# Patient Record
Sex: Female | Born: 1945 | Race: White | Hispanic: No | Marital: Married | Smoking: Never smoker
Health system: Southern US, Community
[De-identification: ages and names within clinical notes are randomized; demographics above are authoritative.]

## PROBLEM LIST (undated history)

## (undated) DIAGNOSIS — K219 Gastro-esophageal reflux disease without esophagitis: Secondary | ICD-10-CM

## (undated) DIAGNOSIS — J309 Allergic rhinitis, unspecified: Secondary | ICD-10-CM

## (undated) DIAGNOSIS — G709 Myoneural disorder, unspecified: Secondary | ICD-10-CM

## (undated) DIAGNOSIS — E559 Vitamin D deficiency, unspecified: Secondary | ICD-10-CM

## (undated) DIAGNOSIS — M179 Osteoarthritis of knee, unspecified: Secondary | ICD-10-CM

## (undated) DIAGNOSIS — N189 Chronic kidney disease, unspecified: Secondary | ICD-10-CM

## (undated) DIAGNOSIS — G43909 Migraine, unspecified, not intractable, without status migrainosus: Secondary | ICD-10-CM

## (undated) DIAGNOSIS — E785 Hyperlipidemia, unspecified: Secondary | ICD-10-CM

## (undated) DIAGNOSIS — F329 Major depressive disorder, single episode, unspecified: Secondary | ICD-10-CM

## (undated) DIAGNOSIS — M810 Age-related osteoporosis without current pathological fracture: Secondary | ICD-10-CM

## (undated) DIAGNOSIS — R251 Tremor, unspecified: Secondary | ICD-10-CM

## (undated) DIAGNOSIS — F32A Depression, unspecified: Secondary | ICD-10-CM

## (undated) DIAGNOSIS — M1710 Unilateral primary osteoarthritis, unspecified knee: Secondary | ICD-10-CM

## (undated) HISTORY — DX: Major depressive disorder, single episode, unspecified: F32.9

## (undated) HISTORY — DX: Hyperlipidemia, unspecified: E78.5

## (undated) HISTORY — DX: Age-related osteoporosis without current pathological fracture: M81.0

## (undated) HISTORY — PX: WRIST FRACTURE SURGERY: SHX121

## (undated) HISTORY — PX: OTHER SURGICAL HISTORY: SHX169

## (undated) HISTORY — DX: Osteoarthritis of knee, unspecified: M17.9

## (undated) HISTORY — DX: Unilateral primary osteoarthritis, unspecified knee: M17.10

## (undated) HISTORY — DX: Gastro-esophageal reflux disease without esophagitis: K21.9

## (undated) HISTORY — DX: Allergic rhinitis, unspecified: J30.9

## (undated) HISTORY — PX: KNEE SURGERY: SHX244

## (undated) HISTORY — DX: Vitamin D deficiency, unspecified: E55.9

## (undated) HISTORY — DX: Depression, unspecified: F32.A

## (undated) HISTORY — DX: Chronic kidney disease, unspecified: N18.9

---

## 1998-08-08 ENCOUNTER — Encounter: Payer: Self-pay | Admitting: Emergency Medicine

## 1998-08-08 ENCOUNTER — Emergency Department (HOSPITAL_COMMUNITY): Admission: EM | Admit: 1998-08-08 | Discharge: 1998-08-08 | Payer: Self-pay | Admitting: Emergency Medicine

## 1998-12-28 ENCOUNTER — Encounter: Admission: RE | Admit: 1998-12-28 | Discharge: 1998-12-28 | Payer: Self-pay | Admitting: Geriatric Medicine

## 1998-12-28 ENCOUNTER — Encounter: Payer: Self-pay | Admitting: Geriatric Medicine

## 1999-05-28 ENCOUNTER — Encounter: Admission: RE | Admit: 1999-05-28 | Discharge: 1999-05-28 | Payer: Self-pay | Admitting: Geriatric Medicine

## 1999-05-28 ENCOUNTER — Encounter: Payer: Self-pay | Admitting: Geriatric Medicine

## 1999-11-07 ENCOUNTER — Encounter: Admission: RE | Admit: 1999-11-07 | Discharge: 1999-11-07 | Payer: Self-pay | Admitting: *Deleted

## 2000-01-08 ENCOUNTER — Encounter: Admission: RE | Admit: 2000-01-08 | Discharge: 2000-01-08 | Payer: Self-pay | Admitting: Geriatric Medicine

## 2000-01-08 ENCOUNTER — Encounter: Payer: Self-pay | Admitting: Geriatric Medicine

## 2000-04-21 ENCOUNTER — Other Ambulatory Visit: Admission: RE | Admit: 2000-04-21 | Discharge: 2000-04-21 | Payer: Self-pay | Admitting: Geriatric Medicine

## 2001-01-09 ENCOUNTER — Encounter: Payer: Self-pay | Admitting: Geriatric Medicine

## 2001-01-09 ENCOUNTER — Encounter: Admission: RE | Admit: 2001-01-09 | Discharge: 2001-01-09 | Payer: Self-pay | Admitting: Geriatric Medicine

## 2002-02-01 ENCOUNTER — Encounter: Payer: Self-pay | Admitting: Geriatric Medicine

## 2002-02-01 ENCOUNTER — Encounter: Admission: RE | Admit: 2002-02-01 | Discharge: 2002-02-01 | Payer: Self-pay | Admitting: Geriatric Medicine

## 2002-05-03 ENCOUNTER — Other Ambulatory Visit: Admission: RE | Admit: 2002-05-03 | Discharge: 2002-05-03 | Payer: Self-pay | Admitting: Geriatric Medicine

## 2003-02-03 ENCOUNTER — Encounter: Admission: RE | Admit: 2003-02-03 | Discharge: 2003-02-03 | Payer: Self-pay | Admitting: Geriatric Medicine

## 2004-02-10 ENCOUNTER — Encounter: Admission: RE | Admit: 2004-02-10 | Discharge: 2004-02-10 | Payer: Self-pay | Admitting: Geriatric Medicine

## 2004-05-11 ENCOUNTER — Other Ambulatory Visit: Admission: RE | Admit: 2004-05-11 | Discharge: 2004-05-11 | Payer: Self-pay | Admitting: Geriatric Medicine

## 2005-02-20 ENCOUNTER — Encounter: Admission: RE | Admit: 2005-02-20 | Discharge: 2005-02-20 | Payer: Self-pay | Admitting: Geriatric Medicine

## 2006-02-26 ENCOUNTER — Encounter: Admission: RE | Admit: 2006-02-26 | Discharge: 2006-02-26 | Payer: Self-pay | Admitting: Geriatric Medicine

## 2006-06-11 ENCOUNTER — Encounter (HOSPITAL_COMMUNITY): Admission: RE | Admit: 2006-06-11 | Discharge: 2006-09-09 | Payer: Self-pay | Admitting: Endocrinology

## 2006-07-08 ENCOUNTER — Other Ambulatory Visit: Admission: RE | Admit: 2006-07-08 | Discharge: 2006-07-08 | Payer: Self-pay | Admitting: Geriatric Medicine

## 2006-11-12 ENCOUNTER — Encounter (HOSPITAL_COMMUNITY): Admission: RE | Admit: 2006-11-12 | Discharge: 2007-01-14 | Payer: Self-pay | Admitting: Endocrinology

## 2006-12-29 ENCOUNTER — Encounter: Admission: RE | Admit: 2006-12-29 | Discharge: 2006-12-29 | Payer: Self-pay | Admitting: Orthopedic Surgery

## 2007-02-23 ENCOUNTER — Encounter (HOSPITAL_COMMUNITY): Admission: RE | Admit: 2007-02-23 | Discharge: 2007-02-23 | Payer: Self-pay | Admitting: Endocrinology

## 2007-03-02 ENCOUNTER — Encounter: Admission: RE | Admit: 2007-03-02 | Discharge: 2007-03-02 | Payer: Self-pay | Admitting: Geriatric Medicine

## 2007-06-24 ENCOUNTER — Encounter: Admission: RE | Admit: 2007-06-24 | Discharge: 2007-06-24 | Payer: Self-pay | Admitting: Orthopaedic Surgery

## 2007-12-10 ENCOUNTER — Ambulatory Visit (HOSPITAL_COMMUNITY): Admission: RE | Admit: 2007-12-10 | Discharge: 2007-12-10 | Payer: Self-pay | Admitting: Orthopedic Surgery

## 2008-03-02 ENCOUNTER — Encounter: Admission: RE | Admit: 2008-03-02 | Discharge: 2008-03-02 | Payer: Self-pay | Admitting: Geriatric Medicine

## 2008-09-14 ENCOUNTER — Other Ambulatory Visit: Admission: RE | Admit: 2008-09-14 | Discharge: 2008-09-14 | Payer: Self-pay | Admitting: Geriatric Medicine

## 2009-03-15 ENCOUNTER — Encounter: Admission: RE | Admit: 2009-03-15 | Discharge: 2009-03-15 | Payer: Self-pay | Admitting: Geriatric Medicine

## 2009-07-12 ENCOUNTER — Ambulatory Visit (HOSPITAL_BASED_OUTPATIENT_CLINIC_OR_DEPARTMENT_OTHER): Admission: RE | Admit: 2009-07-12 | Discharge: 2009-07-12 | Payer: Self-pay | Admitting: General Surgery

## 2009-07-18 ENCOUNTER — Encounter: Admission: RE | Admit: 2009-07-18 | Discharge: 2009-07-18 | Payer: Self-pay | Admitting: General Surgery

## 2009-11-22 ENCOUNTER — Other Ambulatory Visit: Admission: RE | Admit: 2009-11-22 | Discharge: 2009-11-22 | Payer: Self-pay | Admitting: Geriatric Medicine

## 2010-03-04 ENCOUNTER — Encounter: Payer: Self-pay | Admitting: Orthopedic Surgery

## 2010-03-08 ENCOUNTER — Other Ambulatory Visit: Payer: Self-pay | Admitting: Gastroenterology

## 2010-04-06 ENCOUNTER — Other Ambulatory Visit: Payer: Self-pay | Admitting: Geriatric Medicine

## 2010-04-06 DIAGNOSIS — Z1239 Encounter for other screening for malignant neoplasm of breast: Secondary | ICD-10-CM

## 2010-04-09 ENCOUNTER — Ambulatory Visit
Admission: RE | Admit: 2010-04-09 | Discharge: 2010-04-09 | Disposition: A | Discharge status: Home / Self Care | Payer: MEDICARE | Source: Ambulatory Visit | Attending: Geriatric Medicine | Admitting: Geriatric Medicine

## 2010-04-09 DIAGNOSIS — Z1239 Encounter for other screening for malignant neoplasm of breast: Secondary | ICD-10-CM

## 2010-04-30 LAB — BASIC METABOLIC PANEL
BUN: 10 mg/dL (ref 6–23)
CO2: 29 mEq/L (ref 19–32)
Calcium: 9.2 mg/dL (ref 8.4–10.5)
Chloride: 107 mEq/L (ref 96–112)
Creatinine, Ser: 0.93 mg/dL (ref 0.4–1.2)
GFR calc Af Amer: >60 mL/min (ref >60)
GFR calc non Af Amer: >60 mL/min (ref >60)
Glucose, Bld: 113 mg/dL — ABNORMAL HIGH (ref 70–99)
Potassium: 3.8 mEq/L (ref 3.5–5.1)
Sodium: 141 mEq/L (ref 135–145)

## 2010-04-30 LAB — DIFFERENTIAL
Basophils Absolute: 0.1 10*3/uL (ref 0.0–0.1)
Basophils Relative: 1 % (ref 0–1)
Eosinophils Absolute: 0.5 10*3/uL (ref 0.0–0.7)
Eosinophils Relative: 7 % — ABNORMAL HIGH (ref 0–5)
Lymphocytes Relative: 35 % (ref 12–46)
Lymphs Abs: 2.5 10*3/uL (ref 0.7–4.0)
Monocytes Absolute: 0.8 10*3/uL (ref 0.1–1.0)
Monocytes Relative: 11 % (ref 3–12)
Neutro Abs: 3.3 10*3/uL (ref 1.7–7.7)
Neutrophils Relative %: 47 % (ref 43–77)

## 2010-04-30 LAB — CBC
HCT: 38.5 % (ref 36.0–46.0)
Hemoglobin: 12.6 g/dL (ref 12.0–15.0)
MCHC: 32.7 g/dL (ref 30.0–36.0)
MCV: 90.9 fL (ref 78.0–100.0)
Platelets: 242 10*3/uL (ref 150–400)
RBC: 4.24 MIL/uL (ref 3.87–5.11)
RDW: 14.4 % (ref 11.5–15.5)
WBC: 7.1 10*3/uL (ref 4.0–10.5)

## 2011-04-23 ENCOUNTER — Other Ambulatory Visit: Payer: Self-pay | Admitting: Geriatric Medicine

## 2011-04-23 DIAGNOSIS — Z1231 Encounter for screening mammogram for malignant neoplasm of breast: Secondary | ICD-10-CM

## 2011-04-30 ENCOUNTER — Ambulatory Visit
Admission: RE | Admit: 2011-04-30 | Discharge: 2011-04-30 | Disposition: A | Discharge status: Home / Self Care | Payer: PRIVATE HEALTH INSURANCE | Source: Ambulatory Visit | Attending: Geriatric Medicine | Admitting: Geriatric Medicine

## 2011-04-30 DIAGNOSIS — Z1231 Encounter for screening mammogram for malignant neoplasm of breast: Secondary | ICD-10-CM

## 2012-04-15 ENCOUNTER — Other Ambulatory Visit: Payer: Self-pay | Admitting: Geriatric Medicine

## 2012-04-17 ENCOUNTER — Other Ambulatory Visit: Payer: PRIVATE HEALTH INSURANCE

## 2012-04-19 ENCOUNTER — Other Ambulatory Visit: Payer: PRIVATE HEALTH INSURANCE

## 2012-04-20 ENCOUNTER — Ambulatory Visit
Admission: RE | Admit: 2012-04-20 | Discharge: 2012-04-20 | Disposition: A | Discharge status: Home / Self Care | Payer: 59 | Source: Ambulatory Visit | Attending: Geriatric Medicine | Admitting: Geriatric Medicine

## 2012-04-20 MED ORDER — GADOBENATE DIMEGLUMINE 529 MG/ML IV SOLN
13.0000 mL | Freq: Once | INTRAVENOUS | Status: AC | PRN
  Administered 2012-04-20: 13 mL via INTRAVENOUS

## 2012-05-01 ENCOUNTER — Emergency Department (HOSPITAL_COMMUNITY): Payer: Medicare Other

## 2012-05-01 ENCOUNTER — Observation Stay (HOSPITAL_COMMUNITY)
Admission: EM | Admit: 2012-05-01 | Discharge: 2012-05-03 | Disposition: A | Discharge status: Home Health | Payer: Medicare Other | Attending: Internal Medicine | Admitting: Internal Medicine

## 2012-05-01 DIAGNOSIS — I6789 Other cerebrovascular disease: Secondary | ICD-10-CM | POA: Insufficient documentation

## 2012-05-01 DIAGNOSIS — R4789 Other speech disturbances: Secondary | ICD-10-CM | POA: Insufficient documentation

## 2012-05-01 DIAGNOSIS — R269 Unspecified abnormalities of gait and mobility: Secondary | ICD-10-CM | POA: Insufficient documentation

## 2012-05-01 DIAGNOSIS — R4701 Aphasia: Secondary | ICD-10-CM | POA: Insufficient documentation

## 2012-05-01 DIAGNOSIS — R4182 Altered mental status, unspecified: Secondary | ICD-10-CM

## 2012-05-01 DIAGNOSIS — G43109 Migraine with aura, not intractable, without status migrainosus: Principal | ICD-10-CM | POA: Insufficient documentation

## 2012-05-01 DIAGNOSIS — G43909 Migraine, unspecified, not intractable, without status migrainosus: Secondary | ICD-10-CM

## 2012-05-01 DIAGNOSIS — G9340 Encephalopathy, unspecified: Secondary | ICD-10-CM | POA: Insufficient documentation

## 2012-05-01 DIAGNOSIS — G250 Essential tremor: Secondary | ICD-10-CM | POA: Insufficient documentation

## 2012-05-01 DIAGNOSIS — G252 Other specified forms of tremor: Secondary | ICD-10-CM | POA: Insufficient documentation

## 2012-05-01 DIAGNOSIS — G459 Transient cerebral ischemic attack, unspecified: Secondary | ICD-10-CM

## 2012-05-01 DIAGNOSIS — F410 Panic disorder [episodic paroxysmal anxiety] without agoraphobia: Secondary | ICD-10-CM | POA: Diagnosis present

## 2012-05-01 HISTORY — DX: Migraine, unspecified, not intractable, without status migrainosus: G43.909

## 2012-05-01 LAB — BASIC METABOLIC PANEL
CO2: 23 mEq/L (ref 19–32)
Calcium: 9.6 mg/dL (ref 8.4–10.5)
Creatinine, Ser: 1.03 mg/dL (ref 0.50–1.10)
Glucose, Bld: 168 mg/dL — ABNORMAL HIGH (ref 70–99)

## 2012-05-01 LAB — CBC
Hemoglobin: 14.6 g/dL (ref 12.0–15.0)
MCH: 29.9 pg (ref 26.0–34.0)
MCV: 86.1 fL (ref 78.0–100.0)
RBC: 4.89 MIL/uL (ref 3.87–5.11)

## 2012-05-01 MED ORDER — SODIUM CHLORIDE 0.9 % IV SOLN
INTRAVENOUS | Status: DC
  Administered 2012-05-02 (×2): via INTRAVENOUS

## 2012-05-01 MED ORDER — METOCLOPRAMIDE HCL 5 MG/ML IJ SOLN
10.0000 mg | Freq: Once | INTRAMUSCULAR | Status: AC
  Administered 2012-05-02: 10 mg via INTRAVENOUS
  Filled 2012-05-01: qty 2

## 2012-05-01 NOTE — ED Notes (Signed)
Patient transported to CT 

## 2012-05-01 NOTE — ED Provider Notes (Signed)
History     CSN: [id number]  Arrival date & time 05/01/12  2238   First MD Initiated Contact with Patient 05/01/12 2245      Chief Complaint  Patient presents with  . Code Stroke    (Consider location/radiation/quality/duration/timing/severity/associated sxs/prior treatment) The history is provided by the patient and the spouse.  Jennifer Estrada is a 67 y.o. female here as code stroke. She had some headache this afternoon. Last normal was 8 PM last night. Around that time she developed some slurred speech and unable to get words out. Her EMS she had expressive aphasia and diffuse weakness. Patient unable to answer any questions due to aphasia. No history of strokes in the past.   Level V caveat- aphasia    No past medical history on file.  No past surgical history on file.  No family history on file.  History  Substance Use Topics  . Smoking status: Not on file  . Smokeless tobacco: Not on file  . Alcohol Use: Not on file    OB History   No data available      Review of Systems  Unable to perform ROS: Acuity of condition    Allergies  Penicillins  Home Medications   Current Outpatient Rx  Name  Route  Sig  Dispense  Refill  . fexofenadine-pseudoephedrine (ALLEGRA-D 24) 180-240 MG per 24 hr tablet   Oral   Take 1 tablet by mouth daily.           BP 149/87  Pulse 101  Resp 24  SpO2 100%  Physical Exam  Nursing note and vitals reviewed. Constitutional:  Aphasic, uncomfortable   HENT:  Head: Normocephalic.  Mouth/Throat: Oropharynx is clear and moist.  Eyes: Pupils are equal, round, and reactive to light.  Eye closed, no eye deviation. Pupils equal and reactive   Neck: Normal range of motion. Neck supple.  Cardiovascular: Normal rate, regular rhythm and normal heart sounds.   Pulmonary/Chest: Effort normal and breath sounds normal. No respiratory distress. She has no wheezes. She has no rales.  Abdominal: Soft. Bowel sounds are normal. She  exhibits no distension. There is no tenderness. There is no rebound.  Musculoskeletal: Normal range of motion.  Neurological: She is alert.  + expressive aphasia. ? R arm weakness compared to L.   Skin: Skin is warm and dry.  Psychiatric:  Unable     ED Course  Procedures (including critical care time)  Labs Reviewed  BASIC METABOLIC PANEL - Abnormal; Notable for the following:    Potassium 3.4 (*)    Chloride 95 (*)    Glucose, Bld 168 (*)    GFR calc non Af Amer 55 (*)    GFR calc Af Amer 64 (*)    All other components within normal limits  CBC  PROTIME-INR  APTT  TROPONIN I  URINALYSIS, ROUTINE W REFLEX MICROSCOPIC   Ct Head Wo Contrast  05/01/2012  *RADIOLOGY REPORT*  Clinical Data: 67 year old female - code stroke with unsteady gait and slurred speech.  CT HEAD WITHOUT CONTRAST  Technique:  Contiguous axial images were obtained from the base of the skull through the vertex without contrast.  Comparison: 04/20/2012 MRI  Findings:   Chronic small vessel white matter ischemic changes are again noted.  No acute intracranial abnormalities are identified, including mass lesion or mass effect, hydrocephalus, extra-axial fluid collection, midline shift, hemorrhage, or acute infarction.  The visualized bony calvarium is unremarkable.  IMPRESSION: No evidence of acute intracranial  abnormality.  Chronic small vessel white matter ischemic changes.  Critical Value/emergent results were called by telephone at the time of interpretation on 05/01/2012 at 10:55 p.m. to Dr. Roseanne Reno, who verbally acknowledged these results.   Original Report Authenticated By: Harmon Pier, M.D.    Mr [facility] Wo Contrast  05/02/2012  *RADIOLOGY REPORT*  Clinical Data:  Code stroke.  Headache and confusion  MRI HEAD WITHOUT CONTRAST MRA HEAD WITHOUT CONTRAST  Technique:  Multiplanar, multiecho pulse sequences of the brain and surrounding structures were obtained without intravenous contrast. Angiographic images of the  head were obtained using MRA technique without contrast.  Comparison:  CT 05/01/2012  MRI HEAD  Findings:  The study was limited to diffusion weighted imaging only.  The patient was not able to hold still and there is mild motion degrading image quality.  Negative for acute infarct.  There is artifact on the study from motion.  Small acute infarct would be difficult to identify the study.  IMPRESSION: Image quality degraded by motion.  No acute infarct.  MRA HEAD  Findings: The patient could not hold  still.  Motion degrades image quality.  Fetal origin of the posterior cerebral artery bilaterally with hypoplastic distal basilar artery.  The distal basilar ends in the superior cerebellar arteries.  The distal basilar is patulous measuring 3 mm in diameter.  This could be an aneurysm.  Both vertebral arteries are patent to the basilar.  The basilar is patent.  The posterior cerebral arteries are patent bilaterally.  Anterior circulation is difficult to evaluate due to motion.  This area was repeated.  The   cavernous carotid is irregular with atherosclerotic disease.  The anterior and middle cerebral arteries are patent bilaterally.  There is irregularity of the anterior cerebral arteries and middle cerebral artery bilaterally which is most likely due to atherosclerotic disease.  No large vessel occlusion.  IMPRESSION: Fetal   circulation of the posterior circulation.  Image quality degraded by patient motion.  The distal basilar ends in the superior cerebellar arteries.  The distal basilar is patulous and there may be a 3 mm aneurysm.  Atherosclerotic irregularity in the anterior middle cerebral arteries.  No large vessel occlusion.   Original Report Authenticated By: Janeece Riggers, M.D.      No diagnosis found.   Date: 05/01/2012  Rate: 65  Rhythm: normal sinus rhythm  QRS Axis: normal  Intervals: normal  ST/T Wave abnormalities: nonspecific ST changes  Conduction Disutrbances:none  Narrative  Interpretation:   Old EKG Reviewed: unchanged    MDM  Jennifer Estrada is a 67 y.o. female here with expressive aphasia. Neuro at bedside. DDx includes stroke vs complicated migraines. Dr. Roseanne Reno from neuro felt given nl CT head, will get MRI head to r/o stroke. If stroke on MRI, will give TPA using the extended window. He doesn't want to give TPA right now given patient may have complicated migraine. Will reassess after MRI.   12:23 AM MRi showed no stroke. Patient still altered. Will admit for AMS. Neuro doesn't want TPA and recommend admission to monitor mental status.        Richardean Canal, MD 05/02/12 [phone]

## 2012-05-01 NOTE — ED Notes (Signed)
Pt vomited in hallway coming back from CT.  PT cleaned and Neurologist made aware.  Neurologist in room with pt

## 2012-05-01 NOTE — ED Notes (Addendum)
Per EMS, pt husband states wife was last seen normal at 2000.  Pt now presents with right side weakness, confusion and inability to follow directions.  Pt is moving right side under own power but weakness to side noted lower than left side.  Pt aware of name, but can not repeat birthday, or follow commands.  Per husband only medical history of GERD.  EMS stated pt has complained of a HA for 2-3 months, worsening tonight.

## 2012-05-01 NOTE — ED Notes (Signed)
MD at bedside. [facility]

## 2012-05-02 ENCOUNTER — Emergency Department (HOSPITAL_COMMUNITY): Payer: Medicare Other

## 2012-05-02 ENCOUNTER — Encounter (HOSPITAL_COMMUNITY): Payer: Self-pay | Admitting: Emergency Medicine

## 2012-05-02 ENCOUNTER — Observation Stay (HOSPITAL_COMMUNITY): Payer: Medicare Other

## 2012-05-02 DIAGNOSIS — R51 Headache: Secondary | ICD-10-CM

## 2012-05-02 DIAGNOSIS — G459 Transient cerebral ischemic attack, unspecified: Secondary | ICD-10-CM | POA: Insufficient documentation

## 2012-05-02 DIAGNOSIS — R4182 Altered mental status, unspecified: Secondary | ICD-10-CM

## 2012-05-02 DIAGNOSIS — G43909 Migraine, unspecified, not intractable, without status migrainosus: Secondary | ICD-10-CM | POA: Diagnosis present

## 2012-05-02 DIAGNOSIS — G43109 Migraine with aura, not intractable, without status migrainosus: Secondary | ICD-10-CM

## 2012-05-02 DIAGNOSIS — F410 Panic disorder [episodic paroxysmal anxiety] without agoraphobia: Secondary | ICD-10-CM | POA: Diagnosis present

## 2012-05-02 LAB — GLUCOSE, CAPILLARY
Glucose-Capillary: 128 mg/dL — ABNORMAL HIGH (ref 70–99)
Glucose-Capillary: 70 mg/dL (ref 70–99)
Glucose-Capillary: 72 mg/dL (ref 70–99)
Glucose-Capillary: 78 mg/dL (ref 70–99)
Glucose-Capillary: 96 mg/dL (ref 70–99)

## 2012-05-02 LAB — CBC
HCT: 40.4 % (ref 36.0–46.0)
MCH: 30.2 pg (ref 26.0–34.0)
MCV: 87.1 fL (ref 78.0–100.0)
Platelets: 207 10*3/uL (ref 150–400)
RDW: 13.1 % (ref 11.5–15.5)

## 2012-05-02 LAB — HEMOGLOBIN A1C
Hgb A1c MFr Bld: 5.8 % — ABNORMAL HIGH (ref <5.7)
Mean Plasma Glucose: 120 mg/dL — ABNORMAL HIGH (ref <117)

## 2012-05-02 LAB — LIPID PANEL
LDL Cholesterol: 133 mg/dL — ABNORMAL HIGH (ref 0–99)
Triglycerides: 54 mg/dL (ref <150)
VLDL: 11 mg/dL (ref 0–40)

## 2012-05-02 LAB — RAPID URINE DRUG SCREEN, HOSP PERFORMED
Amphetamines: NOT DETECTED
Benzodiazepines: NOT DETECTED
Opiates: NOT DETECTED

## 2012-05-02 LAB — CSF CELL COUNT WITH DIFFERENTIAL
RBC Count, CSF: 1 /mm3 — ABNORMAL HIGH
Tube #: 1
WBC, CSF: 1 /mm3 (ref 0–5)

## 2012-05-02 LAB — CRYPTOCOCCAL ANTIGEN, CSF: Crypto Ag: NEGATIVE

## 2012-05-02 LAB — URINALYSIS, ROUTINE W REFLEX MICROSCOPIC
Glucose, UA: NEGATIVE mg/dL
Leukocytes, UA: NEGATIVE
Specific Gravity, Urine: 1.015 (ref 1.005–1.030)
pH: 8.5 — ABNORMAL HIGH (ref 5.0–8.0)

## 2012-05-02 LAB — PROTEIN AND GLUCOSE, CSF: Glucose, CSF: 62 mg/dL (ref 43–76)

## 2012-05-02 MED ORDER — HYDROMORPHONE HCL PF 1 MG/ML IJ SOLN
1.0000 mg | INTRAMUSCULAR | Status: DC | PRN
  Administered 2012-05-02 (×2): 1 mg via INTRAVENOUS
  Filled 2012-05-02 (×2): qty 1

## 2012-05-02 MED ORDER — VALPROATE SODIUM 500 MG/5ML IV SOLN
1000.0000 mg | Freq: Once | INTRAVENOUS | Status: AC
  Administered 2012-05-02: 1000 mg via INTRAVENOUS
  Filled 2012-05-02: qty 10

## 2012-05-02 MED ORDER — DIPHENHYDRAMINE HCL 50 MG/ML IJ SOLN
25.0000 mg | Freq: Once | INTRAMUSCULAR | Status: AC
  Administered 2012-05-02: 25 mg via INTRAVENOUS
  Filled 2012-05-02: qty 1

## 2012-05-02 MED ORDER — ENOXAPARIN SODIUM 40 MG/0.4ML ~~LOC~~ SOLN
40.0000 mg | Freq: Every day | SUBCUTANEOUS | Status: DC
  Filled 2012-05-02: qty 0.4

## 2012-05-02 MED ORDER — MAGNESIUM SULFATE 40 MG/ML IJ SOLN
2.0000 g | Freq: Once | INTRAMUSCULAR | Status: AC
  Administered 2012-05-02: 2 g via INTRAVENOUS
  Filled 2012-05-02: qty 50

## 2012-05-02 MED ORDER — LORAZEPAM 2 MG/ML IJ SOLN
INTRAMUSCULAR | Status: AC
  Filled 2012-05-02: qty 1

## 2012-05-02 MED ORDER — SUMATRIPTAN 20 MG/ACT NA SOLN: 1.0000 | NASAL | Status: DC | PRN

## 2012-05-02 MED ORDER — PROCHLORPERAZINE EDISYLATE 5 MG/ML IJ SOLN
10.0000 mg | Freq: Four times a day (QID) | INTRAMUSCULAR | Status: DC | PRN
  Filled 2012-05-02: qty 2

## 2012-05-02 MED ORDER — SUMATRIPTAN 20 MG/ACT NA SOLN
20.0000 mg | NASAL | Status: DC | PRN
  Filled 2012-05-02: qty 1

## 2012-05-02 MED ORDER — ASPIRIN 325 MG PO TABS
325.0000 mg | ORAL_TABLET | Freq: Every day | ORAL | Status: DC
  Administered 2012-05-02: 325 mg via ORAL
  Filled 2012-05-02 (×2): qty 1

## 2012-05-02 MED ORDER — PROCHLORPERAZINE EDISYLATE 5 MG/ML IJ SOLN
10.0000 mg | Freq: Once | INTRAMUSCULAR | Status: AC
  Administered 2012-05-02: 10 mg via INTRAVENOUS
  Filled 2012-05-02: qty 2

## 2012-05-02 MED ORDER — DIVALPROEX SODIUM 250 MG PO DR TAB: 250.0000 mg | DELAYED_RELEASE_TABLET | Freq: Three times a day (TID) | ORAL | Status: DC

## 2012-05-02 MED ORDER — KETOROLAC TROMETHAMINE 30 MG/ML IJ SOLN
30.0000 mg | Freq: Once | INTRAMUSCULAR | Status: AC
  Administered 2012-05-02: 30 mg via INTRAVENOUS
  Filled 2012-05-02: qty 1

## 2012-05-02 MED ORDER — PROCHLORPERAZINE MALEATE 10 MG PO TABS: 10.0000 mg | ORAL_TABLET | Freq: Four times a day (QID) | ORAL | Status: DC | PRN

## 2012-05-02 MED ORDER — LIDOCAINE HCL (PF) 1 % IJ SOLN
INTRAMUSCULAR | Status: AC
  Filled 2012-05-02: qty 5

## 2012-05-02 NOTE — Procedures (Signed)
Indication: Altered mental status  Risks of the procedure were dicussed with the patient including post-LP headache, bleeding, infection, weakness/numbness of legs(radiculopathy), death.  The husband agreed and written consent was obtained.   The patient was prepped and draped, and using sterile technique a 20 gauge quinke spinal needle was inserted in the L4-L5 and subsequently L5-S1 space. Despite repeated attempts, no CSF return was obtained. On the last pass, frankly bloody return that appeared to be at venous pressure was obtained.   I have discussed with radiology and an LP under IR will be performed.

## 2012-05-02 NOTE — Care Management Note (Unsigned)
    Page 1 of 1   05/02/2012     8:56:33 AM   CARE MANAGEMENT NOTE 05/02/2012  Patient:  Jennifer Estrada, Jennifer Estrada   Account Number:  [id number]  Date Initiated:  05/02/2012  Documentation initiated by:  GRAVES-BIGELOW,Taetum  Subjective/Objective Assessment:   Pt admitted with confusion and slurred speech. Pt is form home with husband.     Action/Plan:   CM will continue to monitor for disposition needs.   Anticipated DC Date:  05/03/2012   Anticipated DC Plan:  HOME/SELF CARE      DC Planning Services  CM consult      Choice offered to / List presented to:             Status of service:  In process, will continue to follow Medicare Important Message given?   (If response is "NO", the following Medicare IM given date fields will be blank) Date Medicare IM given:   Date Additional Medicare IM given:    Discharge Disposition:    Per UR Regulation:  Reviewed for med. necessity/level of care/duration of stay  If discussed at Long Length of Stay Meetings, dates discussed:    Comments:

## 2012-05-02 NOTE — Code Documentation (Addendum)
Patient complained of headache around 8pm, husband states she then became confused and started moaning and mumbling. EMS was called and EMS stated she had an unsteady gait and mumbling speech. Patient has been complaining of headaches on and off for the past 3 months per the patient's husband. Code stroke called at 2226, patient arrived at 2238, EDP exam at 2239, stroke team arrived at 2220, LSN 2000, patient arrived in CT at 2242, phlebotomist arrived at 2240, CT read by Dr. Roseanne Reno at 2245. Patient taken to MRI at 2315. Initial NIH 07, aphasia noted at times, code stroke cancelled per Dr. Roseanne Reno at [facility]

## 2012-05-02 NOTE — Procedures (Signed)
History: 68 yo F with altered mental status.   Background: There is a well defined posterior dominant rhythm of 9 Hz that attenuates with eye opening. She has some irregular delta activity that occurs throughout the recording superimposed on a background of alpha .  Photic stimulation: Physiologic driving is not performed  EEG Abnormalities: 1) Irregular slow activity.   Clinical Interpretation: This abnormal EEG is recorded in the waking and drowsy state. There is evidence of a mild generalized non-specific cerebral dysfunction(encephalopathy).  There was no seizure or seizure predisposition recorded on this study.   Ritta Slot, MD [facility] [phone]  If 7pm- 7am, please page neurology on call at [phone].

## 2012-05-02 NOTE — Progress Notes (Signed)
Clean catch urine to lab with patient cooperating and voiding in clean bedpan.  Continues to c/o headache mid frontal, denies nausea, is light sensitive.  Spouse at bedside and oriented to unit, precautions and orders.  Etter Sjogren RN

## 2012-05-02 NOTE — Progress Notes (Signed)
[facility] PROGRESS NOTE  Assessment/Plan: Migraine syndrome - Pt very sluggish on her response. - MRI negative. - No fever, no increase in WBC count, agree with EEG by neuro. - no electrolytes abnormality, CXR shows no infiltrate, - Plt's WNL. INR ,1.5, Cr at baseline.  - Hold anticoagulation. - UDS negative.     Code Status: full Family Communication: husband  Disposition Plan: inpatient.   Consultants:  neurology  Procedures:  MRI  EEG  Antibiotics:  None  HPI/Subjective: Pt relates her pain is better. Is sluggish on her response.  Objective: Filed Vitals:   05/02/12 0030 05/02/12 0100 05/02/12 0154 05/02/12 0546  BP: 146/79 123/86 148/93 159/98  Pulse: 85 90 87 89  Temp:   97.4 F (36.3 C) 98.3 F (36.8 C)  TempSrc:   Oral Oral  Resp: 25 20 20 20   Height:   5\' 6"  (1.676 m)   Weight:   68.8 kg (151 lb 10.8 oz)   SpO2: 100% 100% 100% 99%    Intake/Output Summary (Last 24 hours) at 05/02/12 1011 Last data filed at 05/02/12 0600  Gross per 24 hour  Intake 568.33 ml  Output    400 ml  Net 168.33 ml   Filed Weights   05/02/12 0154  Weight: 68.8 kg (151 lb 10.8 oz)    Exam:  General: Alert, awake, oriented x3, islow in movements and response.  HEENT: No bruits, no goiter.  Heart: Regular rate and rhythm, without murmurs, rubs, gallops.  Lungs: Good air movement, bilateral air movement.  Abdomen: Soft, nontender, nondistended, positive bowel sounds.  Neuro: able to move all 4 extremities without any difficulties.   Data Reviewed: Basic Metabolic Panel:  Recent Labs Lab 05/01/12 2255 05/02/12 0244  NA 135  --   K 3.4*  --   CL 95*  --   CO2 23  --   GLUCOSE 168*  --   BUN 19  --   CREATININE 1.03 0.85  CALCIUM 9.6  --    Liver Function Tests: No results found for this basename: AST, ALT, ALKPHOS, BILITOT, PROT, ALBUMIN,  in the last 168 hours No results found for this basename: LIPASE, AMYLASE,  in the last 168 hours No  results found for this basename: AMMONIA,  in the last 168 hours CBC:  Recent Labs Lab 05/01/12 2255 05/02/12 0244  WBC 9.2 10.2  HGB 14.6 14.0  HCT 42.1 40.4  MCV 86.1 87.1  PLT 227 207   Cardiac Enzymes:  Recent Labs Lab 05/01/12 2257  TROPONINI <0.30   BNP (last 3 results) No results found for this basename: PROBNP,  in the last 8760 hours CBG:  Recent Labs Lab 05/02/12 0631  GLUCAP 128*    No results found for this or any previous visit (from the past 240 hour(s)).   Studies: Ct Head Wo Contrast  05/01/2012  *RADIOLOGY REPORT*  Clinical Data: 67 year old female - code stroke with unsteady gait and slurred speech.  CT HEAD WITHOUT CONTRAST  Technique:  Contiguous axial images were obtained from the base of the skull through the vertex without contrast.  Comparison: 04/20/2012 MRI  Findings:   Chronic small vessel white matter ischemic changes are again noted.  No acute intracranial abnormalities are identified, including mass lesion or mass effect, hydrocephalus, extra-axial fluid collection, midline shift, hemorrhage, or acute infarction.  The visualized bony calvarium is unremarkable.  IMPRESSION: No evidence of acute intracranial abnormality.  Chronic small vessel white matter ischemic changes.  Critical Value/emergent  results were called by telephone at the time of interpretation on 05/01/2012 at 10:55 p.m. to Dr. Roseanne Reno, who verbally acknowledged these results.   Original Report Authenticated By: Harmon Pier, M.D.    Mr [facility] Wo Contrast  05/02/2012  *RADIOLOGY REPORT*  Clinical Data:  Code stroke.  Headache and confusion  MRI HEAD WITHOUT CONTRAST MRA HEAD WITHOUT CONTRAST  Technique:  Multiplanar, multiecho pulse sequences of the brain and surrounding structures were obtained without intravenous contrast. Angiographic images of the head were obtained using MRA technique without contrast.  Comparison:  CT 05/01/2012  MRI HEAD  Findings:  The study was limited to  diffusion weighted imaging only.  The patient was not able to hold still and there is mild motion degrading image quality.  Negative for acute infarct.  There is artifact on the study from motion.  Small acute infarct would be difficult to identify the study.  IMPRESSION: Image quality degraded by motion.  No acute infarct.  MRA HEAD  Findings: The patient could not hold  still.  Motion degrades image quality.  Fetal origin of the posterior cerebral artery bilaterally with hypoplastic distal basilar artery.  The distal basilar ends in the superior cerebellar arteries.  The distal basilar is patulous measuring 3 mm in diameter.  This could be an aneurysm.  Both vertebral arteries are patent to the basilar.  The basilar is patent.  The posterior cerebral arteries are patent bilaterally.  Anterior circulation is difficult to evaluate due to motion.  This area was repeated.  The   cavernous carotid is irregular with atherosclerotic disease.  The anterior and middle cerebral arteries are patent bilaterally.  There is irregularity of the anterior cerebral arteries and middle cerebral artery bilaterally which is most likely due to atherosclerotic disease.  No large vessel occlusion.  IMPRESSION: Fetal   circulation of the posterior circulation.  Image quality degraded by patient motion.  The distal basilar ends in the superior cerebellar arteries.  The distal basilar is patulous and there may be a 3 mm aneurysm.  Atherosclerotic irregularity in the anterior middle cerebral arteries.  No large vessel occlusion.   Original Report Authenticated By: Janeece Riggers, M.D.    Dg Chest Portable 1 View  05/02/2012  *RADIOLOGY REPORT*  Clinical Data: Slurred speech and weakness.  PORTABLE CHEST - 1 VIEW  Comparison: 07/01/2010  Findings: The cardiomediastinal silhouette is unremarkable. There is no evidence of focal airspace disease, pulmonary edema, suspicious pulmonary nodule/mass, pleural effusion, or pneumothorax. No acute bony  abnormalities are identified.  IMPRESSION: No evidence of active cardiopulmonary disease.   Original Report Authenticated By: Harmon Pier, M.D.     Scheduled Meds: . enoxaparin (LOVENOX) injection  40 mg Subcutaneous Daily   Continuous Infusions:    Marinda Elk  [facility] Pager [phone]. If 8PM-8AM, please contact night-coverage at [email or web address], password [email or web address] 05/02/2012, 10:11 AM  LOS: 1 day

## 2012-05-02 NOTE — H&P (Signed)
[facility] History and Physical  Jennifer Estrada ZOX:[id number] DOB: [birth year]    PCP:   Ginette Otto, MD   Chief Complaint: Headache, slurred speech, and confusion.  HPI: Jennifer Estrada is an 67 y.o. female with hx of migraine and seasonal allergy presents to the ER as a code stroke four hours after ictus of confusion, slurred speech, and headaches.  Code stroke was subsequently cancelled.  Over the course of her ER stay, she began to have improvement of her symptoms.  Work up in the ER showed MRI/MRA which are negative for acute CVA or any large vessel stenosis.  Her serology was negative except for mild elevation of her BS.  Dr Roseanne Reno of neurology has seen her in the ER and felt that this may represent complex migraine.  He recommended that she be admitted to complete the TIA work up.   Rewiew of Systems:  Constitutional: Negative for malaise, fever and chills. No significant weight loss or weight gain Eyes: Negative for eye pain, redness and discharge, diplopia, visual changes, or flashes of light. ENMT: Negative for ear pain, hoarseness, nasal congestion, sinus pressure and sore throat. No headaches; tinnitus, drooling, or problem swallowing. Cardiovascular: Negative for chest pain, palpitations, diaphoresis, dyspnea and peripheral edema. ; No orthopnea, PND Respiratory: Negative for cough, hemoptysis, wheezing and stridor. No pleuritic chestpain. Gastrointestinal: Negative for nausea, vomiting, diarrhea, constipation, abdominal pain, melena, blood in stool, hematemesis, jaundice and rectal bleeding.    Genitourinary: Negative for frequency, dysuria, incontinence,flank pain and hematuria; Musculoskeletal: Negative for back pain and neck pain. Negative for swelling and trauma.;  Skin: . Negative for pruritus, rash, abrasions, bruising and skin lesion.; ulcerations Neuro: Negative for lightheadedness and neck stiffness. Negative for weakness, altered level of  consciousness , extremity weakness, burning feet, involuntary movement, seizure and syncope.  Psych: negative for  depression, insomnia, tearfulness, panic attacks, hallucinations, paranoia, suicidal or homicidal ideation    Past Medical History  Diagnosis Date  . Migraine     No past surgical history on file.  Medications:  HOME MEDS: Prior to Admission medications   Medication Sig Start Date End Date Taking? Authorizing Provider  fexofenadine-pseudoephedrine (ALLEGRA-D 24) 180-240 MG per 24 hr tablet Take 1 tablet by mouth daily.   Yes Historical Provider, MD     Allergies:  Allergies  Allergen Reactions  . Penicillins     Social History:   has no tobacco, alcohol, and drug history on file.  Family History: No family history on file.   Physical Exam: Filed Vitals:   05/01/12 2315 05/02/12 0030 05/02/12 0100 05/02/12 0154  BP: 149/87 146/79 123/86 148/93  Pulse:  85 90 87  Temp:    97.4 F (36.3 C)  TempSrc:    Oral  Resp: 24 25 20 20   Height:    5\' 6"  (1.676 m)  Weight:    68.8 kg (151 lb 10.8 oz)  SpO2:  100% 100% 100%   Blood pressure 148/93, pulse 87, temperature 97.4 F (36.3 C), temperature source Oral, resp. rate 20, height 5\' 6"  (1.676 m), weight 68.8 kg (151 lb 10.8 oz), SpO2 100.00%.  GEN:  Pleasant  patient lying in the stretcher in no acute distress; cooperative with exam. PSYCH:  alert and oriented x4; does not appear anxious or depressed; affect is appropriate. HEENT: Mucous membranes pink and anicteric; PERRLA; EOM intact; no cervical lymphadenopathy nor thyromegaly or carotid bruit; no JVD; There were no stridor. Neck is very supple. Breasts:: Not examined CHEST  WALL: No tenderness CHEST: Normal respiration, clear to auscultation bilaterally.  HEART: Regular rate and rhythm.  There are no murmur, rub, or gallops.   BACK: No kyphosis or scoliosis; no CVA tenderness ABDOMEN: soft and non-tender; no masses, no organomegaly, normal abdominal bowel  sounds; no pannus; no intertriginous candida. There is no rebound and no distention. Rectal Exam: Not done EXTREMITIES: No bone or joint deformity; age-appropriate arthropathy of the hands and knees; no edema; no ulcerations.  There is no calf tenderness. Genitalia: not examined PULSES: 2+ and symmetric SKIN: Normal hydration no rash or ulceration CNS: Cranial nerves 2-12 grossly intact no focal lateralizing neurologic deficit.  Speech is fluent; uvula elevated with phonation, facial symmetry and tongue midline. DTR are normal bilaterally, cerebella exam is intact, barbinski is negative and strengths are equaled bilaterally.  No sensory loss.   Labs on Admission:  Basic Metabolic Panel:  Recent Labs Lab 05/01/12 2255 05/02/12 0244  NA 135  --   K 3.4*  --   CL 95*  --   CO2 23  --   GLUCOSE 168*  --   BUN 19  --   CREATININE 1.03 0.85  CALCIUM 9.6  --    Liver Function Tests: No results found for this basename: AST, ALT, ALKPHOS, BILITOT, PROT, ALBUMIN,  in the last 168 hours No results found for this basename: LIPASE, AMYLASE,  in the last 168 hours No results found for this basename: AMMONIA,  in the last 168 hours CBC:  Recent Labs Lab 05/01/12 2255 05/02/12 0244  WBC 9.2 10.2  HGB 14.6 14.0  HCT 42.1 40.4  MCV 86.1 87.1  PLT 227 207   Cardiac Enzymes:  Recent Labs Lab 05/01/12 2257  TROPONINI <0.30    CBG: No results found for this basename: GLUCAP,  in the last 168 hours   Radiological Exams on Admission: Ct Head Wo Contrast  05/01/2012  *RADIOLOGY REPORT*  Clinical Data: 67 year old female - code stroke with unsteady gait and slurred speech.  CT HEAD WITHOUT CONTRAST  Technique:  Contiguous axial images were obtained from the base of the skull through the vertex without contrast.  Comparison: 04/20/2012 MRI  Findings:   Chronic small vessel white matter ischemic changes are again noted.  No acute intracranial abnormalities are identified, including mass  lesion or mass effect, hydrocephalus, extra-axial fluid collection, midline shift, hemorrhage, or acute infarction.  The visualized bony calvarium is unremarkable.  IMPRESSION: No evidence of acute intracranial abnormality.  Chronic small vessel white matter ischemic changes.  Critical Value/emergent results were called by telephone at the time of interpretation on 05/01/2012 at 10:55 p.m. to Dr. Roseanne Reno, who verbally acknowledged these results.   Original Report Authenticated By: Harmon Pier, M.D.    Mr [facility] Wo Contrast  05/02/2012  *RADIOLOGY REPORT*  Clinical Data:  Code stroke.  Headache and confusion  MRI HEAD WITHOUT CONTRAST MRA HEAD WITHOUT CONTRAST  Technique:  Multiplanar, multiecho pulse sequences of the brain and surrounding structures were obtained without intravenous contrast. Angiographic images of the head were obtained using MRA technique without contrast.  Comparison:  CT 05/01/2012  MRI HEAD  Findings:  The study was limited to diffusion weighted imaging only.  The patient was not able to hold still and there is mild motion degrading image quality.  Negative for acute infarct.  There is artifact on the study from motion.  Small acute infarct would be difficult to identify the study.  IMPRESSION: Image quality degraded by motion.  No acute infarct.  MRA HEAD  Findings: The patient could not hold  still.  Motion degrades image quality.  Fetal origin of the posterior cerebral artery bilaterally with hypoplastic distal basilar artery.  The distal basilar ends in the superior cerebellar arteries.  The distal basilar is patulous measuring 3 mm in diameter.  This could be an aneurysm.  Both vertebral arteries are patent to the basilar.  The basilar is patent.  The posterior cerebral arteries are patent bilaterally.  Anterior circulation is difficult to evaluate due to motion.  This area was repeated.  The   cavernous carotid is irregular with atherosclerotic disease.  The anterior and middle  cerebral arteries are patent bilaterally.  There is irregularity of the anterior cerebral arteries and middle cerebral artery bilaterally which is most likely due to atherosclerotic disease.  No large vessel occlusion.  IMPRESSION: Fetal   circulation of the posterior circulation.  Image quality degraded by patient motion.  The distal basilar ends in the superior cerebellar arteries.  The distal basilar is patulous and there may be a 3 mm aneurysm.  Atherosclerotic irregularity in the anterior middle cerebral arteries.  No large vessel occlusion.   Original Report Authenticated By: Janeece Riggers, M.D.    Dg Chest Portable 1 View  05/02/2012  *RADIOLOGY REPORT*  Clinical Data: Slurred speech and weakness.  PORTABLE CHEST - 1 VIEW  Comparison: 07/01/2010  Findings: The cardiomediastinal silhouette is unremarkable. There is no evidence of focal airspace disease, pulmonary edema, suspicious pulmonary nodule/mass, pleural effusion, or pneumothorax. No acute bony abnormalities are identified.  IMPRESSION: No evidence of active cardiopulmonary disease.   Original Report Authenticated By: Harmon Pier, M.D.      Assessment/Plan Present on Admission:  . Migraine syndrome . Anxiety attack . TIA (transient ischemic attack) Anxiety.  PLAN:  I do suspect this is complex migraine as well.  I wonder if there is a psychological component to her symptoms as well.  She does appear to be very anxious.  The recommended work up according to Dr Roseanne Reno is cardiac echo and coratid doppler.  She will be place on an ASA a day, and her migraine will be treated asymptomatically.  She already had significant improvement according to her and her husband.  She is stable, full code, and will be admitted to [facility] service.  Thank you for allowing me to partake in the care of this patient.  Other plans as per orders.  Code Status: FULL Unk Lightning, MD. [facility] Pager [phone] 7pm to 7am.  05/02/2012, 3:45 AM

## 2012-05-02 NOTE — Consult Note (Addendum)
Referring Physician: Dr. Silverio Lay    Chief Complaint:   HPI: Jennifer Estrada is an 67 y.o. female history of migraine headaches and GERD, presenting to the emergency room with complaint of headache and nausea as well as confusion and speech difficulty. Patient has no history of stroke nor TIA. She has not been on antiplatelet therapy. No focal motor abnormality noted. She had an MRI of the brain on 04/21/1998 as an outpatient which was unremarkable. CT scan of her head tonight showed no acute intracranial abnormality. MRI signs of acute ischemic lesion. MRA showed atherosclerotic changes with no large vessel occlusion. Speech was somewhat gibberish at times and other times fairly distinct and delivered. Patient did not follow commands moved extremities equally. Visual fields were intact to confrontation. Patient is afebrile and laboratory studies unremarkable. She arrived in code stroke status which was subsequently canceled.  LSN: 8 PM on 05/01/2012 tPA Given: No: No clear objective neurologic deficit. MRankin: 1  No past medical history on file.  No family history on file.   Medications:  Prior to Admission:  Allegra 180 mg per day  Physical Examination: There were no vitals taken for this visit.  Neurologic Examination: Mental Status: Slightly lethargic but easy to arouse; patient mention of questions and did not follow commands. Speech was largely nonsensical at times and appropriate and coherent and other times; no clear expressive aphasia.   Cranial Nerves: II-Visual fields were normal to confrontation. III/IV/VI-Pupils were equal and reacted. Extraocular movements were full and conjugate.    VII-no facial weakness. X-no dysarthria. Motor: 5/5 bilaterally with normal tone and bulk Sensory: Unable to adequately assess because the difficulty understanding patient's responses. Deep Tendon Reflexes: 2+ and symmetric. Plantars: Mute bilaterally Cerebellar: Unable to assess.  Ct Head  Wo Contrast  05/01/2012  *RADIOLOGY REPORT*  Clinical Data: 68 year old female - code stroke with unsteady gait and slurred speech.  CT HEAD WITHOUT CONTRAST  Technique:  Contiguous axial images were obtained from the base of the skull through the vertex without contrast.  Comparison: 04/20/2012 MRI  Findings:   Chronic small vessel white matter ischemic changes are again noted.  No acute intracranial abnormalities are identified, including mass lesion or mass effect, hydrocephalus, extra-axial fluid collection, midline shift, hemorrhage, or acute infarction.  The visualized bony calvarium is unremarkable.  IMPRESSION: No evidence of acute intracranial abnormality.  Chronic small vessel white matter ischemic changes.  Critical Value/emergent results were called by telephone at the time of interpretation on 05/01/2012 at 10:55 p.m. to Dr. Roseanne Reno, who verbally acknowledged these results.   Original Report Authenticated By: Harmon Pier, M.D.     Assessment: 67 y.o. female presenting with probable complicated migraine with speech difficulty and confusion with no signs of acute area of cerebral ischemia on CT and MRI studies. TIA is less likely but cannot be completely ruled out.  Stroke Risk Factors - family history  Plan: 1. HgbA1c, fasting lipid panel 2. Speech consult if speech changes persist 3. Echocardiogram 4. Carotid dopplers 5. Prophylactic therapy-Antiplatelet med: Aspirin 325 mg daily 6. Analgesic medication as needed for symptomatic treatment of headache 7. Anti-emetic for nausea as needed    C.R. Roseanne Reno, MD [facility] [phone]   05/02/2012, 12:08 AM

## 2012-05-02 NOTE — Progress Notes (Signed)
  Echocardiogram 2D Echocardiogram has been performed.  Jesten Cappuccio 05/02/2012, 11:15 AM

## 2012-05-02 NOTE — Progress Notes (Signed)
Subjective: Patient does not cooperate with exam stating "I just hurrt so bad". She repeats this throughout the interview.   Exam: Filed Vitals:   05/02/12 0546  BP: 159/98  Pulse: 89  Temp: 98.3 F (36.8 C)  Resp: 20   Afebrile, neck is supple.  Gen: In bed, NAD MS: Awake, Alert, follows commands, but instead of answering questions, perseverates on "I just hurt so bad" AO:ZHYQM, blinks to threat bialterally, EOMI Motor: Moves both arms with equal strength.  Sensory:responds to touch in all four ext.   Impression: 67 yo with a history of migraine who presents with confusion and headache. She has no signs of infection and cleared following her initial presentation(A+Ox4 per Admitting physician). I suspect complicated migraine and will treat as such at this time.   Recommendations: 1)Compazine 10mg  IV + Benadryl 25mg  IV 2) Depakote 1000mg  IV 3) Will continue to follow.   Ritta Slot, MD [facility] [phone]  If 7pm- 7am, please page neurology on call at [phone].

## 2012-05-02 NOTE — Progress Notes (Signed)
Portable EEG completed

## 2012-05-02 NOTE — Procedures (Signed)
LP under fluoro 20g L2/3 5ml CSF OP 20cmH2O No complication No blood loss. See complete dictation in [facility].

## 2012-05-02 NOTE — Evaluation (Signed)
Clinical/Bedside Swallow Evaluation Patient Details  Name: Jennifer Estrada MRN: [id number] Date of Birth: 1945/07/28  Today's Date: 05/02/2012 Time: 4098-1191 SLP Time Calculation (min): 12 min  Past Medical History:  Past Medical History  Diagnosis Date  . Migraine    Past Surgical History: No past surgical history on file. HPI:  67 y.o. female with hx of migraine and seasonal allergy presents to the ER as a code stroke four hours after ictus of confusion, slurred speech, and headaches.  Code stroke was subsequently cancelled.  Over the course of her ER stay, she began to have improvement of her symptoms.  Work up in the ER showed MRI/MRA which are negative for acute CVA or any large vessel stenosis.  Dx probable complex migraine.     Assessment / Plan / Recommendation Clinical Impression  Pt presents with normal oropharyngeal swallow function.  No evidence of dysphagia.  Recommend advancing diet to regular, thin liquids.      Aspiration Risk  None    Diet Recommendation Regular;Thin liquid   Liquid Administration via: Cup;Straw Medication Administration: Whole meds with liquid Supervision:  (assist as needed)    Other  Recommendations Oral Care Recommendations: Oral care BID   Follow Up Recommendations  None    Frequency and Duration        Pertinent Vitals/Pain "My head hurts so bad" -unable to rate per scale.  RN provided pain meds     Oral/Motor/Sensory Function Overall Oral Motor/Sensory Function: Appears within functional limits for tasks assessed   Ice Chips Ice chips: Within functional limits Presentation: Spoon   Thin Liquid Thin Liquid: Within functional limits Presentation: Cup;Straw    Nectar Thick Nectar Thick Liquid: Not tested   Honey Thick Honey Thick Liquid: Not tested   Puree Puree: Within functional limits   Solid  Jennifer Estrada L. [place], [place] CCC/SLP Pager [phone]      Solid: Not tested       Jennifer Estrada 05/02/2012,8:57  AM

## 2012-05-02 NOTE — Progress Notes (Signed)
UR Completed Reighlyn Graves-Bigelow, RN,BSN [phone]  

## 2012-05-02 NOTE — ED Notes (Signed)
Attempted to in and out cath, patient could not tolerate.  MD notified.

## 2012-05-02 NOTE — Discharge Summary (Deleted)
Physician Discharge Summary  Jennifer Estrada:[id number] DOB: August 04, 1945 DOA: 05/01/2012  PCP: Ginette Otto, MD  Admit date: 05/01/2012 Discharge date: 05/02/2012  Time spent: 30 minutes  Recommendations for Outpatient Follow-up:  1. Follow up with PCP  Discharge Diagnoses:  Principal Problem:   Migraine syndrome Active Problems:   Anxiety attack   TIA (transient ischemic attack)   Discharge Condition: stable  Diet recommendation: heart healthy diet  Filed Weights   05/02/12 0154  Weight: 68.8 kg (151 lb 10.8 oz)    History of present illness:  67 y.o. female with hx of migraine and seasonal allergy presents to the ER as a code stroke four hours after ictus of confusion, slurred speech, and headaches. Code stroke was subsequently cancelled. Over the course of her ER stay, she began to have improvement of her symptoms. Work up in the ER showed MRI/MRA which are negative for acute CVA or any large vessel stenosis. Her serology was negative except for mild elevation of her BS. Dr Roseanne Reno of neurology has seen her in the ER and felt that this may represent complex migraine. He recommended that she be admitted to complete the TIA work up.    Hospital Course:  Complex migraines: - started on IV Depakote, benadryl, compazine. - her pain improved. Made her slugish. - she was able to sleep. - change to oral and d/c on oral compazine, sumatriptan prn and Depakote daily.  Procedures:  [id number] for a stroke  Consultations:  NEUROLOGY  Discharge Exam: Filed Vitals:   05/02/12 0030 05/02/12 0100 05/02/12 0154 05/02/12 0546  BP: 146/79 123/86 148/93 159/98  Pulse: 85 90 87 89  Temp:   97.4 F (36.3 C) 98.3 F (36.8 C)  TempSrc:   Oral Oral  Resp: 25 20 20 20   Height:   5\' 6"  (1.676 m)   Weight:   68.8 kg (151 lb 10.8 oz)   SpO2: 100% 100% 100% 99%    General: a&o X3 Cardiovascular: RRR Respiratory: good air movement CTA B/L  Discharge  Instructions  Discharge Orders   Future Appointments Provider Department Dept Phone   05/02/2012 10:30 AM [facility] EEG [phone]   Future Orders Complete By Expires     Diet - low sodium heart healthy  As directed     Increase activity slowly  As directed         Medication List    TAKE these medications       divalproex 250 MG DR tablet  Commonly known as:  DEPAKOTE  Take 1 tablet (250 mg total) by mouth 3 (three) times daily.     fexofenadine-pseudoephedrine 180-240 MG per 24 hr tablet  Commonly known as:  ALLEGRA-D 24  Take 1 tablet by mouth daily.     prochlorperazine 10 MG tablet  Commonly known as:  COMPAZINE  Take 1 tablet (10 mg total) by mouth every 6 (six) hours as needed.     SUMAtriptan 20 MG/ACT nasal spray  Commonly known as:  IMITREX  Place 1 spray (20 mg total) into the nose every 2 (two) hours as needed for migraine.           Follow-up Information   Follow up with Ginette Otto, MD In 2 weeks. (hospital follow up)    Contact information:   [address] [place] [address] [phone]        The results of significant diagnostics from this hospitalization (including imaging, microbiology, ancillary  and laboratory) are listed below for reference.    Significant Diagnostic Studies: Ct Head Wo Contrast  05/01/2012  *RADIOLOGY REPORT*  Clinical Data: 67 year old female - code stroke with unsteady gait and slurred speech.  CT HEAD WITHOUT CONTRAST  Technique:  Contiguous axial images were obtained from the base of the skull through the vertex without contrast.  Comparison: 04/20/2012 MRI  Findings:   Chronic small vessel white matter ischemic changes are again noted.  No acute intracranial abnormalities are identified, including mass lesion or mass effect, hydrocephalus, extra-axial fluid collection, midline shift, hemorrhage, or acute infarction.  The visualized bony calvarium is unremarkable.   IMPRESSION: No evidence of acute intracranial abnormality.  Chronic small vessel white matter ischemic changes.  Critical Value/emergent results were called by telephone at the time of interpretation on 05/01/2012 at 10:55 p.m. to Dr. Roseanne Reno, who verbally acknowledged these results.   Original Report Authenticated By: Harmon Pier, M.D.    Mr [facility] Wo Contrast  05/02/2012  *RADIOLOGY REPORT*  Clinical Data:  Code stroke.  Headache and confusion  MRI HEAD WITHOUT CONTRAST MRA HEAD WITHOUT CONTRAST  Technique:  Multiplanar, multiecho pulse sequences of the brain and surrounding structures were obtained without intravenous contrast. Angiographic images of the head were obtained using MRA technique without contrast.  Comparison:  CT 05/01/2012  MRI HEAD  Findings:  The study was limited to diffusion weighted imaging only.  The patient was not able to hold still and there is mild motion degrading image quality.  Negative for acute infarct.  There is artifact on the study from motion.  Small acute infarct would be difficult to identify the study.  IMPRESSION: Image quality degraded by motion.  No acute infarct.  MRA HEAD  Findings: The patient could not hold  still.  Motion degrades image quality.  Fetal origin of the posterior cerebral artery bilaterally with hypoplastic distal basilar artery.  The distal basilar ends in the superior cerebellar arteries.  The distal basilar is patulous measuring 3 mm in diameter.  This could be an aneurysm.  Both vertebral arteries are patent to the basilar.  The basilar is patent.  The posterior cerebral arteries are patent bilaterally.  Anterior circulation is difficult to evaluate due to motion.  This area was repeated.  The   cavernous carotid is irregular with atherosclerotic disease.  The anterior and middle cerebral arteries are patent bilaterally.  There is irregularity of the anterior cerebral arteries and middle cerebral artery bilaterally which is most likely due to  atherosclerotic disease.  No large vessel occlusion.  IMPRESSION: Fetal   circulation of the posterior circulation.  Image quality degraded by patient motion.  The distal basilar ends in the superior cerebellar arteries.  The distal basilar is patulous and there may be a 3 mm aneurysm.  Atherosclerotic irregularity in the anterior middle cerebral arteries.  No large vessel occlusion.   Original Report Authenticated By: Janeece Riggers, M.D.    Mr [facility] ZO Contrast  04/21/2012  *RADIOLOGY REPORT*  Clinical Data: Severe headache.  Memory loss.  MRI HEAD WITHOUT AND WITH CONTRAST  Technique:  Multiplanar, multiecho pulse sequences of the brain and surrounding structures were obtained according to standard protocol without and with intravenous contrast  Contrast: 13mL MULTIHANCE GADOBENATE DIMEGLUMINE 529 MG/ML IV SOLN  Comparison: None.  Findings: Ventricle size is normal.  Craniocervical junction is normal.  Pituitary is normal in size.  Patchy hyperintensity is seen throughout the periventricular and deep white matter bilaterally.  There  is hyperintensity with a patchy distribution in the basal ganglia and thalami and pons bilaterally.  These findings are most consistent with chronic microvascular ischemia, moderate to advanced.  Negative for acute infarct.  Negative for hemorrhage or mass lesion.  Postcontrast imaging of the brain reveals normal enhancement.  Paranasal sinuses are clear.  IMPRESSION: Moderate to advanced chronic microvascular ischemic change throughout the brain.  No acute infarct or mass.   Original Report Authenticated By: Janeece Riggers, M.D.    Dg Chest Portable 1 View  05/02/2012  *RADIOLOGY REPORT*  Clinical Data: Slurred speech and weakness.  PORTABLE CHEST - 1 VIEW  Comparison: 07/01/2010  Findings: The cardiomediastinal silhouette is unremarkable. There is no evidence of focal airspace disease, pulmonary edema, suspicious pulmonary nodule/mass, pleural effusion, or pneumothorax. No acute  bony abnormalities are identified.  IMPRESSION: No evidence of active cardiopulmonary disease.   Original Report Authenticated By: Harmon Pier, M.D.     Microbiology: No results found for this or any previous visit (from the past 240 hour(s)).   Labs: Basic Metabolic Panel:  Recent Labs Lab 05/01/12 2255 05/02/12 0244  NA 135  --   K 3.4*  --   CL 95*  --   CO2 23  --   GLUCOSE 168*  --   BUN 19  --   CREATININE 1.03 0.85  CALCIUM 9.6  --    Liver Function Tests: No results found for this basename: AST, ALT, ALKPHOS, BILITOT, PROT, ALBUMIN,  in the last 168 hours No results found for this basename: LIPASE, AMYLASE,  in the last 168 hours No results found for this basename: AMMONIA,  in the last 168 hours CBC:  Recent Labs Lab 05/01/12 2255 05/02/12 0244  WBC 9.2 10.2  HGB 14.6 14.0  HCT 42.1 40.4  MCV 86.1 87.1  PLT 227 207   Cardiac Enzymes:  Recent Labs Lab 05/01/12 2257  TROPONINI <0.30   BNP: BNP (last 3 results) No results found for this basename: PROBNP,  in the last 8760 hours CBG:  Recent Labs Lab 05/02/12 0631  GLUCAP 128*       Signed:  Marinda Elk  [facility] 05/02/2012, 9:35 AM

## 2012-05-03 DIAGNOSIS — R4182 Altered mental status, unspecified: Secondary | ICD-10-CM | POA: Diagnosis not present

## 2012-05-03 DIAGNOSIS — F410 Panic disorder [episodic paroxysmal anxiety] without agoraphobia: Secondary | ICD-10-CM | POA: Diagnosis not present

## 2012-05-03 DIAGNOSIS — R4701 Aphasia: Secondary | ICD-10-CM | POA: Diagnosis not present

## 2012-05-03 DIAGNOSIS — G43109 Migraine with aura, not intractable, without status migrainosus: Secondary | ICD-10-CM | POA: Diagnosis not present

## 2012-05-03 LAB — GLUCOSE, CAPILLARY: Glucose-Capillary: 90 mg/dL (ref 70–99)

## 2012-05-03 MED ORDER — PROPRANOLOL HCL ER 80 MG PO CP24: 80.0000 mg | ORAL_CAPSULE | Freq: Every day | ORAL | Status: DC

## 2012-05-03 NOTE — Discharge Summary (Signed)
Physician Discharge Summary  ZAN TRISKA EAV:[id number] DOB: 1945/09/24 DOA: 05/01/2012  PCP: Ginette Otto, MD  Admit date: 05/01/2012 Discharge date: 05/03/2012  Time spent: 30 minutes  Recommendations for Outpatient Follow-up:  1. Follow up with PCP (include homehealth, outpatient follow-up instructions, specific recommendations for PCP to follow-up on, etc.)  Discharge Diagnoses:  Principal Problem:   Migraine syndrome Active Problems:   Anxiety attack  Discharge Condition: stable  Diet recommendation: heart healthy diet  Filed Weights   05/02/12 0154  Weight: 68.8 kg (151 lb 10.8 oz)    History of present illness:  67 y.o. female with hx of migraine and seasonal allergy presents to the ER as a code stroke four hours after ictus of confusion, slurred speech, and headaches. Code stroke was subsequently cancelled. Over the course of her ER stay, she began to have improvement of her symptoms. Work up in the ER showed MRI/MRA which are negative for acute CVA or any large vessel stenosis. Her serology was negative except for mild elevation of her BS. Dr Roseanne Reno of neurology has seen her in the ER and felt that this may represent complex migraine. He recommended that she be admitted to complete the TIA work up.    Hospital Course:  Complex migraines:  - started on IV Depakote, benadryl, compazine.  - her pain improved. Made her slugish.  - she was able to sleep. - MRI, EEG and lumbar punction unrevealing  - It was discuss with neurologist recommended to change Depakote to inderal.  Procedures:  MRI no acute IC abnormalities  LP: no sign of infection  EEG: no seizures  Consultations:  neurology  Discharge Exam: Filed Vitals:   05/02/12 1749 05/02/12 2124 05/03/12 0244 05/03/12 0543  BP: 153/81 150/76 139/80 146/83  Pulse: 94 100 87 80  Temp: 98.7 F (37.1 C) 98.6 F (37 C) 98.1 F (36.7 C) 98.1 F (36.7 C)  TempSrc:  Oral Oral Oral  Resp: 20 18 20  20   Height:      Weight:      SpO2: 98% 100% 98% 97%    General: A&O x3 Cardiovascular: RRR Respiratory: good air movement CTA B/L  Discharge Instructions      Discharge Orders   Future Orders Complete By Expires     Diet - low sodium heart healthy  As directed     Diet - low sodium heart healthy  As directed     Increase activity slowly  As directed     Increase activity slowly  As directed         Medication List    TAKE these medications       fexofenadine-pseudoephedrine 180-240 MG per 24 hr tablet  Commonly known as:  ALLEGRA-D 24  Take 1 tablet by mouth daily.     PARoxetine 30 MG tablet  Commonly known as:  PAXIL  Take 30 mg by mouth every morning.     POTASSIMIN PO  Take 1 capsule by mouth daily.     PRESCRIPTION MEDICATION  Take 1 tablet by mouth daily. Acid reflux medication     prochlorperazine 10 MG tablet  Commonly known as:  COMPAZINE  Take 1 tablet (10 mg total) by mouth every 6 (six) hours as needed.     propranolol ER 80 MG 24 hr capsule  Commonly known as:  INDERAL LA  Take 1 capsule (80 mg total) by mouth daily.     SUMAtriptan 20 MG/ACT nasal spray  Commonly known as:  IMITREX  Place 1 spray (20 mg total) into the nose every 2 (two) hours as needed for migraine.       Follow-up Information   Follow up with Ginette Otto, MD In 2 weeks. (hospital follow up)    Contact information:   [address] [place] [address] [phone]        The results of significant diagnostics from this hospitalization (including imaging, microbiology, ancillary and laboratory) are listed below for reference.    Significant Diagnostic Studies: Ct Head Wo Contrast  05/01/2012  *RADIOLOGY REPORT*  Clinical Data: 67 year old female - code stroke with unsteady gait and slurred speech.  CT HEAD WITHOUT CONTRAST  Technique:  Contiguous axial images were obtained from the base of the skull through the vertex without contrast.   Comparison: 04/20/2012 MRI  Findings:   Chronic small vessel white matter ischemic changes are again noted.  No acute intracranial abnormalities are identified, including mass lesion or mass effect, hydrocephalus, extra-axial fluid collection, midline shift, hemorrhage, or acute infarction.  The visualized bony calvarium is unremarkable.  IMPRESSION: No evidence of acute intracranial abnormality.  Chronic small vessel white matter ischemic changes.  Critical Value/emergent results were called by telephone at the time of interpretation on 05/01/2012 at 10:55 p.m. to Dr. Roseanne Reno, who verbally acknowledged these results.   Original Report Authenticated By: Harmon Pier, M.D.    Mr [facility] Wo Contrast  05/02/2012  *RADIOLOGY REPORT*  Clinical Data:  Code stroke.  Headache and confusion  MRI HEAD WITHOUT CONTRAST MRA HEAD WITHOUT CONTRAST  Technique:  Multiplanar, multiecho pulse sequences of the brain and surrounding structures were obtained without intravenous contrast. Angiographic images of the head were obtained using MRA technique without contrast.  Comparison:  CT 05/01/2012  MRI HEAD  Findings:  The study was limited to diffusion weighted imaging only.  The patient was not able to hold still and there is mild motion degrading image quality.  Negative for acute infarct.  There is artifact on the study from motion.  Small acute infarct would be difficult to identify the study.  IMPRESSION: Image quality degraded by motion.  No acute infarct.  MRA HEAD  Findings: The patient could not hold  still.  Motion degrades image quality.  Fetal origin of the posterior cerebral artery bilaterally with hypoplastic distal basilar artery.  The distal basilar ends in the superior cerebellar arteries.  The distal basilar is patulous measuring 3 mm in diameter.  This could be an aneurysm.  Both vertebral arteries are patent to the basilar.  The basilar is patent.  The posterior cerebral arteries are patent bilaterally.  Anterior  circulation is difficult to evaluate due to motion.  This area was repeated.  The   cavernous carotid is irregular with atherosclerotic disease.  The anterior and middle cerebral arteries are patent bilaterally.  There is irregularity of the anterior cerebral arteries and middle cerebral artery bilaterally which is most likely due to atherosclerotic disease.  No large vessel occlusion.  IMPRESSION: Fetal   circulation of the posterior circulation.  Image quality degraded by patient motion.  The distal basilar ends in the superior cerebellar arteries.  The distal basilar is patulous and there may be a 3 mm aneurysm.  Atherosclerotic irregularity in the anterior middle cerebral arteries.  No large vessel occlusion.   Original Report Authenticated By: Janeece Riggers, M.D.    Mr Laqueta Jean BJ Contrast  04/21/2012  *RADIOLOGY REPORT*  Clinical Data: Severe headache.  Memory  loss.  MRI HEAD WITHOUT AND WITH CONTRAST  Technique:  Multiplanar, multiecho pulse sequences of the brain and surrounding structures were obtained according to standard protocol without and with intravenous contrast  Contrast: 13mL MULTIHANCE GADOBENATE DIMEGLUMINE 529 MG/ML IV SOLN  Comparison: None.  Findings: Ventricle size is normal.  Craniocervical junction is normal.  Pituitary is normal in size.  Patchy hyperintensity is seen throughout the periventricular and deep white matter bilaterally.  There is hyperintensity with a patchy distribution in the basal ganglia and thalami and pons bilaterally.  These findings are most consistent with chronic microvascular ischemia, moderate to advanced.  Negative for acute infarct.  Negative for hemorrhage or mass lesion.  Postcontrast imaging of the brain reveals normal enhancement.  Paranasal sinuses are clear.  IMPRESSION: Moderate to advanced chronic microvascular ischemic change throughout the brain.  No acute infarct or mass.   Original Report Authenticated By: Janeece Riggers, M.D.    Dg Chest Portable 1  View  05/02/2012  *RADIOLOGY REPORT*  Clinical Data: Slurred speech and weakness.  PORTABLE CHEST - 1 VIEW  Comparison: 07/01/2010  Findings: The cardiomediastinal silhouette is unremarkable. There is no evidence of focal airspace disease, pulmonary edema, suspicious pulmonary nodule/mass, pleural effusion, or pneumothorax. No acute bony abnormalities are identified.  IMPRESSION: No evidence of active cardiopulmonary disease.   Original Report Authenticated By: Harmon Pier, M.D.     Microbiology: Recent Results (from the past 240 hour(s))  CSF CULTURE     Status: None   Collection Time    05/02/12  3:20 PM      Result Value Range Status   Specimen Description CSF   Final   Special Requests NONE   Final   Gram Stain     Final   Value: CYTOSPIN SLIDE WBC PRESENT, PREDOMINANTLY PMN     NO ORGANISMS SEEN   Culture PENDING   Incomplete   Report Status PENDING   Incomplete     Labs: Basic Metabolic Panel:  Recent Labs Lab 05/01/12 2255 05/02/12 0244  NA 135  --   K 3.4*  --   CL 95*  --   CO2 23  --   GLUCOSE 168*  --   BUN 19  --   CREATININE 1.03 0.85  CALCIUM 9.6  --    Liver Function Tests: No results found for this basename: AST, ALT, ALKPHOS, BILITOT, PROT, ALBUMIN,  in the last 168 hours No results found for this basename: LIPASE, AMYLASE,  in the last 168 hours No results found for this basename: AMMONIA,  in the last 168 hours CBC:  Recent Labs Lab 05/01/12 2255 05/02/12 0244  WBC 9.2 10.2  HGB 14.6 14.0  HCT 42.1 40.4  MCV 86.1 87.1  PLT 227 207   Cardiac Enzymes:  Recent Labs Lab 05/01/12 2257  TROPONINI <0.30   BNP: BNP (last 3 results) No results found for this basename: PROBNP,  in the last 8760 hours CBG:  Recent Labs Lab 05/02/12 1221 05/02/12 1341 05/02/12 1633 05/02/12 2143 05/03/12 0641  GLUCAP 70 72 78 96 90       Signed:  FELIZ ORTIZ, Iverna Hammac  [facility] 05/03/2012, 8:16 AM

## 2012-05-03 NOTE — Progress Notes (Signed)
Patient ready for discharge home with her spouse; encouraged po intake at home; increase activity slowly; call for follow up appointment on Monday with Dr. Pete Glatter; report any severe headache, fever, redness, or drainage from her lumbar area; status post lumbar puncture on Saturday. Bruise noted at lumbar puncture site; no redness or drainage. VSS; discharged home with Rxs. And instructions.

## 2012-05-03 NOTE — Progress Notes (Signed)
Subjective: Headache much improved, speech back to baseline.  Her speech improved after full treatment of her headache.   Exam: Filed Vitals:   05/03/12 0543  BP: 146/83  Pulse: 80  Temp: 98.1 F (36.7 C)  Resp: 20   Gen: In bed, NAD MS: Awake, Alert, able to name objects and repeat. No signs of aphasia today.  ZO:XWRUE, EOMI Motor: 5/5 strength throughout, significnat postural and intentional tremor.  Sensory:intact to LT  Impression: 67 yo F with AMS in the setting of a severe headache that resovled with typical migraine treatments. This has happened before. She had migraines previously prior to menopause, and got "sinus headaches" since that time, which may actually represent milder migraine. At this time, I feel that complicated migraine is the most likely diagnosis.   LP and MRI were negative.   Dx: Migraine with aura Essential Tremor  Recommendations: 1)Inderal for migraine prophylaxis and treatment of essential tremor, starting dose is 80mg /day of long acting formulation.  2) Will sign off at this time, please call with any further questions.   Ritta Slot, MD [facility] [phone]  If 7pm- 7am, please page neurology on call at [phone].

## 2012-05-04 LAB — HERPES SIMPLEX VIRUS(HSV) DNA BY PCR

## 2012-05-06 LAB — CSF CULTURE W GRAM STAIN: Culture: NO GROWTH

## 2012-05-29 LAB — FUNGUS CULTURE W SMEAR: Fungal Smear: NONE SEEN

## 2012-06-25 ENCOUNTER — Other Ambulatory Visit: Payer: Self-pay | Admitting: Endocrinology

## 2012-06-26 ENCOUNTER — Other Ambulatory Visit: Payer: Self-pay | Admitting: Endocrinology

## 2012-06-26 ENCOUNTER — Other Ambulatory Visit (INDEPENDENT_AMBULATORY_CARE_PROVIDER_SITE_OTHER): Payer: Self-pay | Admitting: Surgery

## 2012-06-26 DIAGNOSIS — M810 Age-related osteoporosis without current pathological fracture: Secondary | ICD-10-CM

## 2012-07-10 ENCOUNTER — Ambulatory Visit
Admission: RE | Admit: 2012-07-10 | Discharge: 2012-07-10 | Disposition: A | Discharge status: Home / Self Care | Payer: Medicare Other | Source: Ambulatory Visit

## 2012-07-10 ENCOUNTER — Other Ambulatory Visit: Payer: Self-pay

## 2012-07-10 DIAGNOSIS — Z1231 Encounter for screening mammogram for malignant neoplasm of breast: Secondary | ICD-10-CM

## 2013-06-22 ENCOUNTER — Other Ambulatory Visit: Payer: Self-pay

## 2013-06-22 DIAGNOSIS — Z1231 Encounter for screening mammogram for malignant neoplasm of breast: Secondary | ICD-10-CM

## 2013-07-13 ENCOUNTER — Encounter (INDEPENDENT_AMBULATORY_CARE_PROVIDER_SITE_OTHER): Payer: Self-pay

## 2013-07-13 ENCOUNTER — Ambulatory Visit
Admission: RE | Admit: 2013-07-13 | Discharge: 2013-07-13 | Disposition: A | Discharge status: Home / Self Care | Payer: Medicare Other | Source: Ambulatory Visit

## 2013-07-13 DIAGNOSIS — Z1231 Encounter for screening mammogram for malignant neoplasm of breast: Secondary | ICD-10-CM

## 2014-02-07 ENCOUNTER — Emergency Department (HOSPITAL_COMMUNITY): Payer: Medicare Other

## 2014-02-07 ENCOUNTER — Emergency Department (HOSPITAL_COMMUNITY)
Admission: EM | Admit: 2014-02-07 | Discharge: 2014-02-07 | Disposition: A | Discharge status: Home / Self Care | Payer: Medicare Other | Attending: Emergency Medicine | Admitting: Emergency Medicine

## 2014-02-07 ENCOUNTER — Encounter (HOSPITAL_COMMUNITY): Payer: Self-pay | Admitting: Emergency Medicine

## 2014-02-07 DIAGNOSIS — Y998 Other external cause status: Secondary | ICD-10-CM | POA: Insufficient documentation

## 2014-02-07 DIAGNOSIS — Z7951 Long term (current) use of inhaled steroids: Secondary | ICD-10-CM | POA: Insufficient documentation

## 2014-02-07 DIAGNOSIS — W108XXA Fall (on) (from) other stairs and steps, initial encounter: Secondary | ICD-10-CM | POA: Insufficient documentation

## 2014-02-07 DIAGNOSIS — N189 Chronic kidney disease, unspecified: Secondary | ICD-10-CM | POA: Insufficient documentation

## 2014-02-07 DIAGNOSIS — F329 Major depressive disorder, single episode, unspecified: Secondary | ICD-10-CM | POA: Diagnosis not present

## 2014-02-07 DIAGNOSIS — S022XXA Fracture of nasal bones, initial encounter for closed fracture: Secondary | ICD-10-CM | POA: Insufficient documentation

## 2014-02-07 DIAGNOSIS — S82042A Displaced comminuted fracture of left patella, initial encounter for closed fracture: Secondary | ICD-10-CM | POA: Insufficient documentation

## 2014-02-07 DIAGNOSIS — Y9301 Activity, walking, marching and hiking: Secondary | ICD-10-CM | POA: Insufficient documentation

## 2014-02-07 DIAGNOSIS — Z880 Allergy status to penicillin: Secondary | ICD-10-CM | POA: Diagnosis not present

## 2014-02-07 DIAGNOSIS — G43909 Migraine, unspecified, not intractable, without status migrainosus: Secondary | ICD-10-CM | POA: Diagnosis not present

## 2014-02-07 DIAGNOSIS — Z8719 Personal history of other diseases of the digestive system: Secondary | ICD-10-CM | POA: Insufficient documentation

## 2014-02-07 DIAGNOSIS — Z8639 Personal history of other endocrine, nutritional and metabolic disease: Secondary | ICD-10-CM | POA: Insufficient documentation

## 2014-02-07 DIAGNOSIS — W19XXXA Unspecified fall, initial encounter: Secondary | ICD-10-CM

## 2014-02-07 DIAGNOSIS — Y9289 Other specified places as the place of occurrence of the external cause: Secondary | ICD-10-CM | POA: Insufficient documentation

## 2014-02-07 DIAGNOSIS — S8992XA Unspecified injury of left lower leg, initial encounter: Secondary | ICD-10-CM | POA: Diagnosis present

## 2014-02-07 DIAGNOSIS — S52501A Unspecified fracture of the lower end of right radius, initial encounter for closed fracture: Secondary | ICD-10-CM | POA: Insufficient documentation

## 2014-02-07 DIAGNOSIS — S0993XA Unspecified injury of face, initial encounter: Secondary | ICD-10-CM | POA: Insufficient documentation

## 2014-02-07 DIAGNOSIS — Z79899 Other long term (current) drug therapy: Secondary | ICD-10-CM | POA: Diagnosis not present

## 2014-02-07 LAB — COMPREHENSIVE METABOLIC PANEL
ALBUMIN: 3.8 g/dL (ref 3.5–5.2)
ALK PHOS: 60 U/L (ref 39–117)
ALT: 30 U/L (ref 0–35)
AST: 34 U/L (ref 0–37)
Anion gap: 9 (ref 5–15)
BUN: 16 mg/dL (ref 6–23)
CO2: 26 mmol/L (ref 19–32)
Calcium: 9.5 mg/dL (ref 8.4–10.5)
Chloride: 105 mEq/L (ref 96–112)
Creatinine, Ser: 1.03 mg/dL (ref 0.50–1.10)
GFR calc non Af Amer: 54 mL/min — ABNORMAL LOW (ref >90)
GFR, EST AFRICAN AMERICAN: 63 mL/min — AB (ref >90)
GLUCOSE: 108 mg/dL — AB (ref 70–99)
POTASSIUM: 4.5 mmol/L (ref 3.5–5.1)
Sodium: 140 mmol/L (ref 135–145)
Total Bilirubin: 1.3 mg/dL — ABNORMAL HIGH (ref 0.3–1.2)
Total Protein: 6.7 g/dL (ref 6.0–8.3)

## 2014-02-07 LAB — CBC WITH DIFFERENTIAL/PLATELET
Basophils Absolute: 0.1 10*3/uL (ref 0.0–0.1)
Basophils Relative: 0 % (ref 0–1)
EOS ABS: 0.1 10*3/uL (ref 0.0–0.7)
EOS PCT: 1 % (ref 0–5)
HEMATOCRIT: 39.6 % (ref 36.0–46.0)
HEMOGLOBIN: 12.6 g/dL (ref 12.0–15.0)
LYMPHS ABS: 2.1 10*3/uL (ref 0.7–4.0)
LYMPHS PCT: 17 % (ref 12–46)
MCH: 29.3 pg (ref 26.0–34.0)
MCHC: 31.8 g/dL (ref 30.0–36.0)
MCV: 92.1 fL (ref 78.0–100.0)
MONO ABS: 0.9 10*3/uL (ref 0.1–1.0)
MONOS PCT: 8 % (ref 3–12)
Neutro Abs: 9 10*3/uL — ABNORMAL HIGH (ref 1.7–7.7)
Neutrophils Relative %: 74 % (ref 43–77)
PLATELETS: 223 10*3/uL (ref 150–400)
RBC: 4.3 MIL/uL (ref 3.87–5.11)
RDW: 13.3 % (ref 11.5–15.5)
WBC: 12.2 10*3/uL — AB (ref 4.0–10.5)

## 2014-02-07 LAB — PROTIME-INR
INR: 1.08 (ref 0.00–1.49)
Prothrombin Time: 14.1 seconds (ref 11.6–15.2)

## 2014-02-07 MED ORDER — HYDROMORPHONE HCL 1 MG/ML IJ SOLN
1.0000 mg | Freq: Once | INTRAMUSCULAR | Status: AC
  Administered 2014-02-07: 1 mg via INTRAVENOUS
  Filled 2014-02-07: qty 1

## 2014-02-07 MED ORDER — OXYCODONE-ACETAMINOPHEN 5-325 MG PO TABS
1.0000 | ORAL_TABLET | Freq: Once | ORAL | Status: AC
  Administered 2014-02-07: 1 via ORAL
  Filled 2014-02-07: qty 1

## 2014-02-07 MED ORDER — OXYCODONE-ACETAMINOPHEN 5-325 MG PO TABS: 1.0000 | ORAL_TABLET | ORAL | Status: DC | PRN

## 2014-02-07 MED ORDER — MORPHINE SULFATE 4 MG/ML IJ SOLN
4.0000 mg | Freq: Once | INTRAMUSCULAR | Status: AC
  Administered 2014-02-07: 4 mg via INTRAVENOUS
  Filled 2014-02-07: qty 1

## 2014-02-07 MED ORDER — SODIUM CHLORIDE 0.9 % IV BOLUS (SEPSIS)
500.0000 mL | Freq: Once | INTRAVENOUS | Status: AC
  Administered 2014-02-07: 500 mL via INTRAVENOUS

## 2014-02-07 NOTE — ED Notes (Signed)
Patient transported to CT 

## 2014-02-07 NOTE — Progress Notes (Signed)
Orthopedic Tech Progress Note Patient Details:  Jennifer Estrada 07-24-1945 [id number] Applied fiberglass sugar tong splint to RUE.  Pulses, sensation, motion intact before and after application.  Capillary refill less than 2 seconds before and after application.  Applied Velcro knee immobilizer to LLE.  Pulses, sensation, motion intact before and after application.  Capillary refill less than 2 seconds before and after application.  Fit pt. for crutches and taught unilateral use of same. Ortho Devices Type of Ortho Device: Sugartong splint, Knee Immobilizer, Crutches Ortho Device/Splint Location: Sugar tong RUE.  Knee immobilizer LLE. Ortho Device/Splint Interventions: Application   Lesle ChrisGilliland, Jennifer Estrada 02/07/2014, 10:17 PM

## 2014-02-07 NOTE — ED Notes (Signed)
Discharge instructions reviewed with pt. Pt verbalized understanding.   

## 2014-02-07 NOTE — ED Provider Notes (Signed)
Patient presented to the ER with fall. Patient complaining of pain of the right wrist, left knee andno loss of consciousness.  Face to face Exam: HEENT - PERRLA, contusion bridge of nose, no septal hematoma Lungs - CTAB Heart - RRR, no M/R/G Abd - S/NT/ND Neuro - alert, oriented x3 Muscular skeletal - tenderness, swelling left knee, right wrist  Plan:  X-ray confirms distal radius fracture of the right wrist. She has a patellar fracture of the left knee. These areas were mobilized. She does have a nondisplaced nasal fracture, will not need intervention. No septal hematoma. Patient appropriate for outpatient follow-up with orthopedics.  Gilda Creasehristopher J. Pollina, MD 02/07/14 2229

## 2014-02-07 NOTE — Discharge Instructions (Signed)
Take the prescribed medication as directed. Follow-up with Dr. Cleophas DunkerWhitfield-- call their office in the morning to see if they would like you to be seen sooner than your Jan 1st appt. Return to the ED for new or worsening symptoms.

## 2014-02-07 NOTE — ED Provider Notes (Signed)
CSN: [id number]     Arrival date & time 02/07/14  1429 History   First MD Initiated Contact with Patient 02/07/14 1820     Chief Complaint  Patient presents with  . Fall     (Consider location/radiation/quality/duration/timing/severity/associated sxs/prior Treatment) Patient is a 68 y.o. female presenting with fall. The history is provided by the patient and medical records.  Fall Associated symptoms include arthralgias and joint swelling.    This is a 68 year old female with past medical history significant for hyperlipidemia, GERD, vitamin D deficiency, degenerative joint disease, CKD, migraines, presenting to the ED after a fall that occurred this afternoon.  Patient states she was walking up her front steps at her home which were wet from the rain, she slipped and fell face first into the concrete steps.  No LOC.  Patient complaints of right wrist pain, left knee pain, and facial pain.  She denies current headache, dizziness, lightheadedness, visual disturbance, changes in speech, or ataxia.  Patient has not been able to bear weight on her left leg since injury.  She denies numbness or paresthesias of extremities.  Patient is not currently on any type of anticoagulation.  Patient states she sees Dr. Adolm JosephWhitlock for right knee injections ([facility]).  Past Medical History  Diagnosis Date  . Migraine   . Allergic rhinitis   . Osteoporosis   . Depression   . Hyperlipidemia   . GERD (gastroesophageal reflux disease)   . Vitamin D deficiency   . DJD (degenerative joint disease) of knee   . Chronic kidney disease    History reviewed. No pertinent past surgical history. History reviewed. No pertinent family history. History  Substance Use Topics  . Smoking status: Never Smoker   . Smokeless tobacco: Never Used  . Alcohol Use: No   OB History    No data available     Review of Systems  Musculoskeletal: Positive for joint swelling and arthralgias.  All other systems  reviewed and are negative.     Allergies  Atorvastatin; Cortisone; Paroxetine; Penicillins; Pravastatin; Singulair; Sulfa antibiotics; Augmentin; and Prednisone  Home Medications   Prior to Admission medications   Medication Sig Start Date End Date Taking? Authorizing Provider  fexofenadine-pseudoephedrine (ALLEGRA-D 24) 180-240 MG per 24 hr tablet Take 1 tablet by mouth daily.   Yes Historical Provider, MD  fluticasone (FLONASE) 50 MCG/ACT nasal spray Place 2 sprays into both nostrils daily.   Yes Historical Provider, MD  magnesium 30 MG tablet Take 30 mg by mouth 2 (two) times daily.   Yes Historical Provider, MD  PARoxetine (PAXIL) 30 MG tablet Take 30 mg by mouth every morning.   Yes Historical Provider, MD  propranolol ER (INDERAL LA) 80 MG 24 hr capsule Take 1 capsule (80 mg total) by mouth daily. 05/03/12  Yes Marinda ElkAbraham Feliz Ortiz, MD  prochlorperazine (COMPAZINE) 10 MG tablet Take 1 tablet (10 mg total) by mouth every 6 (six) hours as needed. Patient not taking: Reported on 02/07/2014 05/02/12   Marinda ElkAbraham Feliz Ortiz, MD   BP 139/64 mmHg  Pulse 62  Temp(Src) 97.4 F (36.3 C) (Oral)  Resp 12  Ht 5\' 6"  (1.676 m)  Wt 160 lb (72.576 kg)  BMI 25.84 kg/m2  SpO2 97%   Physical Exam  Constitutional: She is oriented to person, place, and time. She appears well-developed and well-nourished.  HENT:  Head: Normocephalic and atraumatic.  Right Ear: Tympanic membrane and ear canal normal.  Left Ear: Tympanic membrane and ear canal normal.  Nose: Sinus tenderness present.  Mouth/Throat: Oropharynx is clear and moist.  Multiple abrasions, swelling, and bruising to bridge of nose; bruising extending beneath both eyes; locally TTP; dried blood noted in right nare without active bleeding; no septal hematoma or deformity noted; mid-face stable; abrasion and swelling noted to upper lip without active bleeding; dentition appears intact  Eyes: Conjunctivae and EOM are normal. Pupils are equal,  round, and reactive to light.  Neck: Normal range of motion.  Cardiovascular: Normal rate, regular rhythm and normal heart sounds.   Pulmonary/Chest: Effort normal and breath sounds normal. No respiratory distress. She has no wheezes.  Abdominal: Soft. Bowel sounds are normal.  Musculoskeletal:       Right wrist: She exhibits tenderness, bony tenderness and swelling. She exhibits no deformity.       Left knee: She exhibits decreased range of motion and swelling. Tenderness found.       Legs: Right wrist with swelling and bruising along radial aspect; no gross deformities; limited ROM due to pain; strong radial pulse and cap refill; sensation intact throughout Left knee with multiple abrasions and diffuse swelling noted; severely limited ROM due to pain; DP pulse remains intact; normal ROM of ankle and all toes; normal sensation throughout  Neurological: She is alert and oriented to person, place, and time.  AAOx3, answering questions appropriately; equal strength UE and LE bilaterally; CN grossly intact; moves all extremities appropriately without ataxia; no focal neuro deficits or facial asymmetry appreciated  Skin: Skin is warm and dry.  Psychiatric: She has a normal mood and affect.  Nursing note and vitals reviewed.   ED Course  Procedures (including critical care time) Labs Review Labs Reviewed - No data to display  Imaging Review Dg Wrist Complete Right  02/07/2014   CLINICAL DATA:  Fall this afternoon. Painful, swollen wrist. Initial encounter.  EXAM: RIGHT WRIST - COMPLETE 3+ VIEW  COMPARISON:  None.  FINDINGS: There is a fracture through the distal right radial metaphysis. Slight posterior angulation, but no significant displacement. No visible ulnar abnormality.  IMPRESSION: Distal right radial fracture with slight posterior angulation.   Electronically Signed   By: Charlett NoseKevin  Dover M.D.   On: 02/07/2014 17:39   Ct Head Wo Contrast  02/07/2014   CLINICAL DATA:  Patient status post  fall.  Facial swelling.  EXAM: CT HEAD WITHOUT CONTRAST  CT MAXILLOFACIAL WITHOUT CONTRAST  CT CERVICAL SPINE WITHOUT CONTRAST  TECHNIQUE: Multidetector CT imaging of the head, cervical spine, and maxillofacial structures were performed using the standard protocol without intravenous contrast. Multiplanar CT image reconstructions of the cervical spine and maxillofacial structures were also generated.  COMPARISON:  CT brain 05/01/2012  FINDINGS: CT HEAD FINDINGS  Ventricles and sulci are appropriate for patient's age. No evidence for acute cortically based infarct, intracranial hemorrhage, mass lesion or mass effect. Periventricular and subcortical white matter hypodensity compatible with chronic small vessel ischemic change. 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  CT MAXILLOFACIAL FINDINGS  Minimally displaced nasal bone fracture with overlying soft tissue swelling. Paranasal sinuses are unremarkable. Mastoid air cells are unremarkable. Zygomatic arches are intact. Pterygoid plates are intact. Maxilla and mandible are unremarkable without evidence for fracture.  CT CERVICAL SPINE FINDINGS  Normal anatomic alignment. Multilevel degenerative disc disease most pronounced at C5-6 and C6-7. Mild anterior height loss of the C5 vertebral body, age indeterminate. Craniocervical junction is unremarkable. Prevertebral soft tissues are unremarkable. No evidence for  acute displaced fracture.  IMPRESSION: Age-indeterminate anterior height loss of the C5 vertebral body.  Acute nasal bone fracture with overlying soft tissue swelling.  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   Electronically Signed   By: Annia Belt M.D.   On: 02/07/2014 20:54   Ct Cervical Spine Wo Contrast  02/07/2014   CLINICAL DATA:  Patient status post fall.  Facial swelling.   EXAM: CT HEAD WITHOUT CONTRAST  CT MAXILLOFACIAL WITHOUT CONTRAST  CT CERVICAL SPINE WITHOUT CONTRAST  TECHNIQUE: Multidetector CT imaging of the head, cervical spine, and maxillofacial structures were performed using the standard protocol without intravenous contrast. Multiplanar CT image reconstructions of the cervical spine and maxillofacial structures were also generated.  COMPARISON:  CT brain 05/01/2012  FINDINGS: CT HEAD FINDINGS  Ventricles and sulci are appropriate for patient's age. No evidence for acute cortically based infarct, intracranial hemorrhage, mass lesion or mass effect. Periventricular and subcortical white matter hypodensity compatible with chronic small vessel ischemic change. 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  CT MAXILLOFACIAL FINDINGS  Minimally displaced nasal bone fracture with overlying soft tissue swelling. Paranasal sinuses are unremarkable. Mastoid air cells are unremarkable. Zygomatic arches are intact. Pterygoid plates are intact. Maxilla and mandible are unremarkable without evidence for fracture.  CT CERVICAL SPINE FINDINGS  Normal anatomic alignment. Multilevel degenerative disc disease most pronounced at C5-6 and C6-7. Mild anterior height loss of the C5 vertebral body, age indeterminate. Craniocervical junction is unremarkable. Prevertebral soft tissues are unremarkable. No evidence for acute displaced fracture.  IMPRESSION: Age-indeterminate anterior height loss of the C5 vertebral body.  Acute nasal bone fracture with overlying soft tissue swelling.  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   Electronically Signed   By: Annia Belt M.D.   On: 02/07/2014 20:54   Dg Knee Complete 4 Views Left  02/07/2014   CLINICAL DATA:  Pain following fall  EXAM: LEFT KNEE - COMPLETE 4+ VIEW  COMPARISON:   None.  FINDINGS: Frontal, lateral, and bilateral oblique views were obtained. There is a comminuted fracture of the patella with separation of major fracture fragments. No other fractures. No dislocation. There is a joint effusion. There is mild joint space narrowing medially.  IMPRESSION: Comminuted patellar fracture with displaced major fracture fragments. No dislocation. Mild osteoarthritic change.   Electronically Signed   By: Bretta Bang M.D.   On: 02/07/2014 17:39   Ct Maxillofacial Wo Cm  02/07/2014   CLINICAL DATA:  Patient status post fall.  Facial swelling.  EXAM: CT HEAD WITHOUT CONTRAST  CT MAXILLOFACIAL WITHOUT CONTRAST  CT CERVICAL SPINE WITHOUT CONTRAST  TECHNIQUE: Multidetector CT imaging of the head, cervical spine, and maxillofacial structures were performed using the standard protocol without intravenous contrast. Multiplanar CT image reconstructions of the cervical spine and maxillofacial structures were also generated.  COMPARISON:  CT brain 05/01/2012  FINDINGS: CT HEAD FINDINGS  Ventricles and sulci are appropriate for patient's age. No evidence for acute cortically based infarct, intracranial hemorrhage, mass lesion or mass effect. Periventricular and subcortical white matter hypodensity compatible with chronic small vessel ischemic change. 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  CT MAXILLOFACIAL FINDINGS  Minimally displaced nasal bone fracture with overlying soft tissue swelling. Paranasal sinuses are unremarkable. Mastoid air cells are unremarkable. Zygomatic arches  are intact. Pterygoid plates are intact. Maxilla and mandible are unremarkable without evidence for fracture.  CT CERVICAL SPINE FINDINGS  Normal anatomic alignment. Multilevel degenerative disc disease most pronounced at C5-6 and C6-7. Mild anterior height loss of the C5 vertebral body, age indeterminate.  Craniocervical junction is unremarkable. Prevertebral soft tissues are unremarkable. No evidence for acute displaced fracture.  IMPRESSION: Age-indeterminate anterior height loss of the C5 vertebral body.  Acute nasal bone fracture with overlying soft tissue swelling.  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   Electronically Signed   By: Annia Belt M.D.   On: 02/07/2014 20:54     EKG Interpretation None      MDM   Final diagnoses:  Fall, initial encounter  Comminuted fracture of patella, left, closed, initial encounter  Distal radius fracture, right, closed, initial encounter  Nasal fracture, closed, initial encounter   68 year old female with fall front steps this morning. There was head injury but no loss of consciousness. On exam, patient is alert and baseline oriented. She has no focal neurologic deficit. She has noted swelling of her left knee and right wrist, as well as abrasions and bruising of her nose.  Plain films of left knee obtained in triage, revealing comminuted patellar fracture. Plain films of right wrist were also obtained revealing distal radius fracture. Affected limbs remain neurovascularly intact. Will obtain CT head/CS/max-face as well as basic labs.  Patient given dilaudid for pain control.  CT head and CS negative for acute findings-- chronic changes noted.  CT max-face revealing nasal fracture.  Lab work reassuring.  Pain currently well controlled with dilaudid.  Case discussed with orthopedic surgery, Dr. Rayburn Ma-- knee immobilized for left knee with weight bearing as tolerated and splint for right wrist, will follow both injuries in office this week.  Immobilizer and splint applied.  Will have patient ambulate in the ED prior to discharge.  11:23 PM Patient able to stand at bedside with assistance, able to take minimal steps without assistance.  i have discussed with her the possibility of  admission to the hospital for comfort care since mobility will be an issue, patient still wishes to go home.  States her husband has called neighbor who will help get her up the front steps and into the home.  Patient states once she is in the house, she doesn't feel that she will have any issues.  Patient to be discharged home at her insistence.  She has previously scheduled FU with orthopedics on Jan 1, however i have instructed her to call their office in the morning for a possible earlier appt.  Patient also given ENT follow-up regarding her nasal fracture.  Rx percocet. Discussed plan with patient, he/she acknowledged understanding and agreed with plan of care.  Return precautions given for new or worsening symptoms.  Case discussed with attending physician, Dr. Blinda Leatherwood, who evaluated patient and agrees with assessment and plan of care.  Garlon Hatchet, PA-C 02/08/14 0015  Gilda Crease, MD 02/08/14 [id number]

## 2014-02-07 NOTE — ED Notes (Signed)
Pt reports she slipped and hit face onto steps; pt has swelling/pain to L knee, R wrist, and nose

## 2014-02-10 ENCOUNTER — Encounter (HOSPITAL_COMMUNITY): Payer: Self-pay | Admitting: *Deleted

## 2014-02-12 ENCOUNTER — Inpatient Hospital Stay (HOSPITAL_COMMUNITY): Payer: Medicare Other | Admitting: Anesthesiology

## 2014-02-12 ENCOUNTER — Inpatient Hospital Stay (HOSPITAL_COMMUNITY)
Admission: RE | Admit: 2014-02-12 | Discharge: 2014-02-15 | DRG: 516 | Disposition: A | Discharge status: Home / Self Care | Payer: Medicare Other | Source: Ambulatory Visit | Attending: Orthopedic Surgery | Admitting: Orthopedic Surgery

## 2014-02-12 ENCOUNTER — Inpatient Hospital Stay (HOSPITAL_COMMUNITY): Payer: Medicare Other

## 2014-02-12 ENCOUNTER — Encounter (HOSPITAL_COMMUNITY): Payer: Self-pay

## 2014-02-12 ENCOUNTER — Encounter (HOSPITAL_COMMUNITY)
Admission: RE | Disposition: A | Discharge status: Home / Self Care | Payer: Self-pay | Source: Ambulatory Visit | Attending: Orthopedic Surgery

## 2014-02-12 DIAGNOSIS — W19XXXA Unspecified fall, initial encounter: Secondary | ICD-10-CM | POA: Diagnosis present

## 2014-02-12 DIAGNOSIS — E785 Hyperlipidemia, unspecified: Secondary | ICD-10-CM | POA: Diagnosis present

## 2014-02-12 DIAGNOSIS — K219 Gastro-esophageal reflux disease without esophagitis: Secondary | ICD-10-CM | POA: Diagnosis present

## 2014-02-12 DIAGNOSIS — Z880 Allergy status to penicillin: Secondary | ICD-10-CM

## 2014-02-12 DIAGNOSIS — S82002D Unspecified fracture of left patella, subsequent encounter for closed fracture with routine healing: Secondary | ICD-10-CM

## 2014-02-12 DIAGNOSIS — N189 Chronic kidney disease, unspecified: Secondary | ICD-10-CM | POA: Diagnosis present

## 2014-02-12 DIAGNOSIS — M179 Osteoarthritis of knee, unspecified: Secondary | ICD-10-CM | POA: Diagnosis present

## 2014-02-12 DIAGNOSIS — G43909 Migraine, unspecified, not intractable, without status migrainosus: Secondary | ICD-10-CM | POA: Diagnosis present

## 2014-02-12 DIAGNOSIS — S52501A Unspecified fracture of the lower end of right radius, initial encounter for closed fracture: Secondary | ICD-10-CM | POA: Diagnosis present

## 2014-02-12 DIAGNOSIS — S82002A Unspecified fracture of left patella, initial encounter for closed fracture: Secondary | ICD-10-CM | POA: Diagnosis present

## 2014-02-12 DIAGNOSIS — S82042A Displaced comminuted fracture of left patella, initial encounter for closed fracture: Secondary | ICD-10-CM | POA: Diagnosis present

## 2014-02-12 DIAGNOSIS — Z882 Allergy status to sulfonamides status: Secondary | ICD-10-CM | POA: Diagnosis not present

## 2014-02-12 DIAGNOSIS — M810 Age-related osteoporosis without current pathological fracture: Secondary | ICD-10-CM | POA: Diagnosis present

## 2014-02-12 DIAGNOSIS — Y92009 Unspecified place in unspecified non-institutional (private) residence as the place of occurrence of the external cause: Secondary | ICD-10-CM

## 2014-02-12 DIAGNOSIS — Z419 Encounter for procedure for purposes other than remedying health state, unspecified: Secondary | ICD-10-CM

## 2014-02-12 DIAGNOSIS — S82009A Unspecified fracture of unspecified patella, initial encounter for closed fracture: Secondary | ICD-10-CM | POA: Diagnosis present

## 2014-02-12 HISTORY — DX: Myoneural disorder, unspecified: G70.9

## 2014-02-12 HISTORY — PX: ORIF PATELLA: SHX5033

## 2014-02-12 HISTORY — DX: Tremor, unspecified: R25.1

## 2014-02-12 LAB — PROTIME-INR
INR: 1.12 (ref 0.00–1.49)
PROTHROMBIN TIME: 14.6 s (ref 11.6–15.2)

## 2014-02-12 LAB — BASIC METABOLIC PANEL
Anion gap: 10 (ref 5–15)
BUN: 12 mg/dL (ref 6–23)
CHLORIDE: 104 meq/L (ref 96–112)
CO2: 26 mmol/L (ref 19–32)
Calcium: 9.1 mg/dL (ref 8.4–10.5)
Creatinine, Ser: 0.86 mg/dL (ref 0.50–1.10)
GFR calc Af Amer: 78 mL/min — ABNORMAL LOW (ref >90)
GFR calc non Af Amer: 67 mL/min — ABNORMAL LOW (ref >90)
Glucose, Bld: 100 mg/dL — ABNORMAL HIGH (ref 70–99)
Potassium: 3.6 mmol/L (ref 3.5–5.1)
Sodium: 140 mmol/L (ref 135–145)

## 2014-02-12 LAB — TYPE AND SCREEN
ABO/RH(D): O NEG
Antibody Screen: NEGATIVE

## 2014-02-12 LAB — CBC
HCT: 36 % (ref 36.0–46.0)
HEMOGLOBIN: 11.6 g/dL — AB (ref 12.0–15.0)
MCH: 30.4 pg (ref 26.0–34.0)
MCHC: 32.2 g/dL (ref 30.0–36.0)
MCV: 94.2 fL (ref 78.0–100.0)
Platelets: 249 10*3/uL (ref 150–400)
RBC: 3.82 MIL/uL — AB (ref 3.87–5.11)
RDW: 13.7 % (ref 11.5–15.5)
WBC: 8.7 10*3/uL (ref 4.0–10.5)

## 2014-02-12 LAB — ABO/RH: ABO/RH(D): O NEG

## 2014-02-12 LAB — APTT: APTT: 29 s (ref 24–37)

## 2014-02-12 SURGERY — OPEN REDUCTION INTERNAL FIXATION (ORIF) PATELLA
Anesthesia: Spinal | Site: Knee | Laterality: Left

## 2014-02-12 MED ORDER — DOCUSATE SODIUM 100 MG PO CAPS
100.0000 mg | ORAL_CAPSULE | Freq: Two times a day (BID) | ORAL | Status: DC
  Administered 2014-02-12 – 2014-02-15 (×7): 100 mg via ORAL
  Filled 2014-02-12 (×8): qty 1

## 2014-02-12 MED ORDER — METOCLOPRAMIDE HCL 10 MG PO TABS: 5.0000 mg | ORAL_TABLET | Freq: Three times a day (TID) | ORAL | Status: DC | PRN

## 2014-02-12 MED ORDER — MAGNESIUM 30 MG PO TABS: 30.0000 mg | ORAL_TABLET | Freq: Two times a day (BID) | ORAL | Status: DC

## 2014-02-12 MED ORDER — PROPOFOL 10 MG/ML IV BOLUS
INTRAVENOUS | Status: AC
  Filled 2014-02-12: qty 20

## 2014-02-12 MED ORDER — PAROXETINE HCL 20 MG PO TABS
20.0000 mg | ORAL_TABLET | Freq: Every morning | ORAL | Status: DC
  Administered 2014-02-13 – 2014-02-15 (×3): 20 mg via ORAL
  Filled 2014-02-12 (×3): qty 1

## 2014-02-12 MED ORDER — HYDROMORPHONE HCL 1 MG/ML IJ SOLN
0.5000 mg | INTRAMUSCULAR | Status: DC | PRN
  Administered 2014-02-12 (×2): 1 mg via INTRAVENOUS
  Filled 2014-02-12 (×2): qty 1

## 2014-02-12 MED ORDER — PROPOFOL INFUSION 10 MG/ML OPTIME
INTRAVENOUS | Status: DC | PRN
  Administered 2014-02-12: 75 ug/kg/min via INTRAVENOUS

## 2014-02-12 MED ORDER — POLYETHYLENE GLYCOL 3350 17 G PO PACK
17.0000 g | PACK | Freq: Two times a day (BID) | ORAL | Status: DC
  Administered 2014-02-12 – 2014-02-15 (×5): 17 g via ORAL
  Filled 2014-02-12 (×8): qty 1

## 2014-02-12 MED ORDER — PANTOPRAZOLE SODIUM 40 MG PO TBEC
40.0000 mg | DELAYED_RELEASE_TABLET | Freq: Every day | ORAL | Status: DC
  Administered 2014-02-12 – 2014-02-15 (×4): 40 mg via ORAL
  Filled 2014-02-12 (×4): qty 1

## 2014-02-12 MED ORDER — HYDROMORPHONE HCL 1 MG/ML IJ SOLN
0.5000 mg | INTRAMUSCULAR | Status: DC | PRN
  Administered 2014-02-12: 1 mg via INTRAVENOUS
  Administered 2014-02-13: 2 mg via INTRAVENOUS
  Administered 2014-02-13: 1 mg via INTRAVENOUS
  Filled 2014-02-12 (×2): qty 1
  Filled 2014-02-12: qty 2

## 2014-02-12 MED ORDER — ALUM & MAG HYDROXIDE-SIMETH 200-200-20 MG/5ML PO SUSP
15.0000 mL | ORAL | Status: DC | PRN
  Administered 2014-02-12: 30 mL via ORAL
  Filled 2014-02-12: qty 30

## 2014-02-12 MED ORDER — ONDANSETRON HCL 4 MG/2ML IJ SOLN: 4.0000 mg | Freq: Four times a day (QID) | INTRAMUSCULAR | Status: DC | PRN

## 2014-02-12 MED ORDER — FENTANYL CITRATE 0.05 MG/ML IJ SOLN
INTRAMUSCULAR | Status: AC
  Filled 2014-02-12: qty 5

## 2014-02-12 MED ORDER — LACTATED RINGERS IV SOLN
INTRAVENOUS | Status: DC | PRN
  Administered 2014-02-12 (×2): via INTRAVENOUS

## 2014-02-12 MED ORDER — BUPIVACAINE-EPINEPHRINE (PF) 0.25% -1:200000 IJ SOLN
INTRAMUSCULAR | Status: AC
  Filled 2014-02-12: qty 30

## 2014-02-12 MED ORDER — SODIUM CHLORIDE 0.9 % IJ SOLN
INTRAMUSCULAR | Status: AC
  Filled 2014-02-12: qty 10

## 2014-02-12 MED ORDER — CALCIUM CITRATE-VITAMIN D 315-200 MG-UNIT PO TABS: 1.0000 | ORAL_TABLET | Freq: Two times a day (BID) | ORAL | Status: DC

## 2014-02-12 MED ORDER — FENTANYL CITRATE 0.05 MG/ML IJ SOLN: 25.0000 ug | INTRAMUSCULAR | Status: DC | PRN

## 2014-02-12 MED ORDER — CALCIUM CARBONATE-VITAMIN D 500-200 MG-UNIT PO TABS
0.5000 | ORAL_TABLET | Freq: Two times a day (BID) | ORAL | Status: DC
  Filled 2014-02-12 (×2): qty 0.5

## 2014-02-12 MED ORDER — SODIUM CHLORIDE 0.9 % IV SOLN
INTRAVENOUS | Status: DC
  Administered 2014-02-12 – 2014-02-13 (×2): via INTRAVENOUS
  Filled 2014-02-12 (×8): qty 1000

## 2014-02-12 MED ORDER — FENTANYL CITRATE 0.05 MG/ML IJ SOLN
INTRAMUSCULAR | Status: AC
  Filled 2014-02-12: qty 2

## 2014-02-12 MED ORDER — ACETAMINOPHEN 10 MG/ML IV SOLN
INTRAVENOUS | Status: DC | PRN
  Administered 2014-02-12: 1000 mg via INTRAVENOUS

## 2014-02-12 MED ORDER — MAGNESIUM GLUCONATE 500 MG PO TABS
500.0000 mg | ORAL_TABLET | Freq: Two times a day (BID) | ORAL | Status: DC
  Administered 2014-02-12 – 2014-02-14 (×6): 500 mg via ORAL
  Administered 2014-02-15: 10:00:00 via ORAL
  Filled 2014-02-12 (×8): qty 1

## 2014-02-12 MED ORDER — BUPIVACAINE-EPINEPHRINE 0.25% -1:200000 IJ SOLN
INTRAMUSCULAR | Status: DC | PRN
  Administered 2014-02-12: 30 mL

## 2014-02-12 MED ORDER — ROSUVASTATIN CALCIUM 5 MG PO TABS
5.0000 mg | ORAL_TABLET | Freq: Every day | ORAL | Status: DC
  Administered 2014-02-12 – 2014-02-14 (×3): 5 mg via ORAL
  Filled 2014-02-12 (×4): qty 1

## 2014-02-12 MED ORDER — MIDAZOLAM HCL 2 MG/2ML IJ SOLN
INTRAMUSCULAR | Status: AC
  Filled 2014-02-12: qty 2

## 2014-02-12 MED ORDER — METHOCARBAMOL 1000 MG/10ML IJ SOLN
500.0000 mg | Freq: Four times a day (QID) | INTRAVENOUS | Status: DC | PRN
  Administered 2014-02-12: 500 mg via INTRAVENOUS
  Filled 2014-02-12 (×3): qty 5

## 2014-02-12 MED ORDER — CHLORHEXIDINE GLUCONATE 4 % EX LIQD: 60.0000 mL | Freq: Once | CUTANEOUS | Status: DC

## 2014-02-12 MED ORDER — CEFAZOLIN SODIUM-DEXTROSE 2-3 GM-% IV SOLR
2.0000 g | INTRAVENOUS | Status: AC
  Administered 2014-02-12: 2 g via INTRAVENOUS
  Filled 2014-02-12: qty 50

## 2014-02-12 MED ORDER — ONDANSETRON HCL 4 MG PO TABS: 4.0000 mg | ORAL_TABLET | Freq: Four times a day (QID) | ORAL | Status: DC | PRN

## 2014-02-12 MED ORDER — OXYCODONE HCL 5 MG PO TABS
5.0000 mg | ORAL_TABLET | ORAL | Status: DC | PRN
  Administered 2014-02-13 (×2): 10 mg via ORAL
  Administered 2014-02-13 (×2): 15 mg via ORAL
  Administered 2014-02-13: 10 mg via ORAL
  Administered 2014-02-14: 5 mg via ORAL
  Filled 2014-02-12: qty 2
  Filled 2014-02-12: qty 3
  Filled 2014-02-12: qty 2
  Filled 2014-02-12 (×2): qty 3
  Filled 2014-02-12: qty 2
  Filled 2014-02-12: qty 1

## 2014-02-12 MED ORDER — ONDANSETRON HCL 4 MG/2ML IJ SOLN
INTRAMUSCULAR | Status: DC | PRN
  Administered 2014-02-12 (×4): 1 mg via INTRAVENOUS

## 2014-02-12 MED ORDER — SODIUM CHLORIDE 0.9 % IV SOLN
10.0000 mg | INTRAVENOUS | Status: DC | PRN
  Administered 2014-02-12: 40 ug/min via INTRAVENOUS

## 2014-02-12 MED ORDER — CALCIUM-VITAMIN D 250-125 MG-UNIT PO TABS
1.0000 | ORAL_TABLET | Freq: Two times a day (BID) | ORAL | Status: DC
  Administered 2014-02-12 – 2014-02-15 (×7): 1 via ORAL
  Filled 2014-02-12 (×9): qty 1

## 2014-02-12 MED ORDER — PHENYLEPHRINE HCL 10 MG/ML IJ SOLN
INTRAMUSCULAR | Status: AC
  Filled 2014-02-12: qty 1

## 2014-02-12 MED ORDER — BUPIVACAINE IN DEXTROSE 0.75-8.25 % IT SOLN
INTRATHECAL | Status: DC | PRN
  Administered 2014-02-12: 2 mL via INTRATHECAL

## 2014-02-12 MED ORDER — METHOCARBAMOL 500 MG PO TABS
500.0000 mg | ORAL_TABLET | Freq: Four times a day (QID) | ORAL | Status: DC | PRN
  Administered 2014-02-13 – 2014-02-15 (×7): 500 mg via ORAL
  Filled 2014-02-12 (×7): qty 1

## 2014-02-12 MED ORDER — ACETAMINOPHEN 325 MG PO TABS: 325.0000 mg | ORAL_TABLET | ORAL | Status: DC | PRN

## 2014-02-12 MED ORDER — ACETAMINOPHEN 160 MG/5ML PO SOLN
325.0000 mg | ORAL | Status: DC | PRN
  Filled 2014-02-12: qty 20.3

## 2014-02-12 MED ORDER — FENTANYL CITRATE 0.05 MG/ML IJ SOLN
INTRAMUSCULAR | Status: DC | PRN
  Administered 2014-02-12 (×2): 25 ug via INTRAVENOUS

## 2014-02-12 MED ORDER — MIDAZOLAM HCL 2 MG/2ML IJ SOLN: 0.5000 mg | Freq: Once | INTRAMUSCULAR | Status: DC | PRN

## 2014-02-12 MED ORDER — METOCLOPRAMIDE HCL 5 MG/ML IJ SOLN: 5.0000 mg | Freq: Three times a day (TID) | INTRAMUSCULAR | Status: DC | PRN

## 2014-02-12 MED ORDER — PROMETHAZINE HCL 25 MG/ML IJ SOLN: 6.2500 mg | INTRAMUSCULAR | Status: DC | PRN

## 2014-02-12 MED ORDER — SODIUM CHLORIDE 0.9 % IJ SOLN
INTRAMUSCULAR | Status: AC
  Filled 2014-02-12: qty 50

## 2014-02-12 MED ORDER — OXYCODONE-ACETAMINOPHEN 5-325 MG PO TABS
1.0000 | ORAL_TABLET | ORAL | Status: DC | PRN
  Administered 2014-02-12: 2 via ORAL
  Filled 2014-02-12: qty 2

## 2014-02-12 MED ORDER — ACETAMINOPHEN 500 MG PO TABS
1000.0000 mg | ORAL_TABLET | Freq: Three times a day (TID) | ORAL | Status: DC
  Administered 2014-02-12 – 2014-02-14 (×5): 1000 mg via ORAL
  Filled 2014-02-12 (×11): qty 2

## 2014-02-12 MED ORDER — MEPERIDINE HCL 50 MG/ML IJ SOLN: 6.2500 mg | INTRAMUSCULAR | Status: DC | PRN

## 2014-02-12 MED ORDER — PROPRANOLOL HCL ER 80 MG PO CP24
80.0000 mg | ORAL_CAPSULE | Freq: Every day | ORAL | Status: DC
  Administered 2014-02-13 – 2014-02-15 (×3): 80 mg via ORAL
  Filled 2014-02-12 (×3): qty 1

## 2014-02-12 MED ORDER — 0.9 % SODIUM CHLORIDE (POUR BTL) OPTIME
TOPICAL | Status: DC | PRN
  Administered 2014-02-12: 1000 mL

## 2014-02-12 MED ORDER — CEFAZOLIN SODIUM 1-5 GM-% IV SOLN
1.0000 g | Freq: Four times a day (QID) | INTRAVENOUS | Status: AC
  Administered 2014-02-12 (×2): 1 g via INTRAVENOUS
  Filled 2014-02-12 (×2): qty 50

## 2014-02-12 MED ORDER — MIDAZOLAM HCL 5 MG/5ML IJ SOLN
INTRAMUSCULAR | Status: DC | PRN
  Administered 2014-02-12 (×2): 0.5 mg via INTRAVENOUS

## 2014-02-12 MED ORDER — SODIUM CHLORIDE 0.9 % IJ SOLN
INTRAMUSCULAR | Status: DC | PRN
  Administered 2014-02-12: 30 mL

## 2014-02-12 SURGICAL SUPPLY — 57 items
BAG ZIPLOCK 12X15 (MISCELLANEOUS) ×2 IMPLANT
BANDAGE ELASTIC 6 VELCRO ST LF (GAUZE/BANDAGES/DRESSINGS) ×2 IMPLANT
BANDAGE ESMARK 6X9 LF (GAUZE/BANDAGES/DRESSINGS) ×1 IMPLANT
BIT DRILL CANN 2.7X625 NONSTRL (BIT) ×2 IMPLANT
BNDG COHESIVE 6X5 TAN STRL LF (GAUZE/BANDAGES/DRESSINGS) ×2 IMPLANT
BNDG ESMARK 6X9 LF (GAUZE/BANDAGES/DRESSINGS) ×2
BNDG GAUZE ELAST 4 BULKY (GAUZE/BANDAGES/DRESSINGS) ×2 IMPLANT
CUFF TOURN SGL QUICK 34 (TOURNIQUET CUFF) ×1
CUFF TOURN SGL QUICK 44 (TOURNIQUET CUFF) IMPLANT
CUFF TRNQT CYL 34X4X40X1 (TOURNIQUET CUFF) ×1 IMPLANT
DECANTER SPIKE VIAL GLASS SM (MISCELLANEOUS) ×2 IMPLANT
DRAPE INCISE IOBAN 66X45 STRL (DRAPES) ×2 IMPLANT
DRAPE U-SHAPE 47X51 STRL (DRAPES) ×2 IMPLANT
DRSG AQUACEL AG ADV 3.5X10 (GAUZE/BANDAGES/DRESSINGS) ×2 IMPLANT
DRSG EMULSION OIL 3X16 NADH (GAUZE/BANDAGES/DRESSINGS) IMPLANT
DRSG PAD ABDOMINAL 8X10 ST (GAUZE/BANDAGES/DRESSINGS) ×2 IMPLANT
DURAPREP 26ML APPLICATOR (WOUND CARE) ×2 IMPLANT
ELECT REM PT RETURN 9FT ADLT (ELECTROSURGICAL) ×2
ELECTRODE REM PT RTRN 9FT ADLT (ELECTROSURGICAL) ×1 IMPLANT
GAUZE SPONGE 4X4 12PLY STRL (GAUZE/BANDAGES/DRESSINGS) ×2 IMPLANT
GLOVE BIOGEL PI IND STRL 7.5 (GLOVE) ×1 IMPLANT
GLOVE BIOGEL PI IND STRL 8.5 (GLOVE) ×1 IMPLANT
GLOVE BIOGEL PI INDICATOR 7.5 (GLOVE) ×1
GLOVE BIOGEL PI INDICATOR 8.5 (GLOVE) ×1
GLOVE ECLIPSE 8.0 STRL XLNG CF (GLOVE) ×2 IMPLANT
GLOVE ORTHO TXT STRL SZ7.5 (GLOVE) ×4 IMPLANT
GLOVE SURG ORTHO 8.0 STRL STRW (GLOVE) ×2 IMPLANT
GOWN SPEC L3 XXLG W/TWL (GOWN DISPOSABLE) ×4 IMPLANT
GOWN STRL REUS W/TWL LRG LVL3 (GOWN DISPOSABLE) ×2 IMPLANT
GUIDEWIRE THREADED 150MM (WIRE) ×4 IMPLANT
IMMOBILIZER KNEE 20 (SOFTGOODS) ×2 IMPLANT
LIQUID BAND (GAUZE/BANDAGES/DRESSINGS) ×2 IMPLANT
MANIFOLD NEPTUNE II (INSTRUMENTS) ×2 IMPLANT
NEEDLE ANCHOR KEITH 2 7/8 STR (NEEDLE) ×2 IMPLANT
NS IRRIG 1000ML POUR BTL (IV SOLUTION) ×2 IMPLANT
PACK ORTHO EXTREMITY (CUSTOM PROCEDURE TRAY) IMPLANT
PACK TOTAL JOINT (CUSTOM PROCEDURE TRAY) ×2 IMPLANT
PAD CAST 4YDX4 CTTN HI CHSV (CAST SUPPLIES) IMPLANT
PADDING CAST COTTON 4X4 STRL (CAST SUPPLIES)
PASSER SUT SWANSON 36MM LOOP (INSTRUMENTS) ×2 IMPLANT
POSITIONER SURGICAL ARM (MISCELLANEOUS) ×2 IMPLANT
SCREW CANN S THRD/36 4.0 (Screw) ×2 IMPLANT
SCREW CANN S THRD/38 4.0 (Screw) ×2 IMPLANT
SPONGE LAP 18X18 X RAY DECT (DISPOSABLE) IMPLANT
SPONGE LAP 4X18 X RAY DECT (DISPOSABLE) IMPLANT
STAPLER VISISTAT (STAPLE) ×2 IMPLANT
SUT ETHIBOND NAB CT1 #1 30IN (SUTURE) IMPLANT
SUT FIBERWIRE #2 38 T-5 BLUE (SUTURE) ×4
SUT VIC AB 0 CT1 27 (SUTURE)
SUT VIC AB 0 CT1 27XBRD ANTBC (SUTURE) IMPLANT
SUT VIC AB 1 CT1 27 (SUTURE) ×1
SUT VIC AB 1 CT1 27XBRD ANTBC (SUTURE) ×1 IMPLANT
SUT VIC AB 2-0 CT1 27 (SUTURE) ×1
SUT VIC AB 2-0 CT1 TAPERPNT 27 (SUTURE) ×1 IMPLANT
SUTURE FIBERWR #2 38 T-5 BLUE (SUTURE) ×2 IMPLANT
TOWEL OR 17X26 10 PK STRL BLUE (TOWEL DISPOSABLE) ×4 IMPLANT
WATER STERILE IRR 1500ML POUR (IV SOLUTION) IMPLANT

## 2014-02-12 NOTE — Op Note (Signed)
Jennifer Estrada, BRY NO.:  [id number]  MEDICAL RECORD NO.:  [id number]  LOCATION:  [facility]                         FACILITY:  [facility]  PHYSICIAN:  [name]. Charlann Boxer, M.D.  DATE OF BIRTH:  [birth year]  DATE OF PROCEDURE:  02/12/2014 DATE OF DISCHARGE:                              OPERATIVE REPORT   PREOPERATIVE DIAGNOSIS:  Comminuted left patella fracture.  POSTOPERATIVE DIAGNOSIS:  Closed comminuted left patella fracture.  PROCEDURE:  Open reduction and internal fixation with cannulated screw fixation and FiberWire.  SURGEON:  [name]. Charlann Boxer, M.D.  ASSISTANT:  Lanney Gins, PA-C.  Note that Jennifer Estrada was present for the entirety of the case from preoperative position, perioperative management of the operative extremity, general facilitation of the case, and primary wound closure.  ANESTHESIA:  Spinal.  SPECIMENS:  None.  COMPLICATION:  None.  TOURNIQUET TIME:  56 minutes at 200 mmHg.  INDICATIONS FOR PROCEDURE:  Jennifer Estrada is a 69 year old female, who unfortunately had a fall at her home.  She landed directly on the left knee and right wrist as well as had a facial trauma with no loss of consciousness.  She was seen and evaluated at urgent care and referred to our office.  She saw Dr. Rodolph Bong who asked for my assistance from surgical management based on the comminuted displaced nature of her patella fracture.  Right short arm cast was placed for her distal radius fracture.  I saw her in the office and recommended surgical intervention based on the displacement of the fracture site.  The limitations of her fracture pattern were recognized due to the comminution with the attempt to try to improve the displacement and allow for better reapproximation of her patella as well as the discussion of posttraumatic arthritis, the patellofemoral joint reviewed.  She already has been managed her knee from an arthritic standpoint for some time  utilizing viscosupplementation related to cortisone sensitivity.  Risks, benefits, necessity of the procedure were discussed.  The risk of nonunion, need for future surgery, and again the discussion of posttraumatic arthritis reviewed and consent was obtained for benefit of fracture management.  PROCEDURE IN DETAIL:  The patient was brought to the operative theater. Once adequate anesthesia preoperative antibiotics, Ancef administered, she was positioned supine with a left thigh tourniquet placed.  Left lower extremity was then prepped and draped in sterile fashion.  A time- out was performed identifying the patient, planned procedure, and extremity.  Midline incision was utilized that would be utilized for total knee arthroplasty in the future.  Soft tissue planes were created. I then incised longitudinally the extensor mechanism retinacular exposing the patella fracture.  The patella fracture was identified and the comminuted segments identified.  I gently opened up the fracture site, the most significant transverse segment of this and irrigated the knee joint out at this point.  The fracture was then reduced using bone tenaculum under fluoroscopic imaging in AP and lateral planes.  Once I had the patella reduced in the best anatomic position possible based on the level of comminution, the technique I was going to employed at this point was a 4-0 cannulated screw technique.  I, thus, passed the guide pins  into the distal hole to the proximal pole.  These were placed in the best available bone stalk distally based on the comminution.  Once I confirmed orientation the guide pins, I drilled out of cortex, measured and selected 2 cancellous cannulated screws with short threads.  These were then passed with the fracture reduced with a tenaculum as well as with the tension to hold the fracture reduced segment.  Once this was done, I passed utilizing a suture passer, a #2 FiberWire suture  and passed this cross pattern over the anterior aspect of the knee and sutured this down to provide supplemental fixation to the remaining portion of the patella.  At this point, final radiographs were obtained.  Overall, the reduction was significantly improved from the preoperative state; however, there was still comminution and slight step- off that I felt were within acceptable limits given her fracture pattern.  Final pictures taken.  The knee was irrigated with normal saline solution.  I then reapproximated the retinacular based incision as well into the tendon with a #1 Vicryl in interrupted fashion.  The remaining wound was closed with 2-0 Vicryl and running 4-0 Monocryl.  The knee was cleaned and dried dressed sterilely using Dermabond and an Aquacel dressing.  The knee was placed in Ace wrap and a new knee immobilizer for immobilization.  Findings were reviewed with family.  She will be admitted to the hospital for therapy to make sure she steadily progressed as well as pain control.     [name] Charlann Boxer, M.D.     MDO/MEDQ  D:  02/12/2014  T:  02/12/2014  Job:  [id number]

## 2014-02-12 NOTE — Anesthesia Preprocedure Evaluation (Addendum)
Anesthesia Evaluation  Patient identified by MRN, date of birth, ID band Patient awake    Reviewed: Allergy & Precautions, H&P , Patient's Chart, lab work & pertinent test results  Airway Mallampati: II       Dental   Pulmonary  breath sounds clear to auscultation        Cardiovascular Exercise Tolerance: Good Rhythm:regular Rate:Normal     Neuro/Psych negative psych ROS   GI/Hepatic GERD-  Controlled,  Endo/Other    Renal/GU Renal disease     Musculoskeletal   Abdominal   Peds  Hematology   Anesthesia Other Findings   Reproductive/Obstetrics                            Anesthesia Physical Anesthesia Plan  ASA: II  Anesthesia Plan: Spinal   Post-op Pain Management:    Induction:   Airway Management Planned:   Additional Equipment:   Intra-op Plan:   Post-operative Plan:   Informed Consent: I have reviewed the patients History and Physical, chart, labs and discussed the procedure including the risks, benefits and alternatives for the proposed anesthesia with the patient or authorized representative who has indicated his/her understanding and acceptance.     Plan Discussed with: Anesthesiologist, CRNA and Surgeon  Anesthesia Plan Comments:         Anesthesia Quick Evaluation

## 2014-02-12 NOTE — Brief Op Note (Signed)
02/12/2014  8:40 AM  PATIENT:  Jennifer Estrada  69 y.o. female  PRE-OPERATIVE DIAGNOSIS:  Closed comminuted left patella fracture  POST-OPERATIVE DIAGNOSIS:  Closed comminuted left patella fracture  PROCEDURE:  Procedure(s): OPEN REDUCTION INTERNAL (ORIF) FIXATION LEFT PATELLA (Left)  SURGEON:  Surgeon(s) and Role:    * [name], MD - Primary  PHYSICIAN ASSISTANT: Lanney Gins, PA-C  ANESTHESIA:   spinal  EBL:   Minimal  BLOOD ADMINISTERED:none  DRAINS: none   LOCAL MEDICATIONS USED:  MARCAINE     SPECIMEN:  No Specimen  DISPOSITION OF SPECIMEN:  N/A  COUNTS:  YES  TOURNIQUET:    DICTATION: .Other Dictation: Dictation Number [id number]  PLAN OF CARE: Admit to inpatient   PATIENT DISPOSITION:  PACU - hemodynamically stable.   Delay start of Pharmacological VTE agent (>24hrs) due to surgical blood loss or risk of bleeding: no

## 2014-02-12 NOTE — Progress Notes (Signed)
Dr. Charlann Boxer made aware via phone of pt's increased pain as well as indigestion. See new orders received.

## 2014-02-12 NOTE — Transfer of Care (Signed)
Immediate Anesthesia Transfer of Care Note  Patient: Jennifer Estrada  Procedure(s) Performed: Procedure(s): OPEN REDUCTION INTERNAL (ORIF) FIXATION LEFT PATELLA (Left)  Patient Location: PACU  Anesthesia Type:Spinal  Level of Consciousness: awake, oriented, patient cooperative, lethargic and responds to stimulation  Airway & Oxygen Therapy: Patient Spontanous Breathing and Patient connected to face mask oxygen  Post-op Assessment: Report given to PACU RN, Post -op Vital signs reviewed and stable and Patient moving all extremities  Post vital signs: Reviewed and stable  Complications: No apparent anesthesia complications

## 2014-02-12 NOTE — Anesthesia Procedure Notes (Signed)
Spinal Patient location during procedure: OR Start time: 02/12/2014 9:44 AM Staffing Anesthesiologist: Brayton Caves Performed by: anesthesiologist  Preanesthetic Checklist Completed: patient identified, site marked, surgical consent, pre-op evaluation, timeout performed, IV checked, risks and benefits discussed and monitors and equipment checked Spinal Block Patient position: sitting Prep: DuraPrep Patient monitoring: heart rate, cardiac monitor, continuous pulse ox and blood pressure Approach: midline Location: L3-4 Injection technique: single-shot Needle Needle type: Sprotte  Needle gauge: 24 G Needle length: 9 cm Assessment Sensory level: T4 Additional Notes Patient identified.  Risk benefits discussed including failed block, incomplete pain control, headache, nerve damage, paralysis, blood pressure changes, nausea, vomiting, reactions to medication both toxic or allergic, and postpartum back pain.  Patient expressed understanding and wished to proceed.  All questions were answered.  Sterile technique used throughout procedure.  CSF was clear.  No parasthesia or other complications.  Please see nursing notes for vital signs.

## 2014-02-12 NOTE — Anesthesia Postprocedure Evaluation (Signed)
  Anesthesia Post-op Note  Patient: Jennifer Estrada  Procedure(s) Performed: Procedure(s): OPEN REDUCTION INTERNAL (ORIF) FIXATION LEFT PATELLA (Left)  Patient Location: PACU  Anesthesia Type:Spinal  Level of Consciousness: awake, alert , oriented, patient cooperative and responds to stimulation  Airway and Oxygen Therapy: Patient Spontanous Breathing and Patient connected to nasal cannula oxygen  Post-op Pain: none  Post-op Assessment: Post-op Vital signs reviewed and Patient's Cardiovascular Status Stable  Post-op Vital Signs: Reviewed and stable  Last Vitals:  Filed Vitals:   02/12/14 1215  BP:   Pulse: 63  Temp:   Resp: 14    Complications: No apparent anesthesia complications

## 2014-02-12 NOTE — H&P (Addendum)
Jennifer Estrada is an 69 y.o. female.    Chief Complaint:  Left patella fracture   HPI: Pt is a 69 y.o. female had a ground level fall landing on her left knee and right wrist.  She had immediate pain and inability to bear weight and went to an urgent care.  Radiographs revealed left patella fracture and non-displaced right distal radius fracture.  She was then told to follow up in our office.  She saw Dr. Rodolph Bong who brought her to my attention for evaluation.  Other than pain in the right wrist and left knee and other generalized soreness from the fall she was stable  Has had history of knee arthritis in past addressed at other Orthopaedic office with injection series  PCP:  Ginette Otto, MD  D/C Plans: To be determined following appropriate treatment plan  PMH: Past Medical History  Diagnosis Date  . Allergic rhinitis   . Osteoporosis   . Depression   . Hyperlipidemia   . GERD (gastroesophageal reflux disease)   . Vitamin D deficiency   . DJD (degenerative joint disease) of knee   . Chronic kidney disease   . Tremors of nervous system   . Migraine     Takes Propranolol  . Neuromuscular disorder     Body tremors at times,h/o falling cast right arm    PSH: Past Surgical History  Procedure Laterality Date  . Left foot surgery  20 years ago    20 years ago    Social History:  reports that she has never smoked. She has never used smokeless tobacco. She reports that she does not drink alcohol or use illicit drugs.  Allergies:  Allergies  Allergen Reactions  . Atorvastatin Other (See Comments)    Feels anxous  . Cortisone Other (See Comments)    headache  . Paroxetine Other (See Comments)    anxiety  . Penicillins   . Pravastatin Other (See Comments)    muscle pain   . Singulair [Montelukast] Other (See Comments)    Chest pain  . Sulfa Antibiotics Nausea Only  . Augmentin [Amoxicillin-Pot Clavulanate] Rash  . Prednisone Other (See Comments)   headaches    Medications: Medications Prior to Admission  Medication Sig Dispense Refill  . Calcium Carbonate-Vitamin D (CALCIUM + D PO) Take 1 tablet by mouth daily.    Marland Kitchen dexlansoprazole (DEXILANT) 60 MG capsule Take 60 mg by mouth daily.    . magnesium 30 MG tablet Take 30 mg by mouth 2 (two) times daily.    Marland Kitchen oxyCODONE-acetaminophen (PERCOCET/ROXICET) 5-325 MG per tablet Take 1 tablet by mouth every 4 (four) hours as needed. (Patient taking differently: Take 1 tablet by mouth every 4 (four) hours as needed for moderate pain. ) 30 tablet 0  . PARoxetine (PAXIL) 20 MG tablet Take 20 mg by mouth every morning.    . propranolol ER (INDERAL LA) 80 MG 24 hr capsule Take 1 capsule (80 mg total) by mouth daily. 30 capsule 0  . rosuvastatin (CRESTOR) 5 MG tablet Take 5 mg by mouth daily.    . prochlorperazine (COMPAZINE) 10 MG tablet Take 1 tablet (10 mg total) by mouth every 6 (six) hours as needed. (Patient not taking: Reported on 02/07/2014) 30 tablet 0    Results for orders placed or performed during the hospital encounter of 02/12/14 (from the past 48 hour(s))  CBC     Status: Abnormal   Collection Time: 02/12/14  7:15 AM  Result Value Ref  Range   WBC 8.7 4.0 - 10.5 K/uL   RBC 3.82 (L) 3.87 - 5.11 MIL/uL   Hemoglobin 11.6 (L) 12.0 - 15.0 g/dL   HCT 52.8 41.3 - 24.4 %   MCV 94.2 78.0 - 100.0 fL   MCH 30.4 26.0 - 34.0 pg   MCHC 32.2 30.0 - 36.0 g/dL   RDW 01.0 27.2 - 53.6 %   Platelets 249 150 - 400 K/uL  Protime-INR     Status: None   Collection Time: 02/12/14  7:15 AM  Result Value Ref Range   Prothrombin Time 14.6 11.6 - 15.2 seconds   INR 1.12 0.00 - 1.49  APTT     Status: None   Collection Time: 02/12/14  7:15 AM  Result Value Ref Range   aPTT 29 24 - 37 seconds   No results found.  ROS: Review of Systems - Negative except that noted in HPI at presentation to office General ROS: negative for - chills, fatigue, night sweats or weight loss Psychological ROS: negative for -  behavioral disorder, physical abuse or sleep disturbances Respiratory ROS: no cough, shortness of breath, or wheezing Cardiovascular ROS: no chest pain or dyspnea on exertion Gastrointestinal ROS: no abdominal pain, change in bowel habits, or black or bloody stools Musculoskeletal ROS: positive for - joint pain and joint stiffness  Physican Exam: Blood pressure 120/57, pulse 64, temperature 98 F (36.7 C), temperature source Oral, resp. rate 18, height  (1.676 m), weight 72.576 kg (160 lb), SpO2 100 %. .physicalexam Awake alert Knee immobilizer to left knee No cuts lacerations or abrasions over anterior left knee LLE NVI, calf soft Right wrist in cast  Bruising around eyes from fall on to her face at time of injury No reported loss of consciousness at time of injury No headaches  No other bruising noted Chest clear Heart normal rhythm Abdomen soft    Assessment/Plan Assessment: Closed comminuted left patella fracture   Plan: Patient will undergo an open reduction internal fixation of her left patella fracture on Saturday the 2nd of January. Risks benefits and expectation were discussed with the patient. Patient understand risks, benefits and expectation and wishes to proceed.  Will plan on admission for pain control and therapy.  She will need a walker with elevated arm rest for right (contralateral) arm Knee in extension for 2-3 weeks then allow for knee movement pending operative findings   [name]. Charlann Boxer, MD  02/12/2014, 7:42 AM

## 2014-02-12 NOTE — Progress Notes (Signed)
Dr. Charlann Boxer aware via phone pt complained of back & side pain. Pt has not voided since 4 am reported husband. Bladder scanned pt at 1330 with 621 ml urine resulting. See new orders received to in and out cath every 8h prn.

## 2014-02-12 NOTE — Progress Notes (Addendum)
Prep/clip not performed in short stay because of left knee injury and leg is immobilized and also left knee pain.  Did not obtain u/a because pt unable to void upon arrival.  Mobility is not good either with left knee injury.  Dr.Olin was informed of all this and he is okay with not doing prior to going to OR.

## 2014-02-13 NOTE — Progress Notes (Signed)
   Subjective: 1 Day Post-Op Procedure(s) (LRB): OPEN REDUCTION INTERNAL (ORIF) FIXATION LEFT PATELLA (Left)  Pt c/o moderate to severe pain to left knee s/p patella fracture Right arm in cast is doing well Pt worried about the pain when she starts to weight bearing  Patient reports pain as severe.  Objective:   VITALS:   Filed Vitals:   02/13/14 0553  BP: 126/69  Pulse: 70  Temp: 97.8 F (36.6 C)  Resp: 16    Left knee dressing intact  nv intact distally No rashes or edema  LABS  Recent Labs  02/12/14 0715  HGB 11.6*  HCT 36.0  WBC 8.7  PLT 249     Recent Labs  02/12/14 0715  NA 140  K 3.6  BUN 12  CREATININE 0.86  GLUCOSE 100*     Assessment/Plan: 1 Day Post-Op Procedure(s) (LRB): OPEN REDUCTION INTERNAL (ORIF) FIXATION LEFT PATELLA (Left) PT/OT Pain control as needed Possible d/c tomorrow depending on her progress Incentive spirometry   Brad Twanisha Foulk, MPAS, PA-C  02/13/2014, 7:23 AM

## 2014-02-13 NOTE — Evaluation (Signed)
Physical Therapy Evaluation Patient Details Name: Jennifer Estrada MRN: [id number] DOB: 1945-11-21 Today's Date: 02/13/2014   History of Present Illness  69 yo female adm 02/12/14, s/p ORIF L patella fx, R distal radius fx, immobilized with lower arm cast; PMHx: chronic kidney dz  Clinical Impression  Pt admitted with above diagnosis. Pt currently with functional limitations due to the deficits listed below (see PT Problem List).  Pt will benefit from skilled PT to increase their independence and safety with mobility to allow discharge to the venue listed below.  Pt requiring extensive assist today, +2 for basic mobility, pain is an issue (she had all allowable meds prior to time of PT eval today).  Husband would be unable to manage pt at her current status, discussed with pt and husband; Recommend SNF level therapies post acute. Will continue to follow    Follow Up Recommendations SNF;Supervision/Assistance - 24 hour    Equipment Recommendations  Rolling walker with 5" wheels (with platform)    Recommendations for Other Services       Precautions / Restrictions Precautions Precautions: Fall Precaution Comments: NO Left knee flexion Required Braces or Orthoses: Knee Immobilizer - Left Knee Immobilizer - Left: On at all times Restrictions LLE Weight Bearing: Weight bearing as tolerated Other Position/Activity Restrictions: platform ok RUE      Mobility  Bed Mobility Overal bed mobility: Needs Assistance Bed Mobility: Supine to Sit     Supine to sit: +2 for physical assistance;Max assist     General bed mobility comments: multi-modal cues for technique, safety, +2 for trunk and LEs, pt pushing posteriorly, screaming and not assisting self ( pt was able to long sit in bed 3 x of her own volition prior to EOB  with PT)  Transfers Overall transfer level: Needs assistance Equipment used: Right platform walker Transfers: Sit to/from Stand Sit to Stand: +2 physical assistance;Mod  assist;+2 safety/equipment;From elevated surface         General transfer comment: multimodal cues for  hand placement, wt shift, safety, breathing, to keep eyes open; +2  for anterior superior wt shift adn balance once standing  Ambulation/Gait Ambulation/Gait assistance: +2 physical assistance;+2 safety/equipment;Mod assist;Min assist Ambulation Distance (Feet): 12 Feet Assistive device: Right platform walker Gait Pattern/deviations: Step-to pattern;Trunk flexed;Decreased weight shift to left;Decreased step length - right;Decreased step length - left;Antalgic     General Gait Details: multi-modal repetitious cues for all aspects of gait; Pt has difficulty attending to task and requires frequent re-direction from her random thoughts and stories  Stairs            Wheelchair Mobility    Modified Rankin (Stroke Patients Only)       Balance Overall balance assessment: History of Falls;Needs assistance   Sitting balance-Leahy Scale: Poor       Standing balance-Leahy Scale: Zero                               Pertinent Vitals/Pain Pain Assessment: 0-10 Pain Score: 8  Pain Location: left knee Pain Intervention(s): Limited activity within patient's tolerance;Monitored during session;Repositioned    Home Living Family/patient expects to be discharged to:: Private residence Living Arrangements: Spouse/significant other   Type of Home: House Home Access: Stairs to enter Entrance Stairs-Rails: Right Entrance Stairs-Number of Steps: 5 back  Home Layout: One level Home Equipment: Bedside commode      Prior Function Level of Independence: Independent  Comments: I, drives     Hand Dominance   Dominant Hand: Right    Extremity/Trunk Assessment   Upper Extremity Assessment: Defer to OT evaluation;RUE deficits/detail           Lower Extremity Assessment: LLE deficits/detail         Communication   Communication: No difficulties   Cognition Arousal/Alertness: Awake/alert Behavior During Therapy: Restless;Anxious Overall Cognitive Status: Difficult to assess Area of Impairment: Following commands;Safety/judgement;Awareness;Problem solving     Memory: Decreased recall of precautions;Decreased short-term memory Following Commands: Follows one step commands inconsistently Safety/Judgement: Decreased awareness of safety;Decreased awareness of deficits   Problem Solving: Slow processing;Difficulty sequencing;Requires verbal cues;Requires tactile cues      General Comments      Exercises        Assessment/Plan    PT Assessment Patient needs continued PT services  PT Diagnosis Difficulty walking   PT Problem List Decreased strength;Decreased activity tolerance;Decreased balance;Decreased mobility;Decreased knowledge of use of DME;Decreased knowledge of precautions;Decreased safety awareness  PT Treatment Interventions DME instruction;Gait training;Functional mobility training;Therapeutic activities;Therapeutic exercise;Patient/family education   PT Goals (Current goals can be found in the Care Plan section) Acute Rehab PT Goals Patient Stated Goal: none stated PT Goal Formulation: With patient Time For Goal Achievement: 02/27/14 Potential to Achieve Goals: Good    Frequency Min 4X/week   Barriers to discharge        Co-evaluation               End of Session Equipment Utilized During Treatment: Gait belt Activity Tolerance: Patient limited by fatigue;Patient limited by pain Patient left: in chair;with call bell/phone within reach;with family/visitor present Nurse Communication: Mobility status         Time: 6962-9528 PT Time Calculation (min) (ACUTE ONLY): 30 min   Charges:   PT Evaluation $Initial PT Evaluation Tier I: 1 Procedure PT Treatments $Gait Training: 8-22 mins $Therapeutic Activity: 8-22 mins   PT G Codes:        Conal Shetley 03/09/14, 1:39 PM

## 2014-02-14 ENCOUNTER — Encounter (HOSPITAL_COMMUNITY): Payer: Self-pay | Admitting: Orthopedic Surgery

## 2014-02-14 MED ORDER — DSS 100 MG PO CAPS: 100.0000 mg | ORAL_CAPSULE | Freq: Two times a day (BID) | ORAL | Status: DC

## 2014-02-14 MED ORDER — ACETAMINOPHEN 500 MG PO TABS: 1000.0000 mg | ORAL_TABLET | Freq: Three times a day (TID) | ORAL | Status: DC

## 2014-02-14 MED ORDER — OXYCODONE HCL 5 MG PO TABS: 5.0000 mg | ORAL_TABLET | ORAL | Status: DC | PRN

## 2014-02-14 MED ORDER — TIZANIDINE HCL 4 MG PO TABS
4.0000 mg | ORAL_TABLET | Freq: Four times a day (QID) | ORAL | Status: DC | PRN
Start: 2014-02-14 — End: 2017-02-21

## 2014-02-14 MED ORDER — POLYETHYLENE GLYCOL 3350 17 G PO PACK: 17.0000 g | PACK | Freq: Two times a day (BID) | ORAL | Status: DC

## 2014-02-14 MED ORDER — LORAZEPAM 0.5 MG PO TABS
0.5000 mg | ORAL_TABLET | Freq: Three times a day (TID) | ORAL | Status: DC | PRN
  Administered 2014-02-14: 0.5 mg via ORAL
  Filled 2014-02-14: qty 1

## 2014-02-14 MED ORDER — LORAZEPAM 2 MG/ML IJ SOLN
INTRAMUSCULAR | Status: AC
  Filled 2014-02-14: qty 1

## 2014-02-14 MED ORDER — HYDROCODONE-ACETAMINOPHEN 7.5-325 MG PO TABS: 1.0000 | ORAL_TABLET | ORAL | Status: DC | PRN

## 2014-02-14 MED ORDER — TIZANIDINE HCL 4 MG PO TABS: 4.0000 mg | ORAL_TABLET | Freq: Four times a day (QID) | ORAL | Status: DC | PRN

## 2014-02-14 MED ORDER — HYDROCODONE-ACETAMINOPHEN 7.5-325 MG PO TABS
1.0000 | ORAL_TABLET | ORAL | Status: DC | PRN
Start: 2014-02-14 — End: 2014-02-15
  Administered 2014-02-14 – 2014-02-15 (×4): 1 via ORAL
  Filled 2014-02-14 (×4): qty 1

## 2014-02-14 NOTE — Progress Notes (Signed)
OT Cancellation Note  Patient Details Name: Jennifer Estrada MRN: [id number] DOB: [birth year]   Cancelled Treatment:    Reason Eval/Treat Not Completed:Pt eating lunch, will reattempt.   Angelene Giovanni [place], OTR/L [phone]  02/14/2014, 2:23 PM

## 2014-02-14 NOTE — Progress Notes (Signed)
Patient ID: Jennifer Estrada, female   DOB: 1945/04/01, 69 y.o.   MRN: [id number] Subjective: 2 Days Post-Op Procedure(s) (LRB): OPEN REDUCTION INTERNAL (ORIF) FIXATION LEFT PATELLA (Left)    Patient reports pain as moderate.  She was feeling nauseated this am.  Some work with therapy yesterday  Objective:   VITALS:   Filed Vitals:   02/14/14 0924  BP: 138/114  Pulse: 81  Temp:   Resp: 16    Neurovascular intact Incision: dressing C/D/I  LABS  Recent Labs  02/12/14 0715  HGB 11.6*  HCT 36.0  WBC 8.7  PLT 249     Recent Labs  02/12/14 0715  NA 140  K 3.6  BUN 12  CREATININE 0.86  GLUCOSE 100*     Recent Labs  02/12/14 0715  INR 1.12     Assessment/Plan: 2 Days Post-Op Procedure(s) (LRB): OPEN REDUCTION INTERNAL (ORIF) FIXATION LEFT PATELLA (Left)   Advance diet as tolerable Up with therapy Discharge home with home health today or tomorrow  Will have her fit with a T-ROM brace locked in extension  Discharge status to be determined based on therapy progress

## 2014-02-14 NOTE — Care Management Note (Addendum)
    Page 1 of 2   02/15/2014     1:10:25 PM CARE MANAGEMENT NOTE 02/15/2014  Patient:  Jennifer Estrada, Jennifer Estrada   Account Number:  [id number]  Date Initiated:  02/13/2014  Documentation initiated by:  [facility]  Subjective/Objective Assessment:   OPEN REDUCTION INTERNAL (ORIF) FIXATION LEFT PATELLA     Action/Plan:   Anticipated DC Date:  02/15/2014   Anticipated DC Plan:  HOME W HOME HEALTH SERVICES  In-house referral  Clinical Social Worker      DC Associate Professor  CM consult      [facility] Choice  HOME HEALTH  DURABLE MEDICAL EQUIPMENT   Choice offered to / List presented to:  C-1 Patient   DME arranged  Levan Hurst      DME agency  [facility]     HH arranged  HH-2 PT      [facility] agency  [facility]   Status of service:  Completed, signed off Medicare Important Message given?   (If response is "NO", the following Medicare IM given date fields will be blank) Date Medicare IM given:   Medicare IM given by:   Date Additional Medicare IM given:   Additional Medicare IM given by:    Discharge Disposition:  HOME W HOME HEALTH SERVICES  Per UR Regulation:  Reviewed for med. necessity/level of care/duration of stay  If discussed at Long Length of Stay Meetings, dates discussed:    Comments:  02-15-14 Lorenda Ishihara RN CM 1300 Patient no longer wants to d/c to SNF, wants to return home, spouse will be available to assist. Spoke with patient and spouse at bedside. Chose [facility] for [facility] services, contacted [facility] to arrange, DME ordered.

## 2014-02-14 NOTE — Evaluation (Signed)
Occupational Therapy Evaluation Patient Details Name: Jennifer Estrada MRN: [id number] DOB: 07/13/1945 Today's Date: 02/14/2014    History of Present Illness 69 yo female adm 02/12/14, s/p ORIF L patella fx, R distal radius fx, immobilized with lower arm cast; PMHx: chronic kidney dz   Clinical Impression   Pt admitted with above. She demonstrates the below listed deficits and will benefit from continued OT to maximize safety and independence with BADLs.  Pt very confused this pm.  She was attempting to climb out of bed to go home and get away from her spouse.   She required max verbal and tactile cues for problem solving, sequencing, and safety.  She was able to transfer to [facility] with mod A +2 and progressed to ambulating to recliner ~5' with min A +2.   She will need SNF level rehab at discharge.       Follow Up Recommendations  SNF    Equipment Recommendations  3 in 1 bedside comode    Recommendations for Other Services       Precautions / Restrictions Precautions Precautions: Fall Precaution Comments: NO Left knee flexion Required Braces or Orthoses: Other Brace/Splint (knee extension brace ) Other Brace/Splint: on at all times Restrictions Weight Bearing Restrictions: No LLE Weight Bearing: Weight bearing as tolerated Other Position/Activity Restrictions: platform ok RUE      Mobility Bed Mobility Overal bed mobility: Needs Assistance Bed Mobility: Supine to Sit     Supine to sit: +2 for physical assistance;Max assist     General bed mobility comments: Pt requires step by step cuing to problem solving and sequencing.  Mod A + 1 to move to EOB and +2 for safety due to pt's confusion   Transfers Overall transfer level: Needs assistance Equipment used: Right platform walker Transfers: Sit to/from Stand;Stand Pivot Transfers Sit to Stand: Mod assist;Min assist;+2 physical assistance Stand pivot transfers: Mod assist;Min assist;+2 physical assistance       General  transfer comment: Pt required max multimodal cues for sequencing and problem solving.  She initially required mod A +2, and progressed to min A +2 by the end of session     Balance Overall balance assessment: Needs assistance Sitting-balance support: Feet supported Sitting balance-Leahy Scale: Poor       Standing balance-Leahy Scale: Poor                              ADL Overall ADL's : Needs assistance/impaired Eating/Feeding: Set up;Bed level;Sitting   Grooming: Wash/dry hands;Wash/dry face;Oral care;Brushing hair;Set up;Sitting;Bed level   Upper Body Bathing: Minimal assitance;Sitting   Lower Body Bathing: Maximal assistance;Sit to/from stand   Upper Body Dressing : Minimal assistance;Sitting   Lower Body Dressing: Total assistance;Sit to/from stand   Toilet Transfer: Moderate assistance;+2 for physical assistance;Minimal assistance Toilet Transfer Details (indicate cue type and reason): Pt initially requiring mod A +2, but as pt was able to cognitively focus better on the task at hand, progressed to min A +2 Toileting- Clothing Manipulation and Hygiene: Total assistance;Sit to/from stand       Functional mobility during ADLs: Minimal assistance;Moderate assistance;+2 for physical assistance General ADL Comments: Pt assisted to EOB with assist from RN.  Worked on standing and side stepping up EOB due to pt instability and impaired cognition - pt was initially unsafe to progress further due to severity of cognitive deficits     Vision  Perception     Praxis      Pertinent Vitals/Pain Pain Assessment: Faces Faces Pain Scale: Hurts little more Pain Location: Lt knee with transitions Pain Intervention(s): Monitored during session;Repositioned     Hand Dominance Right   Extremity/Trunk Assessment Upper Extremity Assessment Upper Extremity Assessment: RUE deficits/detail RUE Deficits / Details: Pt with wrist fracture in hard  cast    Lower Extremity Assessment Lower Extremity Assessment: Defer to PT evaluation       Communication Communication Communication: No difficulties   Cognition Arousal/Alertness: Awake/alert Behavior During Therapy: Restless;Anxious;Impulsive Overall Cognitive Status: Impaired/Different from baseline Area of Impairment: Safety/judgement;Awareness;Problem solving;Attention     Memory: Decreased recall of precautions;Decreased short-term memory Following Commands: Follows one step commands inconsistently Safety/Judgement: Decreased awareness of safety;Decreased awareness of deficits   Problem Solving: Slow processing;Decreased initiation;Difficulty sequencing;Requires verbal cues;Requires tactile cues General Comments: Upon therapist's entrance, pt attempting to climb out of bed saying that she has been here since 10:00 this morning waiting on her therapy apt and she needs to leave, and that she needs to get away from her spouse.  Pt requires max verbal and tactile cues to sustain attention,for problem solving.  Pt with poor awareness of deficits.   requires max cues to avoid weight bearing through Rt. hand/wrist and when cued states "I don't know why I can't use it".      General Comments       Exercises       Shoulder Instructions      Home Living Family/patient expects to be discharged to:: Skilled nursing facility Living Arrangements: Spouse/significant other   Type of Home: House Home Access: Stairs to enter Entrance Stairs-Number of Steps: 5 back  Entrance Stairs-Rails: Right Home Layout: One level               Home Equipment: Bedside commode          Prior Functioning/Environment Level of Independence: Independent        Comments: I, drives    OT Diagnosis: Generalized weakness;Cognitive deficits;Acute pain   OT Problem List: Decreased strength;Decreased activity tolerance;Impaired balance (sitting and/or standing);Decreased cognition;Decreased  safety awareness;Decreased knowledge of use of DME or AE;Decreased knowledge of precautions;Pain;Impaired UE functional use   OT Treatment/Interventions: Self-care/ADL training;DME and/or AE instruction;Therapeutic activities;Cognitive remediation/compensation;Patient/family education;Balance training    OT Goals(Current goals can be found in the care plan section) Acute Rehab OT Goals Patient Stated Goal: to walk  OT Goal Formulation: With patient Time For Goal Achievement: 02/28/14 Potential to Achieve Goals: Good ADL Goals Pt Will Perform Upper Body Bathing: with set-up;sitting;with supervision Pt Will Perform Lower Body Bathing: with min assist;sit to/from stand Pt Will Perform Upper Body Dressing: with set-up;with supervision;sitting Pt Will Perform Lower Body Dressing: with min assist;with adaptive equipment;sit to/from stand Pt Will Transfer to Toilet: with min assist;ambulating;regular height toilet;bedside commode;grab bars Pt Will Perform Toileting - Clothing Manipulation and hygiene: with min assist;sit to/from stand  OT Frequency: Min 2X/week   Barriers to D/C: Decreased caregiver support  doubt husband can safely assist her       Co-evaluation              End of Session Equipment Utilized During Treatment: Rolling walker (with platform Rt) Nurse Communication: Mobility status  Activity Tolerance: Patient tolerated treatment well Patient left: in bed;with call bell/phone within reach;with bed alarm set;with family/visitor present;with nursing/sitter in room   Time: 1610-9604 OT Time Calculation (min): 30 min Charges:  OT General Charges $OT Visit: 1  Procedure OT Evaluation $Initial OT Evaluation Tier I: 1 Procedure OT Treatments $Self Care/Home Management : 8-22 mins $Therapeutic Activity: 8-22 mins G-Codes:    Tyreck Bell M 2014/03/01, 5:27 PM

## 2014-02-14 NOTE — Progress Notes (Signed)
Clinical Social Work Department CLINICAL SOCIAL WORK PLACEMENT NOTE 02/14/2014  Patient:  Jennifer Estrada, Jennifer Estrada  Account Number:  [id number] Admit date:  02/12/2014  Clinical Social Worker:  Cori Razor, LCSW  Date/time:  02/14/2014 04:05 PM  Clinical Social Work is seeking post-discharge placement for this patient at the following level of care:   SKILLED NURSING   (*CSW will update this form in Epic as items are completed)   02/14/2014  Patient/family provided with [facility] Department of Clinical Social Work's list of facilities offering this level of care within the geographic area requested by the patient (or if unable, by the patient's family).  02/14/2014  Patient/family informed of their freedom to choose among providers that offer the needed level of care, that participate in Medicare, Medicaid or managed care program needed by the patient, have an available bed and are willing to accept the patient.    Patient/family informed of [facility]' ownership interest in [facility], as well as of the fact that they are under no obligation to receive care at this facility.  PASARR submitted to EDS on 02/14/2014 PASARR number received on 02/14/2014  FL2 transmitted to all facilities in geographic area requested by pt/family on  02/14/2014 FL2 transmitted to all facilities within larger geographic area on   Patient informed that his/her managed care company has contracts with or will negotiate with  certain facilities, including the following:     Patient/family informed of bed offers received:  02/14/2014 Patient chooses bed at  Physician recommends and patient chooses bed at    Patient to be transferred to  on   Patient to be transferred to facility by  Patient and family notified of transfer on  Name of family member notified:    The following physician request were entered in Epic:   Additional Comments:  Cori Razor LCSW

## 2014-02-14 NOTE — Progress Notes (Signed)
Clinical Social Work Department BRIEF PSYCHOSOCIAL ASSESSMENT 02/14/2014  Patient:  Jennifer Estrada, Jennifer Estrada     Account Number:  [id number]     Admit date:  02/12/2014  Clinical Social Worker:  Lacie Scotts  Date/Time:  02/14/2014 03:56 PM  Referred by:  CSW  Date Referred:  02/14/2014 Referred for  SNF Placement   Other Referral:   Interview type:  Patient Other interview type:    PSYCHOSOCIAL DATA Living Status:  HUSBAND Admitted from facility:   Level of care:   Primary support name:  Thayer Jew Primary support relationship to patient:  SPOUSE Degree of support available:   supportive    CURRENT CONCERNS Current Concerns  Post-Acute Placement   Other Concerns:    SOCIAL WORK ASSESSMENT / PLAN Pt is 69 yr old female living at home with spouse prior to hospitalization. CSW consulted for possible SNF placement. PN reviewed . Pt fell, fx her hip which required surgery. PT is recommending SNF vs 24/7 supervision . CSW met with pt to assist with d/c planning. Pt allowed csw to initiate SNF search. Bed offers have been reviewed with pt. Her decision is pending.   Assessment/plan status:  Psychosocial Support/Ongoing Assessment of Needs Other assessment/ plan:   Information/referral to community resources:   Insurance coverage for SNF and ambulance transport reviewed.    PATIENT'S/FAMILY'S RESPONSE TO PLAN OF CARE: Pt would prefer to return home following hospital d/c. She reports her husband is available to offer support 24/7. Benefits of rehab placement vs HHPT reviewed with pt. She will consider this option. Pt will contact csw with her decision after she discusses placement with her husband.    Werner Lean LCSW [phone]

## 2014-02-15 NOTE — Plan of Care (Signed)
Problem: Phase I Progression Outcomes Goal: OOB as tolerated unless otherwise ordered Outcome: Completed/Met Date Met:  02/15/14 Pt ambulated with PT this morning  Problem: Phase III Progression Outcomes Goal: IV changed to normal saline lock Outcome: Not Applicable Date Met:  33/35/45 Pt no longer has an IV.   Problem: Discharge Progression Outcomes Goal: Barriers To Progression Addressed/Resolved Outcome: Completed/Met Date Met:  02/15/14 Pt cognitive status back to baseline.  Goal: Discharge plan in place and appropriate Outcome: Completed/Met Date Met:  02/15/14 Pt to be discharged to home vs SNF.  Goal: Tolerating diet Outcome: Completed/Met Date Met:  02/15/14 Pt eating meals appropriately with no difficulties.  Goal: Activity appropriate for discharge plan Outcome: Completed/Met Date Met:  02/15/14 Pt ambulating to bathroom and in hallways.

## 2014-02-15 NOTE — Progress Notes (Signed)
Patient ID: Jennifer MillinerBrenda L Divelbiss, female   DOB: [birth year], 69 y.o.   MRN: [id number] Subjective: 3 Days Post-Op Procedure(s) (LRB): OPEN REDUCTION INTERNAL (ORIF) FIXATION LEFT PATELLA (Left)    Patient reports pain as moderate.  Doing better today after changing meds back to Norco  Objective:   VITALS:   Filed Vitals:   02/15/14 0605  BP: 109/62  Pulse: 81  Temp: 98.9 F (37.2 C)  Resp: 18    Neurovascular intact Incision: dressing C/D/I  Left knee in T-ROM brace locked in extension  LABS No results for input(s): HGB, HCT, WBC, PLT in the last 72 hours.  No results for input(s): NA, K, BUN, CREATININE, GLUCOSE in the last 72 hours.  No results for input(s): LABPT, INR in the last 72 hours.   Assessment/Plan: 3 Days Post-Op Procedure(s) (LRB): OPEN REDUCTION INTERNAL (ORIF) FIXATION LEFT PATELLA (Left)   Up with therapy Discharge home with home health today RTC in 2 weeks  Maintain brace in extension

## 2014-02-15 NOTE — Progress Notes (Signed)
Nurse reviewed discharge instructions with pt and husband.  Pt and husband verbalized understanding of discharge instructions, follow up appointments and new medications.  No concerns at time of discharge.

## 2014-02-15 NOTE — Progress Notes (Signed)
CSW assisting with d/c planning. Pt has declined SNF placement. Spouse is aware and will provided home support. RNCM will assist with d/c planning. CSW signing off.  Cori RazorJamie Freeland Pracht LCSW [phone]

## 2014-02-15 NOTE — Progress Notes (Signed)
Physical Therapy Treatment Patient Details Name: Jennifer Estrada MRN: [id number] DOB: June 10, 1945 Today's Date: 02/15/2014    History of Present Illness 69 yo female adm 02/12/14, s/p ORIF L patella fx, R distal radius fx, immobilized with lower arm cast; PMHx: chronic kidney dz    PT Comments    Pt declining any rec for SNF.  Spouse in room and plans to provide 24/7 care at home.  Had spouse assist pt OOB, amb to and from bathroom and practice stairs under direction of Family Education/safe handling techniques.  All mobility/therapy questions addressed.  Follow Up Recommendations  Home health PT (pt declining any rec for SNF (will update LPT))     Equipment Recommendations  Rolling walker with 5" wheels;Other (comment) (R platform)    Recommendations for Other Services       Precautions / Restrictions Precautions Precautions: Fall Precaution Comments: NO Left knee flexion Required Braces or Orthoses: Other Brace/Splint (lock hinge knee extension brace) Knee Immobilizer - Left: On at all times Restrictions Weight Bearing Restrictions: No LLE Weight Bearing: Weight bearing as tolerated Other Position/Activity Restrictions: platform ok RUE    Mobility  Bed Mobility Overal bed mobility: Needs Assistance             General bed mobility comments: had spouse asssit pt OOB with 25% VC's on safe handling.   greatest difficulty was scooting to EOB due to casted R UE.  Transfers Overall transfer level: Needs assistance Equipment used: Right platform walker;Rolling walker (2 wheeled) Transfers: Sit to/from Stand Sit to Stand: Min assist         General transfer comment: had spouse asssit pt off bed and on/off raised toilet with 25% VC's on proper tech and safe handling.  Pt required 50% VC's on proper hand placement and turn completion.  Ambulation/Gait Ambulation/Gait assistance: Min guard;Min assist Ambulation Distance (Feet): 24 Feet (12 feet x 2 to and from  BR) Assistive device: Right platform walker Gait Pattern/deviations: Step-to pattern;Trunk flexed;Decreased stance time - left;Narrow base of support Gait velocity: decreased   General Gait Details: had spouse amb pt to and from BR with 25% VC's on safe handling and direction on proper walker placement esp with turns.  Pt able to perform with increased time and 50% VC's on proper upright/proper walker to self distance as pt tended to push walker too far out front.    Stairs Stairs: Yes Stairs assistance: Mod assist Stair Management: One rail Left Number of Stairs: 4 General stair comments: has spouse assist pt up/down four steps with 50% VC's on proper tech and safe handling.  Pt required 50% VC's due to impaired safety cognition and increased fatigue.  Wheelchair Mobility    Modified Rankin (Stroke Patients Only)       Balance                                    Cognition                            Exercises      General Comments        Pertinent Vitals/Pain Pain Assessment: No/denies pain Pain Intervention(s): Monitored during session;Repositioned    Home Living                      Prior Function  PT Goals (current goals can now be found in the care plan section) Progress towards PT goals: Progressing toward goals    Frequency  Min 4X/week    PT Plan      Co-evaluation             End of Session Equipment Utilized During Treatment: Gait belt Activity Tolerance: Patient limited by fatigue Patient left: in chair;with call bell/phone within reach;with family/visitor present     Time: 0912-0953 PT Time Calculation (min) (ACUTE ONLY): 41 min  Charges:  $Gait Training: 23-37 mins $Therapeutic Activity: 8-22 mins                    G Codes:      Felecia Shelling  PTA [facility]  Acute  Rehab Pager      [phone]

## 2014-02-17 NOTE — Discharge Summary (Signed)
Physician Discharge Summary  Patient ID: Jennifer Estrada MRN: [id number] DOB/AGE: [birth year] 70 y.o.  Admit date: 02/12/2014 Discharge date: 02/15/2014   Procedures:  Procedure(s) (LRB): OPEN REDUCTION INTERNAL (ORIF) FIXATION LEFT PATELLA (Left)  Attending Physician:  Dr. Durene RomansMatthew Estrada   Admission Diagnoses:   Left patella fracture   Discharge Diagnoses:  Active Problems:   Patella fracture  Past Medical History  Diagnosis Date  . Allergic rhinitis   . Osteoporosis   . Depression   . Hyperlipidemia   . GERD (gastroesophageal reflux disease)   . Vitamin D deficiency   . DJD (degenerative joint disease) of knee   . Chronic kidney disease   . Tremors of nervous system   . Migraine     Takes Propranolol  . Neuromuscular disorder     Body tremors at times,h/o falling cast right arm    HPI: Pt is a 69 y.o. female had a ground level fall landing on her left knee and right wrist. She had immediate pain and inability to bear weight and went to an urgent care. Radiographs revealed left patella fracture and non-displaced right distal radius fracture. She was then told to follow up in our office. She saw Dr. Rodolph BongAdam Estrada who brought her to my attention for evaluation. Other than pain in the right wrist and left knee and other generalized soreness from the fall she was stable  PCP: Jennifer Estrada   Discharged Condition: good  Hospital Course:  Patient underwent the above stated procedure on 02/12/2014. Patient tolerated the procedure well and brought to the recovery room in good condition and subsequently to the floor.  POD #1 BP: 126/69 ; Pulse: 70 ; Temp: 97.8 F (36.6 C) ; Resp: 16 Pt c/o moderate to severe pain to left knee s/p patella fracture. Right arm in cast is doing well. Pt worried about the pain when she starts to weight bearing, patient reports pain as severe. Left knee dressing intact, nv intact distally. No rashes or edema   LABS  Basename    HGB   11.6  HCT  36.0   POD #2  BP: 111/66 ; Pulse: 82 ; Temp: 98.6 F (37 C) ; Resp: 18 Patient reports pain as moderate. She was feeling nauseated this am. Some work with therapy yesterday Dorsiflexion/plantar flexion intact, incision: dressing C/D/I, no cellulitis present and compartment soft.   POD #3  BP: 109/62 ; Pulse: 81 ; Temp: 98.9 F (37.2 C) ; Resp: 18 Patient reports pain as moderate. Doing better today after changing meds back to Norco. Ready to be discharged home.   Discharge Exam: General appearance: alert, cooperative and no distress Extremities: Homans sign is negative, no sign of DVT, no edema, redness or tenderness in the calves or thighs and no ulcers, gangrene or trophic changes  Disposition: Home with follow up in 2 weeks   Follow-up Information    Follow up with Jennifer Estrada. Schedule an appointment as soon as possible for a visit in 2 weeks.   Specialty:  Orthopedic Surgery   Contact information:   [address] [place] [address] [phone]       Follow up with [facility].   Why:  Physical Therapy   Contact information:   [address] [place] [address] [phone]       Discharge Instructions    Call Estrada / Call 911    Complete by:  As directed   If you experience  chest pain or shortness of breath, CALL 911 and be transported to the hospital emergency room.  If you develope a fever above 101 F, pus (white drainage) or increased drainage or redness at the wound, or calf pain, call your surgeon's office.     Constipation Prevention    Complete by:  As directed   Drink plenty of fluids.  Prune juice may be helpful.  You may use a stool softener, such as Colace (over the counter) 100 mg twice a day.  Use MiraLax (over the counter) for constipation as needed.     Diet - low sodium heart healthy    Complete by:  As directed      Discharge instructions    Complete by:  As directed   Maintain  surgical dressing until follow up in the clinic. If the edges start to pull up, may reinforce with tape. If the dressing is no longer working, may remove and cover with gauze and tape, but must keep the area dry and clean.  Follow up in 2 weeks at [facility]. Call with any questions or concerns.     Driving restrictions    Complete by:  As directed   No driving for 4 weeks     Increase activity slowly as tolerated    Complete by:  As directed      TED hose    Complete by:  As directed   Use stockings (TED hose) for 2 weeks on both leg(s).  You may remove them at night for sleeping.     Weight bearing as tolerated    Complete by:  As directed   With T-ROM brace or knee immobilizer in place and locking the leg in extension.  Laterality:  left  Extremity:  Lower             Medication List    STOP taking these medications        oxyCODONE-acetaminophen 5-325 MG per tablet  Commonly known as:  PERCOCET/ROXICET      TAKE these medications        CALCIUM + D PO  Take 1 tablet by mouth daily.     DEXILANT 60 MG capsule  Generic drug:  dexlansoprazole  Take 60 mg by mouth daily.     DSS 100 MG Caps  Take 100 mg by mouth 2 (two) times daily.     HYDROcodone-acetaminophen 7.5-325 MG per tablet  Commonly known as:  NORCO  Take 1-2 tablets by mouth every 4 (four) hours as needed for moderate pain.     magnesium 30 MG tablet  Take 30 mg by mouth 2 (two) times daily.     PARoxetine 20 MG tablet  Commonly known as:  PAXIL  Take 20 mg by mouth every morning.     polyethylene glycol packet  Commonly known as:  MIRALAX / GLYCOLAX  Take 17 g by mouth 2 (two) times daily.     prochlorperazine 10 MG tablet  Commonly known as:  COMPAZINE  Take 1 tablet (10 mg total) by mouth every 6 (six) hours as needed.     propranolol ER 80 MG 24 hr capsule  Commonly known as:  INDERAL LA  Take 1 capsule (80 mg total) by mouth daily.     rosuvastatin 5 MG tablet  Commonly  known as:  CRESTOR  Take 5 mg by mouth daily.     tiZANidine 4 MG tablet  Commonly known as:  ZANAFLEX  Take 1 tablet (  4 mg total) by mouth every 6 (six) hours as needed for muscle spasms.         Signed: Anastasio Auerbach. Ijeoma Loor   PA-C  02/17/2014, 8:13 AM

## 2014-08-31 ENCOUNTER — Other Ambulatory Visit: Payer: Self-pay | Admitting: Geriatric Medicine

## 2014-08-31 DIAGNOSIS — Z1231 Encounter for screening mammogram for malignant neoplasm of breast: Secondary | ICD-10-CM

## 2014-09-01 ENCOUNTER — Ambulatory Visit
Admission: RE | Admit: 2014-09-01 | Discharge: 2014-09-01 | Disposition: A | Discharge status: Home / Self Care | Payer: Medicare Other | Source: Ambulatory Visit | Attending: Geriatric Medicine | Admitting: Geriatric Medicine

## 2014-09-01 DIAGNOSIS — Z1231 Encounter for screening mammogram for malignant neoplasm of breast: Secondary | ICD-10-CM

## 2015-06-02 ENCOUNTER — Other Ambulatory Visit: Payer: Self-pay | Admitting: Gastroenterology

## 2015-09-06 ENCOUNTER — Other Ambulatory Visit: Payer: Self-pay | Admitting: Geriatric Medicine

## 2015-09-06 DIAGNOSIS — Z1231 Encounter for screening mammogram for malignant neoplasm of breast: Secondary | ICD-10-CM

## 2015-09-08 ENCOUNTER — Ambulatory Visit
Admission: RE | Admit: 2015-09-08 | Discharge: 2015-09-08 | Disposition: A | Discharge status: Home / Self Care | Payer: Medicare Other | Source: Ambulatory Visit | Attending: Geriatric Medicine | Admitting: Geriatric Medicine

## 2015-09-08 DIAGNOSIS — Z1231 Encounter for screening mammogram for malignant neoplasm of breast: Secondary | ICD-10-CM

## 2015-12-25 ENCOUNTER — Ambulatory Visit (INDEPENDENT_AMBULATORY_CARE_PROVIDER_SITE_OTHER): Payer: Medicare Other

## 2015-12-25 ENCOUNTER — Encounter (INDEPENDENT_AMBULATORY_CARE_PROVIDER_SITE_OTHER): Payer: Self-pay | Admitting: Orthopaedic Surgery

## 2015-12-25 ENCOUNTER — Ambulatory Visit (INDEPENDENT_AMBULATORY_CARE_PROVIDER_SITE_OTHER): Payer: Medicare Other | Admitting: Orthopaedic Surgery

## 2015-12-25 VITALS — BP 103/71 | HR 71 | Resp 14 | Ht 66.0 in | Wt 165.0 lb

## 2015-12-25 DIAGNOSIS — G894 Chronic pain syndrome: Secondary | ICD-10-CM | POA: Diagnosis not present

## 2015-12-25 DIAGNOSIS — G8929 Other chronic pain: Secondary | ICD-10-CM

## 2015-12-25 DIAGNOSIS — M25561 Pain in right knee: Secondary | ICD-10-CM | POA: Diagnosis not present

## 2015-12-25 NOTE — Progress Notes (Signed)
Office Visit Note   Patient: Jennifer Estrada           Date of Birth: 06-Dec-1945           MRN: [id number] Visit Date: 12/25/2015              Requested by: Merlene LaughterHal Stoneking, MD [address] [place], [place] [address] PCP: Ginette OttoSTONEKING,HAL THOMAS, MD   Assessment & Plan: Visit Diagnoses: No diagnosis found.  Plan: f/u after MRI of L-S spine  Follow-Up Instructions: No Follow-up on file.   Orders:  No orders of the defined types were placed in this encounter.  No orders of the defined types were placed in this encounter.     Procedures: No procedures performed   Clinical Data: No additional findings.   Subjective: Chief Complaint  Patient presents with  . Right Knee - Pain  . Left Knee - Pain  . Right Wrist - Pain    HPI  Review of Systems   Objective: Vital Signs: BP 103/71 (BP Location: Left Arm, Patient Position: Sitting, Cuff Size: Large)   Pulse 71   Resp 14   Ht 5\' 6"  (1.676 m)   Wt 165 lb (74.8 kg)   BMI 26.63 kg/m   Physical Exam  Ortho Exam leg raise was positive bilaterally for some posterior thigh pain. There was some mild percussible tenderness of the lumbar spine. There was no appreciable pain with range of motion of either hip with internal or external rotation. His minimal effusion of both knees with predominately medial joint pain. There was very minimal varus deformity bilaterallyTthere was crepitation with patella motion. Extension bilaterally with approximately 105 of flexion. No evidence of instability. Prior surgery right left knee with ORIF of patella fracture. She required a postoperative arthroscopic debridement for apparent adhesive capsulitis with assistant Dr. Charlann Boxerlin) she is experiencing more pain in the right than the left knee. Specialty Comments:  No specialty comments available.  Imaging: No results found.   PMFS History: Patient Active Problem List   Diagnosis Date Noted  . Patella fracture 02/12/2014  .  Migraine syndrome 05/02/2012  . Anxiety attack 05/02/2012  . TIA (transient ischemic attack) 05/02/2012   Past Medical History:  Diagnosis Date  . Allergic rhinitis   . Chronic kidney disease   . Depression   . DJD (degenerative joint disease) of knee   . GERD (gastroesophageal reflux disease)   . Hyperlipidemia   . Migraine    Takes Propranolol  . Neuromuscular disorder (HCC)    Body tremors at times,h/o falling cast right arm  . Osteoporosis   . Tremors of nervous system   . Vitamin D deficiency     No family history on file.  Past Surgical History:  Procedure Laterality Date  . Left Foot Surgery  20 years ago   20 years ago  . ORIF PATELLA Left 02/12/2014   Procedure: OPEN REDUCTION INTERNAL (ORIF) FIXATION LEFT PATELLA;  Surgeon: Shelda PalMatthew D Olin, MD;  Location: [facility];  Service: Orthopedics;  Laterality: Left;   Social History   Occupational History  . Not on file.   Social History Main Topics  . Smoking status: Never Smoker  . Smokeless tobacco: Never Used  . Alcohol use No  . Drug use: No  . Sexual activity: Not Currently   Long discussion with Mrs. [name] regarding her present pain. He is having predominantly pain in her right knee. SHe has had at least 3 prior courses of  Visco supplementation with only minimal and temporary relief of her pain. 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 She does have trace in the medial joint space. He would like to order an MRI scan of the lumbar spine to be sure that there is no component of pain referred from her lumbar spine before we consider a total knee replacement or another course of Euflexxa. I just don't think that's going to work i.e. further injections given the fact that she is not improved or had significant relief after 3 prior trials. We'll set up the MRI scan see her back shoulder thereafter  thanks

## 2015-12-25 NOTE — Progress Notes (Deleted)
*  IMAGE* Office Visit Note  Patient: Jennifer Estrada             Date of Birth: 1945-06-14           MRN: [id number]             PCP: Ginette OttoSTONEKING,HAL THOMAS, MD Visit Date: 12/25/2015   Assessment: Visit Diagnoses: No diagnosis found.   Follow-Up Instructions: No Follow-up on file.  Orders: No orders of the defined types were placed in this encounter.  No orders of the defined types were placed in this encounter.    Subjective:    Allergies: Atorvastatin; Cortisone; Oxycodone; Paroxetine; Penicillins; Pravastatin; Singulair [montelukast]; Sulfa antibiotics; Augmentin [amoxicillin-pot clavulanate]; and Prednisone   Activities of Daily Living: ***   History of Present Illness: Jennifer Estrada is a 70 y.o. female ***   No Rheumatology ROS completed.    Investigation: No additional findings.   Objective: Vital Signs: There were no vitals taken for this visit.   Physical Exam   Musculoskeletal Exam: ***  CDAI Exam: No CDAI exam completed.   Speciality Comments: No specialty comments available.  Imaging: No results found.   PMFS History:  Patient Active Problem List   Diagnosis Date Noted  . Patella fracture 02/12/2014  . Migraine syndrome 05/02/2012  . Anxiety attack 05/02/2012  . TIA (transient ischemic attack) 05/02/2012    Past Medical History:  Diagnosis Date  . Allergic rhinitis   . Chronic kidney disease   . Depression   . DJD (degenerative joint disease) of knee   . GERD (gastroesophageal reflux disease)   . Hyperlipidemia   . Migraine    Takes Propranolol  . Neuromuscular disorder    Body tremors at times,h/o falling cast right arm  . Osteoporosis   . Tremors of nervous system   . Vitamin D deficiency     No family history on file. Past Surgical History:  Procedure Laterality Date  . Left Foot Surgery  20 years ago   20 years ago  . ORIF PATELLA Left 02/12/2014   Procedure: OPEN REDUCTION INTERNAL (ORIF) FIXATION LEFT PATELLA;   Surgeon: Shelda PalMatthew D Olin, MD;  Location: [facility];  Service: Orthopedics;  Laterality: Left;   Social History   Social History Narrative  . No narrative on file     Procedures:  No procedures performed  Elmyra RicksSharon Caudle, RT

## 2015-12-25 NOTE — Progress Notes (Deleted)
   Office Visit Note   Patient: Jenell MillinerBrenda L Rothgeb           Date of Birth: 03-06-1945           MRN: [id number] Visit Date: 12/25/2015              Requested by: Merlene LaughterHal Stoneking, MD [address] [place], [place] [address] PCP: Ginette OttoSTONEKING,HAL THOMAS, MD   Assessment & Plan: Visit Diagnoses: No diagnosis found.  Plan: ***  Follow-Up Instructions: No Follow-up on file.   Orders:  No orders of the defined types were placed in this encounter.  No orders of the defined types were placed in this encounter.     Procedures: No procedures performed   Clinical Data: No additional findings.   Subjective: No chief complaint on file.   Left knee - surgery x 2,  January, 2015, Arthroscopic , Dr. Vallery Salan, Patella repair, feels like pulls, constant pain, PT - laser Right knee, no surgery, pain, swelling, difficulty walking,  Synvisc One, Hyalgan - [facility] - BIL - little relief. Dr. Corliss Skainseveshwar - Euflexxa in past x 10 years +  Fell February 08, 2015 Right wrist pain - plate, January, 2015    Review of Systems   Objective: Vital Signs: There were no vitals taken for this visit.  Physical Exam  Ortho Exam  Specialty Comments:  No specialty comments available.  Imaging: No results found.   PMFS History: Patient Active Problem List   Diagnosis Date Noted  . Patella fracture 02/12/2014  . Migraine syndrome 05/02/2012  . Anxiety attack 05/02/2012  . TIA (transient ischemic attack) 05/02/2012   Past Medical History:  Diagnosis Date  . Allergic rhinitis   . Chronic kidney disease   . Depression   . DJD (degenerative joint disease) of knee   . GERD (gastroesophageal reflux disease)   . Hyperlipidemia   . Migraine    Takes Propranolol  . Neuromuscular disorder    Body tremors at times,h/o falling cast right arm  . Osteoporosis   . Tremors of nervous system   . Vitamin D deficiency     No family history on file.  Past Surgical History:  Procedure  Laterality Date  . Left Foot Surgery  20 years ago   20 years ago  . ORIF PATELLA Left 02/12/2014   Procedure: OPEN REDUCTION INTERNAL (ORIF) FIXATION LEFT PATELLA;  Surgeon: Shelda PalMatthew D Olin, MD;  Location: [facility];  Service: Orthopedics;  Laterality: Left;   Social History   Occupational History  . Not on file.   Social History Main Topics  . Smoking status: Never Smoker  . Smokeless tobacco: Never Used  . Alcohol use No  . Drug use: No  . Sexual activity: Not Currently

## 2016-01-06 ENCOUNTER — Ambulatory Visit
Admission: RE | Admit: 2016-01-06 | Discharge: 2016-01-06 | Disposition: A | Discharge status: Home / Self Care | Payer: Medicare Other | Source: Ambulatory Visit | Attending: Orthopaedic Surgery | Admitting: Orthopaedic Surgery

## 2016-01-06 DIAGNOSIS — G894 Chronic pain syndrome: Secondary | ICD-10-CM

## 2016-01-08 ENCOUNTER — Ambulatory Visit (INDEPENDENT_AMBULATORY_CARE_PROVIDER_SITE_OTHER): Payer: Medicare Other | Admitting: Orthopaedic Surgery

## 2016-01-08 ENCOUNTER — Encounter (INDEPENDENT_AMBULATORY_CARE_PROVIDER_SITE_OTHER): Payer: Self-pay | Admitting: Orthopaedic Surgery

## 2016-01-08 VITALS — BP 120/80 | HR 70 | Ht 66.0 in | Wt 165.0 lb

## 2016-01-08 DIAGNOSIS — G8929 Other chronic pain: Secondary | ICD-10-CM | POA: Diagnosis not present

## 2016-01-08 DIAGNOSIS — M25561 Pain in right knee: Secondary | ICD-10-CM

## 2016-01-08 NOTE — Progress Notes (Signed)
Office Visit Note   Patient: Jennifer Estrada           Date of Birth: 1945-04-23           MRN: [id number] Visit Date: 01/08/2016              Requested by: Merlene LaughterHal Stoneking, MD [address] [place], [place] [address] PCP: Ginette OttoSTONEKING,HAL THOMAS, MD   Assessment & Plan: Visit Diagnoses: No diagnosis found.  Plan: MRI right knee  Follow-Up Instructions: No Follow-up on file. Jennifer Estrada on has had prior films of her right knee revealing minimal decrease in the medial joint space. She has been treated in the past for osteoarthritis with poor response to multiple modalities. She is even had a course of physical therapy. I think it is worthwhile to obtain an MRI scan of her right knee for the sake of completeness. She is having pain out of proportion to what I would expect do not feel like her pain is referred from either hip or from her back.  Orders:  No orders of the defined types were placed in this encounter.  No orders of the defined types were placed in this encounter.     Procedures: No procedures performed   Clinical Data: No additional findings.   Subjective: Chief Complaint  Patient presents with  . Lower Back - Results    Pt here today to review MRI results of Lumbar pain  She goes to chiropractor once a month. (stimulation only, no manipulation)     Jennifer Estrada is accompanied by her husband here for follow-up evaluation of her problems is having with her right lower extremity. She has had a difficult time explaining her pain she is had a difficult time getting up and getting down with a feeling of fullness in her right knee. Over years she's had Euflexxa and recently was evaluated by the [facility] clinic. She notes that it made no difference. She does have a chronic problem with her back and has been followed by her chiropractor over the years. I was concerned that there may have been referred pain from her back to her knees and and, thus,  ordered the MRI scan of the lumbar spine. This scan was performed 1125 without evidence of impingement. There were no findings to correlate with the patient's chronic back pain. She did have some mild spondylosis and degenerative disc disease.  Review of Systems   Objective: Vital Signs: There were no vitals taken for this visit.  Physical Exam  Ortho Exam examination left knee revealed multiple areas of tenderness along the medial lateral joint line. The pain was certainly out of proportion to what I would've expected. There was no evidenced of instability. There is no effusion. No calf pain or popliteal discomfort. Neurologically intact distally. She had painless range of motion of both hips. Straight leg raise was negative. Specialty Comments:  No specialty comments available.  Imaging: No results found.   PMFS History: Patient Active Problem List   Diagnosis Date Noted  . Patella fracture 02/12/2014  . Migraine syndrome 05/02/2012  . Anxiety attack 05/02/2012  . TIA (transient ischemic attack) 05/02/2012   Past Medical History:  Diagnosis Date  . Allergic rhinitis   . Chronic kidney disease   . Depression   . DJD (degenerative joint disease) of knee   . GERD (gastroesophageal reflux disease)   . Hyperlipidemia   . Migraine    Takes Propranolol  . Neuromuscular disorder (HCC)  Body tremors at times,h/o falling cast right arm  . Osteoporosis   . Tremors of nervous system   . Vitamin D deficiency     No family history on file.  Past Surgical History:  Procedure Laterality Date  . Left Foot Surgery  20 years ago   20 years ago  . ORIF PATELLA Left 02/12/2014   Procedure: OPEN REDUCTION INTERNAL (ORIF) FIXATION LEFT PATELLA;  Surgeon: Shelda PalMatthew D Olin, MD;  Location: [facility];  Service: Orthopedics;  Laterality: Left;   Social History   Occupational History  . Not on file.   Social History Main Topics  . Smoking status: Never Smoker  . Smokeless tobacco: Never Used   . Alcohol use No  . Drug use: No  . Sexual activity: Not Currently

## 2016-01-12 ENCOUNTER — Telehealth (INDEPENDENT_AMBULATORY_CARE_PROVIDER_SITE_OTHER): Payer: Self-pay | Admitting: Orthopaedic Surgery

## 2016-01-12 NOTE — Telephone Encounter (Signed)
SPoke with her and told her it was scheduled 12/9.

## 2016-01-12 NOTE — Telephone Encounter (Signed)
Patient called about MRI of her knee. [facility] and was wondering if the authorization had been sent over to them yet? Please advise.

## 2016-01-20 ENCOUNTER — Ambulatory Visit
Admission: RE | Admit: 2016-01-20 | Discharge: 2016-01-20 | Disposition: A | Discharge status: Home / Self Care | Payer: Medicare Other | Source: Ambulatory Visit | Attending: Orthopaedic Surgery | Admitting: Orthopaedic Surgery

## 2016-01-20 DIAGNOSIS — M25561 Pain in right knee: Principal | ICD-10-CM

## 2016-01-20 DIAGNOSIS — G8929 Other chronic pain: Secondary | ICD-10-CM

## 2016-01-22 ENCOUNTER — Encounter (INDEPENDENT_AMBULATORY_CARE_PROVIDER_SITE_OTHER): Payer: Self-pay | Admitting: Orthopaedic Surgery

## 2016-01-22 ENCOUNTER — Ambulatory Visit (INDEPENDENT_AMBULATORY_CARE_PROVIDER_SITE_OTHER): Payer: Medicare Other | Admitting: Orthopaedic Surgery

## 2016-01-22 VITALS — BP 120/80 | HR 70 | Resp 14 | Ht 66.0 in | Wt 165.0 lb

## 2016-01-22 DIAGNOSIS — G8929 Other chronic pain: Secondary | ICD-10-CM | POA: Diagnosis not present

## 2016-01-22 DIAGNOSIS — M25561 Pain in right knee: Secondary | ICD-10-CM

## 2016-01-22 MED ORDER — LIDOCAINE HCL 1 % IJ SOLN
5.0000 mL | INTRAMUSCULAR | Status: AC | PRN
  Administered 2016-01-22: 5 mL

## 2016-01-22 MED ORDER — METHYLPREDNISOLONE ACETATE 40 MG/ML IJ SUSP
80.0000 mg | INTRAMUSCULAR | Status: AC | PRN
  Administered 2016-01-22: 80 mg

## 2016-01-22 MED ORDER — BUPIVACAINE HCL 0.5 % IJ SOLN
3.0000 mL | INTRAMUSCULAR | Status: AC | PRN
  Administered 2016-01-22: 3 mL via INTRA_ARTICULAR

## 2016-01-22 NOTE — Progress Notes (Signed)
Office Visit Note   Patient: Jennifer Estrada           Date of Birth: 24-May-1945           MRN: [id number] Visit Date: 01/22/2016              Requested by: Merlene LaughterHal Stoneking, MD [address] [place], [place] [address] PCP: Ginette OttoSTONEKING,HAL THOMAS, MD   Assessment & Plan: Visit Diagnoses: Right knee pain consistent with tear of medial lateral menisci by MRI scan and tricompartmental degenerative joint disease  Plan: Injected right knee and follow-up 1 month  I believe that the majority of Jennifer Estrada symptoms are related to her arthritis. I would like to avoid surgery if possible as I have some concerns that she would do well postoperatively with a knee arthroscopy  Orders:  No orders of the defined types were placed in this encounter.  No orders of the defined types were placed in this encounter.     Procedures: Large Joint Inj Date/Time: 01/22/2016 2:52 PM Performed by: Valeria BatmanWHITFIELD, Eira Alpert W Authorized by: Valeria BatmanWHITFIELD, Kenya Shiraishi W   Consent Given by:  Patient Timeout: prior to procedure the correct patient, procedure, and site was verified   Indications:  Pain and joint swelling Location:  Knee Site:  R knee Prep: patient was prepped and draped in usual sterile fashion   Needle Size:  25 G Needle Length:  1.5 inches Approach:  Anteromedial Ultrasound Guidance: No   Fluoroscopic Guidance: No   Arthrogram: No   Medications:  5 mL lidocaine 1 %; 80 mg methylPREDNISolone acetate 40 MG/ML; 3 mL bupivacaine 0.5 % Aspiration Attempted: No   Patient tolerance:  Patient tolerated the procedure well with no immediate complications     Clinical Data: No additional findings.   Subjective: Chief Complaint  Patient presents with  . Right Knee - Pain, Results    Pain has not increased since last visit for her RIght knee. Right knee is sore, not really swollen  Jennifer Estrada has been followed for the problem with her right knee. Because of her persistent pain I  ordered an MRI scan and she is here for the results. Scan demonstrates a horizontal tear of the posterior horn of the medial meniscus and a possible small radial tear near the meniscal root there is also. There is also a small radial tear of the posterior horn of the lateral meniscus. There are tricompartmental degenerative changes predominantly at the patellofemoral joint and medial compartment. He has had prior treatment through [facility] with visco supplementation. She is not aware that it really made much of a difference.  Review of Systems   Objective: Vital Signs: There were no vitals taken for this visit.  Physical Exam  Ortho Exam exam of the right knee was difficult as Jennifer Estrada on head pain virtually everywhere I touch. She was having some pain along the posterior medial joint. Very small effusion. Full extension and flexion over 110 without instability  Specialty Comments:  No specialty comments available.  Imaging: No results found.   PMFS History: Patient Active Problem List   Diagnosis Date Noted  . Patella fracture 02/12/2014  . Migraine syndrome 05/02/2012  . Anxiety attack 05/02/2012  . TIA (transient ischemic attack) 05/02/2012   Past Medical History:  Diagnosis Date  . Allergic rhinitis   . Chronic kidney disease   . Depression   . DJD (degenerative joint disease) of knee   . GERD (gastroesophageal reflux disease)   .  Hyperlipidemia   . Migraine    Takes Propranolol  . Neuromuscular disorder (HCC)    Body tremors at times,h/o falling cast right arm  . Osteoporosis   . Tremors of nervous system   . Vitamin D deficiency     No family history on file.  Past Surgical History:  Procedure Laterality Date  . Left Foot Surgery  20 years ago   20 years ago  . ORIF PATELLA Left 02/12/2014   Procedure: OPEN REDUCTION INTERNAL (ORIF) FIXATION LEFT PATELLA;  Surgeon: Shelda PalMatthew D Olin, MD;  Location: [facility];  Service: Orthopedics;  Laterality: Left;   Social  History   Occupational History  . Not on file.   Social History Main Topics  . Smoking status: Never Smoker  . Smokeless tobacco: Never Used  . Alcohol use No  . Drug use: No  . Sexual activity: Not Currently

## 2016-02-23 ENCOUNTER — Ambulatory Visit (INDEPENDENT_AMBULATORY_CARE_PROVIDER_SITE_OTHER): Payer: Medicare Other | Admitting: Orthopaedic Surgery

## 2016-02-26 ENCOUNTER — Ambulatory Visit (INDEPENDENT_AMBULATORY_CARE_PROVIDER_SITE_OTHER): Payer: Medicare Other | Admitting: Orthopaedic Surgery

## 2016-02-26 ENCOUNTER — Encounter (INDEPENDENT_AMBULATORY_CARE_PROVIDER_SITE_OTHER): Payer: Self-pay | Admitting: Orthopaedic Surgery

## 2016-02-26 VITALS — BP 111/81 | HR 64 | Ht 66.0 in | Wt 165.0 lb

## 2016-02-26 DIAGNOSIS — G8929 Other chronic pain: Secondary | ICD-10-CM | POA: Diagnosis not present

## 2016-02-26 DIAGNOSIS — M25561 Pain in right knee: Secondary | ICD-10-CM | POA: Diagnosis not present

## 2016-02-26 NOTE — Progress Notes (Signed)
Office Visit Note   Patient: Jennifer Estrada           Date of Birth: 09/30/1945           MRN: [id number] Visit Date: 02/26/2016              Requested by: Merlene LaughterHal Stoneking, MD [address] [place], [place] [address] PCP: Ginette OttoSTONEKING,HAL THOMAS, MD  Chief Complaint  Patient presents with  . Right Knee - Follow-up  . Left Knee - Pain    HPI: Patient presents for 4 week follow up of right knee. She is status post injection right knee which did provide some temporary relief. She was able to make it through the holidays, and was having better with start up pain. She does state that the cortisone injections she often has headaches. She has bilateral knee pain today, left being worse than the right. She is status post ORIF left patella 02/2014. She feels tightness in left knee, she is questioning possible hardware removal. She denies any swelling.   Mrs. Jennifer Estrada has had a chronic problem with her right knee as mentioned. She's had a course of Visco supplementation with minimal relief of her pain.  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  Plan: Arthroscopic debridement right knee. Long discussion regarding possible benefit. I have some concern that her arthritis is really the basis of most of her pain but I think this  would be the next best step. She is aware of the potential limitations and would like to proceed..  Follow-Up Instructions: No Follow-up on file.   Ortho Exam Right knee exam without effusion. Mostly posterior medial joint pain. Mild crepitation beneath the patella. Some joint pain laterally. Full extension. Knee was not hot warm or red. No popliteal pain. No swelling distally. Neurovascular exam intact.  Imaging: No results found.  Orders:  No orders of the defined types were placed in this encounter.  No orders of the defined types were placed in this  encounter.    Procedures: No procedures performed  Clinical Data: No additional findings.  Subjective: Review of Systems  Objective: Vital Signs: Ht 5\' 6"  (1.676 m)   Wt 165 lb (74.8 kg)   BMI 26.63 kg/m   Specialty Comments:  No specialty comments available.  PMFS History: Patient Active Problem List   Diagnosis Date Noted  . Patella fracture 02/12/2014  . Migraine syndrome 05/02/2012  . Anxiety attack 05/02/2012  . TIA (transient ischemic attack) 05/02/2012   Past Medical History:  Diagnosis Date  . Allergic rhinitis   . Chronic kidney disease   . Depression   . DJD (degenerative joint disease) of knee   . GERD (gastroesophageal reflux disease)   . Hyperlipidemia   . Migraine    Takes Propranolol  . Neuromuscular disorder (HCC)    Body tremors at times,h/o falling cast right arm  . Osteoporosis   . Tremors of nervous system   . Vitamin D deficiency     History reviewed. No pertinent family history.  Past Surgical History:  Procedure Laterality Date  . Left Foot Surgery  20 years ago   20 years ago  . ORIF PATELLA Left 02/12/2014   Procedure: OPEN REDUCTION INTERNAL (ORIF) FIXATION LEFT PATELLA;  Surgeon: Shelda PalMatthew D Olin, MD;  Location: [facility];  Service: Orthopedics;  Laterality: Left;   Social History   Occupational History  . Not on file.  Social History Main Topics  . Smoking status: Never Smoker  . Smokeless tobacco: Never Used  . Alcohol use No  . Drug use: No  . Sexual activity: Not Currently

## 2016-02-27 ENCOUNTER — Telehealth (INDEPENDENT_AMBULATORY_CARE_PROVIDER_SITE_OTHER): Payer: Self-pay | Admitting: Orthopaedic Surgery

## 2016-02-27 NOTE — Telephone Encounter (Signed)
Spoke with pt and she will call back in March to schedule surgery

## 2016-05-16 DIAGNOSIS — S83281D Other tear of lateral meniscus, current injury, right knee, subsequent encounter: Secondary | ICD-10-CM | POA: Diagnosis not present

## 2016-05-16 DIAGNOSIS — S83241D Other tear of medial meniscus, current injury, right knee, subsequent encounter: Secondary | ICD-10-CM | POA: Diagnosis not present

## 2016-05-20 ENCOUNTER — Ambulatory Visit (INDEPENDENT_AMBULATORY_CARE_PROVIDER_SITE_OTHER): Payer: Medicare Other | Admitting: Orthopaedic Surgery

## 2016-05-20 ENCOUNTER — Encounter (INDEPENDENT_AMBULATORY_CARE_PROVIDER_SITE_OTHER): Payer: Self-pay | Admitting: Orthopaedic Surgery

## 2016-05-20 VITALS — BP 138/91 | HR 91 | Ht 66.0 in | Wt 165.0 lb

## 2016-05-20 DIAGNOSIS — G8929 Other chronic pain: Secondary | ICD-10-CM

## 2016-05-20 DIAGNOSIS — M25561 Pain in right knee: Secondary | ICD-10-CM

## 2016-05-20 NOTE — Progress Notes (Signed)
   Office Visit Note   Patient: Jennifer Estrada           Date of Birth: 1945-06-02           MRN: [id number] Visit Date: 05/20/2016              Requested by: Merlene Laughter, MD [address] [place], [place] [address] PCP: Ginette Otto, MD   Assessment & Plan: Visit Diagnoses 4 days status post right knee arthroscopy with partial medial and lateral meniscectomies. Also noted was tricompartmental degenerative arthritis with some areas of grade 4 chondromalacia. Patient doing well using a cane and not taking any pain pills:   Plan: Return to office in 2 weeks with activity as tolerated.   Follow-Up Instructions: No Follow-up on file.   Orders:  No orders of the defined types were placed in this encounter.  No orders of the defined types were placed in this encounter.     Procedures: No procedures performed   Clinical Data: No additional findings.   Subjective: No chief complaint on file.   Jennifer Estrada is a 71 year old female that is 4 days status post right knee meniscus repair. She relates she had no pain meds today.   4 days status post right knee arthroscopy. I performed a partial medial and lateral meniscectomy. I also noted significant degenerative changes at the patellofemoral joint and the medial compartment with some grade 4 changes chondroplasty was not performed. Jennifer Estrada is doing quite well without any complications  Review of Systems   Objective: Vital Signs: Ht  (1.676 m)   Wt 165 lb (74.8 kg)   BMI 26.63 kg/m   Physical Exam  Ortho Exam right knee arthroscopic portals are clean and dry. No evidence of infection. Minimal effusion. Full extension and flexion over 100 without instability. No calf pain. No distal edema. Neurovascular exam is intact.  Specialty Comments:  No specialty comments available.  Imaging: No results found.   PMFS History: Patient Active Problem List   Diagnosis Date Noted  . Patella  fracture 02/12/2014  . Migraine syndrome 05/02/2012  . Anxiety attack 05/02/2012  . TIA (transient ischemic attack) 05/02/2012   Past Medical History:  Diagnosis Date  . Allergic rhinitis   . Chronic kidney disease   . Depression   . DJD (degenerative joint disease) of knee   . GERD (gastroesophageal reflux disease)   . Hyperlipidemia   . Migraine    Takes Propranolol  . Neuromuscular disorder (HCC)    Body tremors at times,h/o falling cast right arm  . Osteoporosis   . Tremors of nervous system   . Vitamin D deficiency     No family history on file.  Past Surgical History:  Procedure Laterality Date  . Left Foot Surgery  20 years ago   20 years ago  . ORIF PATELLA Left 02/12/2014   Procedure: OPEN REDUCTION INTERNAL (ORIF) FIXATION LEFT PATELLA;  Surgeon: [name], MD;  Location: [facility];  Service: Orthopedics;  Laterality: Left;   Social History   Occupational History  . Not on file.   Social History Main Topics  . Smoking status: Never Smoker  . Smokeless tobacco: Never Used  . Alcohol use No  . Drug use: No  . Sexual activity: Not Currently

## 2016-06-03 ENCOUNTER — Encounter (INDEPENDENT_AMBULATORY_CARE_PROVIDER_SITE_OTHER): Payer: Self-pay | Admitting: Orthopaedic Surgery

## 2016-06-03 ENCOUNTER — Ambulatory Visit (INDEPENDENT_AMBULATORY_CARE_PROVIDER_SITE_OTHER): Payer: Medicare Other | Admitting: Orthopaedic Surgery

## 2016-06-03 VITALS — BP 128/85 | HR 64 | Resp 14 | Ht 66.0 in | Wt 165.0 lb

## 2016-06-03 DIAGNOSIS — M25561 Pain in right knee: Secondary | ICD-10-CM

## 2016-06-03 DIAGNOSIS — G8929 Other chronic pain: Secondary | ICD-10-CM

## 2016-06-03 NOTE — Progress Notes (Signed)
   Office Visit Note   Patient: Jennifer Estrada           Date of Birth: 07/22/1945           MRN: [id number] Visit Date: 06/03/2016              Requested by: Merlene Laughter, MD [address] [place], [place] [address] PCP: Ginette Otto, MD   Assessment & Plan: Visit Diagnoses two and one half weeks status post right knee arthroscopy. Diffuse degenerative changes as well as a tear of the medial lateral menisci with partial medial lateral meniscectomies. [name] is doing very well. No long using a cane or crutch or taking any analgesics. She is very happy with her present progress: Discussed exercises and activities. At this point I don't think she'll require physical therapy. We'll see her back in 6 weeks  Follow-Up Instructions: No Follow-up on file.   Orders:  No orders of the defined types were placed in this encounter.  No orders of the defined types were placed in this encounter.     Procedures: No procedures performed   Clinical Data: No additional findings.   Subjective: Chief Complaint  Patient presents with  . Right Knee - Pain, Follow-up  No postop problems. Denies fever or chills calf pain or distal edema. Feels that her knee is better than it was preoperatively. She is fully aware that she does have osteoarthritis and that she may have recurrent pain and achiness and/or effusion over time  HPI  Review of Systems   Objective: Vital Signs: BP 128/85   Pulse 64   Resp 14   Ht  (1.676 m)   Wt 165 lb (74.8 kg)   BMI 26.63 kg/m   Physical Exam  Ortho Exam right knee with full extension and over 110 of flexion. No instability. Puncture sites of healed  without evidence of infection. No calf pain or popliteal discomfort. No distal edema. Neurologically intact.  Specialty Comments:  No specialty comments available.  Imaging: No results found.   PMFS History: Patient Active Problem List   Diagnosis Date Noted  .  Patella fracture 02/12/2014  . Migraine syndrome 05/02/2012  . Anxiety attack 05/02/2012  . TIA (transient ischemic attack) 05/02/2012   Past Medical History:  Diagnosis Date  . Allergic rhinitis   . Chronic kidney disease   . Depression   . DJD (degenerative joint disease) of knee   . GERD (gastroesophageal reflux disease)   . Hyperlipidemia   . Migraine    Takes Propranolol  . Neuromuscular disorder (HCC)    Body tremors at times,h/o falling cast right arm  . Osteoporosis   . Tremors of nervous system   . Vitamin D deficiency     No family history on file.  Past Surgical History:  Procedure Laterality Date  . Left Foot Surgery  20 years ago   20 years ago  . ORIF PATELLA Left 02/12/2014   Procedure: OPEN REDUCTION INTERNAL (ORIF) FIXATION LEFT PATELLA;  Surgeon: [name], MD;  Location: [facility];  Service: Orthopedics;  Laterality: Left;   Social History   Occupational History  . Not on file.   Social History Main Topics  . Smoking status: Never Smoker  . Smokeless tobacco: Never Used  . Alcohol use No  . Drug use: No  . Sexual activity: Not Currently

## 2016-06-05 ENCOUNTER — Other Ambulatory Visit: Payer: Self-pay | Admitting: Obstetrics and Gynecology

## 2016-06-05 ENCOUNTER — Other Ambulatory Visit (HOSPITAL_COMMUNITY)
Admission: RE | Admit: 2016-06-05 | Discharge: 2016-06-05 | Disposition: A | Discharge status: Home / Self Care | Payer: Medicare Other | Source: Ambulatory Visit | Attending: Obstetrics and Gynecology | Admitting: Obstetrics and Gynecology

## 2016-06-05 DIAGNOSIS — Z1151 Encounter for screening for human papillomavirus (HPV): Secondary | ICD-10-CM | POA: Diagnosis present

## 2016-06-05 DIAGNOSIS — Z01419 Encounter for gynecological examination (general) (routine) without abnormal findings: Secondary | ICD-10-CM | POA: Diagnosis present

## 2016-06-07 LAB — CYTOLOGY - PAP
Diagnosis: NEGATIVE
HPV (WINDOPATH): NOT DETECTED

## 2016-07-15 ENCOUNTER — Ambulatory Visit (INDEPENDENT_AMBULATORY_CARE_PROVIDER_SITE_OTHER): Payer: Medicare Other | Admitting: Orthopaedic Surgery

## 2016-07-15 ENCOUNTER — Encounter (INDEPENDENT_AMBULATORY_CARE_PROVIDER_SITE_OTHER): Payer: Self-pay | Admitting: Orthopaedic Surgery

## 2016-07-15 VITALS — BP 115/76 | HR 66 | Ht 66.0 in | Wt 155.0 lb

## 2016-07-15 DIAGNOSIS — G8929 Other chronic pain: Secondary | ICD-10-CM

## 2016-07-15 DIAGNOSIS — M25561 Pain in right knee: Secondary | ICD-10-CM

## 2016-07-15 NOTE — Progress Notes (Signed)
Office Visit Note   Patient: Jennifer Estrada           Date of Birth: June 04, 1945           MRN: [id number] Visit Date: 07/15/2016              Requested by: Merlene LaughterStoneking, Hal, MD [address] [place], [place] [address] PCP: Merlene LaughterStoneking, Hal, MD   Assessment & Plan: Visit Diagnoses:  1. Chronic pain of right knee   Over 2 months status post right knee arthroscopy without evidence of tricompartmental degenerative changes and a tear of the medial and lateral menisci. Doing well with less pain than she had preoperative  Plan: Urged her to work on quad strengthening exercises and return as needed  Follow-Up Instructions: Return if symptoms worsen or fail to improve.   Orders:  No orders of the defined types were placed in this encounter.  No orders of the defined types were placed in this encounter.     Procedures: No procedures performed   Clinical Data: No additional findings.   Subjective: Chief Complaint  Patient presents with  . Right Knee - Edema, Routine Post Op    Jennifer Estrada is a 71 y o status post Right knee arthroscopy. She has swelling in legs and foot. SHe stayed off it all day Sunday.   Occasional swelling and stiffness but much better than she was before her surgery several months ago. She had both a partial medial lateral meniscectomy was noted to have tricompartmental degenerative changes. No fever or chills. Happy with the results  HPI  Review of Systems   Objective: Vital Signs: BP 115/76   Pulse 66   Ht 5\' 6"  (1.676 m)   Wt 155 lb (70.3 kg)   BMI 25.02 kg/m   Physical Exam  Ortho Exam right knee without effusion. No increased warmth. No particular medial lateral joint pain. Mild patellar crepitation. Full extension over 105 of flexion. No instability. No distal edema. Neurovascular exam intact. Walks without a limp. No ambulatory aid  Specialty Comments:  No specialty comments available.  Imaging: No results  found.   PMFS History: Patient Active Problem List   Diagnosis Date Noted  . Patella fracture 02/12/2014  . Migraine syndrome 05/02/2012  . Anxiety attack 05/02/2012  . TIA (transient ischemic attack) 05/02/2012   Past Medical History:  Diagnosis Date  . Allergic rhinitis   . Chronic kidney disease   . Depression   . DJD (degenerative joint disease) of knee   . GERD (gastroesophageal reflux disease)   . Hyperlipidemia   . Migraine    Takes Propranolol  . Neuromuscular disorder (HCC)    Body tremors at times,h/o falling cast right arm  . Osteoporosis   . Tremors of nervous system   . Vitamin D deficiency     History reviewed. No pertinent family history.  Past Surgical History:  Procedure Laterality Date  . Left Foot Surgery  20 years ago   20 years ago  . ORIF PATELLA Left 02/12/2014   Procedure: OPEN REDUCTION INTERNAL (ORIF) FIXATION LEFT PATELLA;  Surgeon: Shelda PalMatthew D Olin, MD;  Location: [facility];  Service: Orthopedics;  Laterality: Left;   Social History   Occupational History  . Not on file.   Social History Main Topics  . Smoking status: Never Smoker  . Smokeless tobacco: Never Used  . Alcohol use No  . Drug use: No  . Sexual activity: Not Currently  Garald Balding, MD   Note - This record has been created using [employer].  Chart creation errors have been sought, but may not always  have been located. Such creation errors do not reflect on  the standard of medical care.

## 2016-09-23 ENCOUNTER — Other Ambulatory Visit: Payer: Self-pay | Admitting: Geriatric Medicine

## 2016-09-23 DIAGNOSIS — Z1231 Encounter for screening mammogram for malignant neoplasm of breast: Secondary | ICD-10-CM

## 2016-09-24 ENCOUNTER — Ambulatory Visit (INDEPENDENT_AMBULATORY_CARE_PROVIDER_SITE_OTHER): Payer: Medicare Other | Admitting: Orthopaedic Surgery

## 2016-09-24 ENCOUNTER — Encounter (INDEPENDENT_AMBULATORY_CARE_PROVIDER_SITE_OTHER): Payer: Self-pay | Admitting: Orthopaedic Surgery

## 2016-09-24 VITALS — BP 116/71 | HR 67 | Resp 14 | Ht 66.0 in | Wt 155.0 lb

## 2016-09-24 DIAGNOSIS — G8929 Other chronic pain: Secondary | ICD-10-CM | POA: Diagnosis not present

## 2016-09-24 DIAGNOSIS — M25561 Pain in right knee: Secondary | ICD-10-CM

## 2016-09-24 DIAGNOSIS — M25562 Pain in left knee: Secondary | ICD-10-CM | POA: Diagnosis not present

## 2016-09-24 NOTE — Progress Notes (Signed)
Office Visit Note   Patient: Jenell MillinerBrenda L Kibby           Date of Birth: 1945-06-24           MRN: [id number] Visit Date: 09/24/2016              Requested by: Merlene LaughterStoneking, Hal, MD [address] [place], [place] [address] PCP: Merlene LaughterStoneking, Hal, MD   Assessment & Plan: Visit Diagnoses:  1. Chronic pain of both knees   [name] is 4 months status post arthroscopic debridement of her right knee with a tear of the medial meniscus and tricompartmental degenerative changes but generally doing quite well. Her biggest problems with her left knee. She is  several years status post open reduction internal fixation left patella fracture and has developed chondromalacia. she really feels like she is compromised because of her knee pain which  is localized predominantly anteriorly.  Plan: After long discussion she like to proceed with an arthroscopic evaluation of the left knee. This would include evaluate the medial meniscus and each of the compartments. I would consider debriding the patella. She is fully aware that this may or may not make a difference but it is her choice. She is very happy with her right knee  Follow-Up Instructions: Return will schedule surgery left knee.   Orders:  No orders of the defined types were placed in this encounter.  No orders of the defined types were placed in this encounter.     Procedures: No procedures performed   Clinical Data: No additional findings.   Subjective: Chief Complaint  Patient presents with  . Right Knee - Pain    [name] is a 71 y o that presents with Left knee pain and wants to discuss possible surgery.  Doing well with her right knee 4 months status post arthroscopic partial medial meniscectomy and debridement of all 3 compartment she is actually having more trouble with her left knee. Several years ago she sustained a patella fracture requiring open reduction internal fixation through another office. 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 She feels like it really interferes with her activities and actually wants to proceed with an arthroscopic debridement. As had some effusion occasional "catching".In  Addition she's had some chronic back pain  HPI  Review of Systems  Constitutional: Positive for fatigue. Negative for chills and fever.  Eyes: Negative for itching.  Respiratory: Negative for chest tightness and shortness of breath.   Cardiovascular: Negative for chest pain, palpitations and leg swelling.  Gastrointestinal: Negative for blood in stool, constipation and diarrhea.  Musculoskeletal: Positive for back pain and joint swelling. Negative for neck pain and neck stiffness.  Neurological: Positive for tremors and headaches. Negative for dizziness, weakness and numbness.  Hematological: Does not bruise/bleed easily.  Psychiatric/Behavioral: Negative for sleep disturbance. The patient is not nervous/anxious.      Objective: Vital Signs: BP 116/71   Pulse 67   Resp 14   Ht 5\' 6"  (1.676 m)   Wt 155 lb (70.3 kg)   BMI 25.02 kg/m   Physical Exam  Ortho Exam right knee without an effusion. No significant medial or lateral joint pain. Minimal patellar crepitation.. Full quick extension and flexion over 110  No instability. No distal edema. Neurovascular exam intact. Left knee with considerable pain and patellar crepitation flexion-extension patella compression. Minimal medial joint pain. No effusion. No instability. No swelling distally. Straight leg raise negative bilaterally. Painless range of motion  of both hips with internal and external rotation. Skin intact. Neurovascular exam intact. Specialty Comments:  No specialty comments available.  Imaging: No results found.   PMFS History: Patient Active Problem List   Diagnosis Date Noted  . Patella fracture 02/12/2014  . Migraine syndrome 05/02/2012  . Anxiety attack 05/02/2012  . TIA  (transient ischemic attack) 05/02/2012   Past Medical History:  Diagnosis Date  . Allergic rhinitis   . Chronic kidney disease   . Depression   . DJD (degenerative joint disease) of knee   . GERD (gastroesophageal reflux disease)   . Hyperlipidemia   . Migraine    Takes Propranolol  . Neuromuscular disorder (HCC)    Body tremors at times,h/o falling cast right arm  . Osteoporosis   . Tremors of nervous system   . Vitamin D deficiency     History reviewed. No pertinent family history.  Past Surgical History:  Procedure Laterality Date  . Left Foot Surgery  20 years ago   20 years ago  . ORIF PATELLA Left 02/12/2014   Procedure: OPEN REDUCTION INTERNAL (ORIF) FIXATION LEFT PATELLA;  Surgeon: [name], MD;  Location: [facility];  Service: Orthopedics;  Laterality: Left;   Social History   Occupational History  . Not on file.   Social History Main Topics  . Smoking status: Never Smoker  . Smokeless tobacco: Never Used  . Alcohol use No  . Drug use: No  . Sexual activity: Not Currently

## 2016-09-25 ENCOUNTER — Encounter (INDEPENDENT_AMBULATORY_CARE_PROVIDER_SITE_OTHER): Payer: Self-pay | Admitting: Orthopaedic Surgery

## 2016-09-26 ENCOUNTER — Other Ambulatory Visit: Payer: Self-pay | Admitting: Geriatric Medicine

## 2016-09-26 ENCOUNTER — Ambulatory Visit
Admission: RE | Admit: 2016-09-26 | Discharge: 2016-09-26 | Disposition: A | Discharge status: Home / Self Care | Payer: Medicare Other | Source: Ambulatory Visit | Attending: Geriatric Medicine | Admitting: Geriatric Medicine

## 2016-09-26 DIAGNOSIS — M545 Low back pain, unspecified: Secondary | ICD-10-CM

## 2016-10-01 ENCOUNTER — Ambulatory Visit
Admission: RE | Admit: 2016-10-01 | Discharge: 2016-10-01 | Disposition: A | Discharge status: Home / Self Care | Payer: Medicare Other | Source: Ambulatory Visit | Attending: Geriatric Medicine | Admitting: Geriatric Medicine

## 2016-10-01 DIAGNOSIS — Z1231 Encounter for screening mammogram for malignant neoplasm of breast: Secondary | ICD-10-CM

## 2016-10-10 ENCOUNTER — Encounter: Payer: Self-pay | Admitting: Orthopaedic Surgery

## 2016-10-10 DIAGNOSIS — M94262 Chondromalacia, left knee: Secondary | ICD-10-CM | POA: Diagnosis not present

## 2016-10-15 ENCOUNTER — Other Ambulatory Visit: Payer: Self-pay | Admitting: Endocrinology

## 2016-10-15 ENCOUNTER — Ambulatory Visit (INDEPENDENT_AMBULATORY_CARE_PROVIDER_SITE_OTHER): Payer: Medicare Other | Admitting: Orthopaedic Surgery

## 2016-10-15 ENCOUNTER — Encounter (INDEPENDENT_AMBULATORY_CARE_PROVIDER_SITE_OTHER): Payer: Self-pay | Admitting: Orthopaedic Surgery

## 2016-10-15 VITALS — BP 128/67 | HR 70 | Resp 16 | Ht 66.0 in | Wt 167.0 lb

## 2016-10-15 DIAGNOSIS — G8929 Other chronic pain: Secondary | ICD-10-CM

## 2016-10-15 DIAGNOSIS — M25562 Pain in left knee: Secondary | ICD-10-CM

## 2016-10-15 DIAGNOSIS — M810 Age-related osteoporosis without current pathological fracture: Secondary | ICD-10-CM

## 2016-10-15 NOTE — Progress Notes (Signed)
   Post-Op Visit Note   Patient: Jennifer Estrada           Date of Birth: July 19, 1945           MRN: [id number] Visit Date: 10/15/2016 PCP: Merlene LaughterStoneking, Hal, MD   Assessment & Plan:  Chief Complaint:  Chief Complaint  Patient presents with  . Left Knee - Routine Post Op  . Knee Pain    Left knee post op, drainage, pain, difficulty sleeping, difficulty walking, swelling   Visit Diagnoses:  1. Chronic pain of left knee   5 days status post arthroscopic debridement of left knee. Considerable chondromalacia patella and peripatellar scarring from prior open reduction internal fixation of patella fracture. Evidence of chondromalacia on the trochlear as well. I performed debridement of all of the above. Mrs. Jennifer Estrada relates that she can already tell a difference. She walks without ambulatory aid and no longer taking pain medicine. Denies fever chills shortness of breath or chest pain  Plan: Activities as tolerated. She'll do physical therapy when she returns in several weeks. Animal effusion of her knee or puncture sites are healing nicely without evidence of infection. No calf pain. Full extension and flexes over 105 with minimal discomfort. Neurovascular exam intact  Follow-Up Instructions: Return in about 3 weeks (around 11/05/2016).   Orders:  No orders of the defined types were placed in this encounter.  No orders of the defined types were placed in this encounter.   Imaging: No results found.  PMFS History: Patient Active Problem List   Diagnosis Date Noted  . Patella fracture 02/12/2014  . Migraine syndrome 05/02/2012  . Anxiety attack 05/02/2012  . TIA (transient ischemic attack) 05/02/2012   Past Medical History:  Diagnosis Date  . Allergic rhinitis   . Chronic kidney disease   . Depression   . DJD (degenerative joint disease) of knee   . GERD (gastroesophageal reflux disease)   . Hyperlipidemia   . Migraine    Takes Propranolol  . Neuromuscular disorder (HCC)      Body tremors at times,h/o falling cast right arm  . Osteoporosis   . Tremors of nervous system   . Vitamin D deficiency     History reviewed. No pertinent family history.  Past Surgical History:  Procedure Laterality Date  . KNEE SURGERY    . Left Foot Surgery  20 years ago   20 years ago  . ORIF PATELLA Left 02/12/2014   Procedure: OPEN REDUCTION INTERNAL (ORIF) FIXATION LEFT PATELLA;  Surgeon: Shelda PalMatthew D Olin, MD;  Location: [facility];  Service: Orthopedics;  Laterality: Left;  . WRIST FRACTURE SURGERY     Social History   Occupational History  . Not on file.   Social History Main Topics  . Smoking status: Never Smoker  . Smokeless tobacco: Never Used  . Alcohol use No  . Drug use: No  . Sexual activity: Not Currently

## 2016-10-16 ENCOUNTER — Inpatient Hospital Stay (INDEPENDENT_AMBULATORY_CARE_PROVIDER_SITE_OTHER): Payer: Medicare Other | Admitting: Orthopedic Surgery

## 2016-10-21 ENCOUNTER — Ambulatory Visit
Admission: RE | Admit: 2016-10-21 | Discharge: 2016-10-21 | Disposition: A | Discharge status: Home / Self Care | Payer: Medicare Other | Source: Ambulatory Visit | Attending: Endocrinology | Admitting: Endocrinology

## 2016-10-21 DIAGNOSIS — M810 Age-related osteoporosis without current pathological fracture: Secondary | ICD-10-CM

## 2016-10-28 ENCOUNTER — Encounter (INDEPENDENT_AMBULATORY_CARE_PROVIDER_SITE_OTHER): Payer: Self-pay | Admitting: Orthopaedic Surgery

## 2016-10-28 ENCOUNTER — Ambulatory Visit (INDEPENDENT_AMBULATORY_CARE_PROVIDER_SITE_OTHER): Payer: Medicare Other | Admitting: Orthopaedic Surgery

## 2016-10-28 VITALS — BP 121/58 | HR 71 | Resp 14 | Ht 66.0 in | Wt 155.0 lb

## 2016-10-28 DIAGNOSIS — M545 Low back pain, unspecified: Secondary | ICD-10-CM

## 2016-10-28 DIAGNOSIS — Z9889 Other specified postprocedural states: Secondary | ICD-10-CM

## 2016-10-28 NOTE — Progress Notes (Signed)
Office Visit Note   Patient: Jennifer Estrada           Date of Birth: 14-Apr-1945           MRN: [id number] Visit Date: 10/28/2016              Requested by: Merlene Laughter, MD [address] [place], [place] [address] PCP: Merlene Laughter, MD   Assessment & Plan: Visit Diagnoses:  1. Lumbar spine pain   2. S/P left knee arthroscopy     Plan: 2-1/2 weeks status post left knee arthroscopy. 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 Prior open reduction internal fixation of patella fracture. No evidence of nonunion. Physical therapy, NSAIDs, office 1 month  Follow-Up Instructions: Return in about 1 month (around 11/27/2016).   Orders:  Orders Placed This Encounter  Procedures  . Ambulatory referral to Physical Therapy   No orders of the defined types were placed in this encounter.     Procedures: No procedures performed   Clinical Data: No additional findings.   Subjective: Chief Complaint  Patient presents with  . Left Knee - Routine Post Op    Jennifer Estrada is a 71 y o that is 2.5 weeks status post L knee arthoscopy. No pain meds, ambulates with a cane  Considerable pathology about the patella with osteophytes and some synovitis. Surgery involved shaving of the patella and synovectomy. Actually doing fairly well without any fever or chills. He is walking with a cane feels like she is already "better". Denies any calf pain or distal edema.  HPI  Review of Systems  Constitutional: Negative for chills, fatigue and fever.  Eyes: Negative for itching.  Respiratory: Positive for shortness of breath. Negative for chest tightness.   Cardiovascular: Positive for leg swelling. Negative for chest pain and palpitations.  Gastrointestinal: Negative for blood in stool, constipation and diarrhea.  Endocrine: Negative for polyuria.  Genitourinary: Negative for dysuria.  Musculoskeletal: Positive for back pain. Negative for joint swelling,  neck pain and neck stiffness.  Allergic/Immunologic: Negative for immunocompromised state.  Neurological: Negative for dizziness and numbness.  Hematological: Does not bruise/bleed easily.  Psychiatric/Behavioral: The patient is not nervous/anxious.      Objective: Vital Signs: BP (!) 121/58   Pulse 71   Resp 14   Ht  (1.676 m)   Wt 155 lb (70.3 kg)   BMI 25.02 kg/m   Physical Exam  Ortho Exam left knee without effusion. Positive patellar crepitation. Full extension and flexed over 105. No instability. Arthroscopic portals healing without evidence of infection. No calf pain or distal edema. Neurovascular exam intact  Specialty Comments:  No specialty comments available.  Imaging: No results found.   PMFS History: Patient Active Problem List   Diagnosis Date Noted  . Patella fracture 02/12/2014  . Migraine syndrome 05/02/2012  . Anxiety attack 05/02/2012  . TIA (transient ischemic attack) 05/02/2012   Past Medical History:  Diagnosis Date  . Allergic rhinitis   . Chronic kidney disease   . Depression   . DJD (degenerative joint disease) of knee   . GERD (gastroesophageal reflux disease)   . Hyperlipidemia   . Migraine    Takes Propranolol  . Neuromuscular disorder (HCC)    Body tremors at times,h/o falling cast right arm  . Osteoporosis   . Tremors of nervous system   . Vitamin D deficiency     History reviewed. No pertinent family history.  Past Surgical History:  Procedure Laterality Date  . KNEE SURGERY    . Left Foot Surgery  20 years ago   20 years ago  . ORIF PATELLA Left 02/12/2014   Procedure: OPEN REDUCTION INTERNAL (ORIF) FIXATION LEFT PATELLA;  Surgeon: [name], MD;  Location: [facility];  Service: Orthopedics;  Laterality: Left;  . WRIST FRACTURE SURGERY     Social History   Occupational History  . Not on file.   Social History Main Topics  . Smoking status: Never Smoker  . Smokeless tobacco: Never Used  . Alcohol use No  . Drug  use: No  . Sexual activity: Not Currently

## 2016-10-30 ENCOUNTER — Ambulatory Visit: Payer: Medicare Other | Attending: Orthopaedic Surgery | Admitting: Physical Therapy

## 2016-10-30 ENCOUNTER — Encounter: Payer: Self-pay | Admitting: Physical Therapy

## 2016-10-30 DIAGNOSIS — R262 Difficulty in walking, not elsewhere classified: Secondary | ICD-10-CM

## 2016-10-30 DIAGNOSIS — G8929 Other chronic pain: Secondary | ICD-10-CM | POA: Insufficient documentation

## 2016-10-30 DIAGNOSIS — M545 Low back pain: Secondary | ICD-10-CM | POA: Insufficient documentation

## 2016-10-30 DIAGNOSIS — M6281 Muscle weakness (generalized): Secondary | ICD-10-CM

## 2016-10-30 DIAGNOSIS — M25662 Stiffness of left knee, not elsewhere classified: Secondary | ICD-10-CM | POA: Insufficient documentation

## 2016-10-30 DIAGNOSIS — M25562 Pain in left knee: Secondary | ICD-10-CM | POA: Diagnosis present

## 2016-10-30 NOTE — Therapy (Addendum)
[facility] [address] [place], [place], [address] Phone: [phone]   Fax:  [phone]  Physical Therapy Evaluation  Patient Details  Name: Jennifer Estrada MRN: [id number] Date of Birth: 1945/11/30 Referring Provider: Valeria Batman, MD  Encounter Date: 10/30/2016      PT End of Session - 10/30/16 1051    Visit Number 1   Number of Visits 17   Date for PT Re-Evaluation 12/27/16   Authorization Type [employer] MCR   PT Start Time 1050   PT Stop Time 1144   PT Time Calculation (min) 54 min   Activity Tolerance Patient tolerated treatment well   Behavior During Therapy [facility] for tasks assessed/performed      Past Medical History:  Diagnosis Date  . Allergic rhinitis   . Chronic kidney disease   . Depression   . DJD (degenerative joint disease) of knee   . GERD (gastroesophageal reflux disease)   . Hyperlipidemia   . Migraine    Takes Propranolol  . Neuromuscular disorder (HCC)    Body tremors at times,h/o falling cast right arm  . Osteoporosis   . Tremors of nervous system   . Vitamin D deficiency     Past Surgical History:  Procedure Laterality Date  . KNEE SURGERY    . Left Foot Surgery  20 years ago   20 years ago  . ORIF PATELLA Left 02/12/2014   Procedure: OPEN REDUCTION INTERNAL (ORIF) FIXATION LEFT PATELLA;  Surgeon: [name], MD;  Location: [facility];  Service: Orthopedics;  Laterality: Left;  . WRIST FRACTURE SURGERY      There were no vitals filed for this visit.       Subjective Assessment - 10/30/16 1054    Subjective Pt reports L knee is feeling pretty tight, arthroscopy 8/30. Once I get walking, it's there but tolerable. pt reports bone density scan revealed osteoporosis. Pt points to tailbone when describing LBP that has been worsening since fall in December 2015.   How long can you sit comfortably? unlimited   How long can you stand comfortably? 5-10 min   How long can you walk  comfortably? 15-20 min   Patient Stated Goals get out of a chair, increase standing time, steps to enter home   Currently in Pain? No/denies   Pain Location Knee   Pain Orientation Left   Aggravating Factors  weight bearing   Pain Relieving Factors topical pain reliever   Multiple Pain Sites Yes   Pain Score 3   Pain Location Back   Pain Orientation Lower   Pain Descriptors / Indicators Nagging   Pain Type Chronic pain   Aggravating Factors  standing   Pain Relieving Factors rest            [facility] PT Assessment - 10/30/16 0001      Assessment   Medical Diagnosis LBP, s/p L knee arthroscopy   Referring Provider Valeria Batman, MD   Onset Date/Surgical Date 10/10/16   Hand Dominance Right   Next MD Visit --  October   Prior Therapy no     Precautions   Precaution Comments osteoporosis     Restrictions   Weight Bearing Restrictions No     Balance Screen   Has the patient fallen in the past 6 months No     Home Environment   Living Environment Private residence   Living Arrangements Spouse/significant other   Additional Comments steps to enter home  Prior Function   Level of Independence Independent   Vocation Retired   Leisure walk     Cognition   Overall Cognitive Status Within Functional Limits for tasks assessed     Observation/Other Assessments   Focus on Therapeutic Outcomes (FOTO)  knee 67% limited,  Lumbar 64% limited     Sensation   Additional Comments WFL, past experience with RLE numbness     ROM / Strength   AROM / PROM / Strength AROM;Strength     Strength   Strength Assessment Site Hip;Knee   Right/Left Hip Right;Left   Right Hip Flexion 4/5   Right Hip Extension 3+/5   Right Hip ABduction 4+/5   Left Hip Flexion 4/5   Left Hip Extension 4-/5   Left Hip ABduction 4+/5   Right/Left Knee Right;Left   Right Knee Flexion 4/5  pain   Right Knee Extension 5/5   Left Knee Flexion 5/5   Left Knee Extension 5/5     Palpation    Palpation comment TTP L4/5/S1            Objective measurements completed on examination: See above findings.          [facility] Adult PT Treatment/Exercise - 10/30/16 0001      Exercises   Exercises Knee/Hip     Knee/Hip Exercises: Stretches   Passive Hamstring Stretch Limitations seated EOB     Knee/Hip Exercises: Seated   Sit to Sand 10 reps  cues for gluts     Knee/Hip Exercises: Supine   Bridges with Newman Pies Squeeze 10 reps     Knee/Hip Exercises: Prone   Other Prone Exercises prone glut sets, seated & standing                PT Education - 10/30/16 1312    Education provided Yes   Education Details anatomy of condition, POC, HEP, exercise form/rationale, shoe wear   Person(s) Educated Patient;Spouse   Methods Explanation;Demonstration;Tactile cues;Verbal cues;Handout   Comprehension Verbalized understanding;Returned demonstration;Verbal cues required;Tactile cues required;Need further instruction          PT Short Term Goals - 10/30/16 1305      PT SHORT TERM GOAL #1   Title pt will demo sit to stand without UE support from elevated seat   Baseline max use of UE at eval   Time 3   Period Weeks   Status New   Target Date 11/22/16           PT Long Term Goals - 10/30/16 1307      PT LONG TERM GOAL #1   Title Lumbar FOTO to 52% limitation, knee FOTO to  55% limitation   Baseline lumbar 64% limitation     Knee 67% limitation at eval   Time 8   Period Weeks   Status New   Target Date 12/27/16     PT LONG TERM GOAL #2   Title Pt will return to walking for long term exercise   Baseline stopped walking due to pain at eval   Time 8   Period Weeks   Status New   Target Date 12/27/16     PT LONG TERM GOAL #3   Title Pt will verbalize 50% improvement in ability to navigate steps to enter her home   Baseline uses arms to pull herself up the stairs at eval   Time 8   Period Weeks   Status New   Target Date 12/27/16     PT  LONG TERM GOAL  #4   Title pt will be able to stand comfortably for at least 20 minutes to complete daily/self care activities   Baseline 5-10 min at eval   Time 8   Period Weeks   Status New   Target Date 12/27/16       GCODE:knee 67% limited,  Lumbar 64% limited Current CL Goal CK         Plan - 10/30/16 1259    Clinical Impression Statement Pt prestents to PT with complaints of L knee pain s/p arthroscopy 3 weeks ago as well as chronic LBP. Notable weakness in bilat LE and poor activation patterns in activities such as sit to stand. Limited knee flexion that is painful in L knee. Ambulates with a cane and a slightly flexed posture. Discussed wearing shoes that secure around her heels rather than flip flops. Pt will benefit from skilled PT in order to strengthen supporting musculature around low back and knee to meet long term functional goals.    History and Personal Factors relevant to plan of care: depression, osteporosis, tremors   Clinical Presentation Evolving   Clinical Presentation due to: recent surgical intervention to knee   Clinical Decision Making Moderate   Rehab Potential Good   PT Frequency 2x / week   PT Duration 8 weeks   PT Treatment/Interventions ADLs/Self Care Home Management;Cryotherapy;Electrical Stimulation;Iontophoresis /ml Dexamethasone;Functional mobility training;Stair training;Gait training;Ultrasound;Traction;Moist Heat;Therapeutic activities;Therapeutic exercise;Balance training;Neuromuscular re-education;Patient/family education;Passive range of motion;Manual techniques;Dry needling;Taping   PT Next Visit Plan nu step, abdominal engagement, review sit to stand   PT Home Exercise Plan prone glut sets, bridge with pillow, seated HSS;    Consulted and Agree with Plan of Care Patient;Family member/caregiver   Family Member Consulted spouse      Patient will benefit from skilled therapeutic intervention in order to improve the following deficits and impairments:   Abnormal gait, Decreased range of motion, Difficulty walking, Increased muscle spasms, Decreased activity tolerance, Decreased endurance, Pain, Improper body mechanics, Impaired flexibility, Decreased strength, Postural dysfunction  Visit Diagnosis: Acute pain of left knee - Plan: PT plan of care cert/re-cert  Stiffness of left knee, not elsewhere classified - Plan: PT plan of care cert/re-cert  Chronic midline low back pain, with sciatica presence unspecified - Plan: PT plan of care cert/re-cert  Muscle weakness (generalized) - Plan: PT plan of care cert/re-cert  Difficulty in walking, not elsewhere classified - Plan: PT plan of care cert/re-cert     Problem List Patient Active Problem List   Diagnosis Date Noted  . Patella fracture 02/12/2014  . Migraine syndrome 05/02/2012  . Anxiety attack 05/02/2012  . TIA (transient ischemic attack) 05/02/2012    Hanif Radin C. Charliene Inoue PT, DPT 10/30/16 1:17 PM   [facility] [address] [place], [place], [address] Phone: [phone]   Fax:  [phone]  Name: Jennifer Estrada MRN: [id number] Date of Birth: Jul 25, 1945

## 2016-10-30 NOTE — Patient Instructions (Signed)
  Ride bike 2/day 5 min Squeeze gluts to stand from chair.

## 2016-11-06 ENCOUNTER — Encounter: Payer: Self-pay | Admitting: Physical Therapy

## 2016-11-06 ENCOUNTER — Ambulatory Visit: Payer: Medicare Other | Admitting: Physical Therapy

## 2016-11-06 DIAGNOSIS — M25562 Pain in left knee: Secondary | ICD-10-CM

## 2016-11-06 DIAGNOSIS — R262 Difficulty in walking, not elsewhere classified: Secondary | ICD-10-CM

## 2016-11-06 DIAGNOSIS — M25662 Stiffness of left knee, not elsewhere classified: Secondary | ICD-10-CM

## 2016-11-06 DIAGNOSIS — M545 Low back pain: Secondary | ICD-10-CM

## 2016-11-06 DIAGNOSIS — G8929 Other chronic pain: Secondary | ICD-10-CM

## 2016-11-06 DIAGNOSIS — M6281 Muscle weakness (generalized): Secondary | ICD-10-CM

## 2016-11-06 NOTE — Therapy (Signed)
[facility] Outpatient Rehabilitation [facility] [address] [place], [place], [address] Phone: [phone]   Fax:  [phone]  Physical Therapy Treatment  Patient Details  Name: Jennifer Estrada MRN: [id number] Date of Birth: 05-05-1945 Referring Provider: Valeria Batman, MD  Encounter Date: 11/06/2016      PT End of Session - 11/06/16 1145    Visit Number 2   Number of Visits 17   Date for PT Re-Evaluation 12/27/16   Authorization Type UHC MCR   PT Start Time 1145   PT Stop Time 1227   PT Time Calculation (min) 42 min   Activity Tolerance Patient tolerated treatment well   Behavior During Therapy [facility] for tasks assessed/performed      Past Medical History:  Diagnosis Date  . Allergic rhinitis   . Chronic kidney disease   . Depression   . DJD (degenerative joint disease) of knee   . GERD (gastroesophageal reflux disease)   . Hyperlipidemia   . Migraine    Takes Propranolol  . Neuromuscular disorder (HCC)    Body tremors at times,h/o falling cast right arm  . Osteoporosis   . Tremors of nervous system   . Vitamin D deficiency     Past Surgical History:  Procedure Laterality Date  . KNEE SURGERY    . Left Foot Surgery  20 years ago   20 years ago  . ORIF PATELLA Left 02/12/2014   Procedure: OPEN REDUCTION INTERNAL (ORIF) FIXATION LEFT PATELLA;  Surgeon: [name], MD;  Location: [facility];  Service: Orthopedics;  Laterality: Left;  . WRIST FRACTURE SURGERY      There were no vitals filed for this visit.      Subjective Assessment - 11/06/16 1145    Subjective Pt reports knee is sore today, has been doing bike. back feels a little better, is trying to be more upright getting out of a chair.    Patient Stated Goals get out of a chair, increase standing time, steps to enter home   Currently in Pain? Yes   Pain Score 3    Pain Location Knee   Pain Orientation Left   Pain Descriptors / Indicators Sore            [facility] PT  Assessment - 11/06/16 0001      ROM / Strength   AROM / PROM / Strength AROM     AROM   AROM Assessment Site Knee   Right/Left Knee Right;Left   Right Knee Extension -2   Left Knee Extension -6                     [facility] Adult PT Treatment/Exercise - 11/06/16 0001      Knee/Hip Exercises: Stretches   Passive Hamstring Stretch Both;2 reps;30 seconds   Passive Hamstring Stretch Limitations seated EOB   Gastroc Stretch 2 reps;30 seconds   Gastroc Stretch Limitations slant board   Other Knee/Hip Stretches piriformis stretch     Knee/Hip Exercises: Aerobic   Nustep L4 6 min     Knee/Hip Exercises: Seated   Sit to Sand without UE support  3. reps, pushes with arms from table     Knee/Hip Exercises: Supine   Bridges with [employer] 15 reps   Straight Leg Raises Both;15 reps     Knee/Hip Exercises: Sidelying   Clams both x30                PT Education - 11/06/16 1222  Education provided Yes   Education Details exercise form/[place]onale   Person(s) Educated Patient   Methods Explanation;Demonstration;Tactile cues;Verbal cues;Handout   Comprehension Verbalized understanding;Returned demonstration;Verbal cues required;Tactile cues required;Need further instruction          PT Short Term Goals - 10/30/16 1305      PT SHORT TERM GOAL #1   Title pt will demo sit to stand without UE support from elevated seat   Baseline max use of UE at eval   Time 3   Period Weeks   Status New   Target Date 11/22/16           PT Long Term Goals - 10/30/16 1307      PT LONG TERM GOAL #1   Title Lumbar FOTO to 52% limitation, knee FOTO to  55% limitation   Baseline lumbar 64% limitation     Knee 67% limitation at eval   Time 8   Period Weeks   Status New   Target Date 12/27/16     PT LONG TERM GOAL #2   Title Pt will return to walking for long term exercise   Baseline stopped walking due to pain at eval   Time 8   Period Weeks   Status New   Target  Date 12/27/16     PT LONG TERM GOAL #3   Title Pt will verbalize 50% improvement in ability to navigate steps to enter her home   Baseline uses arms to pull herself up the stairs at eval   Time 8   Period Weeks   Status New   Target Date 12/27/16     PT LONG TERM GOAL #4   Title pt will be able to stand comfortably for at least 20 minutes to complete daily/self care activities   Baseline 5-10 min at eval   Time 8   Period Weeks   Status New   Target Date 12/27/16               Plan - 11/06/16 1230    Clinical Impression Statement Difficulty with exercises today as well as discomfort moving between flx/ext. Significant improvement in ability to sit<>stand.    PT Treatment/Interventions ADLs/Self Care Home Management;Cryotherapy;Electrical Stimulation;Iontophoresis /ml Dexamethasone;Functional mobility training;Stair training;Gait training;Ultrasound;Traction;Moist Heat;Therapeutic activities;Therapeutic exercise;Balance training;Neuromuscular re-education;Patient/family education;Passive range of motion;Manual techniques;Dry needling;Taping   PT Next Visit Plan nu step upper & lower for core, core strengthening   PT Home Exercise Plan prone glut sets, bridge with pillow, seated HSS; SLR, clam   Consulted and Agree with Plan of Care Patient      Patient will benefit from skilled therapeutic intervention in order to improve the following deficits and impairments:  Abnormal gait, Decreased range of motion, Difficulty walking, Increased muscle spasms, Decreased activity tolerance, Decreased endurance, Pain, Improper body mechanics, Impaired flexibility, Decreased strength, Postural dysfunction  Visit Diagnosis: Acute pain of left knee  Stiffness of left knee, not elsewhere classified  Chronic midline low back pain, with sciatica presence unspecified  Muscle weakness (generalized)  Difficulty in walking, not elsewhere classified     Problem List Patient Active  Problem List   Diagnosis Date Noted  . Patella fracture 02/12/2014  . Migraine syndrome 05/02/2012  . Anxiety attack 05/02/2012  . TIA (transient ischemic attack) 05/02/2012    Kreig Parson C. Yong Wahlquist PT, DPT 11/06/16 12:33 PM   [facility] Outpatient Rehabilitation [facility] [address] [place], [place], [address] Phone: [phone]   Fax:  [phone]  Name: Jennifer Estrada  MRN: [id number] Date of Birth: Jul 01, 1945

## 2016-11-08 ENCOUNTER — Ambulatory Visit: Payer: Medicare Other | Admitting: Physical Therapy

## 2016-11-08 DIAGNOSIS — M545 Low back pain: Secondary | ICD-10-CM

## 2016-11-08 DIAGNOSIS — R262 Difficulty in walking, not elsewhere classified: Secondary | ICD-10-CM

## 2016-11-08 DIAGNOSIS — M6281 Muscle weakness (generalized): Secondary | ICD-10-CM

## 2016-11-08 DIAGNOSIS — M25562 Pain in left knee: Secondary | ICD-10-CM | POA: Diagnosis not present

## 2016-11-08 DIAGNOSIS — G8929 Other chronic pain: Secondary | ICD-10-CM

## 2016-11-08 DIAGNOSIS — M25662 Stiffness of left knee, not elsewhere classified: Secondary | ICD-10-CM

## 2016-11-08 NOTE — Therapy (Signed)
[facility] Outpatient Rehabilitation [facility] [address] [place], [place], [address] Phone: [phone]   Fax:  [phone]  Physical Therapy Treatment  Patient Details  Name: Jennifer Estrada MRN: [id number] Date of Birth: [birth year] Referring Provider: Valeria Batman, MD  Encounter Date: 11/08/2016      PT End of Session - 11/08/16 1107    Visit Number 3   Number of Visits 17   Date for PT Re-Evaluation 12/27/16   Authorization Type UHC MCR   PT Start Time 1100   PT Stop Time 1145   PT Time Calculation (min) 45 min      Past Medical History:  Diagnosis Date  . Allergic rhinitis   . Chronic kidney disease   . Depression   . DJD (degenerative joint disease) of knee   . GERD (gastroesophageal reflux disease)   . Hyperlipidemia   . Migraine    Takes Propranolol  . Neuromuscular disorder (HCC)    Body tremors at times,h/o falling cast right arm  . Osteoporosis   . Tremors of nervous system   . Vitamin D deficiency     Past Surgical History:  Procedure Laterality Date  . KNEE SURGERY    . Left Foot Surgery  20 years ago   20 years ago  . ORIF PATELLA Left 02/12/2014   Procedure: OPEN REDUCTION INTERNAL (ORIF) FIXATION LEFT PATELLA;  Surgeon: [name], MD;  Location: [facility];  Service: Orthopedics;  Laterality: Left;  . WRIST FRACTURE SURGERY      There were no vitals filed for this visit.      Subjective Assessment - 11/08/16 1106    Subjective Pt reports mild pain. Very sore yesterday.   Pain Score 3    Pain Location Knee   Pain Orientation Left   Pain Descriptors / Indicators Sore   Aggravating Factors  getting out of chairs, going down stairs.                          [facility] Adult PT Treatment/Exercise - 11/08/16 0001      Knee/Hip Exercises: Aerobic   Nustep L3 6 min     Knee/Hip Exercises: Seated   Long Arc Quad Right;Left;1 set;10 reps   Sit to [employer] without UE support  3. reps, pushes with arms  from table     Knee/Hip Exercises: Supine   Quad Sets Both;10 reps   Bridges Limitations x 10   Bridges with [employer] 10 reps   Straight Leg Raises Both;15 reps   Other Supine Knee/Hip Exercises ball squeeze x 10      Knee/Hip Exercises: Sidelying   Clams both x30                  PT Short Term Goals - 10/30/16 1305      PT SHORT TERM GOAL #1   Title pt will demo sit to stand without UE support from elevated seat   Baseline max use of UE at eval   Time 3   Period Weeks   Status New   Target Date 11/22/16           PT Long Term Goals - 10/30/16 1307      PT LONG TERM GOAL #1   Title Lumbar FOTO to 52% limitation, knee FOTO to  55% limitation   Baseline lumbar 64% limitation     Knee 67% limitation at eval   Time 8  Period Weeks   Status New   Target Date 12/27/16     PT LONG TERM GOAL #2   Title Pt will return to walking for long term exercise   Baseline stopped walking due to pain at eval   Time 8   Period Weeks   Status New   Target Date 12/27/16     PT LONG TERM GOAL #3   Title Pt will verbalize 50% improvement in ability to navigate steps to enter her home   Baseline uses arms to pull herself up the stairs at eval   Time 8   Period Weeks   Status New   Target Date 12/27/16     PT LONG TERM GOAL #4   Title pt will be able to stand comfortably for at least 20 minutes to complete daily/self care activities   Baseline 5-10 min at eval   Time 8   Period Weeks   Status New   Target Date 12/27/16               Plan - 11/08/16 1157    Clinical Impression Statement Pt reports PT very helpful especially with showing her how to get up from sitting. She is pleased with her progress. Continued gentle mat exercises with good tolerance.    PT Next Visit Plan nu step upper & lower for core, core strengthening   PT Home Exercise Plan prone glut sets, bridge with pillow, seated HSS; SLR, clam   Consulted and Agree with Plan of Care Patient       Patient will benefit from skilled therapeutic intervention in order to improve the following deficits and impairments:  Abnormal gait, Decreased range of motion, Difficulty walking, Increased muscle spasms, Decreased activity tolerance, Decreased endurance, Pain, Improper body mechanics, Impaired flexibility, Decreased strength, Postural dysfunction  Visit Diagnosis: Acute pain of left knee  Stiffness of left knee, not elsewhere classified  Chronic midline low back pain, with sciatica presence unspecified  Muscle weakness (generalized)  Difficulty in walking, not elsewhere classified     Problem List Patient Active Problem List   Diagnosis Date Noted  . Patella fracture 02/12/2014  . Migraine syndrome 05/02/2012  . Anxiety attack 05/02/2012  . TIA (transient ischemic attack) 05/02/2012    Sherrie Mustache, PTA 11/08/2016, 12:04 PM  [facility] [address] [place], [place], [address] Phone: [phone]   Fax:  [phone]  Name: Jennifer Estrada MRN: [id number] Date of Birth: March 20, 1945

## 2016-11-12 ENCOUNTER — Encounter: Payer: Self-pay | Admitting: Physical Therapy

## 2016-11-12 ENCOUNTER — Ambulatory Visit: Payer: Medicare Other | Attending: Orthopaedic Surgery | Admitting: Physical Therapy

## 2016-11-12 DIAGNOSIS — G8929 Other chronic pain: Secondary | ICD-10-CM | POA: Diagnosis present

## 2016-11-12 DIAGNOSIS — R262 Difficulty in walking, not elsewhere classified: Secondary | ICD-10-CM | POA: Diagnosis present

## 2016-11-12 DIAGNOSIS — M25562 Pain in left knee: Secondary | ICD-10-CM | POA: Diagnosis present

## 2016-11-12 DIAGNOSIS — M6281 Muscle weakness (generalized): Secondary | ICD-10-CM | POA: Insufficient documentation

## 2016-11-12 DIAGNOSIS — M25662 Stiffness of left knee, not elsewhere classified: Secondary | ICD-10-CM | POA: Insufficient documentation

## 2016-11-12 DIAGNOSIS — M545 Low back pain: Secondary | ICD-10-CM | POA: Diagnosis present

## 2016-11-12 NOTE — Therapy (Signed)
[facility] Outpatient Rehabilitation [facility] [address] [place], [place], [address] Phone: [phone]   Fax:  [phone]  Physical Therapy Treatment  Patient Details  Name: Jennifer Estrada MRN: [id number] Date of Birth: 08-Feb-1946 Referring Provider: Valeria Batman, MD  Encounter Date: 11/12/2016      PT End of Session - 11/12/16 1059    Visit Number 4   Number of Visits 17   Date for PT Re-Evaluation 12/27/16   Authorization Type UHC MCR   PT Start Time 1100   PT Stop Time 1147   PT Time Calculation (min) 47 min   Activity Tolerance Patient tolerated treatment well   Behavior During Therapy [facility] for tasks assessed/performed      Past Medical History:  Diagnosis Date  . Allergic rhinitis   . Chronic kidney disease   . Depression   . DJD (degenerative joint disease) of knee   . GERD (gastroesophageal reflux disease)   . Hyperlipidemia   . Migraine    Takes Propranolol  . Neuromuscular disorder (HCC)    Body tremors at times,h/o falling cast right arm  . Osteoporosis   . Tremors of nervous system   . Vitamin D deficiency     Past Surgical History:  Procedure Laterality Date  . KNEE SURGERY    . Left Foot Surgery  20 years ago   20 years ago  . ORIF PATELLA Left 02/12/2014   Procedure: OPEN REDUCTION INTERNAL (ORIF) FIXATION LEFT PATELLA;  Surgeon: [name], MD;  Location: [facility];  Service: Orthopedics;  Laterality: Left;  . WRIST FRACTURE SURGERY      There were no vitals filed for this visit.      Subjective Assessment - 11/12/16 1106    Subjective L knee hurts a little around superior aspect of patella, R knee is feeling the work it has been doing.    Patient Stated Goals get out of a chair, increase standing time, steps to enter home                         [facility] Adult PT Treatment/Exercise - 11/12/16 0001      Knee/Hip Exercises: Stretches   Passive Hamstring Stretch Limitations seated EOB   Gastroc  Stretch 2 reps;30 seconds   Gastroc Stretch Limitations slant board   Other Knee/Hip Stretches piriformis stretch     Knee/Hip Exercises: Aerobic   Nustep UE & LE 6 min L6     Knee/Hip Exercises: Seated   Sit to Sand without UE support  training to stand with arms fwd, sitting on airex to elevate     Knee/Hip Exercises: Supine   Bridges with Clams[employer] 20 reps   Straight Leg Raises Both;15 reps   Straight Leg Raise with External Rotation Both;15 reps   Other Supine Knee/Hip Exercises iso press into physioball for core- bilat & obliques                  PT Short Term Goals - 10/30/16 1305      PT SHORT TERM GOAL #1   Title pt will demo sit to stand without UE support from elevated seat   Baseline max use of UE at eval   Time 3   Period Weeks   Status New   Target Date 11/22/16           PT Long Term Goals - 10/30/16 1307      PT LONG TERM GOAL #  1   Title Lumbar FOTO to 52% limitation, knee FOTO to  55% limitation   Baseline lumbar 64% limitation     Knee 67% limitation at eval   Time 8   Period Weeks   Status New   Target Date 12/27/16     PT LONG TERM GOAL #2   Title Pt will return to walking for long term exercise   Baseline stopped walking due to pain at eval   Time 8   Period Weeks   Status New   Target Date 12/27/16     PT LONG TERM GOAL #3   Title Pt will verbalize 50% improvement in ability to navigate steps to enter her home   Baseline uses arms to pull herself up the stairs at eval   Time 8   Period Weeks   Status New   Target Date 12/27/16     PT LONG TERM GOAL #4   Title pt will be able to stand comfortably for at least 20 minutes to complete daily/self care activities   Baseline 5-10 min at eval   Time 8   Period Weeks   Status New   Target Date 12/27/16               Plan - 11/12/16 1309    Clinical Impression Statement Pt was able to stand from chair without assistance from upper extremities today, cuing for form.  Discussed importance of doing strengthening exercises to improve strength in this particular motion. Pt denied increased pain following treatment   PT Treatment/Interventions ADLs/Self Care Home Management;Cryotherapy;Electrical Stimulation;Iontophoresis /ml Dexamethasone;Functional mobility training;Stair training;Gait training;Ultrasound;Traction;Moist Heat;Therapeutic activities;Therapeutic exercise;Balance training;Neuromuscular re-education;Patient/family education;Passive range of motion;Manual techniques;Dry needling;Taping   PT Next Visit Plan nu step upper & lower for core, core strengthening, glut activation, CKC balance   PT Home Exercise Plan prone glut sets, bridge with pillow, seated HSS; SLR, clam   Consulted and Agree with Plan of Care Patient      Patient will benefit from skilled therapeutic intervention in order to improve the following deficits and impairments:  Abnormal gait, Decreased range of motion, Difficulty walking, Increased muscle spasms, Decreased activity tolerance, Decreased endurance, Pain, Improper body mechanics, Impaired flexibility, Decreased strength, Postural dysfunction  Visit Diagnosis: Acute pain of left knee  Stiffness of left knee, not elsewhere classified  Chronic midline low back pain, with sciatica presence unspecified  Muscle weakness (generalized)  Difficulty in walking, not elsewhere classified     Problem List Patient Active Problem List   Diagnosis Date Noted  . Patella fracture 02/12/2014  . Migraine syndrome 05/02/2012  . Anxiety attack 05/02/2012  . TIA (transient ischemic attack) 05/02/2012   Jettie Lazare C. Aiyden Lauderback PT, DPT 11/12/16 1:11 PM   [facility] Outpatient Rehabilitation [facility] [address] [place], [place], [address] Phone: [phone]   Fax:  [phone]  Name: Jennifer Estrada MRN: [id number] Date of Birth: [birth year]

## 2016-11-15 ENCOUNTER — Encounter: Payer: Self-pay | Admitting: Physical Therapy

## 2016-11-15 ENCOUNTER — Ambulatory Visit: Payer: Medicare Other | Admitting: Physical Therapy

## 2016-11-15 DIAGNOSIS — M545 Low back pain: Secondary | ICD-10-CM

## 2016-11-15 DIAGNOSIS — M25662 Stiffness of left knee, not elsewhere classified: Secondary | ICD-10-CM

## 2016-11-15 DIAGNOSIS — M6281 Muscle weakness (generalized): Secondary | ICD-10-CM

## 2016-11-15 DIAGNOSIS — M25562 Pain in left knee: Secondary | ICD-10-CM

## 2016-11-15 DIAGNOSIS — G8929 Other chronic pain: Secondary | ICD-10-CM

## 2016-11-15 DIAGNOSIS — R262 Difficulty in walking, not elsewhere classified: Secondary | ICD-10-CM

## 2016-11-15 NOTE — Therapy (Signed)
[facility] Outpatient Rehabilitation [facility] [address] [place], [place], [address] Phone: [phone]   Fax:  [phone]  Physical Therapy Treatment  Patient Details  Name: Jennifer Estrada MRN: [id number] Date of Birth: 1946-01-15 Referring Provider: Valeria Batman, MD  Encounter Date: 11/15/2016      PT End of Session - 11/15/16 1100    Visit Number 5   Number of Visits 17   Date for PT Re-Evaluation 12/27/16   Authorization Type UHC MCR   PT Start Time 1100   PT Stop Time 1142   PT Time Calculation (min) 42 min   Activity Tolerance Patient tolerated treatment well   Behavior During Therapy [facility] for tasks assessed/performed      Past Medical History:  Diagnosis Date  . Allergic rhinitis   . Chronic kidney disease   . Depression   . DJD (degenerative joint disease) of knee   . GERD (gastroesophageal reflux disease)   . Hyperlipidemia   . Migraine    Takes Propranolol  . Neuromuscular disorder (HCC)    Body tremors at times,h/o falling cast right arm  . Osteoporosis   . Tremors of nervous system   . Vitamin D deficiency     Past Surgical History:  Procedure Laterality Date  . KNEE SURGERY    . Left Foot Surgery  20 years ago   20 years ago  . ORIF PATELLA Left 02/12/2014   Procedure: OPEN REDUCTION INTERNAL (ORIF) FIXATION LEFT PATELLA;  Surgeon: [name], MD;  Location: [facility];  Service: Orthopedics;  Laterality: Left;  . WRIST FRACTURE SURGERY      There were no vitals filed for this visit.      Subjective Assessment - 11/15/16 1100    Subjective Pt is going to the fair next week and will be doing a  lot of walking. "I know they are there but they are not holding me up"     Patient Stated Goals get out of a chair, increase standing time, steps to enter home                         [facility] Adult PT Treatment/Exercise - 11/15/16 0001      Therapeutic Activites    Therapeutic Activities Other Therapeutic  Activities   Other Therapeutic Activities sit<>stand training     Knee/Hip Exercises: Stretches   Passive Hamstring Stretch Limitations seated EOB   Gastroc Stretch 2 reps;30 seconds   Gastroc Stretch Limitations slant board     Knee/Hip Exercises: Aerobic   Stationary Bike 6 min L3     Knee/Hip Exercises: Standing   Other Standing Knee Exercises wobble board A/P & lat, static & dynamic     Knee/Hip Exercises: Supine   Straight Leg Raises Both;10 reps  lift-abd-add-lower   Other Supine Knee/Hip Exercises bridges- with core dissociation, legs ext over bolster                PT Education - 11/15/16 1133    Education provided Yes   Education Details exercise form/rationale, HEP, gait endurance training, walking with grocery cart   Person(s) Educated Patient   Methods Explanation;Demonstration;Tactile cues;Verbal cues   Comprehension Verbalized understanding;Returned demonstration;Verbal cues required;Tactile cues required;Need further instruction          PT Short Term Goals - 11/15/16 1145      PT SHORT TERM GOAL #1   Title pt will demo sit to stand without UE support  from elevated seat   Baseline able from elevated and low   Status Achieved           PT Long Term Goals - 10/30/16 1307      PT LONG TERM GOAL #1   Title Lumbar FOTO to 52% limitation, knee FOTO to  55% limitation   Baseline lumbar 64% limitation     Knee 67% limitation at eval   Time 8   Period Weeks   Status New   Target Date 12/27/16     PT LONG TERM GOAL #2   Title Pt will return to walking for long term exercise   Baseline stopped walking due to pain at eval   Time 8   Period Weeks   Status New   Target Date 12/27/16     PT LONG TERM GOAL #3   Title Pt will verbalize 50% improvement in ability to navigate steps to enter her home   Baseline uses arms to pull herself up the stairs at eval   Time 8   Period Weeks   Status New   Target Date 12/27/16     PT LONG TERM GOAL #4    Title pt will be able to stand comfortably for at least 20 minutes to complete daily/self care activities   Baseline 5-10 min at eval   Time 8   Period Weeks   Status New   Target Date 12/27/16               Plan - 11/15/16 1143    Clinical Impression Statement Pt cont to require minor cues for sit<>stand and still moves slowly but is significantly improved, consistently able to stand without use of upper extremities. Practiced dissociation in bridge for appropriate core/glut contraction. C/o some soreness in knees with standing balance but not too bad. Asked her to walk upright at grocery store rather than leaning on to cart.    PT Treatment/Interventions ADLs/Self Care Home Management;Cryotherapy;Electrical Stimulation;Iontophoresis /ml Dexamethasone;Functional mobility training;Stair training;Gait training;Ultrasound;Traction;Moist Heat;Therapeutic activities;Therapeutic exercise;Balance training;Neuromuscular re-education;Patient/family education;Passive range of motion;Manual techniques;Dry needling;Taping   PT Next Visit Plan small steps, core/glut, CKC balance   PT Home Exercise Plan prone glut sets, bridge with pillow, seated HSS; SLR, clam   Consulted and Agree with Plan of Care Patient      Patient will benefit from skilled therapeutic intervention in order to improve the following deficits and impairments:  Abnormal gait, Decreased range of motion, Difficulty walking, Increased muscle spasms, Decreased activity tolerance, Decreased endurance, Pain, Improper body mechanics, Impaired flexibility, Decreased strength, Postural dysfunction  Visit Diagnosis: Acute pain of left knee  Stiffness of left knee, not elsewhere classified  Chronic midline low back pain, with sciatica presence unspecified  Muscle weakness (generalized)  Difficulty in walking, not elsewhere classified     Problem List Patient Active Problem List   Diagnosis Date Noted  . Patella fracture  02/12/2014  . Migraine syndrome 05/02/2012  . Anxiety attack 05/02/2012  . TIA (transient ischemic attack) 05/02/2012    Hannan Hutmacher C. Reid Regas PT, DPT 11/15/16 11:46 AM   [facility] Health Outpatient Rehabilitation [facility] [address] [place], [place], [address] Phone: [phone]   Fax:  [phone]  Name: Jennifer Estrada MRN: [id number] Date of Birth: [birth year]

## 2016-11-19 ENCOUNTER — Ambulatory Visit: Payer: Medicare Other | Admitting: Physical Therapy

## 2016-11-19 ENCOUNTER — Encounter: Payer: Self-pay | Admitting: Physical Therapy

## 2016-11-19 DIAGNOSIS — M545 Low back pain: Secondary | ICD-10-CM

## 2016-11-19 DIAGNOSIS — G8929 Other chronic pain: Secondary | ICD-10-CM

## 2016-11-19 DIAGNOSIS — M25562 Pain in left knee: Secondary | ICD-10-CM

## 2016-11-19 DIAGNOSIS — M25662 Stiffness of left knee, not elsewhere classified: Secondary | ICD-10-CM

## 2016-11-19 DIAGNOSIS — M6281 Muscle weakness (generalized): Secondary | ICD-10-CM

## 2016-11-19 DIAGNOSIS — R262 Difficulty in walking, not elsewhere classified: Secondary | ICD-10-CM

## 2016-11-19 NOTE — Therapy (Signed)
[facility] [address] [place], [place], [address] Phone: [phone]   Fax:  [phone]  Physical Therapy Treatment  Patient Details  Name: Jennifer Estrada MRN: [id number] Date of Birth: February 27, 1945 Referring Provider: Valeria Batman, MD  Encounter Date: 11/19/2016      PT End of Session - 11/19/16 1100    Visit Number 6   Number of Visits 17   Date for PT Re-Evaluation 12/27/16   Authorization Type UHC MCR   PT Start Time 1103   PT Stop Time 1145   PT Time Calculation (min) 42 min   Activity Tolerance Patient tolerated treatment well   Behavior During Therapy [facility] for tasks assessed/performed      Past Medical History:  Diagnosis Date  . Allergic rhinitis   . Chronic kidney disease   . Depression   . DJD (degenerative joint disease) of knee   . GERD (gastroesophageal reflux disease)   . Hyperlipidemia   . Migraine    Takes Propranolol  . Neuromuscular disorder (HCC)    Body tremors at times,h/o falling cast right arm  . Osteoporosis   . Tremors of nervous system   . Vitamin D deficiency     Past Surgical History:  Procedure Laterality Date  . KNEE SURGERY    . Left Foot Surgery  20 years ago   20 years ago  . ORIF PATELLA Left 02/12/2014   Procedure: OPEN REDUCTION INTERNAL (ORIF) FIXATION LEFT PATELLA;  Surgeon: [name], MD;  Location: [facility];  Service: Orthopedics;  Laterality: Left;  . WRIST FRACTURE SURGERY      There were no vitals filed for this visit.      Subjective Assessment - 11/19/16 1105    Subjective Began using Hemp to rub on knees for pain relief.    Patient Stated Goals get out of a chair, increase standing time, steps to enter home   Currently in Pain? Yes   Pain Score 4    Pain Location Knee   Pain Orientation Left   Pain Descriptors / Indicators Sore                         [facility] Adult PT Treatment/Exercise - 11/19/16 0001      Knee/Hip  Exercises: Stretches   Passive Hamstring Stretch Limitations supine with green strap   Gastroc Stretch 2 reps;30 seconds   Gastroc Stretch Limitations slant board     Knee/Hip Exercises: Aerobic   Nustep 7 min L5 LE only     Knee/Hip Exercises: Standing   Heel Raises 20 reps;10 reps   Forward Step Up Limitations 4" step, counter for single UE support   SLS with counter avail as needed     Knee/Hip Exercises: Supine   Bridges Limitations x 10     Knee/Hip Exercises: Sidelying   Hip ABduction 20 reps;Both                PT Education - 11/19/16 1230    Education provided Yes   Education Details be careful without being timid, exercise form/rationale, rest breaks while walking, walk breaks during car rides   Person(s) Educated Patient   Methods Explanation;Demonstration;Tactile cues;Verbal cues;Handout   Comprehension Verbalized understanding;Returned demonstration;Verbal cues required;Tactile cues required;Need further instruction          PT Short Term Goals - 11/15/16 1145      PT SHORT TERM GOAL #1   Title pt will  demo sit to stand without UE support from elevated seat   Baseline able from elevated and low   Status Achieved           PT Long Term Goals - 10/30/16 1307      PT LONG TERM GOAL #1   Title Lumbar FOTO to 52% limitation, knee FOTO to  55% limitation   Baseline lumbar 64% limitation     Knee 67% limitation at eval   Time 8   Period Weeks   Status New   Target Date 12/27/16     PT LONG TERM GOAL #2   Title Pt will return to walking for long term exercise   Baseline stopped walking due to pain at eval   Time 8   Period Weeks   Status New   Target Date 12/27/16     PT LONG TERM GOAL #3   Title Pt will verbalize 50% improvement in ability to navigate steps to enter her home   Baseline uses arms to pull herself up the stairs at eval   Time 8   Period Weeks   Status New   Target Date 12/27/16     PT LONG TERM GOAL #4   Title pt will  be able to stand comfortably for at least 20 minutes to complete daily/self care activities   Baseline 5-10 min at eval   Time 8   Period Weeks   Status New   Target Date 12/27/16               Plan - 11/19/16 1145    Clinical Impression Statement Good tolerance to steps today, worked on speed and confidence wihtout losing being careful. Advised pt to take rest breaks [place] and get out of the car to walk around since it is a longer car ride. Pt is very nervous about falling so we discussed being careful without being too timid and creating a dangerous environment.    PT Treatment/Interventions ADLs/Self Care Home Management;Cryotherapy;Electrical Stimulation;Iontophoresis /ml Dexamethasone;Functional mobility training;Stair training;Gait training;Ultrasound;Traction;Moist Heat;Therapeutic activities;Therapeutic exercise;Balance training;Neuromuscular re-education;Patient/family education;Passive range of motion;Manual techniques;Dry needling;Taping   PT Next Visit Plan try 6"step, airex balance/wobble board, hip strengthening   PT Home Exercise Plan prone glut sets, bridge with pillow, seated HSS; SLR, clam; practice steps appropriately, heel raises, SLS   Consulted and Agree with Plan of Care Patient      Patient will benefit from skilled therapeutic intervention in order to improve the following deficits and impairments:  Abnormal gait, Decreased range of motion, Difficulty walking, Increased muscle spasms, Decreased activity tolerance, Decreased endurance, Pain, Improper body mechanics, Impaired flexibility, Decreased strength, Postural dysfunction  Visit Diagnosis: Acute pain of left knee  Stiffness of left knee, not elsewhere classified  Chronic midline low back pain, with sciatica presence unspecified  Muscle weakness (generalized)  Difficulty in walking, not elsewhere classified     Problem List Patient Active Problem List   Diagnosis Date  Noted  . Patella fracture 02/12/2014  . Migraine syndrome 05/02/2012  . Anxiety attack 05/02/2012  . TIA (transient ischemic attack) 05/02/2012    [name] C. Jilliam Bellmore PT, DPT 11/19/16 12:30 PM   [facility] [address] [place], [place], [address] Phone: [phone]   Fax:  [phone]  Name: Jennifer Estrada MRN: [id number] Date of Birth: [birth year]

## 2016-11-22 ENCOUNTER — Encounter: Payer: Medicare Other | Admitting: Physical Therapy

## 2016-11-27 ENCOUNTER — Ambulatory Visit: Payer: Medicare Other | Admitting: Physical Therapy

## 2016-11-27 ENCOUNTER — Encounter: Payer: Self-pay | Admitting: Physical Therapy

## 2016-11-27 DIAGNOSIS — M545 Low back pain: Secondary | ICD-10-CM

## 2016-11-27 DIAGNOSIS — M25562 Pain in left knee: Secondary | ICD-10-CM

## 2016-11-27 DIAGNOSIS — G8929 Other chronic pain: Secondary | ICD-10-CM

## 2016-11-27 DIAGNOSIS — R262 Difficulty in walking, not elsewhere classified: Secondary | ICD-10-CM

## 2016-11-27 DIAGNOSIS — M25662 Stiffness of left knee, not elsewhere classified: Secondary | ICD-10-CM

## 2016-11-27 DIAGNOSIS — M6281 Muscle weakness (generalized): Secondary | ICD-10-CM

## 2016-11-27 NOTE — Therapy (Signed)
[facility] [address] [place], [place], [address] Phone: [phone]   Fax:  [phone]  Physical Therapy Treatment  Patient Details  Name: Jennifer Estrada MRN: [id number] Date of Birth: Jul 18, 1945 Referring Provider: Valeria BatmanWhitfield, Peter W, MD  Encounter Date: 11/27/2016      PT End of Session - 11/27/16 1105    Visit Number 7   Number of Visits 17   Date for PT Re-Evaluation 12/27/16   Authorization Type UHC MCR   PT Start Time 1105   PT Stop Time 1145   PT Time Calculation (min) 40 min   Activity Tolerance Patient tolerated treatment well   Behavior During Therapy [facility] for tasks assessed/performed      Past Medical History:  Diagnosis Date  . Allergic rhinitis   . Chronic kidney disease   . Depression   . DJD (degenerative joint disease) of knee   . GERD (gastroesophageal reflux disease)   . Hyperlipidemia   . Migraine    Takes Propranolol  . Neuromuscular disorder (HCC)    Body tremors at times,h/o falling cast right arm  . Osteoporosis   . Tremors of nervous system   . Vitamin D deficiency     Past Surgical History:  Procedure Laterality Date  . KNEE SURGERY    . Left Foot Surgery  20 years ago   20 years ago  . ORIF PATELLA Left 02/12/2014   Procedure: OPEN REDUCTION INTERNAL (ORIF) FIXATION LEFT PATELLA;  Surgeon: Shelda PalMatthew D Olin, MD;  Location: [facility];  Service: Orthopedics;  Laterality: Left;  . WRIST FRACTURE SURGERY      There were no vitals filed for this visit.      Subjective Assessment - 11/27/16 1105    Subjective [place] walker at fair and was able to walk 3 miles. Feels soreness today in both knees.    Patient Stated Goals get out of a chair, increase standing time, steps to enter home                         [facility] Adult PT Treatment/Exercise - 11/27/16 0001      Knee/Hip Exercises: Stretches   Passive Hamstring Stretch Both;30 seconds   Passive Hamstring Stretch  Limitations seated EOB     Knee/Hip Exercises: Aerobic   Stationary Bike 10 min L3     Knee/Hip Exercises: Supine   Bridges Limitations over physioball x20   Straight Leg Raises Both;15 reps   Straight Leg Raises Limitations trying not to touch table between reps   Straight Leg Raise with External Rotation Both;15 reps   Straight Leg Raise with External Rotation Limitations trying not to touch table between reps   Other Supine Knee/Hip Exercises ball squeeze bw knees with curl up     Knee/Hip Exercises: Sidelying   Hip ABduction Limitations abduction to hip flexion kick                PT Education - 11/27/16 1245    Education provided Yes   Education Details avoid using walker, confidence, exercise form/rationale, HEP, soreness as result of increased activity   Person(s) Educated Patient   Methods Explanation;Demonstration;Tactile cues;Verbal cues   Comprehension Verbalized understanding;Returned demonstration;Verbal cues required;Tactile cues required;Need further instruction          PT Short Term Goals - 11/15/16 1145      PT SHORT TERM GOAL #1   Title pt will demo sit to stand without UE  support from elevated seat   Baseline able from elevated and low   Status Achieved           PT Long Term Goals - 10/30/16 1307      PT LONG TERM GOAL #1   Title Lumbar FOTO to 52% limitation, knee FOTO to  55% limitation   Baseline lumbar 64% limitation     Knee 67% limitation at eval   Time 8   Period Weeks   Status New   Target Date 12/27/16     PT LONG TERM GOAL #2   Title Pt will return to walking for long term exercise   Baseline stopped walking due to pain at eval   Time 8   Period Weeks   Status New   Target Date 12/27/16     PT LONG TERM GOAL #3   Title Pt will verbalize 50% improvement in ability to navigate steps to enter her home   Baseline uses arms to pull herself up the stairs at eval   Time 8   Period Weeks   Status New   Target Date  12/27/16     PT LONG TERM GOAL #4   Title pt will be able to stand comfortably for at least 20 minutes to complete daily/self care activities   Baseline 5-10 min at eval   Time 8   Period Weeks   Status New   Target Date 12/27/16               Plan - 11/27/16 1128    Clinical Impression Statement Kept exercises lower level due to increased soreness in knee joints. Good form with exercises. Encouraged pt to avoid using walker to reduce forward bending that aggrivates sciatic pain and shifts weight into anterior aspects of joints.    PT Treatment/Interventions ADLs/Self Care Home Management;Cryotherapy;Electrical Stimulation;Iontophoresis 4mg /ml Dexamethasone;Functional mobility training;Stair training;Gait training;Ultrasound;Traction;Moist Heat;Therapeutic activities;Therapeutic exercise;Balance training;Neuromuscular re-education;Patient/family education;Passive range of motion;Manual techniques;Dry needling;Taping   PT Next Visit Plan try 6"step, airex balance/wobble board, hip strengthening; GCODE   PT Home Exercise Plan prone glut sets, bridge with pillow, seated HSS; SLR, clam; practice steps appropriately, heel raises, SLS   Consulted and Agree with Plan of Care Patient      Patient will benefit from skilled therapeutic intervention in order to improve the following deficits and impairments:  Abnormal gait, Decreased range of motion, Difficulty walking, Increased muscle spasms, Decreased activity tolerance, Decreased endurance, Pain, Improper body mechanics, Impaired flexibility, Decreased strength, Postural dysfunction  Visit Diagnosis: Acute pain of left knee  Stiffness of left knee, not elsewhere classified  Chronic midline low back pain, with sciatica presence unspecified  Muscle weakness (generalized)  Difficulty in walking, not elsewhere classified     Problem List Patient Active Problem List   Diagnosis Date Noted  . Patella fracture 02/12/2014  . Migraine  syndrome 05/02/2012  . Anxiety attack 05/02/2012  . TIA (transient ischemic attack) 05/02/2012   Jennifer Estrada PT, DPT 11/27/16 12:46 PM   [facility] [address] [place], [place], [address] Phone: [phone]   Fax:  [phone]  Name: Jennifer Estrada MRN: [id number] Date of Birth: 1945/08/14

## 2016-11-29 ENCOUNTER — Encounter: Payer: Self-pay | Admitting: Physical Therapy

## 2016-11-29 ENCOUNTER — Ambulatory Visit: Payer: Medicare Other | Admitting: Physical Therapy

## 2016-11-29 DIAGNOSIS — G8929 Other chronic pain: Secondary | ICD-10-CM

## 2016-11-29 DIAGNOSIS — M6281 Muscle weakness (generalized): Secondary | ICD-10-CM

## 2016-11-29 DIAGNOSIS — M25662 Stiffness of left knee, not elsewhere classified: Secondary | ICD-10-CM

## 2016-11-29 DIAGNOSIS — R262 Difficulty in walking, not elsewhere classified: Secondary | ICD-10-CM

## 2016-11-29 DIAGNOSIS — M25562 Pain in left knee: Secondary | ICD-10-CM

## 2016-11-29 DIAGNOSIS — M545 Low back pain: Secondary | ICD-10-CM

## 2016-11-29 NOTE — Therapy (Signed)
[facility] [address] [place], [place], [address] Phone: [phone]   Fax:  [phone]  Physical Therapy Treatment  Patient Details  Name: Jennifer Estrada MRN: [id number] Date of Birth: 1945-04-28 Referring Provider: Valeria BatmanWhitfield, Peter W, MD  Encounter Date: 11/29/2016      PT End of Session - 11/29/16 1105    Visit Number 8   Number of Visits 17   Date for PT Re-Evaluation 12/27/16   Authorization Type UHC MCR   PT Start Time 1100  5 min nonbillable for unattended time   PT Stop Time 1149   PT Time Calculation (min) 49 min   Activity Tolerance Patient tolerated treatment well   Behavior During Therapy [facility] for tasks assessed/performed      Past Medical History:  Diagnosis Date  . Allergic rhinitis   . Chronic kidney disease   . Depression   . DJD (degenerative joint disease) of knee   . GERD (gastroesophageal reflux disease)   . Hyperlipidemia   . Migraine    Takes Propranolol  . Neuromuscular disorder (HCC)    Body tremors at times,h/o falling cast right arm  . Osteoporosis   . Tremors of nervous system   . Vitamin D deficiency     Past Surgical History:  Procedure Laterality Date  . KNEE SURGERY    . Left Foot Surgery  20 years ago   20 years ago  . ORIF PATELLA Left 02/12/2014   Procedure: OPEN REDUCTION INTERNAL (ORIF) FIXATION LEFT PATELLA;  Surgeon: Shelda PalMatthew D Olin, MD;  Location: [facility];  Service: Orthopedics;  Laterality: Left;  . WRIST FRACTURE SURGERY      There were no vitals filed for this visit.      Subjective Assessment - 11/29/16 1106    Subjective Knees are feeling very sore today but I am ready to get on my feet.    Patient Stated Goals get out of a chair, increase standing time, steps to enter home   Currently in Pain? Yes   Pain Score 3    Pain Location Knee   Pain Orientation Left   Pain Descriptors / Indicators Sore                         [facility] Adult PT  Treatment/Exercise - 11/29/16 0001      Knee/Hip Exercises: Stretches   Passive Hamstring Stretch Both;30 seconds   Passive Hamstring Stretch Limitations seated EOB   Gastroc Stretch 2 reps;30 seconds   Gastroc Stretch Limitations slant board     Knee/Hip Exercises: Aerobic   Nustep 8 min L7 UE & LE     Knee/Hip Exercises: Machines for Strengthening   Cybex Leg Press 1 plate, horiz leg press     Knee/Hip Exercises: Standing   Other Standing Knee Exercises wobble board A/P & lat, static & dynamic   Other Standing Knee Exercises tandem balance     Knee/Hip Exercises: Seated   Sit to Sand without UE support;10 reps     Knee/Hip Exercises: Prone   Hip Extension Both;15 reps   Hip Extension Limitations with iso HS curl                  PT Short Term Goals - 11/15/16 1145      PT SHORT TERM GOAL #1   Title pt will demo sit to stand without UE support from elevated seat   Baseline able from elevated and low  Status Achieved           PT Long Term Goals - 10/30/16 1307      PT LONG TERM GOAL #1   Title Lumbar FOTO to 52% limitation, knee FOTO to  55% limitation   Baseline lumbar 64% limitation     Knee 67% limitation at eval   Time 8   Period Weeks   Status New   Target Date 12/27/16     PT LONG TERM GOAL #2   Title Pt will return to walking for long term exercise   Baseline stopped walking due to pain at eval   Time 8   Period Weeks   Status New   Target Date 12/27/16     PT LONG TERM GOAL #3   Title Pt will verbalize 50% improvement in ability to navigate steps to enter her home   Baseline uses arms to pull herself up the stairs at eval   Time 8   Period Weeks   Status New   Target Date 12/27/16     PT LONG TERM GOAL #4   Title pt will be able to stand comfortably for at least 20 minutes to complete daily/self care activities   Baseline 5-10 min at eval   Time 8   Period Weeks   Status New   Target Date 12/27/16               Plan  - 11/29/16 1152    Clinical Impression Statement Pt verbalized soreness today but it did not limit ability to complete exercises. Able to demonstrate safe sit<>stand without UE use and was asked to avoid using arms to reduce forward bend when standing.    PT Next Visit Plan re-eval & GCODE   PT Home Exercise Plan prone glut sets, bridge with pillow, seated HSS; SLR, clam; practice steps appropriately, heel raises, SLS   Consulted and Agree with Plan of Care Patient      Patient will benefit from skilled therapeutic intervention in order to improve the following deficits and impairments:  Abnormal gait, Decreased range of motion, Difficulty walking, Increased muscle spasms, Decreased activity tolerance, Decreased endurance, Pain, Improper body mechanics, Impaired flexibility, Decreased strength, Postural dysfunction  Visit Diagnosis: Acute pain of left knee  Stiffness of left knee, not elsewhere classified  Chronic midline low back pain, with sciatica presence unspecified  Muscle weakness (generalized)  Difficulty in walking, not elsewhere classified     Problem List Patient Active Problem List   Diagnosis Date Noted  . Patella fracture 02/12/2014  . Migraine syndrome 05/02/2012  . Anxiety attack 05/02/2012  . TIA (transient ischemic attack) 05/02/2012    Jennifer Liou C. Jennifer Estrada PT, DPT 11/29/16 11:54 AM   [facility] [address] [place], [place], [address] Phone: [phone]   Fax:  [phone]  Name: Jennifer Estrada MRN: [id number] Date of Birth: 06/14/1945

## 2016-12-02 ENCOUNTER — Encounter (INDEPENDENT_AMBULATORY_CARE_PROVIDER_SITE_OTHER): Payer: Self-pay | Admitting: Orthopaedic Surgery

## 2016-12-02 ENCOUNTER — Ambulatory Visit (INDEPENDENT_AMBULATORY_CARE_PROVIDER_SITE_OTHER): Payer: Medicare Other | Admitting: Orthopaedic Surgery

## 2016-12-02 VITALS — BP 142/86 | HR 68 | Resp 14 | Ht 67.0 in | Wt 160.0 lb

## 2016-12-02 DIAGNOSIS — M25562 Pain in left knee: Secondary | ICD-10-CM

## 2016-12-02 DIAGNOSIS — G8929 Other chronic pain: Secondary | ICD-10-CM

## 2016-12-02 NOTE — Progress Notes (Signed)
Office Visit Note   Patient: Jennifer Estrada           Date of Birth: 1945/06/10           MRN: [id number] Visit Date: 12/02/2016              Requested by: Merlene LaughterStoneking, Hal, MD [address] [place], [place] [address] PCP: Merlene LaughterStoneking, Hal, MD   Assessment & Plan: Visit Diagnoses:  1. Chronic pain of left knee     Plan: 7 weeks status post left knee arthroscopy. There was considerable spurring about the patellofemoral joint with scar plica and chondromalacia. This was debrided. Jennifer Estrada relates that she's much better than she was before surgery in very happy with the results. On discussion regarding arthritis and what she may or may not expect over time. For the moment she's happy. I'll like her to continue with her exercises and we'll plan to see her back as needed. We can discuss cortisone injections and visco supplementation over time  Follow-Up Instructions: Return if symptoms worsen or fail to improve.   Orders:  No orders of the defined types were placed in this encounter.  No orders of the defined types were placed in this encounter.     Procedures: No procedures performed   Clinical Data: No additional findings.   Subjective: Chief Complaint  Patient presents with  . Left Knee - Routine Post Op    Jennifer Estrada is a 71 y o S/P 8w Left knee arthroscopy on 10/10/16. She relates her PT is going well. Very little pain     HPI  Review of Systems  Constitutional: Negative for chills, fatigue and fever.  HENT: Positive for sinus pain and sinus pressure.   Eyes: Positive for pain. Negative for itching.  Respiratory: Negative for chest tightness and shortness of breath.   Cardiovascular: Negative for chest pain, palpitations and leg swelling.  Gastrointestinal: Negative for blood in stool, constipation and diarrhea.  Endocrine: Negative for polyuria.  Genitourinary: Negative for dysuria.  Musculoskeletal: Negative for back pain, joint swelling,  neck pain and neck stiffness.  Allergic/Immunologic: Negative for immunocompromised state.  Neurological: Positive for headaches. Negative for dizziness and numbness.  Hematological: Does not bruise/bleed easily.  Psychiatric/Behavioral: Positive for sleep disturbance. The patient is not nervous/anxious.      Objective: Vital Signs: BP (!) 142/86   Pulse 68   Resp 14   Ht 5\' 7"  (1.702 m)   Wt 160 lb (72.6 kg)   BMI 25.06 kg/m   Physical Exam  Ortho Exam awake alert and oriented 3. Comfortable sitting. Left knee with positive patellar crepitation but no pain. Full extension and flexion over 105. No instability. No particular medial lateral joint pain. Knee was not hot red swollen or E fused. No distal edema. Neurovascular exam intact  Specialty Comments:  No specialty comments available.  Imaging: No results found.   PMFS History: Patient Active Problem List   Diagnosis Date Noted  . Patella fracture 02/12/2014  . Migraine syndrome 05/02/2012  . Anxiety attack 05/02/2012  . TIA (transient ischemic attack) 05/02/2012   Past Medical History:  Diagnosis Date  . Allergic rhinitis   . Chronic kidney disease   . Depression   . DJD (degenerative joint disease) of knee   . GERD (gastroesophageal reflux disease)   . Hyperlipidemia   . Migraine    Takes Propranolol  . Neuromuscular disorder (HCC)    Body tremors at times,h/o falling cast right  arm  . Osteoporosis   . Tremors of nervous system   . Vitamin D deficiency     History reviewed. No pertinent family history.  Past Surgical History:  Procedure Laterality Date  . KNEE SURGERY    . Left Foot Surgery  20 years ago   20 years ago  . ORIF PATELLA Left 02/12/2014   Procedure: OPEN REDUCTION INTERNAL (ORIF) FIXATION LEFT PATELLA;  Surgeon: [name], MD;  Location: [facility];  Service: Orthopedics;  Laterality: Left;  . WRIST FRACTURE SURGERY     Social History   Occupational History  . Not on file.   Social  History Main Topics  . Smoking status: Never Smoker  . Smokeless tobacco: Never Used  . Alcohol use No  . Drug use: No  . Sexual activity: Not Currently

## 2016-12-04 ENCOUNTER — Ambulatory Visit: Payer: Medicare Other | Admitting: Physical Therapy

## 2016-12-04 ENCOUNTER — Encounter: Payer: Self-pay | Admitting: Physical Therapy

## 2016-12-04 DIAGNOSIS — M25662 Stiffness of left knee, not elsewhere classified: Secondary | ICD-10-CM

## 2016-12-04 DIAGNOSIS — M25562 Pain in left knee: Secondary | ICD-10-CM | POA: Diagnosis not present

## 2016-12-04 DIAGNOSIS — R262 Difficulty in walking, not elsewhere classified: Secondary | ICD-10-CM

## 2016-12-04 DIAGNOSIS — M545 Low back pain: Secondary | ICD-10-CM

## 2016-12-04 DIAGNOSIS — M6281 Muscle weakness (generalized): Secondary | ICD-10-CM

## 2016-12-04 DIAGNOSIS — G8929 Other chronic pain: Secondary | ICD-10-CM

## 2016-12-04 NOTE — Therapy (Signed)
[place], [place], [address] Phone: [phone]   Fax:  [phone]  Physical Therapy Treatment  Patient Details  Name: APPOLONIA ACKERT MRN: [id number] Date of Birth: 24-Aug-1945 Referring Provider: Garald Balding, MD  Encounter Date: 12/04/2016      PT End of Session - 12/04/16 1103    Visit Number 9   Number of Visits 17   Date for PT Re-Evaluation 12/27/16   Authorization Type UHC MCR   PT Start Time 1103   PT Stop Time 1129   PT Time Calculation (min) 26 min   Activity Tolerance Patient tolerated treatment well   Behavior During Therapy [facility] for tasks assessed/performed      Past Medical History:  Diagnosis Date  . Allergic rhinitis   . Chronic kidney disease   . Depression   . DJD (degenerative joint disease) of knee   . GERD (gastroesophageal reflux disease)   . Hyperlipidemia   . Migraine    Takes Propranolol  . Neuromuscular disorder ([place])    Body tremors at times,h/o falling cast right arm  . Osteoporosis   . Tremors of nervous system   . Vitamin D deficiency     Past Surgical History:  Procedure Laterality Date  . KNEE SURGERY    . Left Foot Surgery  20 years ago   20 years ago  . ORIF PATELLA Left 02/12/2014   Procedure: OPEN REDUCTION INTERNAL (ORIF) FIXATION LEFT PATELLA;  Surgeon: Mauri Pole, MD;  Location: [facility];  Service: Orthopedics;  Laterality: Left;  . WRIST FRACTURE SURGERY      There were no vitals filed for this visit.          [facility] PT Assessment - 12/04/16 0001      Observation/Other Assessments   Focus on Therapeutic Outcomes (FOTO)  Knee 47% limited, Lumbar 29% limited     Strength   Right Hip Flexion 5/5   Right Hip Extension 4+/5   Right Hip ABduction 5/5   Left Hip Flexion 5/5   Left Hip Extension 5/5   Left Hip ABduction 5/5   Right Knee Flexion 5/5   Right Knee Extension 5/5   Left Knee Flexion 5/5   Left Knee Extension 5/5                     [facility] Adult PT Treatment/Exercise - 12/04/16 0001      Knee/Hip Exercises: Aerobic   Nustep 10 min L5                  PT Short Term Goals - 11/15/16 1145      PT SHORT TERM GOAL #1   Title pt will demo sit to stand without UE support from elevated seat   Baseline able from elevated and low   Status Achieved           PT Long Term Goals - 12/04/16 1120      PT LONG TERM GOAL #1   Title Lumbar FOTO to 52% limitation, knee FOTO to  55% limitation   Baseline see flowsheet   Status Achieved     PT LONG TERM GOAL #2   Title Pt will return to walking for long term exercise   Baseline limited right now due to cold, using bike rather than walking   Status Partially Met     PT LONG TERM GOAL #3   Title Pt will verbalize 50% improvement in  ability to navigate steps to enter her home   Baseline 15% improved, is fearful   Status On-going     PT LONG TERM GOAL #4   Title pt will be able to stand comfortably for at least 20 minutes to complete daily/self care activities   Baseline 30 min   Status Achieved               Plan - 12/04/16 1129    Clinical Impression Statement Performed re-evaluation today and then ended treatment due to pt not feeling well. I advised her to visit MD due to length of cold symptoms. Pt will complete 3 more visits and then be d/c to indepednent program. She has made significant improvements toward goals.    PT Treatment/Interventions ADLs/Self Care Home Management;Cryotherapy;Electrical Stimulation;Iontophoresis 11m/ml Dexamethasone;Functional mobility training;Stair training;Gait training;Ultrasound;Traction;Moist Heat;Therapeutic activities;Therapeutic exercise;Balance training;Neuromuscular re-education;Patient/family education;Passive range of motion;Manual techniques;Dry needling;Taping   PT Next Visit Plan stairs   PT Home Exercise Plan prone glut sets, bridge with pillow, seated HSS; SLR, clam;  practice steps appropriately, heel raises, SLS   Consulted and Agree with Plan of Care Patient      Patient will benefit from skilled therapeutic intervention in order to improve the following deficits and impairments:  Abnormal gait, Decreased range of motion, Difficulty walking, Increased muscle spasms, Decreased activity tolerance, Decreased endurance, Pain, Improper body mechanics, Impaired flexibility, Decreased strength, Postural dysfunction  Visit Diagnosis: Acute pain of left knee  Stiffness of left knee, not elsewhere classified  Chronic midline low back pain, with sciatica presence unspecified  Muscle weakness (generalized)  Difficulty in walking, not elsewhere classified     Problem List Patient Active Problem List   Diagnosis Date Noted  . Patella fracture 02/12/2014  . Migraine syndrome 05/02/2012  . Anxiety attack 05/02/2012  . TIA (transient ischemic attack) 05/02/2012    Deaunna Olarte C. Diedre Maclellan PT, DPT 12/04/16 11:31 AM   CA[facility]GSedgwick [place] [address]Phone: [phone]  Fax:  [phone] Name: BSAKOYA WINMRN: [id number] of Birth: [birth year]

## 2016-12-06 ENCOUNTER — Ambulatory Visit: Payer: Medicare Other | Admitting: Physical Therapy

## 2016-12-06 ENCOUNTER — Encounter: Payer: Self-pay | Admitting: Physical Therapy

## 2016-12-06 DIAGNOSIS — M25662 Stiffness of left knee, not elsewhere classified: Secondary | ICD-10-CM

## 2016-12-06 DIAGNOSIS — M25562 Pain in left knee: Secondary | ICD-10-CM

## 2016-12-06 NOTE — Therapy (Signed)
[place], [place], [address] Phone: [phone]   Fax:  [phone]  Physical Therapy Treatment  Patient Details  Name: Jennifer Estrada MRN: [id number] Date of Birth: September 06, 1945 Referring Provider: Garald Balding, MD  Encounter Date: 12/06/2016      PT End of Session - 12/06/16 1100    Visit Number 10   Number of Visits 17   Date for PT Re-Evaluation 12/27/16   Authorization Type UHC MCR   PT Start Time 1100   PT Stop Time 1142   PT Time Calculation (min) 42 min   Activity Tolerance Patient tolerated treatment well   Behavior During Therapy [facility] for tasks assessed/performed      Past Medical History:  Diagnosis Date  . Allergic rhinitis   . Chronic kidney disease   . Depression   . DJD (degenerative joint disease) of knee   . GERD (gastroesophageal reflux disease)   . Hyperlipidemia   . Migraine    Takes Propranolol  . Neuromuscular disorder ([place])    Body tremors at times,h/o falling cast right arm  . Osteoporosis   . Tremors of nervous system   . Vitamin D deficiency     Past Surgical History:  Procedure Laterality Date  . KNEE SURGERY    . Left Foot Surgery  20 years ago   20 years ago  . ORIF PATELLA Left 02/12/2014   Procedure: OPEN REDUCTION INTERNAL (ORIF) FIXATION LEFT PATELLA;  Surgeon: Mauri Pole, MD;  [facility];  Service: Orthopedics;  Laterality: Left;  . WRIST FRACTURE SURGERY      There were no vitals filed for this visit.      Subjective Assessment - 12/06/16 1100    Subjective still not feeling well, has taken an entire box of mucinex. feeling sore today in both knees, weather is poor.    Patient Stated Goals get out of a chair, increase standing time, steps to enter home                         [facility] Adult PT Treatment/Exercise - 12/06/16 0001      Knee/Hip Exercises: Stretches   Passive Hamstring Stretch Both;30 seconds    Passive Hamstring Stretch Limitations seated EOB     Knee/Hip Exercises: Aerobic   Stationary Bike 5 min L4     Knee/Hip Exercises: Seated   Sit to Sand without UE support;10 reps  x10 at end with red tband at knees     Knee/Hip Exercises: Supine   Bridges with Clamshell 20 reps   Straight Leg Raises Both;15 reps   Straight Leg Raises Limitations trying not to touch table between reps   Straight Leg Raise with External Rotation Both;15 reps   Straight Leg Raise with External Rotation Limitations trying not to touch table between reps   Other Supine Knee/Hip Exercises hooklying clam red tband- bilat & single leg     Knee/Hip Exercises: Sidelying   Clams both x30 red tband at knees                  PT Short Term Goals - 11/15/16 1145      PT SHORT TERM GOAL #1   Title pt will demo sit to stand without UE support from elevated seat   Baseline able from elevated and low   Status Achieved           PT Long Term Goals -  12/04/16 1120      PT LONG TERM GOAL #1   Title Lumbar FOTO to 52% limitation, knee FOTO to  55% limitation   Baseline see flowsheet   Status Achieved     PT LONG TERM GOAL #2   Title Pt will return to walking for long term exercise   Baseline limited right now due to cold, using bike rather than walking   Status Partially Met     PT LONG TERM GOAL #3   Title Pt will verbalize 50% improvement in ability to navigate steps to enter her home   Baseline 15% improved, is fearful   Status On-going     PT LONG TERM GOAL #4   Title pt will be able to stand comfortably for at least 20 minutes to complete daily/self care activities   Baseline 30 min   Status Achieved               Plan - 12/06/16 1143    Clinical Impression Statement Pt was not feeling well due to head/chest congestion today. I advised her to call her MD due to length of these symptoms. Was able to complete exercises and demo smooth sit<>stand without UE, tried adding a  weight in her hands but was unsuccessful. Discussed positive thinking in activities- she can do it, it is just difficult.    PT Treatment/Interventions ADLs/Self Care Home Management;Cryotherapy;Electrical Stimulation;Iontophoresis 61m/ml Dexamethasone;Functional mobility training;Stair training;Gait training;Ultrasound;Traction;Moist Heat;Therapeutic activities;Therapeutic exercise;Balance training;Neuromuscular re-education;Patient/family education;Passive range of motion;Manual techniques;Dry needling;Taping   PT Next Visit Plan stairs, sit<>stand with something in hand   PT Home Exercise Plan prone glut sets, bridge with pillow, seated HSS; SLR, clam; practice steps appropriately, heel raises, SLS   Consulted and Agree with Plan of Care Patient      Patient will benefit from skilled therapeutic intervention in order to improve the following deficits and impairments:  Abnormal gait, Decreased range of motion, Difficulty walking, Increased muscle spasms, Decreased activity tolerance, Decreased endurance, Pain, Improper body mechanics, Impaired flexibility, Decreased strength, Postural dysfunction  Visit Diagnosis: Acute pain of left knee  Stiffness of left knee, not elsewhere classified     Problem List Patient Active Problem List   Diagnosis Date Noted  . Patella fracture 02/12/2014  . Migraine syndrome 05/02/2012  . Anxiety attack 05/02/2012  . TIA (transient ischemic attack) 05/02/2012    Tami Blass C. Eugina Row PT, DPT 12/06/16 11:45 AM   CWardCM[facility] Center[address]GStapleton [address] [address]Phone: [phone]  Fax:  [phone] Name: BTAISHA PENNEBAKERMRN: [id number] of Birth: 107-20-1947

## 2016-12-11 ENCOUNTER — Encounter: Payer: Self-pay | Admitting: Physical Therapy

## 2016-12-11 ENCOUNTER — Ambulatory Visit: Payer: Medicare Other | Admitting: Physical Therapy

## 2016-12-11 DIAGNOSIS — M25562 Pain in left knee: Secondary | ICD-10-CM

## 2016-12-11 DIAGNOSIS — M25662 Stiffness of left knee, not elsewhere classified: Secondary | ICD-10-CM

## 2016-12-11 NOTE — Therapy (Signed)
[place], [place], [address] Phone: [phone]   Fax:  [phone]  Physical Therapy Treatment  Patient Details  Name: Jennifer Estrada MRN: [id number] Date of Birth: 1945/04/20 Referring Provider: Garald Balding, MD  Encounter Date: 12/11/2016      PT End of Session - 12/11/16 1142    Visit Number 11   Number of Visits 17   Date for PT Re-Evaluation 12/27/16   Authorization Type UHC MCR   PT Start Time 7564   PT Stop Time 1215   PT Time Calculation (min) 30 min   Activity Tolerance Treatment limited secondary to medical complications (Comment)   Behavior During Therapy [facility] for tasks assessed/performed      Past Medical History:  Diagnosis Date  . Allergic rhinitis   . Chronic kidney disease   . Depression   . DJD (degenerative joint disease) of knee   . GERD (gastroesophageal reflux disease)   . Hyperlipidemia   . Migraine    Takes Propranolol  . Neuromuscular disorder ([place])    Body tremors at times,h/o falling cast right arm  . Osteoporosis   . Tremors of nervous system   . Vitamin D deficiency     Past Surgical History:  Procedure Laterality Date  . KNEE SURGERY    . Left Foot Surgery  20 years ago   20 years ago  . ORIF PATELLA Left 02/12/2014   Procedure: OPEN REDUCTION INTERNAL (ORIF) FIXATION LEFT PATELLA;  Surgeon: Mauri Pole, MD;  [facility];  Service: Orthopedics;  Laterality: Left;  . WRIST FRACTURE SURGERY      There were no vitals filed for this visit.      Subjective Assessment - 12/11/16 1145    Subjective Feels very tired and is still ill with cold symptoms.    Patient Stated Goals get out of a chair, increase standing time, steps to enter home                         [facility] Adult PT Treatment/Exercise - 12/11/16 0001      Knee/Hip Exercises: Stretches   Gastroc Stretch 2 reps;30 seconds   Gastroc Stretch Limitations slant board      Knee/Hip Exercises: Aerobic   Nustep 6 min L5     Knee/Hip Exercises: Machines for Strengthening   Cybex Leg Press 1 plate, horiz leg press     Knee/Hip Exercises: Standing   Functional Squat Limitations hang from arm of freemotion squat & stand     Knee/Hip Exercises: Seated   Sit to [employer] without UE support                  PT Short Term Goals - 11/15/16 1145      PT SHORT TERM GOAL #1   Title pt will demo sit to stand without UE support from elevated seat   Baseline able from elevated and low   Status Achieved           PT Long Term Goals - 12/04/16 1120      PT LONG TERM GOAL #1   Title Lumbar FOTO to 52% limitation, knee FOTO to  55% limitation   Baseline see flowsheet   Status Achieved     PT LONG TERM GOAL #2   Title Pt will return to walking for long term exercise   Baseline limited right now due to cold, using bike rather than  walking   Status Partially Met     PT LONG TERM GOAL #3   Title Pt will verbalize 50% improvement in ability to navigate steps to enter her home   Baseline 15% improved, is fearful   Status On-going     PT LONG TERM GOAL #4   Title pt will be able to stand comfortably for at least 20 minutes to complete daily/self care activities   Baseline 30 min   Status Achieved               Plan - 12/11/16 1218    Clinical Impression Statement Ended treatment early today due to worsening of cold symptoms. Pt had a poor experience with her last visit seeing an MD she is not familiar with and I asked her to contact her PCP. I encouraged the pt to go home and rest.    PT Treatment/Interventions ADLs/Self Care Home Management;Cryotherapy;Electrical Stimulation;Iontophoresis 36m/ml Dexamethasone;Functional mobility training;Stair training;Gait training;Ultrasound;Traction;Moist Heat;Therapeutic activities;Therapeutic exercise;Balance training;Neuromuscular re-education;Patient/family education;Passive range of motion;Manual  techniques;Dry needling;Taping   PT Next Visit Plan d/c   PT Home Exercise Plan prone glut sets, bridge with pillow, seated HSS; SLR, clam; practice steps appropriately, heel raises, SLS   Consulted and Agree with Plan of Care Patient      Patient will benefit from skilled therapeutic intervention in order to improve the following deficits and impairments:  Abnormal gait, Decreased range of motion, Difficulty walking, Increased muscle spasms, Decreased activity tolerance, Decreased endurance, Pain, Improper body mechanics, Impaired flexibility, Decreased strength, Postural dysfunction  Visit Diagnosis: Acute pain of left knee  Stiffness of left knee, not elsewhere classified     Problem List Patient Active Problem List   Diagnosis Date Noted  . Patella fracture 02/12/2014  . Migraine syndrome 05/02/2012  . Anxiety attack 05/02/2012  . TIA (transient ischemic attack) 05/02/2012   Jennifer Estrada C. Adel Burch PT, DPT 12/11/16 12:20 PM   CC[facility]neeC[facility] [place] [address]Phone: [phone]  Fax:  [phone] Name: Jennifer KAYLORMRN: [id number] of Birth: [birth year]

## 2016-12-13 ENCOUNTER — Ambulatory Visit: Payer: Medicare Other | Admitting: Physical Therapy

## 2016-12-16 ENCOUNTER — Encounter: Payer: Self-pay | Admitting: Physical Therapy

## 2016-12-16 ENCOUNTER — Ambulatory Visit: Payer: Medicare Other | Attending: Orthopaedic Surgery | Admitting: Physical Therapy

## 2016-12-16 DIAGNOSIS — M25662 Stiffness of left knee, not elsewhere classified: Secondary | ICD-10-CM | POA: Diagnosis present

## 2016-12-16 DIAGNOSIS — R262 Difficulty in walking, not elsewhere classified: Secondary | ICD-10-CM | POA: Diagnosis present

## 2016-12-16 DIAGNOSIS — G8929 Other chronic pain: Secondary | ICD-10-CM | POA: Diagnosis present

## 2016-12-16 DIAGNOSIS — M25562 Pain in left knee: Secondary | ICD-10-CM | POA: Diagnosis not present

## 2016-12-16 DIAGNOSIS — M6281 Muscle weakness (generalized): Secondary | ICD-10-CM | POA: Diagnosis present

## 2016-12-16 DIAGNOSIS — M545 Low back pain: Secondary | ICD-10-CM | POA: Insufficient documentation

## 2016-12-16 NOTE — Therapy (Signed)
[place], [place], [address] Phone: [phone]   Fax:  [phone]  Physical Therapy Treatment/Discharge Summary  Patient Details  Name: Jennifer Estrada MRN: [id number] Date of Birth: [birth year] Referring Provider: Garald Balding, MD   Encounter Date: 12/16/2016  PT End of Session - 12/16/16 1148    Visit Number  12    Number of Visits  17    Date for PT Re-Evaluation  12/27/16    Authorization Type  UHC MCR    PT Start Time  1165    PT Stop Time  1222    PT Time Calculation (min)  34 min    Activity Tolerance  Patient tolerated treatment well    Behavior During Therapy  [facility] for tasks assessed/performed       Past Medical History:  Diagnosis Date  . Allergic rhinitis   . Chronic kidney disease   . Depression   . DJD (degenerative joint disease) of knee   . GERD (gastroesophageal reflux disease)   . Hyperlipidemia   . Migraine    Takes Propranolol  . Neuromuscular disorder ([place])    Body tremors at times,h/o falling cast right arm  . Osteoporosis   . Tremors of nervous system   . Vitamin D deficiency     Past Surgical History:  Procedure Laterality Date  . KNEE SURGERY    . Left Foot Surgery  20 years ago   20 years ago  . WRIST FRACTURE SURGERY      There were no vitals filed for this visit.  Subjective Assessment - 12/16/16 1148    Subjective  Pt reports she is finally feeling a little better in regards to the cold symptoms. Knees do not crackle as much.     Patient Stated Goals  get out of a chair, increase standing time, steps to enter home                      [facility] Adult PT Treatment/Exercise - 12/16/16 0001      Therapeutic Activites    Other Therapeutic Activities  stairs, sit<>stand      Knee/Hip Exercises: Stretches   Gastroc Stretch  2 reps;30 seconds    Gastroc Stretch Limitations  slant board      Knee/Hip Exercises: Aerobic   Nustep  6 min  L5             PT Education - 12/16/16 1233    Education provided  Yes    Education Details  goals discussion, importance of continued HEP and building habits    Person(s) Educated  Patient    Methods  Explanation;Demonstration;Tactile cues    Comprehension  Verbalized understanding;Returned demonstration       PT Short Term Goals - 11/15/16 1145      PT SHORT TERM GOAL #1   Title  pt will demo sit to stand without UE support from elevated seat    Baseline  able from elevated and low    Status  Achieved        PT Long Term Goals - 12/16/16 1152      PT LONG TERM GOAL #1   Title  Lumbar FOTO to 52% limitation, knee FOTO to  55% limitation    Baseline  see flowsheet    Status  Achieved      PT LONG TERM GOAL #2   Title  Pt will return to walking for long term  exercise    Baseline  limited right now due to cold, using bike rather than walking    Status  Partially Met      PT LONG TERM GOAL #3   Title  Pt will verbalize 50% improvement in ability to navigate steps to enter her home    Baseline  sometimes I can get up easily sometimes I have to pull myself up    Status  Partially Met      PT LONG TERM GOAL #4   Title  pt will be able to stand comfortably for at least 20 minutes to complete daily/self care activities    Baseline  30 min    Status  Achieved            Plan - 01/05/17 1230    Clinical Impression Statement  pt has met her goals at this time and is being d/c to independent program. Reviewed stairs and sit<>stand form today. Pt verbalized comfort and understanding of HEP and was encouraged to continue. Pt encouraged to contact us with any further questions.     PT Treatment/Interventions  ADLs/Self Care Home Management;Cryotherapy;Electrical Stimulation;Iontophoresis 63m/ml Dexamethasone;Functional mobility training;Stair training;Gait training;Ultrasound;Traction;Moist Heat;Therapeutic activities;Therapeutic exercise;Balance training;Neuromuscular  re-education;Patient/family education;Passive range of motion;Manual techniques;Dry needling;Taping       Patient will benefit from skilled therapeutic intervention in order to improve the following deficits and impairments:  Abnormal gait, Decreased range of motion, Difficulty walking, Increased muscle spasms, Decreased activity tolerance, Decreased endurance, Pain, Improper body mechanics, Impaired flexibility, Decreased strength, Postural dysfunction  Visit Diagnosis: Acute pain of left knee  Stiffness of left knee, not elsewhere classified  Chronic midline low back pain, with sciatica presence unspecified  Muscle weakness (generalized)  Difficulty in walking, not elsewhere classified   G-Codes - [id number]    Functional Assessment Tool Used (Outpatient Only)  FOTO, clinical judgement    Functional Limitation  Mobility: Walking and moving around    Mobility: Walking and Moving Around Goal Status [phone]  At least 40 percent but less than 60 percent impaired, limited or restricted    Mobility: Walking and Moving Around Discharge Status [phone]  At least 40 percent but less than 60 percent impaired, limited or restricted       Problem List Patient Active Problem List   Diagnosis Date Noted  . Patella fracture 02/12/2014  . Migraine syndrome 05/02/2012  . Anxiety attack 05/02/2012  . TIA (transient ischemic attack) 05/02/2012    PHYSICAL THERAPY DISCHARGE SUMMARY  Visits from Start of Care: 12  Current functional level related to goals / functional outcomes: See above   Remaining deficits: See above   Education / Equipment: Anatomy of condition, POC, HEP, exercise form/rationale  Plan: Patient agrees to discharge.  Patient goals were met. Patient is being discharged due to meeting the stated rehab goals.  ?????     Kaelob Persky C. Isabella Ida PT, DPT 12018-12-2510:35 PM   CChapel HillCSummer[facility] Center[address]GLiberty  [place] [address]Phone: [phone]  Fax:  [phone] Name: Jennifer BRICKEYMRN: [id number] of Birth: 1February 07, 1947

## 2017-02-21 ENCOUNTER — Encounter (INDEPENDENT_AMBULATORY_CARE_PROVIDER_SITE_OTHER): Payer: Self-pay | Admitting: Ophthalmology

## 2017-02-21 ENCOUNTER — Ambulatory Visit (HOSPITAL_COMMUNITY)
Admission: AD | Admit: 2017-02-21 | Discharge: 2017-02-21 | Disposition: A | Discharge status: Home / Self Care | Payer: Medicare Other | Source: Ambulatory Visit | Attending: Ophthalmology | Admitting: Ophthalmology

## 2017-02-21 ENCOUNTER — Ambulatory Visit (INDEPENDENT_AMBULATORY_CARE_PROVIDER_SITE_OTHER): Payer: Medicare Other | Admitting: Ophthalmology

## 2017-02-21 ENCOUNTER — Inpatient Hospital Stay (HOSPITAL_COMMUNITY): Payer: Medicare Other | Admitting: Certified Registered Nurse Anesthetist

## 2017-02-21 ENCOUNTER — Encounter (HOSPITAL_COMMUNITY)
Admission: AD | Disposition: A | Discharge status: Home / Self Care | Payer: Self-pay | Source: Ambulatory Visit | Attending: Ophthalmology

## 2017-02-21 DIAGNOSIS — M1990 Unspecified osteoarthritis, unspecified site: Secondary | ICD-10-CM | POA: Diagnosis not present

## 2017-02-21 DIAGNOSIS — F419 Anxiety disorder, unspecified: Secondary | ICD-10-CM | POA: Insufficient documentation

## 2017-02-21 DIAGNOSIS — Z885 Allergy status to narcotic agent status: Secondary | ICD-10-CM | POA: Diagnosis not present

## 2017-02-21 DIAGNOSIS — Z882 Allergy status to sulfonamides status: Secondary | ICD-10-CM | POA: Insufficient documentation

## 2017-02-21 DIAGNOSIS — Z888 Allergy status to other drugs, medicaments and biological substances status: Secondary | ICD-10-CM | POA: Insufficient documentation

## 2017-02-21 DIAGNOSIS — H4419 Other endophthalmitis: Secondary | ICD-10-CM | POA: Diagnosis present

## 2017-02-21 DIAGNOSIS — Z961 Presence of intraocular lens: Secondary | ICD-10-CM

## 2017-02-21 DIAGNOSIS — M810 Age-related osteoporosis without current pathological fracture: Secondary | ICD-10-CM | POA: Insufficient documentation

## 2017-02-21 DIAGNOSIS — E785 Hyperlipidemia, unspecified: Secondary | ICD-10-CM | POA: Diagnosis not present

## 2017-02-21 DIAGNOSIS — K219 Gastro-esophageal reflux disease without esophagitis: Secondary | ICD-10-CM | POA: Diagnosis not present

## 2017-02-21 DIAGNOSIS — E559 Vitamin D deficiency, unspecified: Secondary | ICD-10-CM | POA: Diagnosis not present

## 2017-02-21 DIAGNOSIS — Z880 Allergy status to penicillin: Secondary | ICD-10-CM | POA: Diagnosis not present

## 2017-02-21 DIAGNOSIS — H44002 Unspecified purulent endophthalmitis, left eye: Secondary | ICD-10-CM | POA: Diagnosis not present

## 2017-02-21 DIAGNOSIS — F329 Major depressive disorder, single episode, unspecified: Secondary | ICD-10-CM | POA: Diagnosis not present

## 2017-02-21 DIAGNOSIS — G252 Other specified forms of tremor: Secondary | ICD-10-CM | POA: Insufficient documentation

## 2017-02-21 HISTORY — PX: PARS PLANA VITRECTOMY: SHX2166

## 2017-02-21 LAB — BASIC METABOLIC PANEL
Anion gap: 10 (ref 5–15)
BUN: 8 mg/dL (ref 6–20)
CALCIUM: 9.5 mg/dL (ref 8.9–10.3)
CO2: 23 mmol/L (ref 22–32)
CREATININE: 0.88 mg/dL (ref 0.44–1.00)
Chloride: 104 mmol/L (ref 101–111)
GFR calc Af Amer: >60 mL/min (ref >60)
GLUCOSE: 93 mg/dL (ref 65–99)
POTASSIUM: 4.2 mmol/L (ref 3.5–5.1)
SODIUM: 137 mmol/L (ref 135–145)

## 2017-02-21 LAB — CBC
HCT: 39.5 % (ref 36.0–46.0)
Hemoglobin: 12.9 g/dL (ref 12.0–15.0)
MCH: 29.9 pg (ref 26.0–34.0)
MCHC: 32.7 g/dL (ref 30.0–36.0)
MCV: 91.4 fL (ref 78.0–100.0)
PLATELETS: 249 10*3/uL (ref 150–400)
RBC: 4.32 MIL/uL (ref 3.87–5.11)
RDW: 14.6 % (ref 11.5–15.5)
WBC: 9.9 10*3/uL (ref 4.0–10.5)

## 2017-02-21 LAB — GRAM STAIN

## 2017-02-21 SURGERY — PARS PLANA VITRECTOMY 25 GAUGE FOR ENDOPHTHALMITIS
Anesthesia: General | Site: Eye | Laterality: Left

## 2017-02-21 MED ORDER — CLINDAMYCIN INTRAOCULAR INJECTION 1 MG/0.1 ML KALEIDOSCOPE: 1.0000 mg | INTRAVITREAL | Status: DC

## 2017-02-21 MED ORDER — PROMETHAZINE HCL 25 MG/ML IJ SOLN: 6.2500 mg | INTRAMUSCULAR | Status: DC | PRN

## 2017-02-21 MED ORDER — DEXAMETHASONE SODIUM PHOSPHATE 10 MG/ML IJ SOLN
INTRAMUSCULAR | Status: DC | PRN
  Administered 2017-02-21: 1 mL

## 2017-02-21 MED ORDER — CEFTAZIDIME 1 G IJ SOLR
INTRAMUSCULAR | Status: AC
  Filled 2017-02-21: qty 1

## 2017-02-21 MED ORDER — ATROPINE SULFATE 1 % OP SOLN
OPHTHALMIC | Status: DC | PRN
  Administered 2017-02-21: 1 [drp] via OPHTHALMIC

## 2017-02-21 MED ORDER — ACETAZOLAMIDE SODIUM 500 MG IJ SOLR
INTRAMUSCULAR | Status: AC
  Filled 2017-02-21: qty 500

## 2017-02-21 MED ORDER — DORZOLAMIDE HCL-TIMOLOL MAL 2-0.5 % OP SOLN
OPHTHALMIC | Status: DC | PRN
  Administered 2017-02-21: 1 [drp] via OPHTHALMIC

## 2017-02-21 MED ORDER — PREDNISOLONE ACETATE 1 % OP SUSP
OPHTHALMIC | Status: AC
  Filled 2017-02-21: qty 5

## 2017-02-21 MED ORDER — FENTANYL CITRATE (PF) 250 MCG/5ML IJ SOLN
INTRAMUSCULAR | Status: AC
Start: 2017-02-21 — End: ?
  Filled 2017-02-21: qty 5

## 2017-02-21 MED ORDER — LACTATED RINGERS IV SOLN
INTRAVENOUS | Status: DC | PRN
  Administered 2017-02-21: 16:00:00 via INTRAVENOUS

## 2017-02-21 MED ORDER — DEXAMETHASONE SODIUM PHOSPHATE 10 MG/ML IJ SOLN
INTRAMUSCULAR | Status: AC
  Filled 2017-02-21: qty 1

## 2017-02-21 MED ORDER — NA CHONDROIT SULF-NA HYALURON 40-30 MG/ML IO SOLN
INTRAOCULAR | Status: DC | PRN
  Administered 2017-02-21 (×4): 0.5 mL via INTRAOCULAR

## 2017-02-21 MED ORDER — BSS PLUS IO SOLN
INTRAOCULAR | Status: AC
  Filled 2017-02-21: qty 500

## 2017-02-21 MED ORDER — BACITRACIN-POLYMYXIN B 500-10000 UNIT/GM OP OINT
TOPICAL_OINTMENT | OPHTHALMIC | Status: DC | PRN
  Administered 2017-02-21: 1 via OPHTHALMIC

## 2017-02-21 MED ORDER — LIDOCAINE HCL (PF) 2 % IJ SOLN
INTRAMUSCULAR | Status: AC
  Filled 2017-02-21: qty 10

## 2017-02-21 MED ORDER — ACETAMINOPHEN 500 MG PO TABS
1000.0000 mg | ORAL_TABLET | Freq: Four times a day (QID) | ORAL | Status: DC | PRN
  Administered 2017-02-21: 1000 mg via ORAL

## 2017-02-21 MED ORDER — BUPIVACAINE HCL (PF) 0.75 % IJ SOLN
INTRAMUSCULAR | Status: AC
  Filled 2017-02-21: qty 10

## 2017-02-21 MED ORDER — DEXTROSE 50 % IV SOLN
INTRAVENOUS | Status: AC
  Filled 2017-02-21: qty 50

## 2017-02-21 MED ORDER — CEFTAZIDIME INTRAVITREAL INJECTION 2.25 MG/0.1 ML: 2.2500 mg | INTRAVITREAL | Status: DC

## 2017-02-21 MED ORDER — NA CHONDROIT SULF-NA HYALURON 40-30 MG/ML IO SOLN
INTRAOCULAR | Status: AC
  Filled 2017-02-21: qty 1

## 2017-02-21 MED ORDER — TRIAMCINOLONE ACETONIDE 40 MG/ML IJ SUSP
INTRAMUSCULAR | Status: AC
  Filled 2017-02-21: qty 5

## 2017-02-21 MED ORDER — BSS PLUS IO SOLN
INTRAOCULAR | Status: DC | PRN
  Administered 2017-02-21: 1 via INTRAOCULAR

## 2017-02-21 MED ORDER — SODIUM CHLORIDE 0.9 % IJ SOLN
INTRAMUSCULAR | Status: AC
  Filled 2017-02-21: qty 10

## 2017-02-21 MED ORDER — PREDNISOLONE ACETATE 1 % OP SUSP
OPHTHALMIC | Status: DC | PRN
  Administered 2017-02-21: 1 [drp] via OPHTHALMIC

## 2017-02-21 MED ORDER — CARBACHOL 0.01 % IO SOLN
INTRAOCULAR | Status: AC
  Filled 2017-02-21: qty 1.5

## 2017-02-21 MED ORDER — ONDANSETRON HCL 4 MG/2ML IJ SOLN
INTRAMUSCULAR | Status: DC | PRN
  Administered 2017-02-21: 4 mg via INTRAVENOUS

## 2017-02-21 MED ORDER — SUGAMMADEX SODIUM 200 MG/2ML IV SOLN
INTRAVENOUS | Status: DC | PRN
  Administered 2017-02-21: 148.8 mg via INTRAVENOUS

## 2017-02-21 MED ORDER — MEPERIDINE HCL 25 MG/ML IJ SOLN: 6.2500 mg | INTRAMUSCULAR | Status: DC | PRN

## 2017-02-21 MED ORDER — LIDOCAINE HCL (CARDIAC) 20 MG/ML IV SOLN
INTRAVENOUS | Status: DC | PRN
  Administered 2017-02-21: 100 mg via INTRAVENOUS

## 2017-02-21 MED ORDER — LIDOCAINE HCL (PF) 4 % IJ SOLN
INTRAMUSCULAR | Status: AC
  Filled 2017-02-21: qty 5

## 2017-02-21 MED ORDER — FENTANYL CITRATE (PF) 100 MCG/2ML IJ SOLN
INTRAMUSCULAR | Status: DC | PRN
  Administered 2017-02-21: 50 ug via INTRAVENOUS
  Administered 2017-02-21 (×3): 25 ug via INTRAVENOUS
  Administered 2017-02-21 (×2): 50 ug via INTRAVENOUS

## 2017-02-21 MED ORDER — BRIMONIDINE TARTRATE 0.2 % OP SOLN
OPHTHALMIC | Status: AC
  Filled 2017-02-21: qty 5

## 2017-02-21 MED ORDER — GATIFLOXACIN 0.5 % OP SOLN
OPHTHALMIC | Status: AC
  Filled 2017-02-21: qty 2.5

## 2017-02-21 MED ORDER — BRIMONIDINE TARTRATE 0.2 % OP SOLN
OPHTHALMIC | Status: DC | PRN
  Administered 2017-02-21: 1 [drp] via OPHTHALMIC

## 2017-02-21 MED ORDER — EPINEPHRINE PF 1 MG/ML IJ SOLN
INTRAMUSCULAR | Status: DC | PRN
  Administered 2017-02-21: .3 mg

## 2017-02-21 MED ORDER — NA CHONDROIT SULF-NA HYALURON 40-30 MG/ML IO SOLN
INTRAOCULAR | Status: AC
  Filled 2017-02-21: qty 0.5

## 2017-02-21 MED ORDER — DORZOLAMIDE HCL-TIMOLOL MAL 2-0.5 % OP SOLN
OPHTHALMIC | Status: AC
  Filled 2017-02-21: qty 10

## 2017-02-21 MED ORDER — VANCOMYCIN INTRAVITREAL INJECTION 1 MG/0.1 ML
1.0000 mg | INTRAOCULAR | Status: DC
  Filled 2017-02-21: qty 0.1

## 2017-02-21 MED ORDER — PHENYLEPHRINE HCL 10 % OP SOLN: 1.0000 [drp] | OPHTHALMIC | Status: DC | PRN

## 2017-02-21 MED ORDER — CEFTAZIDIME 1 G IJ SOLR
INTRAMUSCULAR | Status: DC | PRN
  Administered 2017-02-21: 20 mL

## 2017-02-21 MED ORDER — TRIAMCINOLONE ACETONIDE 40 MG/ML IJ SUSP
INTRAMUSCULAR | Status: DC | PRN
  Administered 2017-02-21: 40 mg

## 2017-02-21 MED ORDER — INDOCYANINE GREEN 25 MG IV SOLR
INTRAVENOUS | Status: AC
  Filled 2017-02-21: qty 25

## 2017-02-21 MED ORDER — BSS IO SOLN
INTRAOCULAR | Status: AC
  Filled 2017-02-21: qty 15

## 2017-02-21 MED ORDER — CEFTAZIDIME INTRAVITREAL INJECTION 2.25 MG/0.1 ML
2.2500 mg | INTRAVITREAL | Status: DC
  Filled 2017-02-21: qty 0.1

## 2017-02-21 MED ORDER — ATROPINE SULFATE 1 % OP SOLN
OPHTHALMIC | Status: AC
  Filled 2017-02-21: qty 5

## 2017-02-21 MED ORDER — PROPOFOL 10 MG/ML IV BOLUS
INTRAVENOUS | Status: DC | PRN
  Administered 2017-02-21: 110 mg via INTRAVENOUS

## 2017-02-21 MED ORDER — VANCOMYCIN INTRAVITREAL INJECTION 1 MG/0.1 ML
1.0000 mg | INTRAOCULAR | Status: AC
  Administered 2017-02-21: 1 mg via INTRAVITREAL

## 2017-02-21 MED ORDER — MIDAZOLAM HCL 2 MG/2ML IJ SOLN
INTRAMUSCULAR | Status: AC
  Filled 2017-02-21: qty 2

## 2017-02-21 MED ORDER — EPINEPHRINE PF 1 MG/ML IJ SOLN
INTRAMUSCULAR | Status: AC
  Filled 2017-02-21: qty 1

## 2017-02-21 MED ORDER — BACITRACIN-POLYMYXIN B 500-10000 UNIT/GM OP OINT
TOPICAL_OINTMENT | OPHTHALMIC | Status: AC
  Filled 2017-02-21: qty 3.5

## 2017-02-21 MED ORDER — GATIFLOXACIN 0.5 % OP SOLN
OPHTHALMIC | Status: DC | PRN
  Administered 2017-02-21: 1 [drp] via OPHTHALMIC

## 2017-02-21 MED ORDER — VANCOMYCIN INTRAVITREAL INJECTION 1 MG/0.1 ML: 1.0000 mg | INTRAOCULAR | Status: DC

## 2017-02-21 MED ORDER — FENTANYL CITRATE (PF) 100 MCG/2ML IJ SOLN: 25.0000 ug | INTRAMUSCULAR | Status: DC | PRN

## 2017-02-21 MED ORDER — STERILE WATER FOR INJECTION IJ SOLN
INTRAMUSCULAR | Status: AC
  Filled 2017-02-21: qty 10

## 2017-02-21 MED ORDER — CEFTAZIDIME INTRAVITREAL INJECTION 2.25 MG/0.1 ML
2.2500 mg | INTRAVITREAL | Status: AC
  Administered 2017-02-21: 2.25 mg via INTRAVITREAL

## 2017-02-21 MED ORDER — PREDNISOLONE ACETATE 1 % OP SUSP: 1.0000 [drp] | OPHTHALMIC | Status: DC

## 2017-02-21 MED ORDER — ROCURONIUM BROMIDE 100 MG/10ML IV SOLN
INTRAVENOUS | Status: DC | PRN
  Administered 2017-02-21: 50 mg via INTRAVENOUS

## 2017-02-21 MED ORDER — NA CHONDROIT SULF-NA HYALURON 40-30 MG/ML IO SOLN
INTRAOCULAR | Status: DC | PRN
  Administered 2017-02-21: 0.5 mL via INTRAOCULAR

## 2017-02-21 MED ORDER — SODIUM CHLORIDE 0.9 % IV SOLN: INTRAVENOUS | Status: DC

## 2017-02-21 MED ORDER — TROPICAMIDE 1 % OP SOLN: 1.0000 [drp] | OPHTHALMIC | Status: DC | PRN

## 2017-02-21 MED ORDER — CEFEPIME INTRAOCULAR INJECTION 2.25 MG/0.1 ML KALEIDOSCOPE: 2.2500 mg | INTRAVITREAL | Status: DC

## 2017-02-21 MED ORDER — GATIFLOXACIN 0.5 % OP SOLN: 1.0000 [drp] | OPHTHALMIC | Status: DC | PRN

## 2017-02-21 MED ORDER — PROPARACAINE HCL 0.5 % OP SOLN
1.0000 [drp] | OPHTHALMIC | Status: DC | PRN
Start: 2017-02-21 — End: 2017-02-22

## 2017-02-21 MED ORDER — TOBRAMYCIN-DEXAMETHASONE 0.3-0.1 % OP OINT
TOPICAL_OINTMENT | OPHTHALMIC | Status: AC
  Filled 2017-02-21: qty 3.5

## 2017-02-21 MED ORDER — ACETAMINOPHEN 500 MG PO TABS
ORAL_TABLET | ORAL | Status: AC
  Filled 2017-02-21: qty 2

## 2017-02-21 MED ORDER — ATROPINE SULFATE 1 % OP SOLN
1.0000 [drp] | OPHTHALMIC | Status: DC | PRN
Start: 2017-02-21 — End: 2017-02-22

## 2017-02-21 SURGICAL SUPPLY — 63 items
APPLICATOR COTTON TIP 6IN STRL (MISCELLANEOUS) ×8 IMPLANT
APPLICATOR DR MATTHEWS STRL (MISCELLANEOUS) IMPLANT
BANDAGE EYE OVAL (MISCELLANEOUS) ×2 IMPLANT
BLADE EYE CATARACT 19 1.4 BEAV (BLADE) ×2 IMPLANT
CANNULA ANT/CHMB 27GA (MISCELLANEOUS) ×2 IMPLANT
CANNULA FLEX TIP 25G (CANNULA) ×2 IMPLANT
CANNULA VLV SOFT TIP 25GA (OPHTHALMIC) ×2 IMPLANT
CORDS BIPOLAR (ELECTRODE) ×2 IMPLANT
COTTONBALL LRG STERILE PKG (GAUZE/BANDAGES/DRESSINGS) ×6 IMPLANT
COVER MAYO STAND STRL (DRAPES) ×2 IMPLANT
DRAPE OPHTHALMIC 77X100 STRL (CUSTOM PROCEDURE TRAY) ×2 IMPLANT
FILTER BLUE MILLIPORE (MISCELLANEOUS) IMPLANT
FORCEPS ECKARDT ILM 25G SERR (OPHTHALMIC RELATED) IMPLANT
FORCEPS GRIESHABER ILM 25G A (INSTRUMENTS) IMPLANT
FORCEPS HORIZONTAL 25G DISP (OPHTHALMIC RELATED) IMPLANT
FORCEPS ILM 25G DSP TIP (MISCELLANEOUS) IMPLANT
GAS OPHTHALMIC (MISCELLANEOUS) IMPLANT
GLOVE BIO SURGEON STRL SZ7.5 (GLOVE) ×6 IMPLANT
GLOVE SS BIOGEL STRL SZ 6.5 (GLOVE) IMPLANT
GLOVE SS BIOGEL STRL SZ 7 (GLOVE) ×1 IMPLANT
GLOVE SUPERSENSE BIOGEL SZ 6.5 (GLOVE)
GLOVE SUPERSENSE BIOGEL SZ 7 (GLOVE) ×1
GLOVE SURG 8.5 LATEX PF (GLOVE) IMPLANT
GOWN STRL REUS W/ TWL LRG LVL3 (GOWN DISPOSABLE) ×2 IMPLANT
GOWN STRL REUS W/TWL LRG LVL3 (GOWN DISPOSABLE) ×2
HANDLE PNEUMATIC FOR CONSTEL (OPHTHALMIC) IMPLANT
KIT BASIN OR (CUSTOM PROCEDURE TRAY) ×2 IMPLANT
KIT ROOM TURNOVER OR (KITS) ×2 IMPLANT
MICROPICK 25G (MISCELLANEOUS) ×2
NEEDLE 18GX1X1/2 (RX/OR ONLY) (NEEDLE) ×4 IMPLANT
NEEDLE 25GX 5/8IN NON SAFETY (NEEDLE) ×6 IMPLANT
NEEDLE HYPO 30X.5 LL (NEEDLE) ×6 IMPLANT
NS IRRIG 1000ML POUR BTL (IV SOLUTION) ×2 IMPLANT
PACK VITRECTOMY CUSTOM (CUSTOM PROCEDURE TRAY) ×2 IMPLANT
PAD ARMBOARD 7.5X6 YLW CONV (MISCELLANEOUS) ×4 IMPLANT
PAK PIK VITRECTOMY CVS 25GA (OPHTHALMIC) ×4 IMPLANT
PENCIL BIPOLAR 25GA STR DISP (OPHTHALMIC RELATED) ×2 IMPLANT
PIC ILLUMINATED 25G (OPHTHALMIC) ×2
PICK MICROPICK 25G (MISCELLANEOUS) ×1 IMPLANT
PIK ILLUMINATED 25G (OPHTHALMIC) ×1 IMPLANT
PROBE LASER ILLUM FLEX CVD 25G (OPHTHALMIC) IMPLANT
RETRACTOR IRIS FLEX 25G GRIESH (INSTRUMENTS) ×2 IMPLANT
ROLLS DENTAL (MISCELLANEOUS) ×4 IMPLANT
SCRAPER DIAMOND 25GA (OPHTHALMIC RELATED) IMPLANT
SET INJECTOR OIL FLUID CONSTEL (OPHTHALMIC) IMPLANT
SPONGE SURGIFOAM ABS GEL 12-7 (HEMOSTASIS) IMPLANT
STOPCOCK 4 WAY LG BORE MALE ST (IV SETS) IMPLANT
SUT CHROMIC 7 0 TG140 8 (SUTURE) IMPLANT
SUT ETHILON 10 0 CS140 6 (SUTURE) ×4 IMPLANT
SUT ETHILON 9 0 TG140 8 (SUTURE) IMPLANT
SUT SILK 4 0 RB 1 (SUTURE) IMPLANT
SUT VICRYL 7 0 TG140 8 (SUTURE) ×2 IMPLANT
SWAB COLLECTION DEVICE MRSA (MISCELLANEOUS) IMPLANT
SWAB CULTURE ESWAB REG 1ML (MISCELLANEOUS) IMPLANT
SYR 10ML LL (SYRINGE) ×4 IMPLANT
SYR 20CC LL (SYRINGE) ×4 IMPLANT
SYR 5ML LL (SYRINGE) IMPLANT
SYR BULB 3OZ (MISCELLANEOUS) ×2 IMPLANT
SYR TB 1ML LUER SLIP (SYRINGE) ×8 IMPLANT
TOWEL OR 17X24 6PK STRL BLUE (TOWEL DISPOSABLE) ×6 IMPLANT
TUBING HIGH PRESS EXTEN 6IN (TUBING) ×2 IMPLANT
WATER STERILE IRR 1000ML POUR (IV SOLUTION) ×2 IMPLANT
WIPE INSTRUMENT VISIWIPE 73X73 (MISCELLANEOUS) ×2 IMPLANT

## 2017-02-21 NOTE — H&P (Signed)
Jennifer MillinerBrenda L Estrada is an 72 y.o. female.    Chief Complaint: decreased vision, left eye  HPI: Pt is 72 yo WF, POD4 s/p phaco/PCIOL with Dr. Dione BoozeGroat. Awoke this morning with eye pain and decreased vision OS. Found to have endophthalmitis OS.  Past Medical History:  Diagnosis Date  . Allergic rhinitis   . Chronic kidney disease   . Depression   . DJD (degenerative joint disease) of knee   . GERD (gastroesophageal reflux disease)   . Hyperlipidemia   . Migraine    Takes Propranolol  . Neuromuscular disorder ([facility])    Body tremors at times,h/o falling cast right arm  . Osteoporosis   . Tremors of nervous system   . Vitamin D deficiency     Past Surgical History:  Procedure Laterality Date  . KNEE SURGERY    . Left Foot Surgery  20 years ago   20 years ago  . ORIF PATELLA Left 02/12/2014   Procedure: OPEN REDUCTION INTERNAL (ORIF) FIXATION LEFT PATELLA;  Surgeon: Shelda PalMatthew D Olin, MD;  [facility];  Service: Orthopedics;  Laterality: Left;  . WRIST FRACTURE SURGERY      No family history on file. Social History:  reports that  has never smoked. she has never used smokeless tobacco. She reports that she does not drink alcohol or use drugs.  Allergies:  Allergies  Allergen Reactions  . Atorvastatin Other (See Comments)    Feels anxous  . Cortisone Other (See Comments)    headache  . Oxycodone Other (See Comments)    confusion  . Paroxetine Other (See Comments)    anxiety  . Penicillins   . Pravastatin Other (See Comments)    muscle pain   . Singulair [Montelukast] Other (See Comments)    Chest pain  . Sulfa Antibiotics Nausea Only  . Augmentin [Amoxicillin-Pot Clavulanate] Rash  . Prednisone Other (See Comments)    headaches    No medications prior to admission.    Review of systems otherwise negative  There were no vitals taken for this visit.  Physical exam: Mental status: oriented x3. Eyes: See eye exam associated with this date of surgery Ears, Nose,  Throat: within normal limits Neck: Within Normal limits General: within normal limits Chest: Within normal limits Breast: deferred Heart: Within normal limits Abdomen: Within normal limits GU: deferred Extremities: within normal limits Skin: within normal limits  Assessment/Plan 1. Post operative endophthalmitis OS POD4 s/p phaco/PCIOL OS VA HM OS with hypopyon and pupillary membrane  [facility], culture, PPV, and intravitreal antibiotics OS, under general anesthesia  Karie ChimeraBrian G. Hooper Petteway, M.D., Ph.D. Vitreoretinal Surgeon [facility]

## 2017-02-21 NOTE — Brief Op Note (Signed)
Cefepime 2.25mg /0.611ml dispensed to table for ophthalmic injection

## 2017-02-21 NOTE — Anesthesia Procedure Notes (Signed)
Procedure Name: Intubation Date/Time: 02/21/2017 4:28 PM Performed by: Clearnce Sorrel, CRNA Pre-anesthesia Checklist: Patient identified, Emergency Drugs available, Suction available, Patient being monitored and Timeout performed Patient Re-evaluated:Patient Re-evaluated prior to induction Oxygen Delivery Method: Circle system utilized Preoxygenation: Pre-oxygenation with 100% oxygen Induction Type: IV induction Ventilation: Mask ventilation without difficulty Laryngoscope Size: Mac and 3 Grade View: Grade I Tube type: Oral Tube size: 7.0 mm Number of attempts: 1 Airway Equipment and Method: Stylet Placement Confirmation: ETT inserted through vocal cords under direct vision,  positive ETCO2 and breath sounds checked- equal and bilateral Secured at: 22 cm Tube secured with: Tape Dental Injury: Teeth and Oropharynx as per pre-operative assessment

## 2017-02-21 NOTE — Progress Notes (Signed)
[facility] - Clinic Note  02/21/2017     CHIEF COMPLAINT Patient presents for Retina Evaluation   HISTORY OF PRESENT ILLNESS: Jennifer Estrada is a 72 y.o. female who presents to the clinic today for:   HPI    Retina Evaluation    In left eye.  This started 3 days ago.  Associated Symptoms Pain.  Negative for Blind Spot, Glare, Shoulder/Hip pain, Jaw Claudication, Fatigue, Photophobia, Distortion, Floaters, Redness, Scalp Tenderness, Weight Loss, Fever, Trauma and Flashes.  Context:  distance vision, mid-range vision and near vision.  Treatments tried include surgery and eye drops.  Response to treatment was mild improvement.  I, the attending physician,  performed the HPI with the patient and updated documentation appropriately.          Comments    Referral of Dr. Hortense Ramal for retina evaluation. Patient states she had cataract  sx on Monday 02/20/15. Pt reports surgery was successful per Dr. Dione Booze. Pt states she woke this am with "sharp pain left lower cheek area and felt as if she had a contact lens in her eye". Pt reports using "Pred ace-gatiflox-bromfen" Qid Os. Takes multivitamin qd       Last edited by [name], MD on 02/21/2017  2:46 PM. (History)    Pt reports that she had cat sx OS on Monday (01.07.19) and states the day after she was "seeing good"; Pt states that everything went well; Pt reports she woke up this morning with "sharp pain", pt reports that it felt like "sinus pain"; Pt reports she felt as if she had a contact in OS; Pt reports she called Dr. Thurmon Fair office and was seen on an emergent basis; Pt states she last ate or drank this morning at 8:30;   Referring physician: Ernesto Rutherford, MD [address] [place], [place] [address]  HISTORICAL INFORMATION:   Selected notes from the MEDICAL RECORD NUMBER Referred by Dr. Hortense Ramal for concern of endophthalmitis OS  LEE- 01.11.19  Ocular Hx- CE/toric IOL OS (01.07.19 - R. Groat) - VA 1 day  PO OS 20/30, VA OS (01.11.19) HM PMH-     CURRENT MEDICATIONS: No current outpatient medications on file. (Ophthalmic Drugs)   No current facility-administered medications for this visit.  (Ophthalmic Drugs)   Current Outpatient Medications (Other)  Medication Sig  . Calcium Carbonate-Vitamin D (CALCIUM + D PO) Take 1 tablet by mouth daily.  . chlorhexidine (PERIDEX) 0.12 % solution Use as directed 15 mLs in the mouth or throat daily as needed (sores).   Marland Kitchen dexlansoprazole (DEXILANT) 60 MG capsule Take 60 mg by mouth daily.  Marland Kitchen docusate sodium 100 MG CAPS Take 100 mg by mouth 2 (two) times daily.  . fexofenadine (ALLEGRA) 30 MG tablet Take 30 mg by mouth as needed (allergies).   Marland Kitchen PARoxetine (PAXIL) 20 MG tablet Take 20 mg by mouth every morning.  . propranolol ER (INDERAL LA) 80 MG 24 hr capsule Take 1 capsule (80 mg total) by mouth daily. (Patient taking differently: Take 60 mg by mouth daily. )  . rosuvastatin (CRESTOR) 5 MG tablet Take 5 mg by mouth daily.   No current facility-administered medications for this visit.  (Other)      REVIEW OF SYSTEMS: ROS    Positive for: Genitourinary, Eyes, Psychiatric   Negative for: Constitutional, Gastrointestinal, Neurological, Skin, Musculoskeletal, HENT, Endocrine, Cardiovascular, Respiratory, Allergic/Imm, Heme/Lymph   Last edited by Eldridge Scot, LPN on 07/16/5407  2:10 PM. (History)       ALLERGIES Allergies  Allergen Reactions  . Atorvastatin Other (See Comments)    Feels anxous  . Cortisone Other (See Comments)    headache  . Oxycodone Other (See Comments)    confusion  . Paroxetine Other (See Comments)    anxiety  . Penicillins Other (See Comments)    Unknown per pt  . Pravastatin Other (See Comments)    muscle pain   . Singulair [Montelukast] Other (See Comments)    Chest pain  . Sulfa Antibiotics Nausea Only  . Aleve [Naproxen Sodium] Rash  . Augmentin [Amoxicillin-Pot Clavulanate] Rash  . Prednisone Other  (See Comments)    headaches    PAST MEDICAL HISTORY Past Medical History:  Diagnosis Date  . Allergic rhinitis   . Chronic kidney disease   . Depression   . DJD (degenerative joint disease) of knee   . GERD (gastroesophageal reflux disease)   . Hyperlipidemia   . Migraine    Takes Propranolol  . Neuromuscular disorder (HCC)    Body tremors at times,h/o falling cast right arm  . Osteoporosis   . Tremors of nervous system   . Vitamin D deficiency    Past Surgical History:  Procedure Laterality Date  . KNEE SURGERY    . Left Foot Surgery  20 years ago   20 years ago  . ORIF PATELLA Left 02/12/2014   Procedure: OPEN REDUCTION INTERNAL (ORIF) FIXATION LEFT PATELLA;  Surgeon: Shelda PalMatthew D Olin, MD;  Location: [facility];  Service: Orthopedics;  Laterality: Left;  . PARS PLANA VITRECTOMY Left 02/21/2017   Procedure: PARS PLANA VITRECTOMY 25 GAUGE FOR ENDOPHTHALMITIS;  Surgeon: Rennis ChrisZamora, Gilford Lardizabal, MD;  [facility];  Service: Ophthalmology;  Laterality: Left;  . WRIST FRACTURE SURGERY      FAMILY HISTORY History reviewed. No pertinent family history.  SOCIAL HISTORY Social History   Tobacco Use  . Smoking status: Never Smoker  . Smokeless tobacco: Never Used  Substance Use Topics  . Alcohol use: No  . Drug use: No         OPHTHALMIC EXAM:  Base Eye Exam    Visual Acuity (Snellen - Linear)      Right Left   Dist [place] 20/30 HM       Tonometry (Tonopen, 2:13 PM)      Right Left   Pressure 13 16       Neuro/Psych    Oriented x3:  Yes   Mood/Affect:  Normal       Dilation    Left eye:  1.0% Mydriacyl, 2.5% Phenylephrine @ 2:13 PM        Slit Lamp and Fundus Exam    Slit Lamp Exam      Right Left   Lids/Lashes Normal Normal   Conjunctiva/Sclera White and quiet 2+ Injection   Cornea Arcus Arcus, Edema   Anterior Chamber Deep and quiet 0.8 mm hypopyon, 4+ Cell, Fibrin, pupillary membrane   Iris Round and dilated Irregular pupil dilated to 3mm, Posterior synechiae;  fibrin throughout AC overlying iris and pupil   Lens 2-3+ Nuclear sclerosis, 2+ Cortical cataract PCIOL; fibrin membrane overlying lens and pupil   Vitreous Vitreous syneresis no view; reduced red reflex       Fundus Exam      Right Left   Disc sharp no view   C/D Ratio 0.5    Macula Flat, Retinal pigment epithelial mottling, No heme or edema    Vessels Normal  Periphery Attached    Poor view in left eye          IMAGING AND PROCEDURES  Imaging and Procedures for 02/21/17  B-scan Ultrasound    Quality was good. Findings included vitreous opacities.   Notes Impression:  LEFT EYE: Dense vitreous opacities w/ hyperechoic signal on posterior retinal surface. Consistent with endophthalmitis, left eye. No mass or retinal detachment or retinal tears noted.                ASSESSMENT/PLAN:    ICD-10-CM   1. 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 Endophthalmitis OS-  - POD4 s/p phaco/PCIOL - VA HM OS w/ 0.11mm hypopyon, fibrin membrane and irregular pupil, and dense vitreous opacities on b-scan ultrasound - discussed findings, guarded prognosis and treatment options including emergent intravitreal tap/inject and pars plana vitrectomy w/ intravitreal antibiotics - patient wishes to proceed with surgery - RBA of procedure discussed, questions answered - informed consent obtained and signed - emergent PPV + AC washout + intravitreal antibiotics scheduled in OR 8, [facility] under general anesthesia - f/u tomorrow for POD1  2. Pseudophakia OS - s/p phaco/PCIOL on Monday, 11.7.19 - see above   Ophthalmic Meds Ordered this visit:  No orders of the defined types were placed in this encounter.      Return in about 1 day (around 02/22/2017) for POV.  There are no Patient Instructions on file for this visit.   Explained the diagnoses, plan, and follow up with the patient and they expressed  understanding.  Patient expressed understanding of the importance of proper follow up care.   This document serves as a record of services personally performed by Karie Chimera, MD, PhD. It was created on their behalf by Virgilio Belling, COA, a certified ophthalmic assistant. The creation of this record is the provider's dictation and/or activities during the visit.  Electronically signed by: Virgilio Belling, COA  02/22/17 7:04 AM    Karie Chimera, M.D., Ph.D. Diseases & Surgery of the Retina and Vitreous [facility] 02/22/17  I have reviewed the above documentation for accuracy and completeness, and I agree with the above. Karie Chimera, M.D., Ph.D. 02/22/17 7:13 AM   Abbreviations: M myopia (nearsighted); A astigmatism; H hyperopia (farsighted); P presbyopia; Mrx spectacle prescription;  CTL contact lenses; OD right eye; OS left eye; OU both eyes  XT exotropia; ET esotropia; PEK punctate epithelial keratitis; PEE punctate epithelial erosions; DES dry eye syndrome; MGD meibomian gland dysfunction; ATs artificial tears; PFAT's preservative free artificial tears; NSC nuclear sclerotic cataract; PSC posterior subcapsular cataract; ERM epi-retinal membrane; PVD posterior vitreous detachment; RD retinal detachment; DM diabetes mellitus; DR diabetic retinopathy; NPDR non-proliferative diabetic retinopathy; PDR proliferative diabetic retinopathy; CSME clinically significant macular edema; DME diabetic macular edema; dbh dot blot hemorrhages; CWS cotton wool spot; POAG primary open angle glaucoma; C/D cup-to-disc ratio; HVF humphrey visual field; GVF goldmann visual field; OCT optical coherence tomography; IOP intraocular pressure; BRVO Branch retinal vein occlusion; CRVO central retinal vein occlusion; CRAO central retinal artery occlusion; BRAO branch retinal artery occlusion; RT retinal tear; SB scleral buckle; PPV pars plana vitrectomy; VH Vitreous hemorrhage; PRP  panretinal laser photocoagulation; IVK intravitreal kenalog; VMT vitreomacular traction; MH Macular hole;  NVD neovascularization of the disc; NVE neovascularization elsewhere; AREDS age related eye disease study; ARMD age related macular degeneration; POAG primary open angle glaucoma; EBMD  epithelial/anterior basement membrane dystrophy; ACIOL anterior chamber intraocular lens; IOL intraocular lens; PCIOL posterior chamber intraocular lens; Phaco/IOL phacoemulsification with intraocular lens placement; [place] photorefractive keratectomy; LASIK laser assisted in situ keratomileusis; HTN hypertension; DM diabetes mellitus; COPD chronic obstructive pulmonary disease

## 2017-02-21 NOTE — Op Note (Signed)
Date of procedure: 1.11.19  Surgeon: Rennis ChrisBrian Aliea Bobe, M.D., Ph.D  Pre-operative Diagnosis: 1. Endophthalmitis, Left eye  Post-operative diagnosis: Same  Anesthesia: General  Procedure: 1)   25 gauge pars plana vitrectomy w/ intravitreal antibiotics, Left Eye 2)     AC washout with membrane peel  Complications: none Estimated blood loss: minimal Specimens: none  Brief history: The patient has a history of decreased vision in the affected eye, and on examination, was noted to have endophthalmitis, left eye.The risks, benefits, and alternatives were explained to the patient, including pain, bleeding, infection, loss of vision, double vision, droopy eyelids, and need for more surgeries. Informed consent was obtained from the patient and placed in the chart.   Procedure: The patient was brought to the preoperative holding area where the correct eye was confirmed and marked. The patient was then brought to the operating room where anesthetic was given and general endotracheal anesthesia was induced by the Anesthesia team. A secondary time-out was performed to identify the correct patient, eye, procedure, and any allergies. The eye was prepped and draped in the usual sterile ophthalmic fashion followed by placement of a lid speculum.  A 25 gauge trocar was placed in the inferotemporal quadrant 4 mm posterior to the limbus in a beveled fashion. Two additional 25 gauge trocars were placed in the superonasal and superotemporal quadrants in a similar beveled fashion. A 4 mm infusion cannula was placed through the inferotemporal trocar but left clamped. The light pipe and cutter were introduced into the eye. An undiluted vitreous sample of about 2.5 cc was obtained using a 5 cc syringe attached to the vitrectomy probe. The infusion was then turned on. Dense fibrin membrane and vitreous debris obscured the view of the retina.  At this time, a 20 g MVR blade was used to create  superonasal and inferotemporal paracenteses to performed an AC washout. Next a standard three-port pars plana vitrectomy was performed using the light pipe, the cutter, and the BIOM viewing system. Notably, there was abundant vitreous debris obscuring much of the view of the retina. As thorough of a core and peripheral vitreous dissection as was deemed safe was performed. Visualization of the retina never improved sufficiently to see any real detail [facility] and [facility]re[facility]ac[facility].  Attention was then turned back to the anterior chamber where MaxGrip forceps were used to try to peel the fibrin membranes. The iris began to prolapse through the temporal cataract wound and the paracenteses. An iris sweep and viscoat were used to reposit the iris and the corneal wounds were closed with 10-0 nylon sutures.   Compounded intravitreal antibiotics (vanc, ceftaz, and cefepim) were then injected through the superonasal trocar. The trocars were then removed and sutured with 7-0 vicryl in an interrupted fashion. Subconjunctival injections of antibiotic and Decadron were administered. The lid speculum and drapes were removed. Drops of an antibiotic and steroid were given. The eye was patched and shielded. The patient tolerated the procedure well. The patient was taken to the recovery room in good condition. The patient was instructed to follow-up with Dr. Vanessa BarbaraZamora in clinic on the following morning.

## 2017-02-21 NOTE — Brief Op Note (Signed)
02/21/2017  7:49 PM  PATIENT:  Jennifer Estrada  72 y.o. female  PRE-OPERATIVE DIAGNOSIS:  infected eye left   POST-OPERATIVE DIAGNOSIS:  infected eye left   PROCEDURE:  Procedure(s): PARS PLANA VITRECTOMY 25 GAUGE FOR ENDOPHTHALMITIS (Left)  SURGEON:  Surgeon(s) and Role:    [name]* Larie Mathes, MD - Primary  ASSISTANTS: none   ANESTHESIA:   general  EBL:  minimal   BLOOD ADMINISTERED:none  DRAINS: none   LOCAL MEDICATIONS USED:  NONE  SPECIMEN:  Source of Specimen:  left eye vitreous and aqueous fluid  DISPOSITION OF SPECIMEN:  for culture  COUNTS:  YES  TOURNIQUET:  * No tourniquets in log *  DICTATION: .Note written in EPIC  PLAN OF CARE: Discharge to home after PACU  PATIENT DISPOSITION:  PACU - hemodynamically stable.   Delay start of Pharmacological VTE agent (>24hrs) due to surgical blood loss or risk of bleeding: not applicable

## 2017-02-21 NOTE — Anesthesia Preprocedure Evaluation (Addendum)
Anesthesia Evaluation  Patient identified by MRN, date of birth, ID band Patient awake    Reviewed: Allergy & Precautions, H&P , NPO status , Patient's Chart, lab work & pertinent test results  Airway Mallampati: II  TM Distance: >3 FB Neck ROM: Full    Dental no notable dental hx.    Pulmonary neg pulmonary ROS,    Pulmonary exam normal breath sounds clear to auscultation       Cardiovascular Exercise Tolerance: Good negative cardio ROS Normal cardiovascular exam Rhythm:regular Rate:Normal     Neuro/Psych  Headaches, PSYCHIATRIC DISORDERS Anxiety Depression TIAnegative psych ROS   GI/Hepatic negative GI ROS, Neg liver ROS, GERD  Controlled,  Endo/Other  negative endocrine ROS  Renal/GU Renal disease     Musculoskeletal  (+) Arthritis ,   Abdominal   Peds  Hematology negative hematology ROS (+)   Anesthesia Other Findings   Reproductive/Obstetrics negative OB ROS                            Anesthesia Physical  Anesthesia Plan  ASA: II and emergent  Anesthesia Plan: General   Post-op Pain Management:    Induction: Intravenous, Cricoid pressure planned and Rapid sequence  PONV Risk Score and Plan: 3 and Ondansetron, Dexamethasone and Treatment may vary due to age or medical condition  Airway Management Planned: Oral ETT  Additional Equipment:   Intra-op Plan:   Post-operative Plan: Extubation in OR  Informed Consent: I have reviewed the patients History and Physical, chart, labs and discussed the procedure including the risks, benefits and alternatives for the proposed anesthesia with the patient or authorized representative who has indicated his/her understanding and acceptance.   Dental advisory given  Plan Discussed with: CRNA  Anesthesia Plan Comments:       Anesthesia Quick Evaluation

## 2017-02-21 NOTE — Transfer of Care (Signed)
Immediate Anesthesia Transfer of Care Note  Patient: Jennifer Estrada  Procedure(s) Performed: PARS PLANA VITRECTOMY 25 GAUGE FOR ENDOPHTHALMITIS (Left Eye)  Patient Location: PACU  Anesthesia Type:General  Level of Consciousness: awake and alert   Airway & Oxygen Therapy: Patient Spontanous Breathing and Patient connected to nasal cannula oxygen  Post-op Assessment: Report given to RN and Post -op Vital signs reviewed and stable  Post vital signs: Reviewed and stable  Last Vitals:  Vitals:   02/21/17 1602 02/21/17 1946  BP: 118/61   Pulse: 67 (P) 84  Resp: 18 (P) 15  Temp: 36.8 C (P) 37.1 C  SpO2: 100% (P) 96%    Last Pain:  Vitals:   02/21/17 1602  TempSrc: Oral         Complications: No apparent anesthesia complications

## 2017-02-21 NOTE — Discharge Instructions (Signed)
POSTOPERATIVE INSTRUCTIONS  Your doctor [facility]  - Keep eye patched and shielded until seen by Dr. Vanessa BarbaraZamora 1030 AM tomorrow in clinic - Do not use drops until return - Keep head elevated while awake - Sleep with head elevated or on either side, avoid laying flat on back.    - No strenuous bending, stooping or lifting.  - You may not drive until further notice.  - If your doctor used a gas bubble in your eye during the procedure he will advise you on postoperative positioning. If you have a gas bubble you will be wearing a green bracelet that was applied in the operating room. The green bracelet should stay on as long as the gas bubble is in your eye. While the gas bubble is present you should not fly in an airplane. If you require general anesthesia while the gas bubble is present you must notify your anesthesiologist that an intraocular gas bubble is present so he can take the appropriate precautions.  - Tylenol or any other over-the-counter pain reliever can be used according to your doctor. If more pain medicine is required, your doctor will have a prescription for you.  - You may read, go up and down stairs, and watch television.     Rennis ChrisBrian [place] Rico, M.D., Ph.D.

## 2017-02-21 NOTE — Anesthesia Postprocedure Evaluation (Signed)
Anesthesia Post Note  Patient: Jennifer Estrada  Procedure(s) Performed: PARS PLANA VITRECTOMY 25 GAUGE FOR ENDOPHTHALMITIS (Left Eye)     Patient location during evaluation: PACU Anesthesia Type: General Level of consciousness: awake and alert Pain management: pain level controlled Vital Signs Assessment: post-procedure vital signs reviewed and stable Respiratory status: spontaneous breathing, nonlabored ventilation and respiratory function stable Cardiovascular status: blood pressure returned to baseline and stable Postop Assessment: no apparent nausea or vomiting Anesthetic complications: no    Last Vitals:  Vitals:   02/21/17 2015 02/21/17 2133  BP: (!) 148/90 128/71  Pulse:  69  Resp:  16  Temp:    SpO2:  97%    Last Pain:  Vitals:   02/21/17 2000  TempSrc:   PainSc: 5                  Desree Leap,W. EDMOND

## 2017-02-22 ENCOUNTER — Encounter (HOSPITAL_COMMUNITY): Payer: Self-pay | Admitting: Ophthalmology

## 2017-02-22 ENCOUNTER — Encounter (INDEPENDENT_AMBULATORY_CARE_PROVIDER_SITE_OTHER): Payer: Self-pay | Admitting: Ophthalmology

## 2017-02-22 ENCOUNTER — Ambulatory Visit (INDEPENDENT_AMBULATORY_CARE_PROVIDER_SITE_OTHER): Payer: Medicare Other | Admitting: Ophthalmology

## 2017-02-22 DIAGNOSIS — H44002 Unspecified purulent endophthalmitis, left eye: Secondary | ICD-10-CM

## 2017-02-22 DIAGNOSIS — Z961 Presence of intraocular lens: Secondary | ICD-10-CM

## 2017-02-22 NOTE — Progress Notes (Signed)
[facility] - Clinic Note  02/22/2017     CHIEF COMPLAINT Patient presents for Post-op Follow-up s/p phaco/PCIOL OS, 11.7.19, rg S/p PPV/AC washout/intravitreal vanc, ceftaz, cefepime OS, 11.11.19, bz  HISTORY OF PRESENT ILLNESS: Jennifer MillinerBrenda L Sobotta is a 72 y.o. female who presents to the clinic today for:   HPI    Post-op Follow-up    In left eye.  Discomfort includes foreign body sensation.  I, the attending physician,  performed the HPI with the patient and updated documentation appropriately.          Comments    POD1 s/p PPV/AC washout/intravitreal vanc, ceftaz, cefepime OS, 11.11.19, bz. Pt reports headache overnight. Eye felt okay. Slept with head elevated       Last edited by Rennis ChrisZamora, Shelonda Saxe, MD on 02/22/2017 11:22 AM. (History)      Referring physician: Merlene LaughterStoneking, Hal, MD [address] [place], [place] [address]  HISTORICAL INFORMATION:   Selected notes from the MEDICAL RECORD NUMBER Referred by Dr. Hortense Ramal. Groat for concern of endophthalmitis OS  LEE- 01.11.19  Ocular Hx- CE/toric IOL OS (01.07.19 - R. Groat) - VA 1 day PO OS 20/30, VA OS (01.11.19) HM PMH-     CURRENT MEDICATIONS: No current outpatient medications on file. (Ophthalmic Drugs)   No current facility-administered medications for this visit.  (Ophthalmic Drugs)   Current Outpatient Medications (Other)  Medication Sig  . Calcium Carbonate-Vitamin D (CALCIUM + D PO) Take 1 tablet by mouth daily.  . chlorhexidine (PERIDEX) 0.12 % solution Use as directed 15 mLs in the mouth or throat daily as needed (sores).   Marland Kitchen. dexlansoprazole (DEXILANT) 60 MG capsule Take 60 mg by mouth daily.  Marland Kitchen. docusate sodium 100 MG CAPS Take 100 mg by mouth 2 (two) times daily.  . fexofenadine (ALLEGRA) 30 MG tablet Take 30 mg by mouth as needed (allergies).   Marland Kitchen. PARoxetine (PAXIL) 20 MG tablet Take 20 mg by mouth every morning.  . propranolol ER (INDERAL LA) 80 MG 24 hr capsule Take 1 capsule (80 mg  total) by mouth daily. (Patient taking differently: Take 60 mg by mouth daily. )  . rosuvastatin (CRESTOR) 5 MG tablet Take 5 mg by mouth daily.   No current facility-administered medications for this visit.  (Other)      REVIEW OF SYSTEMS:    ALLERGIES Allergies  Allergen Reactions  . Atorvastatin Other (See Comments)    Feels anxous  . Cortisone Other (See Comments)    headache  . Oxycodone Other (See Comments)    confusion  . Paroxetine Other (See Comments)    anxiety  . Penicillins Other (See Comments)    Unknown per pt  . Pravastatin Other (See Comments)    muscle pain   . Singulair [Montelukast] Other (See Comments)    Chest pain  . Sulfa Antibiotics Nausea Only  . Aleve [Naproxen Sodium] Rash  . Augmentin [Amoxicillin-Pot Clavulanate] Rash  . Prednisone Other (See Comments)    headaches    PAST MEDICAL HISTORY Past Medical History:  Diagnosis Date  . Allergic rhinitis   . Chronic kidney disease   . Depression   . DJD (degenerative joint disease) of knee   . GERD (gastroesophageal reflux disease)   . Hyperlipidemia   . Migraine    Takes Propranolol  . Neuromuscular disorder (HCC)    Body tremors at times,h/o falling cast right arm  . Osteoporosis   . Tremors of nervous system   .  Vitamin D deficiency    Past Surgical History:  Procedure Laterality Date  . KNEE SURGERY    . Left Foot Surgery  20 years ago   20 years ago  . ORIF PATELLA Left 02/12/2014   Procedure: OPEN REDUCTION INTERNAL (ORIF) FIXATION LEFT PATELLA;  Surgeon: [name], MD;  Location: [facility];  Service: Orthopedics;  Laterality: Left;  . PARS PLANA VITRECTOMY Left 02/21/2017   Procedure: PARS PLANA VITRECTOMY 25 GAUGE FOR ENDOPHTHALMITIS;  Surgeon: [name], MD;  Location: [facility] OR;  Service: Ophthalmology;  Laterality: Left;  . WRIST FRACTURE SURGERY      FAMILY HISTORY No family history on file.  SOCIAL HISTORY Social History   Tobacco Use  . Smoking status: Never  Smoker  . Smokeless tobacco: Never Used  Substance Use Topics  . Alcohol use: No  . Drug use: No         OPHTHALMIC EXAM:  Base Eye Exam    Visual Acuity (Snellen - Linear)      Right Left   Dist [place] 20/30 HM       Tonometry (Tonopen, 11:23 AM)      Right Left   Pressure 12 4       Neuro/Psych    Oriented x3:  Yes   Mood/Affect:  Normal        Slit Lamp and Fundus Exam    Slit Lamp Exam      Right Left   Lids/Lashes Normal Normal   Conjunctiva/Sclera White and quiet sutures intact; SCH and Injection   Cornea Arcus Arcus, 4+ Edema, descemet folds; nylon sutures temporally and inferiorly; wounds seidel negative   Anterior Chamber Deep and quiet 4+ Cell, Fibrin, pupillary membrane   Iris Round and dilated Irregular pupil with temporal iridectomy   Lens 2-3+ Nuclear sclerosis, 2+ Cortical cataract PCIOL; fibrin membrane overlying lens   Vitreous Vitreous syneresis red reflex       Fundus Exam      Right Left   Disc sharp no view   C/D Ratio 0.5    Macula Flat, Retinal pigment epithelial mottling, No heme or edema    Vessels Normal    Periphery Attached           IMAGING AND PROCEDURES  Imaging and Procedures for 02/21/17  B-scan Ultrasound    Quality was good. Findings included vitreous opacities.   Notes Impression:  LEFT EYE: Dense vitreous opacities w/ hyperechoic signal on posterior retinal surface. Consistent with endophthalmitis, left eye. No mass or retinal detachment or retinal tears noted.                ASSESSMENT/PLAN:    ICD-10-CM   1. 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 Endophthalmitis OS-  - presented on POD4 s/p phaco/PCIOL on Monday, 11.7.19 - VA HM OS w/ 0.74mm hypopyon, fibrin membrane and irregular pupil, and dense vitreous opacities on b-scan ultrasound - now POD1 s/p PPV + AC washout + intravitreal vanc, ceftaz, and cefepime, OS, on 1.11.19  -  extensive AC and posterior debris / purulent material noted intra op  - repeat b-scan with improved but persistent vitreous opacities             - IOP low             - start   PF q1h OS  zymaxid QID OS                         Atropine BID OS                         PSO ung QID OS             - keep head elevated             - eye shield when sleeping             - post op drop and positioning instructions reviewed             - tylenol/ibuprofen for pain - discussed findings, guarded prognosis and possible need for further treatment - f/u Monday 0900  2. Pseudophakia OS - s/p phaco/PCIOL on Monday, 11.7.19   Ophthalmic Meds Ordered this visit:  No orders of the defined types were placed in this encounter.      Return in about 2 days (around 02/24/2017) for 9am; POV.  There are no Patient Instructions on file for this visit.   Explained the diagnoses, plan, and follow up with the patient and they expressed understanding.  Patient expressed understanding of the importance of proper follow up care.   This document serves as a record of services personally performed by Karie Chimera, MD, PhD. It was created on their behalf by Virgilio Belling, COA, a certified ophthalmic assistant. The creation of this record is the provider's dictation and/or activities during the visit.  Electronically signed by: Virgilio Belling, COA  02/22/17 11:30 AM    Karie Chimera, M.D., Ph.D. Diseases & Surgery of the Retina and Vitreous [facility] 02/22/17  I have reviewed the above documentation for accuracy and completeness, and I agree with the above. Karie Chimera, M.D., Ph.D. 02/22/17 11:30 AM   Abbreviations: M myopia (nearsighted); A astigmatism; H hyperopia (farsighted); P presbyopia; Mrx spectacle prescription;  CTL contact lenses; OD right eye; OS left eye; OU both eyes  XT exotropia; ET esotropia; PEK punctate epithelial  keratitis; PEE punctate epithelial erosions; DES dry eye syndrome; MGD meibomian gland dysfunction; ATs artificial tears; PFAT's preservative free artificial tears; NSC nuclear sclerotic cataract; PSC posterior subcapsular cataract; ERM epi-retinal membrane; PVD posterior vitreous detachment; RD retinal detachment; DM diabetes mellitus; DR diabetic retinopathy; NPDR non-proliferative diabetic retinopathy; PDR proliferative diabetic retinopathy; CSME clinically significant macular edema; DME diabetic macular edema; dbh dot blot hemorrhages; CWS cotton wool spot; POAG primary open angle glaucoma; C/D cup-to-disc ratio; HVF humphrey visual field; GVF goldmann visual field; OCT optical coherence tomography; IOP intraocular pressure; BRVO Branch retinal vein occlusion; CRVO central retinal vein occlusion; CRAO central retinal artery occlusion; BRAO branch retinal artery occlusion; RT retinal tear; SB scleral buckle; PPV pars plana vitrectomy; VH Vitreous hemorrhage; PRP panretinal laser photocoagulation; IVK intravitreal kenalog; VMT vitreomacular traction; MH Macular hole;  NVD neovascularization of the disc; NVE neovascularization elsewhere; AREDS age related eye disease study; ARMD age related macular degeneration; POAG primary open angle glaucoma; EBMD epithelial/anterior basement membrane dystrophy; ACIOL anterior chamber intraocular lens; IOL intraocular lens; PCIOL posterior chamber intraocular lens; Phaco/IOL phacoemulsification with intraocular lens placement; PRK photorefractive keratectomy; LASIK laser assisted in situ keratomileusis; HTN hypertension; DM diabetes mellitus; COPD chronic obstructive pulmonary disease

## 2017-02-24 ENCOUNTER — Encounter (INDEPENDENT_AMBULATORY_CARE_PROVIDER_SITE_OTHER): Payer: Self-pay | Admitting: Ophthalmology

## 2017-02-24 ENCOUNTER — Ambulatory Visit (INDEPENDENT_AMBULATORY_CARE_PROVIDER_SITE_OTHER): Payer: Medicare Other | Admitting: Ophthalmology

## 2017-02-24 DIAGNOSIS — H44002 Unspecified purulent endophthalmitis, left eye: Secondary | ICD-10-CM

## 2017-02-24 DIAGNOSIS — Z961 Presence of intraocular lens: Secondary | ICD-10-CM

## 2017-02-24 NOTE — Progress Notes (Signed)
[facility] - Clinic Note  02/24/2017     CHIEF COMPLAINT Patient presents for Post-op Follow-up s/p phaco/PCIOL OS, 11.7.19, rg S/p PPV/AC washout/intravitreal vanc, ceftaz, cefepime OS, 11.11.19, bz  HISTORY OF PRESENT ILLNESS: Jennifer MillinerBrenda L Vandergriff is a 72 y.o. female who presents to the clinic today for:   HPI    Post-op Follow-up    In left eye.  Discomfort includes pain.  Negative for itching, foreign body sensation, tearing, discharge, floaters and none.  Vision is stable.  I, the attending physician,  performed the HPI with the patient and updated documentation appropriately.          Comments    S/P phaco/PCIOL OS (11.07.19), rg; S/P PPV/AC washout/intravitreal vanc, ceftaz, cefepime OS (11.11.19), bz;  Pt states there has been no change since Saturday; Pt states she continues to have pain OS off and on; Pt reports she is still unable to "see anything"; Pt reports she is using PF q1hr OS, zymaxid OS QID, atropine OS BID, PSO ung OS QID as directed;        Last edited by Rennis ChrisZamora, Keala Drum, MD on 02/24/2017  9:29 AM. (History)      Referring physician: Merlene LaughterStoneking, Hal, MD [address] [place], [place] [address]  HISTORICAL INFORMATION:   Selected notes from the MEDICAL RECORD NUMBER Referred by Dr. Hortense Ramal. Groat for concern of endophthalmitis OS  LEE- 01.11.19  Ocular Hx- CE/toric IOL OS (01.07.19 - R. Groat) - VA 1 day PO OS 20/30, VA OS (01.11.19) HM PMH-     CURRENT MEDICATIONS: No current outpatient medications on file. (Ophthalmic Drugs)   No current facility-administered medications for this visit.  (Ophthalmic Drugs)   Current Outpatient Medications (Other)  Medication Sig  . Calcium Carbonate-Vitamin D (CALCIUM + D PO) Take 1 tablet by mouth daily.  . chlorhexidine (PERIDEX) 0.12 % solution Use as directed 15 mLs in the mouth or throat daily as needed (sores).   Marland Kitchen. dexlansoprazole (DEXILANT) 60 MG capsule Take 60 mg by mouth daily.  Marland Kitchen.  docusate sodium 100 MG CAPS Take 100 mg by mouth 2 (two) times daily.  . fexofenadine (ALLEGRA) 30 MG tablet Take 30 mg by mouth as needed (allergies).   Marland Kitchen. PARoxetine (PAXIL) 20 MG tablet Take 20 mg by mouth every morning.  . propranolol ER (INDERAL LA) 80 MG 24 hr capsule Take 1 capsule (80 mg total) by mouth daily. (Patient taking differently: Take 60 mg by mouth daily. )  . rosuvastatin (CRESTOR) 5 MG tablet Take 5 mg by mouth daily.   No current facility-administered medications for this visit.  (Other)      REVIEW OF SYSTEMS: ROS    Positive for: Eyes   Negative for: Constitutional, Gastrointestinal, Neurological, Skin, Genitourinary, Musculoskeletal, HENT, Endocrine, Cardiovascular, Respiratory, Psychiatric, Allergic/Imm, Heme/Lymph   Last edited by Concepcion ElkFabian, Meredith C on 02/24/2017  9:16 AM. (History)       ALLERGIES Allergies  Allergen Reactions  . Atorvastatin Other (See Comments)    Feels anxous  . Cortisone Other (See Comments)    headache  . Oxycodone Other (See Comments)    confusion  . Paroxetine Other (See Comments)    anxiety  . Penicillins Other (See Comments)    Unknown per pt  . Pravastatin Other (See Comments)    muscle pain   . Singulair [Montelukast] Other (See Comments)    Chest pain  . Sulfa Antibiotics Nausea Only  . Aleve [Naproxen Sodium] Rash  .  Augmentin [Amoxicillin-Pot Clavulanate] Rash  . Prednisone Other (See Comments)    headaches    PAST MEDICAL HISTORY Past Medical History:  Diagnosis Date  . Allergic rhinitis   . Chronic kidney disease   . Depression   . DJD (degenerative joint disease) of knee   . GERD (gastroesophageal reflux disease)   . Hyperlipidemia   . Migraine    Takes Propranolol  . Neuromuscular disorder (HCC)    Body tremors at times,h/o falling cast right arm  . Osteoporosis   . Tremors of nervous system   . Vitamin D deficiency    Past Surgical History:  Procedure Laterality Date  . KNEE SURGERY    . Left  Foot Surgery  20 years ago   20 years ago  . ORIF PATELLA Left 02/12/2014   Procedure: OPEN REDUCTION INTERNAL (ORIF) FIXATION LEFT PATELLA;  Surgeon: [name], MD;  Location: [facility];  Service: Orthopedics;  Laterality: Left;  . PARS PLANA VITRECTOMY Left 02/21/2017   Procedure: PARS PLANA VITRECTOMY 25 GAUGE FOR ENDOPHTHALMITIS;  Surgeon: [name], MD;  Location: [facility] OR;  Service: Ophthalmology;  Laterality: Left;  . WRIST FRACTURE SURGERY      FAMILY HISTORY History reviewed. No pertinent family history.  SOCIAL HISTORY Social History   Tobacco Use  . Smoking status: Never Smoker  . Smokeless tobacco: Never Used  Substance Use Topics  . Alcohol use: No  . Drug use: No         OPHTHALMIC EXAM:  Base Eye Exam    Visual Acuity (Snellen - Linear)      Right Left   Dist [place] 20/30 HM   Dist ph [place] NI NI       Tonometry (Tonopen, 9:28 AM)      Right Left   Pressure 11 21       Pupils      Dark Light Shape React APD   Right 4 3 Round Sluggish None   Left 7  Irregular  None       Extraocular Movement      Right Left    Full Full       Neuro/Psych    Oriented x3:  Yes   Mood/Affect:  Normal       Dilation    Left eye:  1.0% Mydriacyl, 2.5% Phenylephrine @ 9:28 AM        Slit Lamp and Fundus Exam    Slit Lamp Exam      Right Left   Lids/Lashes Normal Normal   Conjunctiva/Sclera White and quiet sutures intact; SCH and Injection   Cornea Arcus Arcus, 3-4+ Edema, descemet folds; nylon sutures temporally and inferiorly; wounds seidel negative   Anterior Chamber Deep and quiet 4+ Cell, Fibrin and pupillary membrane clearing; heme at 12 and 3 oclock   Iris Round and dilated Irregular pupil with temporal iridectomy; heme at 12 and 3   Lens 2-3+ Nuclear sclerosis, 2+ Cortical cataract PCIOL; fibrin membrane overlying lens improving   Vitreous Vitreous syneresis red reflex improving       Fundus Exam      Right Left   Disc sharp no view   C/D Ratio 0.5     Macula Flat, Retinal pigment epithelial mottling, No heme or edema    Vessels Normal    Periphery Attached           IMAGING AND PROCEDURES           ASSESSMENT/PLAN:    ICD-10-CM  1. 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 Endophthalmitis OS-  - presented on POD4 s/p phaco/PCIOL on Monday, 11.7.19 - VA HM OS w/ 0.44mm hypopyon, fibrin membrane and irregular pupil, and dense vitreous opacities on b-scan ultrasound - now POD3 s/p PPV + AC washout + intravitreal vanc, ceftaz, and cefepime, OS, on 1.11.19  - extensive AC and posterior debris / purulent material noted intra op  - gram stain and culture positive for coag negative staph  - b-scan with improved but persistent vitreous opacities             - IOP increased today to 21             - cont   PF q1h OS                         zymaxid QID OS                         Atropine BID OS                         PSO ung QID OS  - start  cosopt BID OS             - keep head elevated             - eye shield when sleeping             - post op drop and positioning instructions reviewed             - tylenol/ibuprofen for pain - discussed findings, guarded prognosis and possible need for further treatment - f/u Wednesday  2. Pseudophakia OS - s/p phaco/PCIOL on Monday, 11.7.19   Ophthalmic Meds Ordered this visit:  No orders of the defined types were placed in this encounter.      Return in about 2 days (around 02/26/2017).  There are no Patient Instructions on file for this visit.   Explained the diagnoses, plan, and follow up with the patient and they expressed understanding.  Patient expressed understanding of the importance of proper follow up care.   This document serves as a record of services personally performed by Karie Chimera, MD, PhD. It was created on their behalf by Virgilio Belling, COA, a certified ophthalmic assistant. The creation of this record is  the provider's dictation and/or activities during the visit.  Electronically signed by: Virgilio Belling, COA  02/24/17 9:51 AM    Karie Chimera, M.D., Ph.D. Diseases & Surgery of the Retina and Vitreous [facility] 02/24/17   I have reviewed the above documentation for accuracy and completeness, and I agree with the above. Karie Chimera, M.D., Ph.D. 02/24/17 9:51 AM    Abbreviations: M myopia (nearsighted); A astigmatism; H hyperopia (farsighted); P presbyopia; Mrx spectacle prescription;  CTL contact lenses; OD right eye; OS left eye; OU both eyes  XT exotropia; ET esotropia; PEK punctate epithelial keratitis; PEE punctate epithelial erosions; DES dry eye syndrome; MGD meibomian gland dysfunction; ATs artificial tears; PFAT's preservative free artificial tears; NSC nuclear sclerotic cataract; PSC posterior subcapsular cataract; ERM epi-retinal membrane; PVD posterior vitreous detachment; RD retinal detachment; DM diabetes mellitus; DR diabetic retinopathy; NPDR non-proliferative diabetic retinopathy; PDR proliferative diabetic retinopathy; CSME clinically significant macular edema; DME diabetic macular edema; dbh dot blot hemorrhages; CWS cotton  wool spot; POAG primary open angle glaucoma; C/D cup-to-disc ratio; HVF humphrey visual field; GVF goldmann visual field; OCT optical coherence tomography; IOP intraocular pressure; BRVO Branch retinal vein occlusion; CRVO central retinal vein occlusion; CRAO central retinal artery occlusion; BRAO branch retinal artery occlusion; RT retinal tear; SB scleral buckle; PPV pars plana vitrectomy; VH Vitreous hemorrhage; PRP panretinal laser photocoagulation; IVK intravitreal kenalog; VMT vitreomacular traction; MH Macular hole;  NVD neovascularization of the disc; NVE neovascularization elsewhere; AREDS age related eye disease study; ARMD age related macular degeneration; POAG primary open angle glaucoma; EBMD epithelial/anterior  basement membrane dystrophy; ACIOL anterior chamber intraocular lens; IOL intraocular lens; PCIOL posterior chamber intraocular lens; Phaco/IOL phacoemulsification with intraocular lens placement; PRK photorefractive keratectomy; LASIK laser assisted in situ keratomileusis; HTN hypertension; DM diabetes mellitus; COPD chronic obstructive pulmonary disease

## 2017-02-25 NOTE — Progress Notes (Signed)
[facility] - Clinic Note  02/26/2017     CHIEF COMPLAINT Patient presents for Post-op Follow-up s/p phaco/PCIOL OS, 11.7.19, rg S/p PPV/AC washout/intravitreal vanc, ceftaz, cefepime OS, 11.11.19, bz  HISTORY OF PRESENT ILLNESS: Jennifer Estrada is a 72 y.o. female who presents to the clinic today for:   HPI    Post-op Follow-up    In left eye.  Discomfort includes pain.  Negative for itching, foreign body sensation, tearing, discharge, floaters and none.  Vision is stable.  I, the attending physician,  performed the HPI with the patient and updated documentation appropriately.          Comments    S/P PPV/AC washout/intravitreal vanc, ceftaz, cefepime OS (11.11.19);  Pt states OS is painful at night, pt reports she feels that the steriod is giving her a headache; Pt reports a swollen feeling "behind" OS; Pt reports there has been no change in OS VA since last visit;        Last edited by [name], MD on 02/26/2017 11:39 AM. (History)      Referring physician: Merlene Laughter, MD [address] [place], [place] [address]  HISTORICAL INFORMATION:   Selected notes from the MEDICAL RECORD NUMBER Referred by Dr. Hortense Ramal for concern of endophthalmitis OS  LEE- 01.11.19  Ocular Hx- CE/toric IOL OS (01.07.19 - R. Groat) - VA 1 day PO OS 20/30, VA OS (01.11.19) HM PMH-     CURRENT MEDICATIONS: Current Outpatient Medications (Ophthalmic Drugs)  Medication Sig  . bacitracin-polymyxin b (POLYSPORIN) ophthalmic ointment Place into the left eye 4 (four) times daily. Place a 1/2 inch ribbon of ointment into the lower eyelid.  . prednisoLONE acetate (PRED FORTE) 1 % ophthalmic suspension Place 1 drop into the right eye every hour.   No current facility-administered medications for this visit.  (Ophthalmic Drugs)   Current Outpatient Medications (Other)  Medication Sig  . Calcium Carbonate-Vitamin D (CALCIUM + D PO) Take 1 tablet by mouth daily.   . chlorhexidine (PERIDEX) 0.12 % solution Use as directed 15 mLs in the mouth or throat daily as needed (sores).   Marland Kitchen dexlansoprazole (DEXILANT) 60 MG capsule Take 60 mg by mouth daily.  Marland Kitchen docusate sodium 100 MG CAPS Take 100 mg by mouth 2 (two) times daily.  . fexofenadine (ALLEGRA) 30 MG tablet Take 30 mg by mouth as needed (allergies).   Marland Kitchen PARoxetine (PAXIL) 20 MG tablet Take 20 mg by mouth every morning.  . propranolol ER (INDERAL LA) 80 MG 24 hr capsule Take 1 capsule (80 mg total) by mouth daily. (Patient taking differently: Take 60 mg by mouth daily. )  . rosuvastatin (CRESTOR) 5 MG tablet Take 5 mg by mouth daily.   No current facility-administered medications for this visit.  (Other)      REVIEW OF SYSTEMS: ROS    Positive for: Musculoskeletal, Eyes   Negative for: Constitutional, Gastrointestinal, Neurological, Skin, Genitourinary, HENT, Endocrine, Cardiovascular, Respiratory, Psychiatric, Allergic/Imm, Heme/Lymph   Last edited by Concepcion Elk on 02/26/2017  9:46 AM. (History)       ALLERGIES Allergies  Allergen Reactions  . Atorvastatin Other (See Comments)    Feels anxous  . Cortisone Other (See Comments)    headache  . Oxycodone Other (See Comments)    confusion  . Paroxetine Other (See Comments)    anxiety  . Penicillins Other (See Comments)    Unknown per pt  . Pravastatin Other (See Comments)  muscle pain   . Singulair [Montelukast] Other (See Comments)    Chest pain  . Sulfa Antibiotics Nausea Only  . Aleve [Naproxen Sodium] Rash  . Augmentin [Amoxicillin-Pot Clavulanate] Rash  . Prednisone Other (See Comments)    headaches    PAST MEDICAL HISTORY Past Medical History:  Diagnosis Date  . Allergic rhinitis   . Chronic kidney disease   . Depression   . DJD (degenerative joint disease) of knee   . GERD (gastroesophageal reflux disease)   . Hyperlipidemia   . Migraine    Takes Propranolol  . Neuromuscular disorder (HCC)    Body tremors at  times,h/o falling cast right arm  . Osteoporosis   . Tremors of nervous system   . Vitamin D deficiency    Past Surgical History:  Procedure Laterality Date  . KNEE SURGERY    . Left Foot Surgery  20 years ago   20 years ago  . ORIF PATELLA Left 02/12/2014   Procedure: OPEN REDUCTION INTERNAL (ORIF) FIXATION LEFT PATELLA;  Surgeon: [name], MD;  Location: [facility];  Service: Orthopedics;  Laterality: Left;  . PARS PLANA VITRECTOMY Left 02/21/2017   Procedure: PARS PLANA VITRECTOMY 25 GAUGE FOR ENDOPHTHALMITIS;  Surgeon: [name], MD;  Location: [facility] OR;  Service: Ophthalmology;  Laterality: Left;  . WRIST FRACTURE SURGERY      FAMILY HISTORY History reviewed. No pertinent family history.  SOCIAL HISTORY Social History   Tobacco Use  . Smoking status: Never Smoker  . Smokeless tobacco: Never Used  Substance Use Topics  . Alcohol use: No  . Drug use: No         OPHTHALMIC EXAM:  Base Eye Exam    Visual Acuity (Snellen - Linear)      Right Left   Dist [place] 20/30 HM   Dist ph [place] NI NI       Tonometry (Tonopen, 9:51 AM)      Right Left   Pressure 14 21       Pupils      Dark Light Shape React APD   Right 4 3 Round Slow None   Left 6  Round NR None       Neuro/Psych    Oriented x3:  Yes   Mood/Affect:  Normal       Dilation    Left eye:  1.0% Mydriacyl, 2.5% Phenylephrine @ 9:51 AM        Slit Lamp and Fundus Exam    Slit Lamp Exam      Right Left   Lids/Lashes Normal Normal   Conjunctiva/Sclera White and quiet sutures intact; SCH and Injection   Cornea Arcus Arcus, 3-4+ Edema, descemet folds; nylon sutures temporally and inferiorly; wounds seidel negative   Anterior Chamber Deep and quiet 4+ Cell, Fibrin and pupillary membrane clearing; clotted heme at 12 and 3 oclock   Iris Round and dilated Irregular pupil with temporal iridectomy; clotted heme at 12 and 3   Lens 2-3+ Nuclear sclerosis, 2+ Cortical cataract PCIOL displaced superiorly; fibrin  membrane overlying lens improving   Vitreous Vitreous syneresis red reflex improving       Fundus Exam      Right Left   Disc sharp hazy view   C/D Ratio 0.5    Macula Flat, Retinal pigment epithelial mottling, No heme or edema heme overlying   Vessels Normal    Periphery Attached red reflex          IMAGING AND PROCEDURES  ASSESSMENT/PLAN:    ICD-10-CM   1. 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 Endophthalmitis OS-  - presented on POD4 s/p phaco/PCIOL on Monday, 11.7.19 - VA HM OS w/ 0.318mm hypopyon, fibrin membrane and irregular pupil, and dense vitreous opacities on b-scan ultrasound - now POD5 s/p PPV + AC washout + intravitreal vanc, ceftaz, and cefepime, OS, on 1.11.19  - extensive AC and posterior debris / purulent material noted intra op  - gram stain and culture positive for coag negative staph  - b-scan with improved but persistent vitreous opacities             - IOP 21 OS today             - cont   PF q1h OS                         zymaxid QID OS                         Atropine BID OS                         PSO ung QID OS   cosopt BID OS             - keep head elevated             - eye shield when sleeping             - post op drop and positioning instructions reviewed             - tylenol/ibuprofen for pain - discussed findings, guarded prognosis and possible need for further treatment - f/u Friday  2. Pseudophakia OS - s/p phaco/PCIOL on Monday, 11.7.19   Ophthalmic Meds Ordered this visit:  Meds ordered this encounter  Medications  . prednisoLONE acetate (PRED FORTE) 1 % ophthalmic suspension    Sig: Place 1 drop into the right eye every hour.    Dispense:  15 mL    Refill:  0  . bacitracin-polymyxin b (POLYSPORIN) ophthalmic ointment    Sig: Place into the left eye 4 (four) times daily. Place a 1/2 inch ribbon of ointment into the lower eyelid.     Dispense:  3.5 g    Refill:  0       Return in about 2 days (around 02/28/2017) for POV.  There are no Patient Instructions on file for this visit.   Explained the diagnoses, plan, and follow up with the patient and they expressed understanding.  Patient expressed understanding of the importance of proper follow up care.   This document serves as a record of services personally performed by Karie ChimeraBrian G. Adaleena Mooers, MD, PhD. It was created on their behalf by Virgilio BellingMeredith Fabian, COA, a certified ophthalmic assistant. The creation of this record is the provider's dictation and/or activities during the visit.  Electronically signed by: Virgilio BellingMeredith Fabian, COA  02/26/17 11:40 AM     Karie ChimeraBrian G. Dreshaun Stene, M.D., Ph.D. Diseases & Surgery of the Retina and Vitreous [facility] 02/26/17   I have reviewed the above documentation for accuracy and completeness, and I agree with the above. Karie ChimeraBrian G. Zyion Doxtater, M.D., Ph.D. 02/26/17 11:42 AM     Abbreviations: M myopia (nearsighted); A astigmatism; H hyperopia (farsighted); P presbyopia; Mrx spectacle  prescription;  CTL contact lenses; OD right eye; OS left eye; OU both eyes  XT exotropia; ET esotropia; PEK punctate epithelial keratitis; PEE punctate epithelial erosions; DES dry eye syndrome; MGD meibomian gland dysfunction; ATs artificial tears; PFAT's preservative free artificial tears; NSC nuclear sclerotic cataract; PSC posterior subcapsular cataract; ERM epi-retinal membrane; PVD posterior vitreous detachment; RD retinal detachment; DM diabetes mellitus; DR diabetic retinopathy; NPDR non-proliferative diabetic retinopathy; PDR proliferative diabetic retinopathy; CSME clinically significant macular edema; DME diabetic macular edema; dbh dot blot hemorrhages; CWS cotton wool spot; POAG primary open angle glaucoma; C/D cup-to-disc ratio; HVF humphrey visual field; GVF goldmann visual field; OCT optical coherence tomography; IOP intraocular pressure;  BRVO Branch retinal vein occlusion; CRVO central retinal vein occlusion; CRAO central retinal artery occlusion; BRAO branch retinal artery occlusion; RT retinal tear; SB scleral buckle; PPV pars plana vitrectomy; VH Vitreous hemorrhage; PRP panretinal laser photocoagulation; IVK intravitreal kenalog; VMT vitreomacular traction; MH Macular hole;  NVD neovascularization of the disc; NVE neovascularization elsewhere; AREDS age related eye disease study; ARMD age related macular degeneration; POAG primary open angle glaucoma; EBMD epithelial/anterior basement membrane dystrophy; ACIOL anterior chamber intraocular lens; IOL intraocular lens; PCIOL posterior chamber intraocular lens; Phaco/IOL phacoemulsification with intraocular lens placement; PRK photorefractive keratectomy; LASIK laser assisted in situ keratomileusis; HTN hypertension; DM diabetes mellitus; COPD chronic obstructive pulmonary disease

## 2017-02-26 ENCOUNTER — Encounter (INDEPENDENT_AMBULATORY_CARE_PROVIDER_SITE_OTHER): Payer: Self-pay | Admitting: Ophthalmology

## 2017-02-26 ENCOUNTER — Ambulatory Visit (INDEPENDENT_AMBULATORY_CARE_PROVIDER_SITE_OTHER): Payer: Medicare Other | Admitting: Ophthalmology

## 2017-02-26 DIAGNOSIS — H44002 Unspecified purulent endophthalmitis, left eye: Secondary | ICD-10-CM

## 2017-02-26 DIAGNOSIS — Z961 Presence of intraocular lens: Secondary | ICD-10-CM

## 2017-02-26 LAB — AEROBIC/ANAEROBIC CULTURE (SURGICAL/DEEP WOUND)

## 2017-02-26 LAB — AEROBIC/ANAEROBIC CULTURE W GRAM STAIN (SURGICAL/DEEP WOUND): Culture: NO GROWTH

## 2017-02-26 MED ORDER — PREDNISOLONE ACETATE 1 % OP SUSP: 1.0000 [drp] | OPHTHALMIC | 0 refills | Status: DC

## 2017-02-26 MED ORDER — BACITRACIN-POLYMYXIN B 500-10000 UNIT/GM OP OINT: TOPICAL_OINTMENT | Freq: Four times a day (QID) | OPHTHALMIC | 0 refills | Status: DC

## 2017-02-26 MED FILL — Vancomycin HCl For IV Soln 1 GM (Base Equivalent): INTRAOCULAR | Qty: 0.1 | Status: AC

## 2017-02-26 MED FILL — Ceftazidime For Inj 1 GM: INTRAVITREAL | Qty: 0.1 | Status: AC

## 2017-02-27 NOTE — Progress Notes (Signed)
[facility] - Clinic Note  02/28/2017     CHIEF COMPLAINT Patient presents for Post-op Follow-up s/p phaco/PCIOL OS, 11.7.19, rg S/p PPV/AC washout/intravitreal vanc, ceftaz, cefepime OS, 11.11.19, bz  HISTORY OF PRESENT ILLNESS: JENNESIS RAMASWAMY is a 72 y.o. female who presents to the clinic today for:   HPI    Post-op Follow-up    In left eye.  Discomfort includes itching and tearing.  Vision is improved.  I, the attending physician,  performed the HPI with the patient and updated documentation appropriately.          Comments    Pt presents today for F/U for endophthalmitis, pt states she has used to all gtts this morning, pt states vision is stable, denies flashes, floaters, or wavy vision, pt states OS is slightly painful and has been itching,       Last edited by [name], MD on 03/01/2017 12:14 AM. (History)      Referring physician: Merlene Laughter, MD [address] [place], [place] [address]  HISTORICAL INFORMATION:   Selected notes from the MEDICAL RECORD NUMBER Referred by Dr. Hortense Ramal for concern of endophthalmitis OS  LEE- 01.11.19  Ocular Hx- CE/toric IOL OS (01.07.19 - R. Groat) - VA 1 day PO OS 20/30, VA OS (01.11.19) HM PMH-     CURRENT MEDICATIONS: Current Outpatient Medications (Ophthalmic Drugs)  Medication Sig  . bacitracin-polymyxin b (POLYSPORIN) ophthalmic ointment Place into the left eye 4 (four) times daily. Place a 1/2 inch ribbon of ointment into the lower eyelid.  . prednisoLONE acetate (PRED FORTE) 1 % ophthalmic suspension Place 1 drop into the right eye every hour.   No current facility-administered medications for this visit.  (Ophthalmic Drugs)   Current Outpatient Medications (Other)  Medication Sig  . Calcium Carbonate-Vitamin D (CALCIUM + D PO) Take 1 tablet by mouth daily.  . chlorhexidine (PERIDEX) 0.12 % solution Use as directed 15 mLs in the mouth or throat daily as needed (sores).   Marland Kitchen  dexlansoprazole (DEXILANT) 60 MG capsule Take 60 mg by mouth daily.  Marland Kitchen docusate sodium 100 MG CAPS Take 100 mg by mouth 2 (two) times daily.  . fexofenadine (ALLEGRA) 30 MG tablet Take 30 mg by mouth as needed (allergies).   Marland Kitchen PARoxetine (PAXIL) 20 MG tablet Take 20 mg by mouth every morning.  . propranolol ER (INDERAL LA) 80 MG 24 hr capsule Take 1 capsule (80 mg total) by mouth daily. (Patient taking differently: Take 60 mg by mouth daily. )  . rosuvastatin (CRESTOR) 5 MG tablet Take 5 mg by mouth daily.   No current facility-administered medications for this visit.  (Other)      REVIEW OF SYSTEMS: ROS    Positive for: Eyes   Negative for: Constitutional, Gastrointestinal, Neurological, Skin, Genitourinary, Musculoskeletal, HENT, Endocrine, Cardiovascular, Respiratory, Psychiatric, Allergic/Imm, Heme/Lymph   Last edited by Posey Boyer, COT on 02/28/2017  9:22 AM. (History)       ALLERGIES Allergies  Allergen Reactions  . Atorvastatin Other (See Comments)    Feels anxous  . Cortisone Other (See Comments)    headache  . Oxycodone Other (See Comments)    confusion  . Paroxetine Other (See Comments)    anxiety  . Penicillins Other (See Comments)    Unknown per pt  . Pravastatin Other (See Comments)    muscle pain   . Singulair [Montelukast] Other (See Comments)    Chest pain  . Sulfa  Antibiotics Nausea Only  . Aleve [Naproxen Sodium] Rash  . Augmentin [Amoxicillin-Pot Clavulanate] Rash  . Prednisone Other (See Comments)    headaches    PAST MEDICAL HISTORY Past Medical History:  Diagnosis Date  . Allergic rhinitis   . Chronic kidney disease   . Depression   . DJD (degenerative joint disease) of knee   . GERD (gastroesophageal reflux disease)   . Hyperlipidemia   . Migraine    Takes Propranolol  . Neuromuscular disorder (HCC)    Body tremors at times,h/o falling cast right arm  . Osteoporosis   . Tremors of nervous system   . Vitamin D deficiency    Past  Surgical History:  Procedure Laterality Date  . KNEE SURGERY    . Left Foot Surgery  20 years ago   20 years ago  . ORIF PATELLA Left 02/12/2014   Procedure: OPEN REDUCTION INTERNAL (ORIF) FIXATION LEFT PATELLA;  Surgeon: [name], MD;  Location: [facility];  Service: Orthopedics;  Laterality: Left;  . PARS PLANA VITRECTOMY Left 02/21/2017   Procedure: PARS PLANA VITRECTOMY 25 GAUGE FOR ENDOPHTHALMITIS;  Surgeon: [name], MD;  Location: [facility] OR;  Service: Ophthalmology;  Laterality: Left;  . WRIST FRACTURE SURGERY      FAMILY HISTORY History reviewed. No pertinent family history.  SOCIAL HISTORY Social History   Tobacco Use  . Smoking status: Never Smoker  . Smokeless tobacco: Never Used  Substance Use Topics  . Alcohol use: No  . Drug use: No         OPHTHALMIC EXAM:  Base Eye Exam    Visual Acuity (Snellen - Linear)      Right Left   Dist cc 20/40 -1 20/100 -1   Dist ph cc 20/30 -2 NI   Correction:  Glasses       Tonometry (Tonopen, 9:29 AM)      Right Left   Pressure 10 21       Tonometry #2 (Tonopen, 9:30 AM)      Right Left   Pressure  17       Neuro/Psych    Oriented x3:  Yes   Mood/Affect:  Normal       Dilation    Left eye:  1.0% Mydriacyl, 2.5% Phenylephrine @ 9:30 AM        Slit Lamp and Fundus Exam    Slit Lamp Exam      Right Left   Lids/Lashes Normal Normal   Conjunctiva/Sclera White and quiet sutures intact; SCH and Injection   Cornea Arcus Arcus, 2-3+ Edema, descemet folds; nylon sutures temporally and inferiorly; wounds seidel negative, 2+ Punctate epithelial erosions   Anterior Chamber Deep and quiet 4+ Cell, Fibrin and pupillary membrane clearing; clotted heme at 12 and 3 oclock   Iris Round and dilated Irregular pupil with temporal iridectomy/iridodialysis; clotted heme at 12 and 3 shrinking   Lens 2-3+ Nuclear sclerosis, 2+ Cortical cataract PCIOL slightly displaced superiorly; fibrin membrane overlying lens improving    Vitreous Vitreous syneresis red reflex improving, Vitreous debris and hemorrhage clearing and settling inferiorly       Fundus Exam      Right Left   Disc  hazy view   C/D Ratio 0.5    Macula  heme overlying   Vessels  Hazy view, Vascular attenuation   Periphery  red reflex          IMAGING AND PROCEDURES           ASSESSMENT/PLAN:  ICD-10-CM   1. 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 Endophthalmitis OS-  - presented on POD4 s/p phaco/PCIOL on Monday, 11.7.19 - VA HM OS w/ 0.38mm hypopyon, fibrin membrane and irregular pupil, and dense vitreous opacities on b-scan ultrasound - now POD7 s/p PPV + AC washout + intravitreal vanc, ceftaz, and cefepime, OS, on 1.11.19  - extensive AC and posterior debris / purulent material noted intra op  - gram stain and culture positive for coag negative staph  - b-scan with improved but persistent vitreous opacities             - IOP 17 OS today             - cont   PF q1h OS                         zymaxid QID OS -- can stop when bottle runs out                         Atropine BID OS                         PSO ung QID OS   cosopt BID OS              - keep head elevated             - eye shield when sleeping             - post op drop and positioning instructions reviewed             - tylenol/ibuprofen for pain - discussed findings, guarded prognosis and possible need for further treatment - f/u Tuesday   2. Pseudophakia OS - s/p phaco/PCIOL on Monday, 11.7.19   Ophthalmic Meds Ordered this visit:  No orders of the defined types were placed in this encounter.      Return in about 4 days (around 03/04/2017) for POV.  There are no Patient Instructions on file for this visit.   Explained the diagnoses, plan, and follow up with the patient and they expressed understanding.  Patient expressed understanding of the importance of proper follow up care.   This document serves as a  record of services personally performed by Karie Chimera, MD, PhD. It was created on their behalf by Virgilio Belling, COA, a certified ophthalmic assistant. The creation of this record is the provider's dictation and/or activities during the visit.  Electronically signed by: Virgilio Belling, COA  03/01/17 12:17 AM    Karie Chimera, M.D., Ph.D. Diseases & Surgery of the Retina and Vitreous [facility] 03/01/17   I have reviewed the above documentation for accuracy and completeness, and I agree with the above. Karie Chimera, M.D., Ph.D. 03/01/17 12:19 AM    Abbreviations: M myopia (nearsighted); A astigmatism; H hyperopia (farsighted); P presbyopia; Mrx spectacle prescription;  CTL contact lenses; OD right eye; OS left eye; OU both eyes  XT exotropia; ET esotropia; PEK punctate epithelial keratitis; PEE punctate epithelial erosions; DES dry eye syndrome; MGD meibomian gland dysfunction; ATs artificial tears; PFAT's preservative free artificial tears; NSC nuclear sclerotic cataract; PSC posterior subcapsular cataract; ERM epi-retinal membrane; PVD posterior vitreous detachment; RD retinal detachment; DM diabetes mellitus; DR diabetic retinopathy; NPDR non-proliferative diabetic retinopathy; PDR proliferative diabetic retinopathy; CSME clinically significant macular  edema; DME diabetic macular edema; dbh dot blot hemorrhages; CWS cotton wool spot; POAG primary open angle glaucoma; C/D cup-to-disc ratio; HVF humphrey visual field; GVF goldmann visual field; OCT optical coherence tomography; IOP intraocular pressure; BRVO Branch retinal vein occlusion; CRVO central retinal vein occlusion; CRAO central retinal artery occlusion; BRAO branch retinal artery occlusion; RT retinal tear; SB scleral buckle; PPV pars plana vitrectomy; VH Vitreous hemorrhage; PRP panretinal laser photocoagulation; IVK intravitreal kenalog; VMT vitreomacular traction; MH Macular hole;  NVD  neovascularization of the disc; NVE neovascularization elsewhere; AREDS age related eye disease study; ARMD age related macular degeneration; POAG primary open angle glaucoma; EBMD epithelial/anterior basement membrane dystrophy; ACIOL anterior chamber intraocular lens; IOL intraocular lens; PCIOL posterior chamber intraocular lens; Phaco/IOL phacoemulsification with intraocular lens placement; PRK photorefractive keratectomy; LASIK laser assisted in situ keratomileusis; HTN hypertension; DM diabetes mellitus; COPD chronic obstructive pulmonary disease

## 2017-02-28 ENCOUNTER — Encounter (INDEPENDENT_AMBULATORY_CARE_PROVIDER_SITE_OTHER): Payer: Self-pay | Admitting: Ophthalmology

## 2017-02-28 ENCOUNTER — Ambulatory Visit (INDEPENDENT_AMBULATORY_CARE_PROVIDER_SITE_OTHER): Payer: Medicare Other | Admitting: Ophthalmology

## 2017-02-28 DIAGNOSIS — Z961 Presence of intraocular lens: Secondary | ICD-10-CM

## 2017-02-28 DIAGNOSIS — H44002 Unspecified purulent endophthalmitis, left eye: Secondary | ICD-10-CM

## 2017-03-01 ENCOUNTER — Encounter (INDEPENDENT_AMBULATORY_CARE_PROVIDER_SITE_OTHER): Payer: Self-pay | Admitting: Ophthalmology

## 2017-03-03 NOTE — Progress Notes (Signed)
[facility] - Clinic Note  03/04/2017     CHIEF COMPLAINT Patient presents for Post-op Follow-up s/p phaco/PCIOL OS, 11.7.19, rg S/p PPV/AC washout/intravitreal vanc, ceftaz, cefepime OS, 11.11.19, bz  HISTORY OF PRESENT ILLNESS: Jennifer Estrada is a 72 y.o. female who presents to the clinic today for:   HPI    Post-op Follow-up    In left eye.  Discomfort includes floaters.  Negative for pain, itching, foreign body sensation, tearing, discharge and none.  Vision is stable.  I, the attending physician,  performed the HPI with the patient and updated documentation appropriately.          Comments    S/P PPV + AC washout + intravitreal vanc,ceftaz, and cefepime OS (01.11.19); Pt reports she is seeing "a lot of debris" like she is "in the woods looking around"; Pt states the "debris is worse after gtts"; Pt states she has not noted any improvement in OS VA, states she is "still cant see"; Pt reports OS continues to be uncomfortable; pt states she continues to have headaches at the end of the day;        Last edited by [name], MD on 03/04/2017  1:09 PM. (History)      Referring physician: Merlene Laughter, MD [address] [place], [place] [address]  HISTORICAL INFORMATION:   Selected notes from the MEDICAL RECORD NUMBER Referred by Dr. Hortense Ramal for concern of endophthalmitis OS  LEE- 01.11.19  Ocular Hx- CE/toric IOL OS (01.07.19 - R. Groat) - VA 1 day PO OS 20/30, VA OS (01.11.19) HM PMH-     CURRENT MEDICATIONS: Current Outpatient Medications (Ophthalmic Drugs)  Medication Sig  . bacitracin-polymyxin b (POLYSPORIN) ophthalmic ointment Place into the left eye 4 (four) times daily. Place a 1/2 inch ribbon of ointment into the lower eyelid.  . prednisoLONE acetate (PRED FORTE) 1 % ophthalmic suspension Place 1 drop into the right eye every hour.   No current facility-administered medications for this visit.  (Ophthalmic Drugs)    Current Outpatient Medications (Other)  Medication Sig  . Calcium Carbonate-Vitamin D (CALCIUM + D PO) Take 1 tablet by mouth daily.  . chlorhexidine (PERIDEX) 0.12 % solution Use as directed 15 mLs in the mouth or throat daily as needed (sores).   Marland Kitchen dexlansoprazole (DEXILANT) 60 MG capsule Take 60 mg by mouth daily.  Marland Kitchen docusate sodium 100 MG CAPS Take 100 mg by mouth 2 (two) times daily.  . fexofenadine (ALLEGRA) 30 MG tablet Take 30 mg by mouth as needed (allergies).   Marland Kitchen PARoxetine (PAXIL) 20 MG tablet Take 20 mg by mouth every morning.  . propranolol ER (INDERAL LA) 80 MG 24 hr capsule Take 1 capsule (80 mg total) by mouth daily. (Patient taking differently: Take 60 mg by mouth daily. )  . rosuvastatin (CRESTOR) 5 MG tablet Take 5 mg by mouth daily.   No current facility-administered medications for this visit.  (Other)      REVIEW OF SYSTEMS: ROS    Positive for: Eyes   Negative for: Constitutional, Gastrointestinal, Neurological, Skin, Genitourinary, Musculoskeletal, HENT, Endocrine, Cardiovascular, Respiratory, Psychiatric, Allergic/Imm, Heme/Lymph   Last edited by Concepcion Elk on 03/04/2017  9:39 AM. (History)       ALLERGIES Allergies  Allergen Reactions  . Atorvastatin Other (See Comments)    Feels anxous  . Cortisone Other (See Comments)    headache  . Oxycodone Other (See Comments)    confusion  .  Paroxetine Other (See Comments)    anxiety  . Penicillins Other (See Comments)    Unknown per pt  . Pravastatin Other (See Comments)    muscle pain   . Singulair [Montelukast] Other (See Comments)    Chest pain  . Sulfa Antibiotics Nausea Only  . Aleve [Naproxen Sodium] Rash  . Augmentin [Amoxicillin-Pot Clavulanate] Rash  . Prednisone Other (See Comments)    headaches    PAST MEDICAL HISTORY Past Medical History:  Diagnosis Date  . Allergic rhinitis   . Chronic kidney disease   . Depression   . DJD (degenerative joint disease) of knee   . GERD  (gastroesophageal reflux disease)   . Hyperlipidemia   . Migraine    Takes Propranolol  . Neuromuscular disorder (HCC)    Body tremors at times,h/o falling cast right arm  . Osteoporosis   . Tremors of nervous system   . Vitamin D deficiency    Past Surgical History:  Procedure Laterality Date  . KNEE SURGERY    . Left Foot Surgery  20 years ago   20 years ago  . ORIF PATELLA Left 02/12/2014   Procedure: OPEN REDUCTION INTERNAL (ORIF) FIXATION LEFT PATELLA;  Surgeon: [name], MD;  Location: [facility];  Service: Orthopedics;  Laterality: Left;  . PARS PLANA VITRECTOMY Left 02/21/2017   Procedure: PARS PLANA VITRECTOMY 25 GAUGE FOR ENDOPHTHALMITIS;  Surgeon: [name], MD;  Location: [facility] OR;  Service: Ophthalmology;  Laterality: Left;  . WRIST FRACTURE SURGERY      FAMILY HISTORY History reviewed. No pertinent family history.  SOCIAL HISTORY Social History   Tobacco Use  . Smoking status: Never Smoker  . Smokeless tobacco: Never Used  Substance Use Topics  . Alcohol use: No  . Drug use: No         OPHTHALMIC EXAM:  Base Eye Exam    Visual Acuity (Snellen - Linear)      Right Left   Dist [place] 20/40 HM @ face   Dist cc 20/40 -1 HM @ face   Dist ph [place] 20/30 NI   Dist ph cc 20/30 NI   Correction:  Glasses  Pt states she "only sees debris, like she's in the woods"        Tonometry (Tonopen, 9:51 AM)      Right Left   Pressure 12 15       Pupils      Dark Light Shape React APD   Right 4 3 Round Brisk None   Left 6  Irregular NR None  Difficult to obtain OS;        Neuro/Psych    Oriented x3:  Yes   Mood/Affect:  Normal       Dilation    Left eye:  1.0% Mydriacyl, 2.5% Phenylephrine @ 9:51 AM        Slit Lamp and Fundus Exam    Slit Lamp Exam      Right Left   Lids/Lashes Normal Normal   Conjunctiva/Sclera White and quiet sutures intact; SCH and Injection   Cornea Arcus Arcus, 2-3+ Edema, 1+ descemet folds; nylon sutures temporally and  inferiorly; wounds seidel negative, 3-4+ Punctate epithelial erosions with irregular epithelium, dry tear film   Anterior Chamber Deep and quiet 3-4+ Cell and RBC, Fibrin and pupillary membrane clearing; clotted hemes at 12 and 3 oclock shrinking   Iris Round and dilated Irregular pupil with temporal iridectomy/iridodialysis; clotted heme at 12 and 3 shrinking   Lens  2-3+ Nuclear sclerosis, 2+ Cortical cataract PCIOL slightly displaced superiorly; fibrin membrane overlying lens improving   Vitreous Vitreous syneresis red reflex improving, Vitreous debris and hemorrhage clearing and settling inferiorly, anterior vitreous debris and RBCs, diffuse VH       Fundus Exam      Right Left   Disc  hazy view   C/D Ratio 0.5    Macula  heme overlying   Vessels  Hazy view, Vascular attenuation   Periphery  red reflex          IMAGING AND PROCEDURES           ASSESSMENT/PLAN:    ICD-10-CM   1. 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 Endophthalmitis OS-  - presented on POD4 s/p phaco/PCIOL on Monday, 11.7.19 - VA HM OS w/ 0.69mm hypopyon, fibrin membrane and irregular pupil, and dense vitreous opacities on b-scan ultrasound - now POW1 s/p PPV + AC washout + intravitreal vanc, ceftaz, and cefepime, OS, on 1.11.19  - extensive AC and posterior debris / purulent material noted intra op  - gram stain and culture positive for coag negative staph  - most recent b-scan with improved but persistent vitreous opacities  - today, mild diffuse VH hazing posterior view             - IOP 15 OS today             - cont   PF q1h OS                         zymaxid QID OS -- can stop when bottle runs out                         Atropine BID OS                         PSO ung QID OS   cosopt BID OS              - keep head elevated             - eye shield when sleeping             - post op drop and positioning instructions reviewed             -  tylenol/ibuprofen for pain - discussed findings, guarded prognosis and possible need for further treatment - f/u Friday at 9 am  2. Pseudophakia OS - s/p phaco/PCIOL on Monday, 11.7.19   Ophthalmic Meds Ordered this visit:  No orders of the defined types were placed in this encounter.      Return in about 3 days (around 03/07/2017) for POV.  There are no Patient Instructions on file for this visit.   Explained the diagnoses, plan, and follow up with the patient and they expressed understanding.  Patient expressed understanding of the importance of proper follow up care.   This document serves as a record of services personally performed by Karie Chimera, MD, PhD. It was created on their behalf by Virgilio Belling, COA, a certified ophthalmic assistant. The creation of this record is the provider's dictation and/or activities during the visit.  Electronically signed by: Virgilio Belling, COA  03/04/17 1:10 PM   Karie Chimera, M.D., Ph.D. Diseases & Surgery of the Retina and Vitreous [facility] 03/04/17   I have reviewed the above documentation for accuracy and completeness, and I agree with the above. Karie ChimeraBrian G. Sonnie Bias, M.D., Ph.D. 03/04/17 1:11 PM     Abbreviations: M myopia (nearsighted); A astigmatism; H hyperopia (farsighted); P presbyopia; Mrx spectacle prescription;  CTL contact lenses; OD right eye; OS left eye; OU both eyes  XT exotropia; ET esotropia; PEK punctate epithelial keratitis; PEE punctate epithelial erosions; DES dry eye syndrome; MGD meibomian gland dysfunction; ATs artificial tears; PFAT's preservative free artificial tears; NSC nuclear sclerotic cataract; PSC posterior subcapsular cataract; ERM epi-retinal membrane; PVD posterior vitreous detachment; RD retinal detachment; DM diabetes mellitus; DR diabetic retinopathy; NPDR non-proliferative diabetic retinopathy; PDR proliferative diabetic retinopathy; CSME clinically significant  macular edema; DME diabetic macular edema; dbh dot blot hemorrhages; CWS cotton wool spot; POAG primary open angle glaucoma; C/D cup-to-disc ratio; HVF humphrey visual field; GVF goldmann visual field; OCT optical coherence tomography; IOP intraocular pressure; BRVO Branch retinal vein occlusion; CRVO central retinal vein occlusion; CRAO central retinal artery occlusion; BRAO branch retinal artery occlusion; RT retinal tear; SB scleral buckle; PPV pars plana vitrectomy; VH Vitreous hemorrhage; PRP panretinal laser photocoagulation; IVK intravitreal kenalog; VMT vitreomacular traction; MH Macular hole;  NVD neovascularization of the disc; NVE neovascularization elsewhere; AREDS age related eye disease study; ARMD age related macular degeneration; POAG primary open angle glaucoma; EBMD epithelial/anterior basement membrane dystrophy; ACIOL anterior chamber intraocular lens; IOL intraocular lens; PCIOL posterior chamber intraocular lens; Phaco/IOL phacoemulsification with intraocular lens placement; PRK photorefractive keratectomy; LASIK laser assisted in situ keratomileusis; HTN hypertension; DM diabetes mellitus; COPD chronic obstructive pulmonary disease

## 2017-03-04 ENCOUNTER — Ambulatory Visit (INDEPENDENT_AMBULATORY_CARE_PROVIDER_SITE_OTHER): Payer: Medicare Other | Admitting: Ophthalmology

## 2017-03-04 ENCOUNTER — Encounter (INDEPENDENT_AMBULATORY_CARE_PROVIDER_SITE_OTHER): Payer: Self-pay | Admitting: Ophthalmology

## 2017-03-04 DIAGNOSIS — H44002 Unspecified purulent endophthalmitis, left eye: Secondary | ICD-10-CM

## 2017-03-04 DIAGNOSIS — Z961 Presence of intraocular lens: Secondary | ICD-10-CM

## 2017-03-06 NOTE — Progress Notes (Signed)
[facility] - Clinic Note  03/07/2017     CHIEF COMPLAINT Patient presents for Post-op Follow-up s/p phaco/PCIOL OS, 11.7.19, rg S/p PPV/AC washout/intravitreal vanc, ceftaz, cefepime OS, 11.11.19, bz  HISTORY OF PRESENT ILLNESS: Jennifer Estrada is a 72 y.o. female who presents to the clinic today for:   HPI    Post-op Follow-up    In left eye.  Discomfort includes pain.  Negative for itching, foreign body sensation, tearing, discharge, floaters and none.  Vision is stable.  I, the attending physician,  performed the HPI with the patient and updated documentation appropriately.          Comments    S/P PPV/AC washout/intravitreal vanc, ceftaz,cefepime OS (11.11.19);  Pt states OS VA continues to be poor, pt reports she is still seeing "trash" but states it 'seems to be less than it was'; Pt reports OS has been painful; Pt reports using gtts as directed;        Last edited by Rennis ChrisZamora, Shir Bergman, MD on 03/07/2017  9:59 AM. (History)    Pt states she has an appointment next week to see Dr. Thurmon Fair. Groats son - Dr. Laruth BouchardS. Groat;   Referring physician: Ernesto RutherfordGroat, Robert, MD [address] [place], [place] [id number]  HISTORICAL INFORMATION:   Selected notes from the MEDICAL RECORD NUMBER Referred by Dr. Hortense Ramal. Groat for concern of endophthalmitis OS  LEE- 01.11.19  Ocular Hx- CE/toric IOL OS (01.07.19 - R. Groat) - VA 1 day PO OS 20/30, VA OS (01.11.19) HM PMH-     CURRENT MEDICATIONS: Current Outpatient Medications (Ophthalmic Drugs)  Medication Sig  . bacitracin-polymyxin b (POLYSPORIN) ophthalmic ointment Place into the left eye 4 (four) times daily. Place a 1/2 inch ribbon of ointment into the lower eyelid.  . prednisoLONE acetate (PRED FORTE) 1 % ophthalmic suspension Place 1 drop into the right eye every hour.   No current facility-administered medications for this visit.  (Ophthalmic Drugs)   Current Outpatient Medications (Other)  Medication Sig  . Calcium  Carbonate-Vitamin D (CALCIUM + D PO) Take 1 tablet by mouth daily.  . chlorhexidine (PERIDEX) 0.12 % solution Use as directed 15 mLs in the mouth or throat daily as needed (sores).   Marland Kitchen. dexlansoprazole (DEXILANT) 60 MG capsule Take 60 mg by mouth daily.  Marland Kitchen. docusate sodium 100 MG CAPS Take 100 mg by mouth 2 (two) times daily.  . fexofenadine (ALLEGRA) 30 MG tablet Take 30 mg by mouth as needed (allergies).   Marland Kitchen. PARoxetine (PAXIL) 20 MG tablet Take 20 mg by mouth every morning.  . propranolol ER (INDERAL LA) 80 MG 24 hr capsule Take 1 capsule (80 mg total) by mouth daily. (Patient taking differently: Take 60 mg by mouth daily. )  . rosuvastatin (CRESTOR) 5 MG tablet Take 5 mg by mouth daily.   No current facility-administered medications for this visit.  (Other)      REVIEW OF SYSTEMS: ROS    Positive for: Musculoskeletal, Eyes   Negative for: Constitutional, Gastrointestinal, Neurological, Skin, Genitourinary, HENT, Endocrine, Cardiovascular, Respiratory, Psychiatric, Allergic/Imm, Heme/Lymph   Last edited by Concepcion ElkFabian, Meredith C on 03/07/2017  9:14 AM. (History)       ALLERGIES Allergies  Allergen Reactions  . Atorvastatin Other (See Comments)    Feels anxous  . Cortisone Other (See Comments)    headache  . Oxycodone Other (See Comments)    confusion  . Paroxetine Other (See Comments)    anxiety  . Penicillins  Other (See Comments)    Unknown per pt  . Pravastatin Other (See Comments)    muscle pain   . Singulair [Montelukast] Other (See Comments)    Chest pain  . Sulfa Antibiotics Nausea Only  . Aleve [Naproxen Sodium] Rash  . Augmentin [Amoxicillin-Pot Clavulanate] Rash  . Prednisone Other (See Comments)    headaches    PAST MEDICAL HISTORY Past Medical History:  Diagnosis Date  . Allergic rhinitis   . Chronic kidney disease   . Depression   . DJD (degenerative joint disease) of knee   . GERD (gastroesophageal reflux disease)   . Hyperlipidemia   . Migraine     Takes Propranolol  . Neuromuscular disorder (HCC)    Body tremors at times,h/o falling cast right arm  . Osteoporosis   . Tremors of nervous system   . Vitamin D deficiency    Past Surgical History:  Procedure Laterality Date  . KNEE SURGERY    . Left Foot Surgery  20 years ago   20 years ago  . ORIF PATELLA Left 02/12/2014   Procedure: OPEN REDUCTION INTERNAL (ORIF) FIXATION LEFT PATELLA;  Surgeon: [name], MD;  Location: [facility];  Service: Orthopedics;  Laterality: Left;  . PARS PLANA VITRECTOMY Left 02/21/2017   Procedure: PARS PLANA VITRECTOMY 25 GAUGE FOR ENDOPHTHALMITIS;  Surgeon: [name], MD;  Location: [facility] OR;  Service: Ophthalmology;  Laterality: Left;  . WRIST FRACTURE SURGERY      FAMILY HISTORY History reviewed. No pertinent family history.  SOCIAL HISTORY Social History   Tobacco Use  . Smoking status: Never Smoker  . Smokeless tobacco: Never Used  Substance Use Topics  . Alcohol use: No  . Drug use: No         OPHTHALMIC EXAM:  Base Eye Exam    Visual Acuity (Snellen - Linear)      Right Left   Dist [place] 20/40 20/800   Dist ph [place] 20/30 NI       Tonometry (Tonopen, 9:05 AM)      Right Left   Pressure 10 12       Pupils      Dark Light Shape React APD   Right 4 3 Round Brisk None   Left 7  Irregular NR None       Extraocular Movement      Right Left    Full Full       Neuro/Psych    Oriented x3:  Yes   Mood/Affect:  Normal       Dilation    Left eye:  1.0% Mydriacyl, 2.5% Phenylephrine @ 9:05 AM        Slit Lamp and Fundus Exam    Slit Lamp Exam      Right Left   Lids/Lashes Normal Normal   Conjunctiva/Sclera White and quiet sutures intact; SCH and Injection   Cornea Arcus Arcus, 2+ Edema, 1+ descemet folds; nylon sutures temporally and inferiorly; 2-3+ Punctate epithelial erosions with irregular epithelium improved, dry tear film   Anterior Chamber Deep and quiet 3-4+ Cell and RBC, Fibrin and pupillary membrane clearing;  clotted hemes at 12 and 3 oclock shrinking   Iris Round and dilated Irregular pupil with temporal iridectomy/iridodialysis; clotted heme at 12 and 3 shrinking   Lens 2-3+ Nuclear sclerosis, 2+ Cortical cataract PCIOL slightly displaced superiorly; fibrin membrane overlying lens improving   Vitreous Vitreous syneresis red reflex improving, Vitreous debris and hemorrhage clearing and settling inferiorly, anterior vitreous debris and  RBCs, diffuse VH       Fundus Exam      Right Left   Disc  hazy view   C/D Ratio 0.5    Macula  heme overlying   Vessels  Hazy view, Vascular attenuation   Periphery  red reflex          IMAGING AND PROCEDURES           ASSESSMENT/PLAN:    ICD-10-CM   1. 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 Endophthalmitis OS-  - presented on POD4 s/p phaco/PCIOL on Monday, 11.7.19 - VA HM OS w/ 0.74mm hypopyon, fibrin membrane and irregular pupil, and dense vitreous opacities on b-scan ultrasound - now POW2 s/p PPV + AC washout + intravitreal vanc, ceftaz, and cefepime, OS, on 1.11.19  - extensive AC and posterior debris / purulent material noted intra op  - gram stain and culture positive for coag negative staph  - most recent b-scan with improved but persistent vitreous opacities  - today, persistent VH hazing posterior view             - IOP 12 OS today             - cont   PF q1h OS                         zymaxid QID OS -- can stop when bottle runs out                         Atropine BID OS                         PSO ung QID OS   cosopt BID OS              - keep head elevated             - eye shield when sleeping             - post op drop and positioning instructions reviewed             - tylenol/ibuprofen for pain - discussed findings, guarded prognosis and possible need for further treatment - f/u Tuesday next week  2. Pseudophakia OS - s/p phaco/PCIOL on Monday, 11.7.19 - Dr. Dione Booze to f/u with  pt after next weeks appt.   Ophthalmic Meds Ordered this visit:  No orders of the defined types were placed in this encounter.      Return in about 4 days (around 03/11/2017) for S/P PPV +AC washout + intravitreal vanc, ceftaz, and cefepime OS (01.11.19).  There are no Patient Instructions on file for this visit.   Explained the diagnoses, plan, and follow up with the patient and they expressed understanding.  Patient expressed understanding of the importance of proper follow up care.   This document serves as a record of services personally performed by Karie Chimera, MD, PhD. It was created on their behalf by Virgilio Belling, COA, a certified ophthalmic assistant. The creation of this record is the provider's dictation and/or activities during the visit.  Electronically signed by: Virgilio Belling, COA  03/07/17 9:59 AM    Karie Chimera, M.D., Ph.D. Diseases & Surgery of the Retina and Vitreous [facility] 03/07/17   I have reviewed the above documentation for accuracy and completeness,  and I agree with the above. Karie Chimera, M.D., Ph.D. 03/07/17 9:59 AM      Abbreviations: M myopia (nearsighted); A astigmatism; H hyperopia (farsighted); P presbyopia; Mrx spectacle prescription;  CTL contact lenses; OD right eye; OS left eye; OU both eyes  XT exotropia; ET esotropia; PEK punctate epithelial keratitis; PEE punctate epithelial erosions; DES dry eye syndrome; MGD meibomian gland dysfunction; ATs artificial tears; PFAT's preservative free artificial tears; NSC nuclear sclerotic cataract; PSC posterior subcapsular cataract; ERM epi-retinal membrane; PVD posterior vitreous detachment; RD retinal detachment; DM diabetes mellitus; DR diabetic retinopathy; NPDR non-proliferative diabetic retinopathy; PDR proliferative diabetic retinopathy; CSME clinically significant macular edema; DME diabetic macular edema; dbh dot blot hemorrhages; CWS cotton wool spot;  POAG primary open angle glaucoma; C/D cup-to-disc ratio; HVF humphrey visual field; GVF goldmann visual field; OCT optical coherence tomography; IOP intraocular pressure; BRVO Branch retinal vein occlusion; CRVO central retinal vein occlusion; CRAO central retinal artery occlusion; BRAO branch retinal artery occlusion; RT retinal tear; SB scleral buckle; PPV pars plana vitrectomy; VH Vitreous hemorrhage; PRP panretinal laser photocoagulation; IVK intravitreal kenalog; VMT vitreomacular traction; MH Macular hole;  NVD neovascularization of the disc; NVE neovascularization elsewhere; AREDS age related eye disease study; ARMD age related macular degeneration; POAG primary open angle glaucoma; EBMD epithelial/anterior basement membrane dystrophy; ACIOL anterior chamber intraocular lens; IOL intraocular lens; PCIOL posterior chamber intraocular lens; Phaco/IOL phacoemulsification with intraocular lens placement; PRK photorefractive keratectomy; LASIK laser assisted in situ keratomileusis; HTN hypertension; DM diabetes mellitus; COPD chronic obstructive pulmonary disease

## 2017-03-07 ENCOUNTER — Encounter (INDEPENDENT_AMBULATORY_CARE_PROVIDER_SITE_OTHER): Payer: Self-pay | Admitting: Ophthalmology

## 2017-03-07 ENCOUNTER — Ambulatory Visit (INDEPENDENT_AMBULATORY_CARE_PROVIDER_SITE_OTHER): Payer: Medicare Other | Admitting: Ophthalmology

## 2017-03-07 DIAGNOSIS — Z961 Presence of intraocular lens: Secondary | ICD-10-CM

## 2017-03-07 DIAGNOSIS — H44002 Unspecified purulent endophthalmitis, left eye: Secondary | ICD-10-CM

## 2017-03-10 NOTE — Progress Notes (Signed)
[facility] - Clinic Note  03/11/2017     CHIEF COMPLAINT Patient presents for Post-op Follow-up s/p phaco/PCIOL OS, 11.7.19, rg S/p PPV/AC washout/intravitreal vanc, ceftaz, cefepime OS, 11.11.19, bz  HISTORY OF PRESENT ILLNESS: Jennifer Estrada is a 72 y.o. female who presents to the clinic today for:   HPI    Post-op Follow-up    In left eye.  Discomfort includes floaters and pain.  Negative for foreign body sensation, discharge, tearing and itching.  Vision is stable.          Comments    S/P PPV/AC washout/intravitreal vanc, ceftaz,cefepime OS (11.11.19); Pt states OS VA "comes and goes"; Pt reports she continues to see floaters OS; Pt states at times she is able to see and other times she is not; Pt reports she has not seen "much red floating around as before"; Pt reports she has had "a little pain";        Last edited by Concepcion Elk on 03/11/2017 10:04 AM. (History)    Pt states she has an appointment next week to see Dr. Thurmon Fair son - Dr. Laruth Bouchard;   Referring physician: Merlene Laughter, MD [address] [place], [place] [address]  HISTORICAL INFORMATION:   Selected notes from the MEDICAL RECORD NUMBER Referred by Dr. Hortense Ramal for concern of endophthalmitis OS  LEE- 01.11.19  Ocular Hx- CE/toric IOL OS (01.07.19 - R. Groat) - VA 1 day PO OS 20/30, VA OS (01.11.19) HM PMH-     CURRENT MEDICATIONS: Current Outpatient Medications (Ophthalmic Drugs)  Medication Sig  . bacitracin-polymyxin b (POLYSPORIN) ophthalmic ointment Place into the left eye 4 (four) times daily. Place a 1/2 inch ribbon of ointment into the lower eyelid.  . prednisoLONE acetate (PRED FORTE) 1 % ophthalmic suspension Place 1 drop into the right eye every hour.   No current facility-administered medications for this visit.  (Ophthalmic Drugs)   Current Outpatient Medications (Other)  Medication Sig  . Calcium Carbonate-Vitamin D (CALCIUM + D PO) Take 1  tablet by mouth daily.  . chlorhexidine (PERIDEX) 0.12 % solution Use as directed 15 mLs in the mouth or throat daily as needed (sores).   Marland Kitchen dexlansoprazole (DEXILANT) 60 MG capsule Take 60 mg by mouth daily.  Marland Kitchen docusate sodium 100 MG CAPS Take 100 mg by mouth 2 (two) times daily.  . fexofenadine (ALLEGRA) 30 MG tablet Take 30 mg by mouth as needed (allergies).   Marland Kitchen PARoxetine (PAXIL) 20 MG tablet Take 20 mg by mouth every morning.  . propranolol ER (INDERAL LA) 80 MG 24 hr capsule Take 1 capsule (80 mg total) by mouth daily. (Patient taking differently: Take 60 mg by mouth daily. )  . rosuvastatin (CRESTOR) 5 MG tablet Take 5 mg by mouth daily.   No current facility-administered medications for this visit.  (Other)      REVIEW OF SYSTEMS: ROS    Positive for: Musculoskeletal, Eyes   Negative for: Constitutional, Gastrointestinal, Neurological, Skin, Genitourinary, HENT, Endocrine, Cardiovascular, Respiratory, Psychiatric, Allergic/Imm, Heme/Lymph   Last edited by Concepcion Elk on 03/11/2017 10:01 AM. (History)       ALLERGIES Allergies  Allergen Reactions  . Atorvastatin Other (See Comments)    Feels anxous  . Cortisone Other (See Comments)    headache  . Oxycodone Other (See Comments)    confusion  . Paroxetine Other (See Comments)    anxiety  . Penicillins Other (See Comments)  Unknown per pt  . Pravastatin Other (See Comments)    muscle pain   . Singulair [Montelukast] Other (See Comments)    Chest pain  . Sulfa Antibiotics Nausea Only  . Aleve [Naproxen Sodium] Rash  . Augmentin [Amoxicillin-Pot Clavulanate] Rash  . Prednisone Other (See Comments)    headaches    PAST MEDICAL HISTORY Past Medical History:  Diagnosis Date  . Allergic rhinitis   . Chronic kidney disease   . Depression   . DJD (degenerative joint disease) of knee   . GERD (gastroesophageal reflux disease)   . Hyperlipidemia   . Migraine    Takes Propranolol  . Neuromuscular disorder  (HCC)    Body tremors at times,h/o falling cast right arm  . Osteoporosis   . Tremors of nervous system   . Vitamin D deficiency    Past Surgical History:  Procedure Laterality Date  . KNEE SURGERY    . Left Foot Surgery  20 years ago   20 years ago  . ORIF PATELLA Left 02/12/2014   Procedure: OPEN REDUCTION INTERNAL (ORIF) FIXATION LEFT PATELLA;  Surgeon: Shelda PalMatthew D Olin, MD;  Location: [facility];  Service: Orthopedics;  Laterality: Left;  . PARS PLANA VITRECTOMY Left 02/21/2017   Procedure: PARS PLANA VITRECTOMY 25 GAUGE FOR ENDOPHTHALMITIS;  Surgeon: Rennis ChrisZamora, Maui Ahart, MD;  [facility];  Service: Ophthalmology;  Laterality: Left;  . WRIST FRACTURE SURGERY      FAMILY HISTORY History reviewed. No pertinent family history.  SOCIAL HISTORY Social History   Tobacco Use  . Smoking status: Never Smoker  . Smokeless tobacco: Never Used  Substance Use Topics  . Alcohol use: No  . Drug use: No         OPHTHALMIC EXAM:  Base Eye Exam    Visual Acuity (Snellen - Linear)      Right Left   Dist [place] 20/40 20/300 +2   Dist ph [place] 20/30 NI       Tonometry (Tonopen, 10:11 AM)      Right Left   Pressure 12 08       Pupils      Dark Light Shape React APD   Right 4 3 Round Brisk None   Left 7  Irregular NR None       Neuro/Psych    Oriented x3:  Yes   Mood/Affect:  Normal       Dilation    Left eye:  1.0% Mydriacyl, 2.5% Phenylephrine @ 10:11 AM        Slit Lamp and Fundus Exam    Slit Lamp Exam      Right Left   Lids/Lashes Normal Normal   Conjunctiva/Sclera White and quiet sutures intact; SCH and Injection - improved   Cornea Arcus Arcus, 2+ Edema, 1-2+ descemet folds; nylon sutures temporally and inferiorly; 1+ peripheral Punctate epithelial erosions with irregular epithelium improved, dry tear film   Anterior Chamber Deep and quiet 2-3+ Cell and RBC, Fibrin and pupillary membrane clearing; clotted hemes at 12 and 3 oclock - improving   Iris Round and dilated  Irregular pupil with temporal iridectomy/iridodialysis; clotted heme at 12 and 3 shrinking   Lens 2-3+ Nuclear sclerosis, 2+ Cortical cataract PCIOL slightly displaced superiorly; fibrin membrane overlying lens improving   Vitreous Vitreous syneresis red reflex improving, Vitreous debris and hemorrhage clearing and settling inferiorly       Fundus Exam      Right Left   Disc  hazy view   C/D Ratio  0.5    Macula  Hazy view, grossly attached, heme overlying - improving   Vessels  Hazy view, Vascular attenuation   Periphery  red reflex          IMAGING AND PROCEDURES           ASSESSMENT/PLAN:    ICD-10-CM   1. 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 Endophthalmitis OS-  - presented on POD4 s/p phaco/PCIOL on Monday, 11.7.19 - VA HM OS w/ 0.32mm hypopyon, fibrin membrane and irregular pupil, and dense vitreous opacities on b-scan ultrasound - now POW2 s/p PPV + AC washout + intravitreal vanc, ceftaz, and cefepime, OS, on 1.11.19  - extensive AC and posterior debris / purulent material noted intra op  - gram stain and culture positive for coag negative staph  - most recent b-scan with improved but persistent vitreous opacities  - today, persistent VH hazing posterior view but improved slightly today             - IOP 08 OS today             - cont   PF q1h OS                         zymaxid QID OS - D/C                         Atropine BID OS                         PSO ung QID OS   cosopt BID OS - D/C for now             - keep head elevated             - eye shield when sleeping             - post op drop and positioning instructions reviewed             - tylenol/ibuprofen for pain - discussed findings, guarded prognosis and possible need for further treatment - f/u Friday this week  2. Pseudophakia OS - s/p phaco/PCIOL on Monday, 11.7.19 - Dr. Dione Booze to f/u with pt after next weeks appt.   Ophthalmic Meds Ordered this  visit:  No orders of the defined types were placed in this encounter.      Return in about 3 days (around 03/14/2017) for F/U Enophthalmitis OS.  There are no Patient Instructions on file for this visit.   Explained the diagnoses, plan, and follow up with the patient and they expressed understanding.  Patient expressed understanding of the importance of proper follow up care.   This document serves as a record of services personally performed by Karie Chimera, MD, PhD. It was created on their behalf by Virgilio Belling, COA, a certified ophthalmic assistant. The creation of this record is the provider's dictation and/or activities during the visit.  Electronically signed by: Virgilio Belling, COA  03/11/17 11:00 AM    Karie Chimera, M.D., Ph.D. Diseases & Surgery of the Retina and Vitreous [facility] 03/11/17   I have reviewed the above documentation for accuracy and completeness, and I agree with the above. Karie Chimera, M.D., Ph.D. 03/11/17 11:01 AM      Abbreviations: M myopia (nearsighted); A astigmatism; H hyperopia (farsighted); P  presbyopia; Mrx spectacle prescription;  CTL contact lenses; OD right eye; OS left eye; OU both eyes  XT exotropia; ET esotropia; PEK punctate epithelial keratitis; PEE punctate epithelial erosions; DES dry eye syndrome; MGD meibomian gland dysfunction; ATs artificial tears; PFAT's preservative free artificial tears; NSC nuclear sclerotic cataract; PSC posterior subcapsular cataract; ERM epi-retinal membrane; PVD posterior vitreous detachment; RD retinal detachment; DM diabetes mellitus; DR diabetic retinopathy; NPDR non-proliferative diabetic retinopathy; PDR proliferative diabetic retinopathy; CSME clinically significant macular edema; DME diabetic macular edema; dbh dot blot hemorrhages; CWS cotton wool spot; POAG primary open angle glaucoma; C/D cup-to-disc ratio; HVF humphrey visual field; GVF goldmann visual field; OCT  optical coherence tomography; IOP intraocular pressure; BRVO Branch retinal vein occlusion; CRVO central retinal vein occlusion; CRAO central retinal artery occlusion; BRAO branch retinal artery occlusion; RT retinal tear; SB scleral buckle; PPV pars plana vitrectomy; VH Vitreous hemorrhage; PRP panretinal laser photocoagulation; IVK intravitreal kenalog; VMT vitreomacular traction; MH Macular hole;  NVD neovascularization of the disc; NVE neovascularization elsewhere; AREDS age related eye disease study; ARMD age related macular degeneration; POAG primary open angle glaucoma; EBMD epithelial/anterior basement membrane dystrophy; ACIOL anterior chamber intraocular lens; IOL intraocular lens; PCIOL posterior chamber intraocular lens; Phaco/IOL phacoemulsification with intraocular lens placement; PRK photorefractive keratectomy; LASIK laser assisted in situ keratomileusis; HTN hypertension; DM diabetes mellitus; COPD chronic obstructive pulmonary disease

## 2017-03-11 ENCOUNTER — Ambulatory Visit (INDEPENDENT_AMBULATORY_CARE_PROVIDER_SITE_OTHER): Payer: Medicare Other | Admitting: Ophthalmology

## 2017-03-11 ENCOUNTER — Encounter (INDEPENDENT_AMBULATORY_CARE_PROVIDER_SITE_OTHER): Payer: Self-pay | Admitting: Ophthalmology

## 2017-03-11 DIAGNOSIS — H44002 Unspecified purulent endophthalmitis, left eye: Secondary | ICD-10-CM

## 2017-03-11 DIAGNOSIS — Z961 Presence of intraocular lens: Secondary | ICD-10-CM

## 2017-03-13 NOTE — Progress Notes (Signed)
[facility] - Clinic Note  03/14/2017     CHIEF COMPLAINT Patient presents for Post-op Follow-up s/p phaco/PCIOL OS, 11.7.19, rg S/p PPV/AC washout/intravitreal vanc, ceftaz, cefepime OS, 11.11.19, bz  HISTORY OF PRESENT ILLNESS: Jennifer Estrada is a 72 y.o. female who presents to the clinic today for:   HPI    Post-op Follow-up    In left eye.  Discomfort includes pain and floaters.  Negative for itching, foreign body sensation, tearing, discharge and none.  Vision is stable.  I, the attending physician,  performed the HPI with the patient and updated documentation appropriately.          Comments    S/P PPV/AC washout/intravitreal vanc, ceftaz,cefepime OS (11.11.19), for endophthalmitis OS; Pt states she continues to have floaters, pt states the floaters "are more clear"; Pt states by the end of the day she has a swollen feeling OS; Pt reports using PF OS q1hr, atropine OS BID, PSO ung OS QID as directed;        Last edited by [name], MD on 03/14/2017  9:48 AM. (History)      Referring physician: Merlene Laughter, MD [address] [place], [place] [address]  HISTORICAL INFORMATION:   Selected notes from the MEDICAL RECORD NUMBER Referred by Dr. Hortense Ramal for concern of endophthalmitis OS  LEE- 01.11.19  Ocular Hx- CE/toric IOL OS (01.07.19 - R. Groat) - VA 1 day PO OS 20/30, VA OS (01.11.19) HM PMH-     CURRENT MEDICATIONS: Current Outpatient Medications (Ophthalmic Drugs)  Medication Sig  . bacitracin-polymyxin b (POLYSPORIN) ophthalmic ointment Place into the left eye 4 (four) times daily. Place a 1/2 inch ribbon of ointment into the lower eyelid.  . prednisoLONE acetate (PRED FORTE) 1 % ophthalmic suspension Place 1 drop into the right eye every hour.   No current facility-administered medications for this visit.  (Ophthalmic Drugs)   Current Outpatient Medications (Other)  Medication Sig  . Calcium Carbonate-Vitamin D (CALCIUM  + D PO) Take 1 tablet by mouth daily.  . chlorhexidine (PERIDEX) 0.12 % solution Use as directed 15 mLs in the mouth or throat daily as needed (sores).   Marland Kitchen dexlansoprazole (DEXILANT) 60 MG capsule Take 60 mg by mouth daily.  Marland Kitchen docusate sodium 100 MG CAPS Take 100 mg by mouth 2 (two) times daily.  . fexofenadine (ALLEGRA) 30 MG tablet Take 30 mg by mouth as needed (allergies).   Marland Kitchen PARoxetine (PAXIL) 20 MG tablet Take 20 mg by mouth every morning.  . propranolol ER (INDERAL LA) 80 MG 24 hr capsule Take 1 capsule (80 mg total) by mouth daily. (Patient taking differently: Take 60 mg by mouth daily. )  . rosuvastatin (CRESTOR) 5 MG tablet Take 5 mg by mouth daily.   No current facility-administered medications for this visit.  (Other)      REVIEW OF SYSTEMS: ROS    Positive for: Musculoskeletal, Eyes   Negative for: Constitutional, Gastrointestinal, Neurological, Skin, Genitourinary, HENT, Endocrine, Cardiovascular, Respiratory, Psychiatric, Allergic/Imm, Heme/Lymph   Last edited by Concepcion Elk on 03/14/2017  9:05 AM. (History)       ALLERGIES Allergies  Allergen Reactions  . Atorvastatin Other (See Comments)    Feels anxous  . Cortisone Other (See Comments)    headache  . Oxycodone Other (See Comments)    confusion  . Paroxetine Other (See Comments)    anxiety  . Penicillins Other (See Comments)    Unknown per pt  .  Pravastatin Other (See Comments)    muscle pain   . Singulair [Montelukast] Other (See Comments)    Chest pain  . Sulfa Antibiotics Nausea Only  . Aleve [Naproxen Sodium] Rash  . Augmentin [Amoxicillin-Pot Clavulanate] Rash  . Prednisone Other (See Comments)    headaches    PAST MEDICAL HISTORY Past Medical History:  Diagnosis Date  . Allergic rhinitis   . Chronic kidney disease   . Depression   . DJD (degenerative joint disease) of knee   . GERD (gastroesophageal reflux disease)   . Hyperlipidemia   . Migraine    Takes Propranolol  .  Neuromuscular disorder (HCC)    Body tremors at times,h/o falling cast right arm  . Osteoporosis   . Tremors of nervous system   . Vitamin D deficiency    Past Surgical History:  Procedure Laterality Date  . KNEE SURGERY    . Left Foot Surgery  20 years ago   20 years ago  . ORIF PATELLA Left 02/12/2014   Procedure: OPEN REDUCTION INTERNAL (ORIF) FIXATION LEFT PATELLA;  Surgeon: Shelda PalMatthew D Olin, MD;  Location: [facility];  Service: Orthopedics;  Laterality: Left;  . PARS PLANA VITRECTOMY Left 02/21/2017   Procedure: PARS PLANA VITRECTOMY 25 GAUGE FOR ENDOPHTHALMITIS;  Surgeon: Rennis ChrisZamora, Berlin Mokry, MD;  [facility];  Service: Ophthalmology;  Laterality: Left;  . WRIST FRACTURE SURGERY      FAMILY HISTORY History reviewed. No pertinent family history.  SOCIAL HISTORY Social History   Tobacco Use  . Smoking status: Never Smoker  . Smokeless tobacco: Never Used  Substance Use Topics  . Alcohol use: No  . Drug use: No         OPHTHALMIC EXAM:  Base Eye Exam    Visual Acuity (Snellen - Linear)      Right Left   Dist [place] 20/40 20/400   Dist ph [place] 20/30 NI       Tonometry (Tonopen, 9:15 AM)      Right Left   Pressure 14 05       Tonometry #2 (Applanation, 9:38 AM)      Right Left   Pressure  7       Pupils      Dark Light Shape React APD   Right 4 3 Round Slow None   Left 7  Irregular  None       Neuro/Psych    Oriented x3:  Yes   Mood/Affect:  Normal       Dilation    Left eye:  1.0% Mydriacyl @ 9:15 AM        Slit Lamp and Fundus Exam    Slit Lamp Exam      Right Left   Lids/Lashes Normal Normal   Conjunctiva/Sclera White and quiet sutures intact; SCH and Injection - improved   Cornea Arcus Arcus, 2+ Edema, 1-2+ descemet folds; nylon sutures temporally and inferiorly; 1+ peripheral Punctate epithelial erosions with irregular epithelium improved, dry tear film   Anterior Chamber Deep and quiet 2-3+ Cell and RBC, Fibrin and pupillary membrane clearing; clotted  hemes at 12 and 3 oclock - improving   Iris Round and dilated Irregular pupil with temporal iridectomy/iridodialysis; clotted heme at 12 and 3 shrinking   Lens 2-3+ Nuclear sclerosis, 2+ Cortical cataract PCIOL slightly displaced superiorly; fibrin membrane overlying lens improving   Vitreous Vitreous syneresis red reflex improving, Vitreous debris and hemorrhage clearing and settling inferiorly       Fundus Exam  Right Left   Disc  hazy view   C/D Ratio 0.5    Macula  Hazy view, grossly attached, heme overlying - improving   Vessels  Hazy view, Vascular attenuation   Periphery  red reflex          IMAGING AND PROCEDURES           ASSESSMENT/PLAN:    ICD-10-CM   1. 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 Endophthalmitis OS-  - presented on POD4 s/p phaco/PCIOL on Monday, 11.7.19 - VA HM OS w/ 0.39mm hypopyon, fibrin membrane and irregular pupil, and dense vitreous opacities on b-scan ultrasound - now POW3 s/p PPV + AC washout + intravitreal vanc, ceftaz, and cefepime, OS, on 1.11.19  - extensive AC and posterior debris / purulent material noted intra op  - gram stain and culture positive for coag negative staph  - most recent b-scan with improved but persistent vitreous opacities  - today, persistent VH hazing posterior view but improved slightly today             - IOP 7 OS today -- cosopt stopped last visit             - cont   PF q1h OS                         Atropine BID OS                         PSO ung QID OS             - keep head elevated             - eye shield when sleeping             - post op drop and positioning instructions reviewed             - tylenol/ibuprofen for pain - discussed findings, guarded prognosis and possible need for further treatment - f/u 1 wk  2. Pseudophakia OS - s/p phaco/PCIOL on Monday, 11.7.19 - Dr. Dione Booze to f/u with pt after next weeks appt.   Ophthalmic Meds Ordered this  visit:  No orders of the defined types were placed in this encounter.      Return in about 1 week (around 03/21/2017) for POV.  There are no Patient Instructions on file for this visit.   Explained the diagnoses, plan, and follow up with the patient and they expressed understanding.  Patient expressed understanding of the importance of proper follow up care.   This document serves as a record of services personally performed by Karie Chimera, MD, PhD. It was created on their behalf by Virgilio Belling, COA, a certified ophthalmic assistant. The creation of this record is the provider's dictation and/or activities during the visit.  Electronically signed by: Virgilio Belling, COA  03/14/17 2:40 PM    Karie Chimera, M.D., Ph.D. Diseases & Surgery of the Retina and Vitreous [facility] 03/14/17   I have reviewed the above documentation for accuracy and completeness, and I agree with the above. Karie Chimera, M.D., Ph.D. 03/14/17 2:40 PM      Abbreviations: M myopia (nearsighted); A astigmatism; H hyperopia (farsighted); P presbyopia; Mrx spectacle prescription;  CTL contact lenses; OD right eye; OS left eye; OU both eyes  XT exotropia; ET esotropia; PEK punctate  epithelial keratitis; PEE punctate epithelial erosions; DES dry eye syndrome; MGD meibomian gland dysfunction; ATs artificial tears; PFAT's preservative free artificial tears; NSC nuclear sclerotic cataract; PSC posterior subcapsular cataract; ERM epi-retinal membrane; PVD posterior vitreous detachment; RD retinal detachment; DM diabetes mellitus; DR diabetic retinopathy; NPDR non-proliferative diabetic retinopathy; PDR proliferative diabetic retinopathy; CSME clinically significant macular edema; DME diabetic macular edema; dbh dot blot hemorrhages; CWS cotton wool spot; POAG primary open angle glaucoma; C/D cup-to-disc ratio; HVF humphrey visual field; GVF goldmann visual field; OCT optical coherence  tomography; IOP intraocular pressure; BRVO Branch retinal vein occlusion; CRVO central retinal vein occlusion; CRAO central retinal artery occlusion; BRAO branch retinal artery occlusion; RT retinal tear; SB scleral buckle; PPV pars plana vitrectomy; VH Vitreous hemorrhage; PRP panretinal laser photocoagulation; IVK intravitreal kenalog; VMT vitreomacular traction; MH Macular hole;  NVD neovascularization of the disc; NVE neovascularization elsewhere; AREDS age related eye disease study; ARMD age related macular degeneration; POAG primary open angle glaucoma; EBMD epithelial/anterior basement membrane dystrophy; ACIOL anterior chamber intraocular lens; IOL intraocular lens; PCIOL posterior chamber intraocular lens; Phaco/IOL phacoemulsification with intraocular lens placement; PRK photorefractive keratectomy; LASIK laser assisted in situ keratomileusis; HTN hypertension; DM diabetes mellitus; COPD chronic obstructive pulmonary disease

## 2017-03-14 ENCOUNTER — Ambulatory Visit (INDEPENDENT_AMBULATORY_CARE_PROVIDER_SITE_OTHER): Payer: Medicare Other | Admitting: Ophthalmology

## 2017-03-14 ENCOUNTER — Encounter (INDEPENDENT_AMBULATORY_CARE_PROVIDER_SITE_OTHER): Payer: Self-pay | Admitting: Ophthalmology

## 2017-03-14 DIAGNOSIS — Z961 Presence of intraocular lens: Secondary | ICD-10-CM

## 2017-03-14 DIAGNOSIS — H44002 Unspecified purulent endophthalmitis, left eye: Secondary | ICD-10-CM

## 2017-03-17 ENCOUNTER — Encounter (INDEPENDENT_AMBULATORY_CARE_PROVIDER_SITE_OTHER): Payer: Self-pay | Admitting: Ophthalmology

## 2017-03-17 ENCOUNTER — Ambulatory Visit (INDEPENDENT_AMBULATORY_CARE_PROVIDER_SITE_OTHER): Payer: Medicare Other | Admitting: Ophthalmology

## 2017-03-17 DIAGNOSIS — H4312 Vitreous hemorrhage, left eye: Secondary | ICD-10-CM

## 2017-03-17 DIAGNOSIS — Z961 Presence of intraocular lens: Secondary | ICD-10-CM

## 2017-03-17 DIAGNOSIS — H44002 Unspecified purulent endophthalmitis, left eye: Secondary | ICD-10-CM | POA: Diagnosis not present

## 2017-03-17 NOTE — Progress Notes (Signed)
[facility] - Clinic Note  03/17/2017     CHIEF COMPLAINT Patient presents for Post-op Follow-up s/p phaco/PCIOL OS, 11.7.19, rg S/p PPV/AC washout/intravitreal vanc, ceftaz, cefepime OS, 11.11.19, bz  HISTORY OF PRESENT ILLNESS: Jennifer Estrada is a 72 y.o. female who presents to the clinic today for:   HPI    Post-op Follow-up    In left eye.  Discomfort includes pain.  Negative for itching, foreign body sensation, tearing, discharge, floaters and none.  Vision is stable.  I, the attending physician,  performed the HPI with the patient and updated documentation appropriately.          Comments    S/p PPV/AC washout/intravitreal vanc, ceftaz, cefepime OS, 11.11.19;  Pt states she is "no seeing anything"; Pt states "everything is white"; Pt states she no longer sees floaters or flashes x 1 day ago: Pt states it feels like there is pressure behind OS and feels like face is swollen; Pt reports using PF OS q1hr, Atropine OS BID, and PSO ung OS QID as directed;         Last edited by Rennis ChrisZamora, Kashlynn Kundert, MD on 03/17/2017  2:20 PM. (History)      Referring physician: Merlene LaughterStoneking, Hal, MD [address] [place] [address]  HISTORICAL INFORMATION:   Selected notes from the MEDICAL RECORD NUMBER Referred by Dr. Hortense Ramal. Groat for concern of endophthalmitis OS  LEE- 01.11.19  Ocular Hx- CE/toric IOL OS (01.07.19 - R. Groat) - VA 1 day PO OS 20/30, VA OS (01.11.19) HM PMH-     CURRENT MEDICATIONS: Current Outpatient Medications (Ophthalmic Drugs)  Medication Sig  . bacitracin-polymyxin b (POLYSPORIN) ophthalmic ointment Place into the left eye 4 (four) times daily. Place a 1/2 inch ribbon of ointment into the lower eyelid.  . prednisoLONE acetate (PRED FORTE) 1 % ophthalmic suspension Place 1 drop into the right eye every hour.   No current facility-administered medications for this visit.  (Ophthalmic Drugs)   Current Outpatient Medications (Other)   Medication Sig  . Calcium Carbonate-Vitamin D (CALCIUM + D PO) Take 1 tablet by mouth daily.  . chlorhexidine (PERIDEX) 0.12 % solution Use as directed 15 mLs in the mouth or throat daily as needed (sores).   Marland Kitchen. dexlansoprazole (DEXILANT) 60 MG capsule Take 60 mg by mouth daily.  Marland Kitchen. docusate sodium 100 MG CAPS Take 100 mg by mouth 2 (two) times daily.  . fexofenadine (ALLEGRA) 30 MG tablet Take 30 mg by mouth as needed (allergies).   Marland Kitchen. PARoxetine (PAXIL) 20 MG tablet Take 20 mg by mouth every morning.  . propranolol ER (INDERAL LA) 80 MG 24 hr capsule Take 1 capsule (80 mg total) by mouth daily. (Patient taking differently: Take 60 mg by mouth daily. )  . rosuvastatin (CRESTOR) 5 MG tablet Take 5 mg by mouth daily.   No current facility-administered medications for this visit.  (Other)      REVIEW OF SYSTEMS: ROS    Positive for: Musculoskeletal, Eyes   Negative for: Constitutional, Gastrointestinal, Neurological, Skin, Genitourinary, HENT, Endocrine, Cardiovascular, Respiratory, Psychiatric, Allergic/Imm, Heme/Lymph   Last edited by Concepcion ElkFabian, Meredith C on 03/17/2017 12:57 PM. (History)       ALLERGIES Allergies  Allergen Reactions  . Atorvastatin Other (See Comments)    Feels anxous  . Cortisone Other (See Comments)    headache  . Oxycodone Other (See Comments)    confusion  . Paroxetine Other (See Comments)    anxiety  .  Penicillins Other (See Comments)    Unknown per pt  . Pravastatin Other (See Comments)    muscle pain   . Singulair [Montelukast] Other (See Comments)    Chest pain  . Sulfa Antibiotics Nausea Only  . Aleve [Naproxen Sodium] Rash  . Augmentin [Amoxicillin-Pot Clavulanate] Rash  . Prednisone Other (See Comments)    headaches    PAST MEDICAL HISTORY Past Medical History:  Diagnosis Date  . Allergic rhinitis   . Chronic kidney disease   . Depression   . DJD (degenerative joint disease) of knee   . GERD (gastroesophageal reflux disease)   .  Hyperlipidemia   . Migraine    Takes Propranolol  . Neuromuscular disorder (HCC)    Body tremors at times,h/o falling cast right arm  . Osteoporosis   . Tremors of nervous system   . Vitamin D deficiency    Past Surgical History:  Procedure Laterality Date  . KNEE SURGERY    . Left Foot Surgery  20 years ago   20 years ago  . ORIF PATELLA Left 02/12/2014   Procedure: OPEN REDUCTION INTERNAL (ORIF) FIXATION LEFT PATELLA;  Surgeon: [name], MD;  Location: [facility];  Service: Orthopedics;  Laterality: Left;  . PARS PLANA VITRECTOMY Left 02/21/2017   Procedure: PARS PLANA VITRECTOMY 25 GAUGE FOR ENDOPHTHALMITIS;  Surgeon: [name], MD;  Location: [facility] OR;  Service: Ophthalmology;  Laterality: Left;  . WRIST FRACTURE SURGERY      FAMILY HISTORY History reviewed. No pertinent family history.  SOCIAL HISTORY Social History   Tobacco Use  . Smoking status: Never Smoker  . Smokeless tobacco: Never Used  Substance Use Topics  . Alcohol use: No  . Drug use: No         OPHTHALMIC EXAM:  Base Eye Exam    Visual Acuity (Snellen - Linear)      Right Left   Dist [place] 20/40 20/400   Dist ph [place] 20/30 NI       Tonometry (Tonopen, 1:08 PM)      Right Left   Pressure 14 04       Pupils      Dark Light Shape React APD   Right 4 3 Round Slow None   Left 7  Irregular NR None       Neuro/Psych    Oriented x3:  Yes   Mood/Affect:  Normal       Dilation    Left eye:  1.0% Mydriacyl, 2.5% Phenylephrine @ 1:09 PM        Slit Lamp and Fundus Exam    Slit Lamp Exam      Right Left   Lids/Lashes Normal Normal   Conjunctiva/Sclera White and quiet sutures intact; SCH and Injection - improved   Cornea Arcus Arcus, 2+ Edema, 1-2+ descemet folds; nylon sutures temporally and inferiorly; wounds seidel negative; 1+ peripheral Punctate epithelial erosions with irregular epithelium improved, dry tear film   Anterior Chamber Deep and quiet 2-3+ Cell and RBC, Fibrin and pupillary  membrane clearing; clotted hemes at 12 and 3 oclock - improving   Iris Round and dilated Irregular pupil with temporal iridectomy/iridodialysis; clotted heme at 12 and 3 shrinking   Lens 2-3+ Nuclear sclerosis, 2+ Cortical cataract PCIOL displaced superiorly; fibrin membrane overlying lens resolved   Vitreous Vitreous syneresis red reflex improving, Vitreous debris and hemorrhage clearing and settling inferiorly       Fundus Exam      Right Left  Disc  hazy view; perfused   C/D Ratio 0.5    Macula  Hazy view, grossly attached, heme overlying - improving   Vessels  Hazy view, Vascular attenuation   Periphery  red reflex, SN retina visible          IMAGING AND PROCEDURES           ASSESSMENT/PLAN:    ICD-10-CM   1. 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 Vitreous hemorrhage of left eye (HCC) H43.12   3. 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 Endophthalmitis OS-  - presented on POD4 s/p phaco/PCIOL on Monday, 11.7.19 - VA HM OS w/ 0.64mm hypopyon, fibrin membrane and irregular pupil, and dense vitreous opacities on b-scan ultrasound - now POW3 s/p PPV + AC washout + intravitreal vanc, ceftaz, and cefepime, OS, on 1.11.19  - extensive AC and posterior debris / purulent material noted intra op  - gram stain and culture positive for coag negative staph  - repeat b-scan today 2.4.19 with improved but persistent vitreous opacities  - today, persistent VH hazing posterior view but improved slightly today             - IOP 04 OS today; wounds remain seidel negative             - cont   PF q1h OS                         Atropine BID OS                         PSO ung QID OS             - keep head elevated             - eye shield when sleeping             - post op drop and positioning instructions reviewed             - tylenol/ibuprofen for pain - discussed findings, guarded prognosis and possible need for further treatment - f/u 1  wk  2. Pseudophakia OS - s/p phaco/PCIOL on Monday, 11.7.19 - Dr. Dione Booze to f/u with pt after next weeks appt.   Ophthalmic Meds Ordered this visit:  No orders of the defined types were placed in this encounter.      Return for as scheduled, POV.  There are no Patient Instructions on file for this visit.   Explained the diagnoses, plan, and follow up with the patient and they expressed understanding.  Patient expressed understanding of the importance of proper follow up care.   This document serves as a record of services personally performed by Karie Chimera, MD, PhD. It was created on their behalf by Virgilio Belling, COA, a certified ophthalmic assistant. The creation of this record is the provider's dictation and/or activities during the visit.  Electronically signed by: Virgilio Belling, COA  03/17/17 2:26 PM    Karie Chimera, M.D., Ph.D. Diseases & Surgery of the Retina and Vitreous [facility] 03/17/17   I have reviewed the above documentation for accuracy and completeness, and I agree with the above. Karie Chimera, M.D., Ph.D. 03/17/17 2:26 PM      Abbreviations: M myopia (nearsighted); A astigmatism; H hyperopia (farsighted); P presbyopia; Mrx spectacle prescription;  CTL contact lenses; OD right eye; OS  left eye; OU both eyes  XT exotropia; ET esotropia; PEK punctate epithelial keratitis; PEE punctate epithelial erosions; DES dry eye syndrome; MGD meibomian gland dysfunction; ATs artificial tears; PFAT's preservative free artificial tears; NSC nuclear sclerotic cataract; PSC posterior subcapsular cataract; ERM epi-retinal membrane; PVD posterior vitreous detachment; RD retinal detachment; DM diabetes mellitus; DR diabetic retinopathy; NPDR non-proliferative diabetic retinopathy; PDR proliferative diabetic retinopathy; CSME clinically significant macular edema; DME diabetic macular edema; dbh dot blot hemorrhages; CWS cotton wool spot; POAG  primary open angle glaucoma; C/D cup-to-disc ratio; HVF humphrey visual field; GVF goldmann visual field; OCT optical coherence tomography; IOP intraocular pressure; BRVO Branch retinal vein occlusion; CRVO central retinal vein occlusion; CRAO central retinal artery occlusion; BRAO branch retinal artery occlusion; RT retinal tear; SB scleral buckle; PPV pars plana vitrectomy; VH Vitreous hemorrhage; PRP panretinal laser photocoagulation; IVK intravitreal kenalog; VMT vitreomacular traction; MH Macular hole;  NVD neovascularization of the disc; NVE neovascularization elsewhere; AREDS age related eye disease study; ARMD age related macular degeneration; POAG primary open angle glaucoma; EBMD epithelial/anterior basement membrane dystrophy; ACIOL anterior chamber intraocular lens; IOL intraocular lens; PCIOL posterior chamber intraocular lens; Phaco/IOL phacoemulsification with intraocular lens placement; PRK photorefractive keratectomy; LASIK laser assisted in situ keratomileusis; HTN hypertension; DM diabetes mellitus; COPD chronic obstructive pulmonary disease

## 2017-03-19 NOTE — Progress Notes (Signed)
[facility] - Clinic Note  03/21/2017     CHIEF COMPLAINT Patient presents for Post-op Follow-up s/p phaco/PCIOL OS, 11.7.19, rg S/p PPV/AC washout/intravitreal vanc, ceftaz, cefepime OS, 11.11.19, bz  HISTORY OF PRESENT ILLNESS: Jennifer Estrada is a 72 y.o. female who presents to the clinic today for:   HPI    Post-op Follow-up    In left eye.  Discomfort includes itching.  Negative for pain, foreign body sensation, tearing, discharge, floaters and none.  Vision is stable.  I, the attending physician,  performed the HPI with the patient and updated documentation appropriately.          Comments    Pt presents today for POW4; s/p PPV & AV washout & intravitreal vanc, ceftaz & cefepime OS (01.11.19), pt states VA is stable, eyes itch a little, pt states she is seeing floaters, but denies flashes or wavy vision, pt states compliance with gtts,        Last edited by Concepcion ElkFabian, Meredith C on 03/21/2017  9:58 AM. (History)    Pt states she feels that she is seeing a "tree and bushes"; Pt states she is able to "see through the branches";   Referring physician: Merlene LaughterStoneking, Hal, MD [address] [place], [place] [address]  HISTORICAL INFORMATION:   Selected notes from the MEDICAL RECORD NUMBER Referred by Dr. Hortense Ramal. Groat for concern of endophthalmitis OS  LEE- 01.11.19  Ocular Hx- CE/toric IOL OS (01.07.19 - R. Groat) - VA 1 day PO OS 20/30, VA OS (01.11.19) HM PMH-     CURRENT MEDICATIONS: Current Outpatient Medications (Ophthalmic Drugs)  Medication Sig  . bacitracin-polymyxin b (POLYSPORIN) ophthalmic ointment Place into the left eye 4 (four) times daily. Place a 1/2 inch ribbon of ointment into the lower eyelid.  . prednisoLONE acetate (PRED FORTE) 1 % ophthalmic suspension Place 1 drop into the right eye every hour.   No current facility-administered medications for this visit.  (Ophthalmic Drugs)   Current Outpatient Medications (Other)   Medication Sig  . Calcium Carbonate-Vitamin D (CALCIUM + D PO) Take 1 tablet by mouth daily.  . chlorhexidine (PERIDEX) 0.12 % solution Use as directed 15 mLs in the mouth or throat daily as needed (sores).   Marland Kitchen. dexlansoprazole (DEXILANT) 60 MG capsule Take 60 mg by mouth daily.  Marland Kitchen. docusate sodium 100 MG CAPS Take 100 mg by mouth 2 (two) times daily.  . fexofenadine (ALLEGRA) 30 MG tablet Take 30 mg by mouth as needed (allergies).   Marland Kitchen. PARoxetine (PAXIL) 20 MG tablet Take 20 mg by mouth every morning.  . propranolol ER (INDERAL LA) 80 MG 24 hr capsule Take 1 capsule (80 mg total) by mouth daily. (Patient taking differently: Take 60 mg by mouth daily. )  . rosuvastatin (CRESTOR) 5 MG tablet Take 5 mg by mouth daily.   No current facility-administered medications for this visit.  (Other)      REVIEW OF SYSTEMS: ROS    Positive for: Eyes   Negative for: Constitutional, Gastrointestinal, Neurological, Skin, Genitourinary, Musculoskeletal, HENT, Endocrine, Cardiovascular, Respiratory, Psychiatric, Allergic/Imm, Heme/Lymph   Last edited by Posey BoyerBrown, Amanda J, COT on 03/21/2017  9:38 AM. (History)       ALLERGIES Allergies  Allergen Reactions  . Atorvastatin Other (See Comments)    Feels anxous  . Cortisone Other (See Comments)    headache  . Oxycodone Other (See Comments)    confusion  . Paroxetine Other (See Comments)  anxiety  . Penicillins Other (See Comments)    Unknown per pt  . Pravastatin Other (See Comments)    muscle pain   . Singulair [Montelukast] Other (See Comments)    Chest pain  . Sulfa Antibiotics Nausea Only  . Aleve [Naproxen Sodium] Rash  . Augmentin [Amoxicillin-Pot Clavulanate] Rash  . Prednisone Other (See Comments)    headaches    PAST MEDICAL HISTORY Past Medical History:  Diagnosis Date  . Allergic rhinitis   . Chronic kidney disease   . Depression   . DJD (degenerative joint disease) of knee   . GERD (gastroesophageal reflux disease)   .  Hyperlipidemia   . Migraine    Takes Propranolol  . Neuromuscular disorder (HCC)    Body tremors at times,h/o falling cast right arm  . Osteoporosis   . Tremors of nervous system   . Vitamin D deficiency    Past Surgical History:  Procedure Laterality Date  . KNEE SURGERY    . Left Foot Surgery  20 years ago   20 years ago  . ORIF PATELLA Left 02/12/2014   Procedure: OPEN REDUCTION INTERNAL (ORIF) FIXATION LEFT PATELLA;  Surgeon: [name], MD;  Location: [facility];  Service: Orthopedics;  Laterality: Left;  . PARS PLANA VITRECTOMY Left 02/21/2017   Procedure: PARS PLANA VITRECTOMY 25 GAUGE FOR ENDOPHTHALMITIS;  Surgeon: [name], MD;  Location: [facility] OR;  Service: Ophthalmology;  Laterality: Left;  . WRIST FRACTURE SURGERY      FAMILY HISTORY History reviewed. No pertinent family history.  SOCIAL HISTORY Social History   Tobacco Use  . Smoking status: Never Smoker  . Smokeless tobacco: Never Used  Substance Use Topics  . Alcohol use: No  . Drug use: No         OPHTHALMIC EXAM:  Base Eye Exam    Visual Acuity (Snellen - Linear)      Right Left   Dist [place] 20/40 20/200   Dist ph [place] 20/30 20/100       Tonometry (Tonopen, 9:52 AM)      Right Left   Pressure 13 05       Pupils      Dark Light Shape React APD   Right 4 3 Round Brisk None   Left 5  Irregular NR None       Neuro/Psych    Oriented x3:  Yes   Mood/Affect:  Normal       Dilation    Left eye:  1.0% Mydriacyl, 2.5% Phenylephrine @ 9:52 AM        Slit Lamp and Fundus Exam    Slit Lamp Exam      Right Left   Lids/Lashes Normal Normal   Conjunctiva/Sclera White and quiet sutures intact; SCH and Injection - improved   Cornea Arcus Arcus, 2+ Edema, 1-2+ descemet folds; nylon sutures temporally and inferiorly; wounds seidel negative; 2+ diffuse peripheral Punctate epithelial erosions with irregular epithelium - greatest inferiorly, dry tear film   Anterior Chamber Deep and quiet 2-3+ Cell and  RBC, Fibrin and pupillary membrane clearing; clotted hemes at 12 and 3 oclock - improving   Iris Round and dilated Irregular pupil with temporal iridectomy/iridodialysis; clotted heme at 12 and 3 shrinking   Lens 2-3+ Nuclear sclerosis, 2+ Cortical cataract PCIOL displaced superiorly; fibrin membrane overlying lens resolved   Vitreous Vitreous syneresis red reflex improving, Vitreous debris and hemorrhage clearing and settling inferiorly       Fundus Exam  Right Left   Disc  hazy view; perfused   C/D Ratio 0.5    Macula  Hazy view, grossly attached, heme overlying - improving   Vessels  Vascular attenuation   Periphery  red reflex, superior retina visible and attached          IMAGING AND PROCEDURES           ASSESSMENT/PLAN:    ICD-10-CM   1. 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 Vitreous hemorrhage of left eye (HCC) H43.12   3. 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 Endophthalmitis OS-  - presented on POD4 s/p phaco/PCIOL on Monday, 11.7.19 - VA HM OS w/ 0.66mm hypopyon, fibrin membrane and irregular pupil, and dense vitreous opacities on b-scan ultrasound - now POW4 s/p PPV + AC washout + intravitreal vanc, ceftaz, and cefepime, OS, on 1.11.19  - extensive AC and posterior debris / purulent material noted intra op  - gram stain and culture positive for coag negative staph  - repeat b-scan 2.4.19 with improved but persistent vitreous opacities  - today, persistent VH hazing posterior view but improved slightly today             - IOP 05 OS today; wounds remain seidel negative             - cont   PF q1h OS                         Atropine BID OS                         PSO ung QID OS             - keep head elevated             - eye shield when sleeping             - post op drop and positioning instructions reviewed             - tylenol/ibuprofen for pain - discussed findings, guarded prognosis and possible need for further treatment - f/u 1 wk  2.  Pseudophakia OS - s/p phaco/PCIOL on Monday, 11.7.19    Ophthalmic Meds Ordered this visit:  No orders of the defined types were placed in this encounter.      Return in about 6 days (around 03/27/2017) for S/P PPV + AC washout + intravit injxns OS (01.11.19).  There are no Patient Instructions on file for this visit.   Explained the diagnoses, plan, and follow up with the patient and they expressed understanding.  Patient expressed understanding of the importance of proper follow up care.   This document serves as a record of services personally performed by Karie Chimera, MD, PhD. It was created on their behalf by Virgilio Belling, COA, a certified ophthalmic assistant. The creation of this record is the provider's dictation and/or activities during the visit.  Electronically signed by: Virgilio Belling, COA  03/21/17 1:35 PM   Karie Chimera, M.D., Ph.D. Diseases & Surgery of the Retina and Vitreous [facility] 03/21/17   I have reviewed the above documentation for accuracy and completeness, and I agree with the above. Karie Chimera, M.D., Ph.D. 03/21/17 1:35 PM      Abbreviations: M myopia (nearsighted); A astigmatism; H hyperopia (farsighted); P presbyopia; Mrx spectacle prescription;  CTL contact lenses; OD right eye; OS left  eye; OU both eyes  XT exotropia; ET esotropia; PEK punctate epithelial keratitis; PEE punctate epithelial erosions; DES dry eye syndrome; MGD meibomian gland dysfunction; ATs artificial tears; PFAT's preservative free artificial tears; [place] nuclear sclerotic cataract; PSC posterior subcapsular cataract; ERM epi-retinal membrane; PVD posterior vitreous detachment; RD retinal detachment; DM diabetes mellitus; DR diabetic retinopathy; NPDR non-proliferative diabetic retinopathy; PDR proliferative diabetic retinopathy; CSME clinically significant macular edema; DME diabetic macular edema; dbh dot blot hemorrhages; CWS cotton wool  spot; POAG primary open angle glaucoma; C/D cup-to-disc ratio; HVF humphrey visual field; GVF goldmann visual field; OCT optical coherence tomography; IOP intraocular pressure; BRVO Branch retinal vein occlusion; CRVO central retinal vein occlusion; CRAO central retinal artery occlusion; BRAO branch retinal artery occlusion; RT retinal tear; SB scleral buckle; PPV pars plana vitrectomy; VH Vitreous hemorrhage; PRP panretinal laser photocoagulation; IVK intravitreal kenalog; VMT vitreomacular traction; MH Macular hole;  NVD neovascularization of the disc; NVE neovascularization elsewhere; AREDS age related eye disease study; ARMD age related macular degeneration; POAG primary open angle glaucoma; EBMD epithelial/anterior basement membrane dystrophy; ACIOL anterior chamber intraocular lens; IOL intraocular lens; PCIOL posterior chamber intraocular lens; Phaco/IOL phacoemulsification with intraocular lens placement; [place] photorefractive keratectomy; LASIK laser assisted in situ keratomileusis; HTN hypertension; DM diabetes mellitus; COPD chronic obstructive pulmonary disease

## 2017-03-21 ENCOUNTER — Encounter (INDEPENDENT_AMBULATORY_CARE_PROVIDER_SITE_OTHER): Payer: Self-pay | Admitting: Ophthalmology

## 2017-03-21 ENCOUNTER — Ambulatory Visit (INDEPENDENT_AMBULATORY_CARE_PROVIDER_SITE_OTHER): Payer: Medicare Other | Admitting: Ophthalmology

## 2017-03-21 DIAGNOSIS — H44002 Unspecified purulent endophthalmitis, left eye: Secondary | ICD-10-CM

## 2017-03-21 DIAGNOSIS — H4312 Vitreous hemorrhage, left eye: Secondary | ICD-10-CM

## 2017-03-21 DIAGNOSIS — Z961 Presence of intraocular lens: Secondary | ICD-10-CM

## 2017-03-26 NOTE — Progress Notes (Signed)
[facility] - Clinic Note  03/27/2017     CHIEF COMPLAINT Patient presents for Post-op Follow-up s/p phaco/PCIOL OS, 11.7.19, rg S/p PPV/AC washout/intravitreal vanc, ceftaz, cefepime OS, 11.11.19, bz  HISTORY OF PRESENT ILLNESS: Jennifer Estrada is a 72 y.o. female who presents to the clinic today for:   HPI    Post-op Follow-up    In left eye.  Discomfort includes pain and floaters.  Negative for itching, foreign body sensation, tearing, discharge and none.  Vision is stable.  I, the attending physician,  performed the HPI with the patient and updated documentation appropriately.          Comments    S/P PPV + AC Washout + Intravitreal injxns Os (02/21/17) Patient states "small floater are gone,but I see a lot of reddish streamers, they remind me of cob webs". Pt reports her eyes have become sensitive to light OU. Pt reports her eye feel less swollen Os. Pt is using Atropine Bid OS, Pred Forte gtts OS  q hr while awake, Polysporin onit QID OS.        Last edited by [name], MD on 03/27/2017 10:00 AM. (History)    Pt states she feels she is doing better; Pt states OS does not feel as swollen;   Referring physician: Merlene Laughter, MD [address] [place], [place] [address]  HISTORICAL INFORMATION:   Selected notes from the MEDICAL RECORD NUMBER Referred by Dr. Hortense Ramal for concern of endophthalmitis OS  LEE- 01.11.19  Ocular Hx- CE/toric IOL OS (01.07.19 - R. Groat) - VA 1 day PO OS 20/30, VA OS (01.11.19) HM PMH-     CURRENT MEDICATIONS: Current Outpatient Medications (Ophthalmic Drugs)  Medication Sig  . bacitracin-polymyxin b (POLYSPORIN) ophthalmic ointment Place into the left eye 4 (four) times daily. Place a 1/2 inch ribbon of ointment into the lower eyelid.  . prednisoLONE acetate (PRED FORTE) 1 % ophthalmic suspension Place 1 drop into the right eye every hour.   No current facility-administered medications for this visit.   (Ophthalmic Drugs)   Current Outpatient Medications (Other)  Medication Sig  . Calcium Carbonate-Vitamin D (CALCIUM + D PO) Take 1 tablet by mouth daily.  . chlorhexidine (PERIDEX) 0.12 % solution Use as directed 15 mLs in the mouth or throat daily as needed (sores).   Marland Kitchen dexlansoprazole (DEXILANT) 60 MG capsule Take 60 mg by mouth daily.  Marland Kitchen docusate sodium 100 MG CAPS Take 100 mg by mouth 2 (two) times daily.  . fexofenadine (ALLEGRA) 30 MG tablet Take 30 mg by mouth as needed (allergies).   Marland Kitchen PARoxetine (PAXIL) 20 MG tablet Take 20 mg by mouth every morning.  . propranolol ER (INDERAL LA) 80 MG 24 hr capsule Take 1 capsule (80 mg total) by mouth daily. (Patient taking differently: Take 60 mg by mouth daily. )  . rosuvastatin (CRESTOR) 5 MG tablet Take 5 mg by mouth daily.   No current facility-administered medications for this visit.  (Other)      REVIEW OF SYSTEMS: ROS    Positive for: Musculoskeletal, Eyes, Psychiatric   Negative for: Constitutional, Gastrointestinal, Neurological, Skin, Genitourinary, HENT, Endocrine, Cardiovascular, Respiratory, Allergic/Imm, Heme/Lymph   Last edited by Eldridge Scot, LPN on 07/16/5407  8:56 AM. (History)       ALLERGIES Allergies  Allergen Reactions  . Atorvastatin Other (See Comments)    Feels anxous  . Cortisone Other (See Comments)    headache  . Oxycodone Other (  See Comments)    confusion  . Paroxetine Other (See Comments)    anxiety  . Penicillins Other (See Comments)    Unknown per pt  . Pravastatin Other (See Comments)    muscle pain   . Singulair [Montelukast] Other (See Comments)    Chest pain  . Sulfa Antibiotics Nausea Only  . Aleve [Naproxen Sodium] Rash  . Augmentin [Amoxicillin-Pot Clavulanate] Rash  . Prednisone Other (See Comments)    headaches    PAST MEDICAL HISTORY Past Medical History:  Diagnosis Date  . Allergic rhinitis   . Chronic kidney disease   . Depression   . DJD (degenerative joint disease)  of knee   . GERD (gastroesophageal reflux disease)   . Hyperlipidemia   . Migraine    Takes Propranolol  . Neuromuscular disorder (HCC)    Body tremors at times,h/o falling cast right arm  . Osteoporosis   . Tremors of nervous system   . Vitamin D deficiency    Past Surgical History:  Procedure Laterality Date  . KNEE SURGERY    . Left Foot Surgery  20 years ago   20 years ago  . ORIF PATELLA Left 02/12/2014   Procedure: OPEN REDUCTION INTERNAL (ORIF) FIXATION LEFT PATELLA;  Surgeon: [name], MD;  Location: [facility];  Service: Orthopedics;  Laterality: Left;  . PARS PLANA VITRECTOMY Left 02/21/2017   Procedure: PARS PLANA VITRECTOMY 25 GAUGE FOR ENDOPHTHALMITIS;  Surgeon: [name], MD;  Location[facility];  Service: Ophthalmology;  Laterality: Left;  . WRIST FRACTURE SURGERY      FAMILY HISTORY History reviewed. No pertinent family history.  SOCIAL HISTORY Social History   Tobacco Use  . Smoking status: Never Smoker  . Smokeless tobacco: Never Used  Substance Use Topics  . Alcohol use: No  . Drug use: No         OPHTHALMIC EXAM:  Base Eye Exam    Visual Acuity (Snellen - Linear)      Right Left   Dist [place] 20/40 20/150   Dist ph [place] 20/30 20/100       Tonometry (Tonopen, 9:10 AM)      Right Left   Pressure 15 10       Pupils      Dark Light Shape React APD   Right 4 3 Round Brisk None   Left 5  Irregular NR None       Neuro/Psych    Oriented x3:  Yes   Mood/Affect:  Normal       Dilation    Both eyes:  1.0% Mydriacyl, Paremyd @ 9:11 AM        Slit Lamp and Fundus Exam    Slit Lamp Exam      Right Left   Lids/Lashes Normal Normal   Conjunctiva/Sclera White and quiet sutures intact; SCH and Injection - improved   Cornea Arcus Arcus, trace Edema, trace descemet folds; nylon sutures temporally and inferiorly; wounds seidel negative; 1+ PEE  - greatest inferiorly, dry tear film   Anterior Chamber Deep and quiet 1/2+ Cell and RBC, Fibrin and  pupillary membrane resolved; clotted hemes at 12 and 3 oclock resolved   Iris Round and dilated Irregular pupil with temporal iridectomy/iridodialysis   Lens 2-3+ Nuclear sclerosis, 2+ Cortical cataract PCIOL displaced superiorly, trace pigment on IOL   Vitreous Vitreous syneresis Vitreous debris and hemorrhage clearing and settling inferiorly       Fundus Exam      Right Left  Disc  hazy view; perfused   C/D Ratio 0.5    Macula  Hazy view, grossly attached, heme overlying - improving   Vessels  Vascular attenuation   Periphery  Grossly attached; inferior view obscured by heme and debris          IMAGING AND PROCEDURES           ASSESSMENT/PLAN:    ICD-10-CM   1. 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 Vitreous hemorrhage of left eye (HCC) H43.12   3. 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 Endophthalmitis OS-  - presented on POD4 s/p phaco/PCIOL on Monday, 11.7.19 - VA HM OS w/ 0.718mm hypopyon, fibrin membrane and irregular pupil, and dense vitreous opacities on b-scan ultrasound - now POW4 s/p PPV + AC washout + intravitreal vanc, ceftaz, and cefepime, OS, on 1.11.19  - extensive AC and posterior debris / purulent material noted intra op  - gram stain and culture positive for coag negative staph  - repeat b-scan 2.4.19 with improved but persistent vitreous opacities  - today, persistent VH hazing posterior view but improved view today             - IOP 10 OS today; wounds remain seidel negative             - dec    PF to q2hr OS                         Atropine BID OS-- D/C when bottle runs out                         PSO ung QID OS             - keep head elevated             - eye shield when sleeping             - post op drop and positioning instructions reviewed             - tylenol/ibuprofen for pain - discussed findings, guarded prognosis and possible need for further treatment - f/u 1 wk  2. Pseudophakia OS - s/p phaco/PCIOL on Monday,  11.7.19    Ophthalmic Meds Ordered this visit:  Meds ordered this encounter  Medications  . bacitracin-polymyxin b (POLYSPORIN) ophthalmic ointment    Sig: Place into the left eye 4 (four) times daily. Place a 1/2 inch ribbon of ointment into the lower eyelid.    Dispense:  3.5 g    Refill:  2       Return in about 1 week (around 04/03/2017) for S/P PPV+AC washout+IV injxns OS (01.11.19).  There are no Patient Instructions on file for this visit.   Explained the diagnoses, plan, and follow up with the patient and they expressed understanding.  Patient expressed understanding of the importance of proper follow up care.   This document serves as a record of services personally performed by Karie ChimeraBrian G. Caresse Sedivy, MD, PhD. It was created on their behalf by Virgilio BellingMeredith Fabian, COA, a certified ophthalmic assistant. The creation of this record is the provider's dictation and/or activities during the visit.  Electronically signed by: Virgilio BellingMeredith Fabian, COA  03/27/17 10:01 AM   Karie ChimeraBrian G. Ronika Kelson, M.D., Ph.D. Diseases & Surgery of the Retina and Vitreous [facility] 03/27/17   I have reviewed the above documentation for accuracy and completeness, and  I agree with the above. Karie Chimera, M.D., Ph.D. 03/27/17 10:02 AM    Abbreviations: M myopia (nearsighted); A astigmatism; H hyperopia (farsighted); P presbyopia; Mrx spectacle prescription;  CTL contact lenses; OD right eye; OS left eye; OU both eyes  XT exotropia; ET esotropia; PEK punctate epithelial keratitis; PEE punctate epithelial erosions; DES dry eye syndrome; MGD meibomian gland dysfunction; ATs artificial tears; PFAT's preservative free artificial tears; NSC nuclear sclerotic cataract; PSC posterior subcapsular cataract; ERM epi-retinal membrane; PVD posterior vitreous detachment; RD retinal detachment; DM diabetes mellitus; DR diabetic retinopathy; NPDR non-proliferative diabetic retinopathy; PDR proliferative  diabetic retinopathy; CSME clinically significant macular edema; DME diabetic macular edema; dbh dot blot hemorrhages; CWS cotton wool spot; POAG primary open angle glaucoma; C/D cup-to-disc ratio; HVF humphrey visual field; GVF goldmann visual field; OCT optical coherence tomography; IOP intraocular pressure; BRVO Branch retinal vein occlusion; CRVO central retinal vein occlusion; CRAO central retinal artery occlusion; BRAO branch retinal artery occlusion; RT retinal tear; SB scleral buckle; PPV pars plana vitrectomy; VH Vitreous hemorrhage; PRP panretinal laser photocoagulation; IVK intravitreal kenalog; VMT vitreomacular traction; MH Macular hole;  NVD neovascularization of the disc; NVE neovascularization elsewhere; AREDS age related eye disease study; ARMD age related macular degeneration; POAG primary open angle glaucoma; EBMD epithelial/anterior basement membrane dystrophy; ACIOL anterior chamber intraocular lens; IOL intraocular lens; PCIOL posterior chamber intraocular lens; Phaco/IOL phacoemulsification with intraocular lens placement; PRK photorefractive keratectomy; LASIK laser assisted in situ keratomileusis; HTN hypertension; DM diabetes mellitus; COPD chronic obstructive pulmonary disease

## 2017-03-27 ENCOUNTER — Ambulatory Visit (INDEPENDENT_AMBULATORY_CARE_PROVIDER_SITE_OTHER): Payer: Medicare Other | Admitting: Ophthalmology

## 2017-03-27 ENCOUNTER — Encounter (INDEPENDENT_AMBULATORY_CARE_PROVIDER_SITE_OTHER): Payer: Self-pay | Admitting: Ophthalmology

## 2017-03-27 ENCOUNTER — Other Ambulatory Visit (INDEPENDENT_AMBULATORY_CARE_PROVIDER_SITE_OTHER): Payer: Self-pay | Admitting: Ophthalmology

## 2017-03-27 DIAGNOSIS — H44002 Unspecified purulent endophthalmitis, left eye: Secondary | ICD-10-CM

## 2017-03-27 DIAGNOSIS — Z961 Presence of intraocular lens: Secondary | ICD-10-CM

## 2017-03-27 DIAGNOSIS — H4312 Vitreous hemorrhage, left eye: Secondary | ICD-10-CM

## 2017-03-27 MED ORDER — BACITRACIN-POLYMYXIN B 500-10000 UNIT/GM OP OINT: TOPICAL_OINTMENT | Freq: Four times a day (QID) | OPHTHALMIC | 2 refills | Status: DC

## 2017-04-02 NOTE — Progress Notes (Addendum)
[facility] - Clinic Note  04/03/2017     CHIEF COMPLAINT Patient presents for Retina Follow Up s/p phaco/PCIOL OS, 1.7.19, rg S/p PPV/AC washout/intravitreal vanc, ceftaz, cefepime OS, 1.11.19, bz  HISTORY OF PRESENT ILLNESS: Jennifer Estrada is a 72 y.o. female who presents to the clinic today for:   HPI    Retina Follow Up    Patient presents with  Other.  In left eye.  Severity is mild.  Since onset it is stable.  I, the attending physician,  performed the HPI with the patient and updated documentation appropriately.          Comments    S/P PPV/AC washout+IV injxns OS (02/21/17). Patient states she still has the "reddish streamers"OS. Pt reports seeing "cob web" upper left visual field. 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 Pt is using Pred forte gtt's, Polysporin .       Last edited by [name], MD on 04/03/2017  9:33 AM. (History)    Pt states she feels she is doing better; Pt states OS does not feel as swollen;   Referring physician: Merlene Laughter, MD [address] [place], [place] [address]  HISTORICAL INFORMATION:   Selected notes from the MEDICAL RECORD NUMBER Referred by Dr. Hortense Ramal for concern of endophthalmitis OS  LEE- 01.11.19  Ocular Hx- CE/toric IOL OS (01.07.19 - R. Groat) - VA 1 day PO OS 20/30, VA OS (01.11.19) HM PMH-     CURRENT MEDICATIONS: Current Outpatient Medications (Ophthalmic Drugs)  Medication Sig  . bacitracin-polymyxin b (POLYSPORIN) ophthalmic ointment Place into the left eye at bedtime. Place a 1/2 inch ribbon of ointment into the lower eyelid. (Patient not taking: Reported on 06/18/2017)  . prednisoLONE acetate (PRED FORTE) 1 % ophthalmic suspension Place 1 drop into the left eye 2 (two) times daily.   No current facility-administered medications for this visit.  (Ophthalmic Drugs)   Current Outpatient Medications (Other)  Medication  Sig  . Calcium Carbonate-Vitamin D (CALCIUM + D PO) Take 1 tablet by mouth daily.  . chlorhexidine (PERIDEX) 0.12 % solution Use as directed 15 mLs in the mouth or throat daily as needed (sores).   Marland Kitchen dexlansoprazole (DEXILANT) 60 MG capsule Take 60 mg by mouth daily.  Marland Kitchen docusate sodium 100 MG CAPS Take 100 mg by mouth 2 (two) times daily.  . fexofenadine (ALLEGRA) 30 MG tablet Take 30 mg by mouth as needed (allergies).   Marland Kitchen PARoxetine (PAXIL) 20 MG tablet Take 20 mg by mouth every morning.  . propranolol ER (INDERAL LA) 80 MG 24 hr capsule Take 1 capsule (80 mg total) by mouth daily. (Patient taking differently: Take 60 mg by mouth daily. )  . rosuvastatin (CRESTOR) 5 MG tablet Take 5 mg by mouth daily.  . Vitamin D, Ergocalciferol, (DRISDOL) 50000 units CAPS capsule    No current facility-administered medications for this visit.  (Other)      REVIEW OF SYSTEMS: ROS    Positive for: Eyes   Negative for: Constitutional, Gastrointestinal, Neurological, Skin, Genitourinary, Musculoskeletal, HENT, Endocrine, Cardiovascular, Respiratory, Psychiatric, Allergic/Imm, Heme/Lymph   Last edited by Eldridge Scot, LPN on 07/16/5407  9:17 AM. (History)       ALLERGIES Allergies  Allergen Reactions  . Atorvastatin Other (See Comments)    Feels anxous  . Cortisone Other (See Comments)    headache  . Oxycodone Other (  See Comments)    confusion  . Paroxetine Other (See Comments)    anxiety  . Penicillins Other (See Comments)    Unknown per pt  . Pravastatin Other (See Comments)    muscle pain   . Singulair [Montelukast] Other (See Comments)    Chest pain  . Sulfa Antibiotics Nausea Only  . Aleve [Naproxen Sodium] Rash  . Augmentin [Amoxicillin-Pot Clavulanate] Rash  . Prednisone Other (See Comments)    headaches    PAST MEDICAL HISTORY Past Medical History:  Diagnosis Date  . Allergic rhinitis   . Chronic kidney disease   . Depression   . DJD (degenerative joint disease) of knee    . GERD (gastroesophageal reflux disease)   . Hyperlipidemia   . Migraine    Takes Propranolol  . Neuromuscular disorder (HCC)    Body tremors at times,h/o falling cast right arm  . Osteoporosis   . Tremors of nervous system   . Vitamin D deficiency    Past Surgical History:  Procedure Laterality Date  . KNEE SURGERY    . Left Foot Surgery  20 years ago   20 years ago  . ORIF PATELLA Left 02/12/2014   Procedure: OPEN REDUCTION INTERNAL (ORIF) FIXATION LEFT PATELLA;  Surgeon: Shelda PalMatthew D Olin, MD;  Location: [facility];  Service: Orthopedics;  Laterality: Left;  . PARS PLANA VITRECTOMY Left 02/21/2017   Procedure: PARS PLANA VITRECTOMY 25 GAUGE FOR ENDOPHTHALMITIS;  Surgeon: Rennis ChrisZamora, Nieves Barberi, MD;  [facility];  Service: Ophthalmology;  Laterality: Left;  . WRIST FRACTURE SURGERY      FAMILY HISTORY History reviewed. No pertinent family history.  SOCIAL HISTORY Social History   Tobacco Use  . Smoking status: Never Smoker  . Smokeless tobacco: Never Used  Substance Use Topics  . Alcohol use: No  . Drug use: No         OPHTHALMIC EXAM:  Base Eye Exam    Visual Acuity (Snellen - Linear)      Right Left   Dist [place] 20/40 +2 20/70 +2   Dist ph [place] 20/30 +1 20/60 -2       Tonometry (Tonopen, 9:26 AM)      Right Left   Pressure 14 14       Pupils      Dark Light Shape React APD   Right 3 2 Round Brisk None   Left 5 5 Irregular NR None       Visual Fields (Counting fingers)      Left Right    Full Full       Extraocular Movement      Right Left    Full, Ortho Full, Ortho       Neuro/Psych    Oriented x3:  Yes   Mood/Affect:  Normal       Dilation    Both eyes:  1.0% Mydriacyl, 2.5% Phenylephrine @ 9:26 AM        Slit Lamp and Fundus Exam    Slit Lamp Exam      Right Left   Lids/Lashes Normal Normal   Conjunctiva/Sclera White and quiet sutures intact; SCH and Injection - improved   Cornea Arcus, 2+ Punctate epithelial erosions, irregular tear film and  epi surface Arcus, trace Edema, trace descemet folds; nylon sutures temporally and inferiorly; wounds seidel negative; 3+ PEE  - greatest inferiorly, dry tear film, irregular surface inferiorly   Anterior Chamber Deep and quiet 1/2+ Cell and RBC, Fibrin and pupillary membrane resolved; clotted  hemes at 12 and 3 oclock resolved   Iris Round and dilated Irregular pupil with temporal iridectomy/iridodialysis   Lens 2-3+ Nuclear sclerosis, 2+ Cortical cataract PCIOL displaced superiorly, trace pigment on IOL   Vitreous Vitreous syneresis Vitreous debris and hemorrhage clearing and settling inferiorly       Fundus Exam      Right Left   Disc sharp hazy view - improved; perfused, anomalous disc, PPA   C/D Ratio 0.5    Macula Flat, Retinal pigment epithelial mottling, No heme or edema Hazy view - improving, grossly attached   Vessels Normal Vascular attenuation   Periphery Attached Grossly attached; inferior view obscured by heme and debris          IMAGING AND PROCEDURES           ASSESSMENT/PLAN:    ICD-10-CM   1. 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 Vitreous hemorrhage of left eye (HCC) H43.12   3. Pseudophakia, left eye Z96.1   4. Retinal edema H35.81 OCT, Retina - OU - Both Eyes    1. Endophthalmitis OS-  - presented on POD4 s/p phaco/PCIOL on Monday, 1.7.19 - VA HM OS w/ 0.46mm hypopyon, fibrin membrane and irregular pupil, and dense vitreous opacities on b-scan ultrasound - now POW6 s/p PPV + AC washout + intravitreal vanc, ceftaz, and cefepime, OS, on 1.11.19  - extensive AC and posterior debris / purulent material noted intra op  - gram stain and culture positive for coag negative staph  - repeat b-scan 2.4.19 with improved but persistent vitreous opacities  - today, vastly improved VA with vit debris and heme settling inferiorly             - IOP 14 OS today; wounds remain seidel negative             - cont   PF to q2hr OS                          PSO ung qhs-prn OS             - keep head elevated             - eye shield when sleeping             - post op drop and positioning instructions reviewed             - tylenol/ibuprofen for pain - discussed findings, guarded prognosis and possible need for further treatment - f/u 10 days   2. Pseudophakia OS - s/p phaco/PCIOL on Monday, 11.7.19    Ophthalmic Meds Ordered this visit:  No orders of the defined types were placed in this encounter.       Return in about 10 days (around 04/13/2017) for S/P PPV + AC washout + IV injxns OS (01.11.19).  There are no Patient Instructions on file for this visit.   Explained the diagnoses, plan, and follow up with the patient and they expressed understanding.  Patient expressed understanding of the importance of proper follow up care.   This document serves as a record of services personally performed by Karie Chimera, MD, PhD. It was created on their behalf by Virgilio Belling, COA, a certified ophthalmic assistant. The creation of this record is the provider's dictation and/or activities during the visit.  Electronically signed by: Virgilio Belling, COA  06/24/17 10:45 AM   Karie Chimera, M.D., Ph.D. Diseases &  Surgery of the Retina and Vitreous [facility] 06/24/17   I have reviewed the above documentation for accuracy and completeness, and I agree with the above. Karie Chimera, M.D., Ph.D. 06/24/17 10:45 AM    Abbreviations: M myopia (nearsighted); A astigmatism; H hyperopia (farsighted); P presbyopia; Mrx spectacle prescription;  CTL contact lenses; OD right eye; OS left eye; OU both eyes  XT exotropia; ET esotropia; PEK punctate epithelial keratitis; PEE punctate epithelial erosions; DES dry eye syndrome; MGD meibomian gland dysfunction; ATs artificial tears; PFAT's preservative free artificial tears; NSC nuclear sclerotic cataract; PSC posterior subcapsular cataract; ERM epi-retinal  membrane; PVD posterior vitreous detachment; RD retinal detachment; DM diabetes mellitus; DR diabetic retinopathy; NPDR non-proliferative diabetic retinopathy; PDR proliferative diabetic retinopathy; CSME clinically significant macular edema; DME diabetic macular edema; dbh dot blot hemorrhages; CWS cotton wool spot; POAG primary open angle glaucoma; C/D cup-to-disc ratio; HVF humphrey visual field; GVF goldmann visual field; OCT optical coherence tomography; IOP intraocular pressure; BRVO Branch retinal vein occlusion; CRVO central retinal vein occlusion; CRAO central retinal artery occlusion; BRAO branch retinal artery occlusion; RT retinal tear; SB scleral buckle; PPV pars plana vitrectomy; VH Vitreous hemorrhage; PRP panretinal laser photocoagulation; IVK intravitreal kenalog; VMT vitreomacular traction; MH Macular hole;  NVD neovascularization of the disc; NVE neovascularization elsewhere; AREDS age related eye disease study; ARMD age related macular degeneration; POAG primary open angle glaucoma; EBMD epithelial/anterior basement membrane dystrophy; ACIOL anterior chamber intraocular lens; IOL intraocular lens; PCIOL posterior chamber intraocular lens; Phaco/IOL phacoemulsification with intraocular lens placement; PRK photorefractive keratectomy; LASIK laser assisted in situ keratomileusis; HTN hypertension; DM diabetes mellitus; COPD chronic obstructive pulmonary disease

## 2017-04-03 ENCOUNTER — Ambulatory Visit (INDEPENDENT_AMBULATORY_CARE_PROVIDER_SITE_OTHER): Payer: Medicare Other | Admitting: Ophthalmology

## 2017-04-03 ENCOUNTER — Encounter (INDEPENDENT_AMBULATORY_CARE_PROVIDER_SITE_OTHER): Payer: Self-pay | Admitting: Ophthalmology

## 2017-04-03 DIAGNOSIS — H3581 Retinal edema: Secondary | ICD-10-CM

## 2017-04-03 DIAGNOSIS — H4312 Vitreous hemorrhage, left eye: Secondary | ICD-10-CM

## 2017-04-03 DIAGNOSIS — H44002 Unspecified purulent endophthalmitis, left eye: Secondary | ICD-10-CM

## 2017-04-03 DIAGNOSIS — Z961 Presence of intraocular lens: Secondary | ICD-10-CM

## 2017-04-14 NOTE — Progress Notes (Addendum)
[facility] - Clinic Note  04/15/2017     CHIEF COMPLAINT Patient presents for Post-op Follow-up s/p phaco/PCIOL OS, 1.7.19, rg S/p PPV/AC washout/intravitreal vanc, ceftaz, cefepime OS, 1.11.19, bz  HISTORY OF PRESENT ILLNESS: Jennifer Estrada is a 72 y.o. female who presents to the clinic today for:   HPI    Post-op Follow-up    In right eye.  Discomfort includes floaters.  Negative for pain, itching, foreign body sensation, tearing, discharge and none.  Vision is stable.  I, the attending physician,  performed the HPI with the patient and updated documentation appropriately.          Comments    S/P PPV/AC washout/intravitreal vanc, ceftaz, cefepime OS (01.11.19); Pt states she continues to see floaters OS, states there "are not as many"; Pt reports seeing "cob webs"; Pt states she has been seeing "christmas lights, some are flashing"; Pt states OS is slightly uncomfortable; Pt states she is using PF q2hr OS, PSO ung OS qhs;        Last edited by [name], MD on 04/15/2017  9:30 AM. (History)      Referring physician: Merlene Laughter, MD [address] [place], [place] [address]  HISTORICAL INFORMATION:   Selected notes from the MEDICAL RECORD NUMBER Referred by Dr. Hortense Ramal for concern of endophthalmitis OS  LEE- 01.11.19  Ocular Hx- CE/toric IOL OS (01.07.19 - R. Groat) - VA 1 day PO OS 20/30, VA OS (01.11.19) HM PMH-     CURRENT MEDICATIONS: Current Outpatient Medications (Ophthalmic Drugs)  Medication Sig  . bacitracin-polymyxin b (POLYSPORIN) ophthalmic ointment Place into the left eye at bedtime. Place a 1/2 inch ribbon of ointment into the lower eyelid. (Patient not taking: Reported on 06/18/2017)  . prednisoLONE acetate (PRED FORTE) 1 % ophthalmic suspension Place 1 drop into the left eye 2 (two) times daily.   No current facility-administered medications for this visit.  (Ophthalmic Drugs)   Current Outpatient Medications  (Other)  Medication Sig  . Calcium Carbonate-Vitamin D (CALCIUM + D PO) Take 1 tablet by mouth daily.  . chlorhexidine (PERIDEX) 0.12 % solution Use as directed 15 mLs in the mouth or throat daily as needed (sores).   Marland Kitchen dexlansoprazole (DEXILANT) 60 MG capsule Take 60 mg by mouth daily.  Marland Kitchen docusate sodium 100 MG CAPS Take 100 mg by mouth 2 (two) times daily.  . fexofenadine (ALLEGRA) 30 MG tablet Take 30 mg by mouth as needed (allergies).   Marland Kitchen PARoxetine (PAXIL) 20 MG tablet Take 20 mg by mouth every morning.  . propranolol ER (INDERAL LA) 80 MG 24 hr capsule Take 1 capsule (80 mg total) by mouth daily. (Patient taking differently: Take 60 mg by mouth daily. )  . rosuvastatin (CRESTOR) 5 MG tablet Take 5 mg by mouth daily.  . Vitamin D, Ergocalciferol, (DRISDOL) 50000 units CAPS capsule    No current facility-administered medications for this visit.  (Other)      REVIEW OF SYSTEMS: ROS    Positive for: Musculoskeletal, Eyes   Negative for: Constitutional, Gastrointestinal, Neurological, Skin, Genitourinary, HENT, Endocrine, Cardiovascular, Respiratory, Psychiatric, Allergic/Imm, Heme/Lymph   Last edited by Murtis Sink, COA on 04/15/2017  9:18 AM. (History)       ALLERGIES Allergies  Allergen Reactions  . Atorvastatin Other (See Comments)    Feels anxous  . Cortisone Other (See Comments)    headache  . Oxycodone Other (See Comments)    confusion  . Paroxetine  Other (See Comments)    anxiety  . Penicillins Other (See Comments)    Unknown per pt  . Pravastatin Other (See Comments)    muscle pain   . Singulair [Montelukast] Other (See Comments)    Chest pain  . Sulfa Antibiotics Nausea Only  . Aleve [Naproxen Sodium] Rash  . Augmentin [Amoxicillin-Pot Clavulanate] Rash  . Prednisone Other (See Comments)    headaches    PAST MEDICAL HISTORY Past Medical History:  Diagnosis Date  . Allergic rhinitis   . Chronic kidney disease   . Depression   . DJD (degenerative  joint disease) of knee   . GERD (gastroesophageal reflux disease)   . Hyperlipidemia   . Migraine    Takes Propranolol  . Neuromuscular disorder (HCC)    Body tremors at times,h/o falling cast right arm  . Osteoporosis   . Tremors of nervous system   . Vitamin D deficiency    Past Surgical History:  Procedure Laterality Date  . KNEE SURGERY    . Left Foot Surgery  20 years ago   20 years ago  . ORIF PATELLA Left 02/12/2014   Procedure: OPEN REDUCTION INTERNAL (ORIF) FIXATION LEFT PATELLA;  Surgeon: Shelda PalMatthew D Olin, MD;  Location: [facility];  Service: Orthopedics;  Laterality: Left;  . PARS PLANA VITRECTOMY Left 02/21/2017   Procedure: PARS PLANA VITRECTOMY 25 GAUGE FOR ENDOPHTHALMITIS;  Surgeon: [place] ChrisZamora, Lesli Issa, MD;  [facility];  Service: Ophthalmology;  Laterality: Left;  . WRIST FRACTURE SURGERY      FAMILY HISTORY History reviewed. No pertinent family history.  SOCIAL HISTORY Social History   Tobacco Use  . Smoking status: Never Smoker  . Smokeless tobacco: Never Used  Substance Use Topics  . Alcohol use: No  . Drug use: No         OPHTHALMIC EXAM:  Base Eye Exam    Visual Acuity (Snellen - Linear)      Right Left   Dist cc 20/40 20/60   Dist ph cc 20/30 20/60 +2   Correction:  Glasses       Tonometry (Tonopen, 9:33 AM)      Right Left   Pressure 13 12       Pupils      Dark Light Shape React APD   Right 4 3 Round Slow None   Left 7  Irregular NR None       Visual Fields (Counting fingers)      Left Right    Full Full       Extraocular Movement      Right Left    Full, Ortho Full, Ortho       Neuro/Psych    Oriented x3:  Yes   Mood/Affect:  Normal       Dilation    Left eye:  1.0% Mydriacyl, 2.5% Phenylephrine @ 9:33 AM        Slit Lamp and Fundus Exam    Slit Lamp Exam      Right Left   Lids/Lashes Normal Normal   Conjunctiva/Sclera White and quiet sutures intact; SCH and Injection - improved   Cornea Arcus, 2+ Punctate epithelial  erosions, irregular tear film and epi surface Arcus, trace Edema, trace descemet folds; nylon sutures temporally and inferiorly; wounds seidel negative; 2-3+ PEE  - greatest inferiorly, dry tear film, irregular surface inferiorly   Anterior Chamber Deep and quiet 0.5+ cell / pigment, Fibrin and pupillary membrane resolved; clotted hemes at 12 and 3 oclock resolved  Iris Round and dilated Irregular pupil with temporal iridectomy/iridodialysis   Lens 2-3+ Nuclear sclerosis, 2+ Cortical cataract PCIOL displaced superiorly, trace pigment on IOL   Vitreous Vitreous syneresis Vitreous debris and hemorrhage clearing and settling inferiorly       Fundus Exam      Right Left   Disc  hazy view - improved; perfused, anomalous disc, PPA   C/D Ratio 0.5    Macula  Hazy view - improving, grossly attached   Vessels  Vascular attenuation   Periphery  Grossly attached; inferior view obscured by heme and debris          IMAGING AND PROCEDURES           ASSESSMENT/PLAN:    ICD-10-CM   1. 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 Vitreous hemorrhage of left eye (HCC) H43.12   3. Pseudophakia, left eye Z96.1   4. Retinal edema H35.81 OCT, Retina - OU - Both Eyes    1. Endophthalmitis OS-  - presented on POD4 s/p phaco/PCIOL on Monday, 1.7.19 - VA HM OS w/ 0.62mm hypopyon, fibrin membrane and irregular pupil, and dense vitreous opacities on b-scan ultrasound - now POW8 s/p PPV + AC washout + intravitreal vanc, ceftaz, and cefepime, OS, on 1.11.19  - extensive AC and posterior debris / purulent material noted intra op  - gram stain and culture positive for coag negative staph  - repeat b-scan 2.4.19 with improved but persistent vitreous opacities  - today, vastly improved VA with vit debris and heme settling inferiorly             - IOP 12 OS today; wounds remain seidel negative  - inferior nylon suture removed at slit lamp with betadine and polymixin drops post removal  -- no complications  - inc PSO to QID OS x7 days             - dec   PF to 6x/daily OS             - keep head elevated             - eye shield when sleeping             - post op drop and positioning instructions reviewed             - tylenol/ibuprofen for pain - f/u 10-14 days   2. Pseudophakia OS - s/p phaco/PCIOL on Monday, 11.7.19    Ophthalmic Meds Ordered this visit:  No orders of the defined types were placed in this encounter.       Return for 10-14 days, POV.  There are no Patient Instructions on file for this visit.   Explained the diagnoses, plan, and follow up with the patient and they expressed understanding.  Patient expressed understanding of the importance of proper follow up care.   This document serves as a record of services personally performed by Karie Chimera, MD, PhD. It was created on their behalf by Virgilio Belling, COA, a certified ophthalmic assistant. The creation of this record is the provider's dictation and/or activities during the visit.  Electronically signed by: Virgilio Belling, COA  06/24/17 10:47 AM   Karie Chimera, M.D., Ph.D. Diseases & Surgery of the Retina and Vitreous [facility] 06/24/17   I have reviewed the above documentation for accuracy and completeness, and I agree with the above. Karie Chimera, M.D., Ph.D. 06/24/17 10:47 AM  Abbreviations: M myopia (nearsighted); A astigmatism; H hyperopia (farsighted); P presbyopia; Mrx spectacle prescription;  CTL contact lenses; OD right eye; OS left eye; OU both eyes  XT exotropia; ET esotropia; PEK punctate epithelial keratitis; PEE punctate epithelial erosions; DES dry eye syndrome; MGD meibomian gland dysfunction; ATs artificial tears; PFAT's preservative free artificial tears; [place] nuclear sclerotic cataract; PSC posterior subcapsular cataract; ERM epi-retinal membrane; PVD posterior vitreous detachment; RD retinal detachment; DM diabetes  mellitus; DR diabetic retinopathy; NPDR non-proliferative diabetic retinopathy; PDR proliferative diabetic retinopathy; CSME clinically significant macular edema; DME diabetic macular edema; dbh dot blot hemorrhages; CWS cotton wool spot; POAG primary open angle glaucoma; C/D cup-to-disc ratio; HVF humphrey visual field; GVF goldmann visual field; OCT optical coherence tomography; IOP intraocular pressure; BRVO Branch retinal vein occlusion; CRVO central retinal vein occlusion; CRAO central retinal artery occlusion; BRAO branch retinal artery occlusion; RT retinal tear; SB scleral buckle; PPV pars plana vitrectomy; VH Vitreous hemorrhage; PRP panretinal laser photocoagulation; IVK intravitreal kenalog; VMT vitreomacular traction; MH Macular hole;  NVD neovascularization of the disc; NVE neovascularization elsewhere; AREDS age related eye disease study; ARMD age related macular degeneration; POAG primary open angle glaucoma; EBMD epithelial/anterior basement membrane dystrophy; ACIOL anterior chamber intraocular lens; IOL intraocular lens; PCIOL posterior chamber intraocular lens; Phaco/IOL phacoemulsification with intraocular lens placement; [place] photorefractive keratectomy; LASIK laser assisted in situ keratomileusis; HTN hypertension; DM diabetes mellitus; COPD chronic obstructive pulmonary disease

## 2017-04-15 ENCOUNTER — Ambulatory Visit (INDEPENDENT_AMBULATORY_CARE_PROVIDER_SITE_OTHER): Payer: Medicare Other | Admitting: Ophthalmology

## 2017-04-15 ENCOUNTER — Encounter (INDEPENDENT_AMBULATORY_CARE_PROVIDER_SITE_OTHER): Payer: Self-pay | Admitting: Ophthalmology

## 2017-04-15 DIAGNOSIS — Z961 Presence of intraocular lens: Secondary | ICD-10-CM

## 2017-04-15 DIAGNOSIS — H3581 Retinal edema: Secondary | ICD-10-CM

## 2017-04-15 DIAGNOSIS — H4312 Vitreous hemorrhage, left eye: Secondary | ICD-10-CM

## 2017-04-15 DIAGNOSIS — H44002 Unspecified purulent endophthalmitis, left eye: Secondary | ICD-10-CM

## 2017-04-24 NOTE — Progress Notes (Addendum)
[facility] - Clinic Note  04/25/2017     CHIEF COMPLAINT Patient presents for Retina Follow Up s/p phaco/PCIOL OS, 1.7.19, rg S/p PPV/AC washout/intravitreal vanc, ceftaz, cefepime OS, 1.11.19, bz  HISTORY OF PRESENT ILLNESS: Jennifer Estrada is a 72 y.o. female who presents to the clinic today for:   HPI    Retina Follow Up    Patient presents with  Other.  In left eye.  This started 2 months ago.  Severity is mild.  Since onset it is stable.  I, the attending physician,  performed the HPI with the patient and updated documentation appropriately.          Comments    F/U endophthalmitis OS. Patient states she has one occasional floater and "film" like covering her eye OS. Vision is stable.Patient continues to use gtt's as instructed.        Last edited by [name], MD on 04/25/2017 10:46 AM. (History)      Referring physician: Ernesto Rutherford, MD [address] [place], [place] [address]  HISTORICAL INFORMATION:   Selected notes from the MEDICAL RECORD NUMBER Referred by Dr. Hortense Ramal for concern of endophthalmitis OS  LEE- 01.11.19  Ocular Hx- CE/toric IOL OS (01.07.19 - R. Groat) - VA 1 day PO OS 20/30, VA OS (01.11.19) HM PMH-     CURRENT MEDICATIONS: Current Outpatient Medications (Ophthalmic Drugs)  Medication Sig  . bacitracin-polymyxin b (POLYSPORIN) ophthalmic ointment Place into the left eye at bedtime. Place a 1/2 inch ribbon of ointment into the lower eyelid. (Patient not taking: Reported on 06/18/2017)  . prednisoLONE acetate (PRED FORTE) 1 % ophthalmic suspension Place 1 drop into the left eye 2 (two) times daily.   No current facility-administered medications for this visit.  (Ophthalmic Drugs)   Current Outpatient Medications (Other)  Medication Sig  . Calcium Carbonate-Vitamin D (CALCIUM + D PO) Take 1 tablet by mouth daily.  . chlorhexidine (PERIDEX) 0.12 % solution Use as directed 15 mLs in the mouth or throat daily as needed  (sores).   Marland Kitchen dexlansoprazole (DEXILANT) 60 MG capsule Take 60 mg by mouth daily.  Marland Kitchen docusate sodium 100 MG CAPS Take 100 mg by mouth 2 (two) times daily.  . fexofenadine (ALLEGRA) 30 MG tablet Take 30 mg by mouth as needed (allergies).   Marland Kitchen PARoxetine (PAXIL) 20 MG tablet Take 20 mg by mouth every morning.  . propranolol ER (INDERAL LA) 80 MG 24 hr capsule Take 1 capsule (80 mg total) by mouth daily. (Patient taking differently: Take 60 mg by mouth daily. )  . rosuvastatin (CRESTOR) 5 MG tablet Take 5 mg by mouth daily.  . Vitamin D, Ergocalciferol, (DRISDOL) 50000 units CAPS capsule    No current facility-administered medications for this visit.  (Other)      REVIEW OF SYSTEMS: ROS    Positive for: Eyes   Negative for: Constitutional, Gastrointestinal, Neurological, Skin, Genitourinary, Musculoskeletal, HENT, Endocrine, Cardiovascular, Respiratory, Psychiatric, Allergic/Imm, Heme/Lymph   Last edited by Eldridge Scot, LPN on 5/78/4696  8:58 AM. (History)       ALLERGIES Allergies  Allergen Reactions  . Atorvastatin Other (See Comments)    Feels anxous  . Cortisone Other (See Comments)    headache  . Oxycodone Other (See Comments)    confusion  . Paroxetine Other (See Comments)    anxiety  . Penicillins Other (See Comments)    Unknown per pt  . Pravastatin Other (See Comments)  muscle pain   . Singulair [Montelukast] Other (See Comments)    Chest pain  . Sulfa Antibiotics Nausea Only  . Aleve [Naproxen Sodium] Rash  . Augmentin [Amoxicillin-Pot Clavulanate] Rash  . Prednisone Other (See Comments)    headaches    PAST MEDICAL HISTORY Past Medical History:  Diagnosis Date  . Allergic rhinitis   . Chronic kidney disease   . Depression   . DJD (degenerative joint disease) of knee   . GERD (gastroesophageal reflux disease)   . Hyperlipidemia   . Migraine    Takes Propranolol  . Neuromuscular disorder (HCC)    Body tremors at times,h/o falling cast right arm   . Osteoporosis   . Tremors of nervous system   . Vitamin D deficiency    Past Surgical History:  Procedure Laterality Date  . KNEE SURGERY    . Left Foot Surgery  20 years ago   20 years ago  . ORIF PATELLA Left 02/12/2014   Procedure: OPEN REDUCTION INTERNAL (ORIF) FIXATION LEFT PATELLA;  Surgeon: [name], MD;  Location: [facility];  Service: Orthopedics;  Laterality: Left;  . PARS PLANA VITRECTOMY Left 02/21/2017   Procedure: PARS PLANA VITRECTOMY 25 GAUGE FOR ENDOPHTHALMITIS;  Surgeon: [name], MD;  Location: [facility];  Service: Ophthalmology;  Laterality: Left;  . WRIST FRACTURE SURGERY      FAMILY HISTORY History reviewed. No pertinent family history.  SOCIAL HISTORY Social History   Tobacco Use  . Smoking status: Never Smoker  . Smokeless tobacco: Never Used  Substance Use Topics  . Alcohol use: No  . Drug use: No         OPHTHALMIC EXAM:  Base Eye Exam    Visual Acuity (Snellen - Linear)      Right Left   Dist cc 20/60 -1 20/80 -2   Dist ph cc 20/40 20/50 -2   Correction:  Glasses       Tonometry (Tonopen, 9:24 AM)      Right Left   Pressure 14 14       Pupils      Dark Light Shape React APD   Right 3 1.5 Round Slow None   Left 6 6 Round NR None       Visual Fields (Counting fingers)      Left Right    Full Full       Extraocular Movement      Right Left    Full, Ortho Full, Ortho       Neuro/Psych    Oriented x3:  Yes   Mood/Affect:  Normal       Dilation    Both eyes:  1.0% Mydriacyl, 2.5% Phenylephrine @ 9:24 AM        Slit Lamp and Fundus Exam    Slit Lamp Exam      Right Left   Lids/Lashes Normal Normal   Conjunctiva/Sclera White and quiet White and quiet   Cornea Arcus, 2+ inferior Punctate epithelial erosions, irregular tear film and epi surface Arcus, trace Edema, trace descemet folds; nylon sutures temporally and inferiorly; wounds seidel negative; 1-2+ PEE  - greatest inferiorly, dry tear film, irregular surface  inferiorly   Anterior Chamber Deep and quiet 0.5+ cell / pigment, Fibrin and pupillary membrane resolved; clotted hemes at 12 and 3 oclock resolved   Iris Round and dilated Irregular pupil with temporal iridectomy/iridodialysis   Lens 2-3+ Nuclear sclerosis, 2+ Cortical cataract PCIOL displaced superiorly, trace pigment on IOL  Vitreous Vitreous syneresis Vitreous debris and hemorrhage clearing and settling inferiorly--vastly improved       Fundus Exam      Right Left   Disc sharp anomalous disc, PPA   C/D Ratio 0.5    Macula Flat, Retinal pigment epithelial mottling, No heme or edema flat; blunted foveal reflex   Vessels Normal Vascular attenuation   Periphery Attached attached        Refraction    Wearing Rx      Sphere Cylinder Axis   Right +2.50 +0.50 153   Left +1.75 +1.50 180       Manifest Refraction      Sphere Cylinder Axis Dist VA   Right -0.50 +0.50 153 20/30-1   Left +0.25 -0.25 180 20/50          IMAGING AND PROCEDURES           ASSESSMENT/PLAN:    ICD-10-CM   1. 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 Vitreous hemorrhage of left eye (HCC) H43.12   3. Pseudophakia, left eye Z96.1   4. Retinal edema H35.81 OCT, Retina - OU - Both Eyes    1. Endophthalmitis OS-  - presented on POD4 s/p phaco/PCIOL on Monday, 1.7.19 - VA HM OS w/ 0.15mm hypopyon, fibrin membrane and irregular pupil, and dense vitreous opacities on b-scan ultrasound - now POW8 s/p PPV + AC washout + intravitreal vanc, ceftaz, and cefepime, OS, on 1.11.19  - extensive AC and posterior debris / purulent material noted intra op  - gram stain and culture positive for coag negative staph  - today, vastly improved VA with vit debris and heme settling inferiorly  - BCVA 20/50 OS             - IOP 14 OS today; wounds remain seidel negative  - inferior nylon suture removed at slit lamp with betadine and polymixin drops post removal -- no complications (03.05.19)  -  dec PSO to QHS OS             - dec   PF to QID OS  - start  AT OU QID             - keep head elevated             - eye shield when sleeping             - post op drop and positioning instructions reviewed             - tylenol/ibuprofen for pain - f/u 3 weeks   2. Pseudophakia OS - s/p phaco/PCIOL on Monday, 11.7.19    Ophthalmic Meds Ordered this visit:  Meds ordered this encounter  Medications  . bacitracin-polymyxin b (POLYSPORIN) ophthalmic ointment    Sig: Place into the left eye at bedtime. Place a 1/2 inch ribbon of ointment into the lower eyelid.    Dispense:  3.5 g    Refill:  2  . DISCONTD: prednisoLONE acetate (PRED FORTE) 1 % ophthalmic suspension    Sig: Place 1 drop into the left eye 4 (four) times daily.    Dispense:  15 mL    Refill:  1        Return in about 3 weeks (around 05/16/2017) for F/u Endophthalmitis OS.  There are no Patient Instructions on file for this visit.   Explained the diagnoses, plan, and follow up with the patient and they expressed understanding.  Patient expressed  understanding of the importance of proper follow up care.   This document serves as a record of services personally performed by Karie ChimeraBrian G. Orazio Weller, MD, PhD. It was created on their behalf by Virgilio BellingMeredith Fabian, COA, a certified ophthalmic assistant. The creation of this record is the provider's dictation and/or activities during the visit.  Electronically signed by: Virgilio BellingMeredith Fabian, COA  06/24/17 10:48 AM   Karie ChimeraBrian G. Devanshi Califf, M.D., Ph.D. Diseases & Surgery of the Retina and Vitreous [facility] 06/24/17  I have reviewed the above documentation for accuracy and completeness, and I agree with the above. Karie ChimeraBrian G. Yolando Gillum, M.D., Ph.D. 06/24/17 10:48 AM     Abbreviations: M myopia (nearsighted); A astigmatism; H hyperopia (farsighted); P presbyopia; Mrx spectacle prescription;  CTL contact lenses; OD right eye; OS left eye; OU both eyes  XT exotropia;  ET esotropia; PEK punctate epithelial keratitis; PEE punctate epithelial erosions; DES dry eye syndrome; MGD meibomian gland dysfunction; ATs artificial tears; PFAT's preservative free artificial tears; [place] nuclear sclerotic cataract; PSC posterior subcapsular cataract; ERM epi-retinal membrane; PVD posterior vitreous detachment; RD retinal detachment; DM diabetes mellitus; DR diabetic retinopathy; NPDR non-proliferative diabetic retinopathy; PDR proliferative diabetic retinopathy; CSME clinically significant macular edema; DME diabetic macular edema; dbh dot blot hemorrhages; CWS cotton wool spot; POAG primary open angle glaucoma; C/D cup-to-disc ratio; HVF humphrey visual field; GVF goldmann visual field; OCT optical coherence tomography; IOP intraocular pressure; BRVO Branch retinal vein occlusion; CRVO central retinal vein occlusion; CRAO central retinal artery occlusion; BRAO branch retinal artery occlusion; RT retinal tear; SB scleral buckle; PPV pars plana vitrectomy; VH Vitreous hemorrhage; PRP panretinal laser photocoagulation; IVK intravitreal kenalog; VMT vitreomacular traction; MH Macular hole;  NVD neovascularization of the disc; NVE neovascularization elsewhere; AREDS age related eye disease study; ARMD age related macular degeneration; POAG primary open angle glaucoma; EBMD epithelial/anterior basement membrane dystrophy; ACIOL anterior chamber intraocular lens; IOL intraocular lens; PCIOL posterior chamber intraocular lens; Phaco/IOL phacoemulsification with intraocular lens placement; PRK photorefractive keratectomy; LASIK laser assisted in situ keratomileusis; HTN hypertension; DM diabetes mellitus; COPD chronic obstructive pulmonary disease

## 2017-04-25 ENCOUNTER — Ambulatory Visit (INDEPENDENT_AMBULATORY_CARE_PROVIDER_SITE_OTHER): Payer: Medicare Other | Admitting: Ophthalmology

## 2017-04-25 ENCOUNTER — Encounter (INDEPENDENT_AMBULATORY_CARE_PROVIDER_SITE_OTHER): Payer: Self-pay | Admitting: Ophthalmology

## 2017-04-25 DIAGNOSIS — H3581 Retinal edema: Secondary | ICD-10-CM | POA: Diagnosis not present

## 2017-04-25 DIAGNOSIS — H44002 Unspecified purulent endophthalmitis, left eye: Secondary | ICD-10-CM

## 2017-04-25 DIAGNOSIS — Z961 Presence of intraocular lens: Secondary | ICD-10-CM

## 2017-04-25 DIAGNOSIS — H4312 Vitreous hemorrhage, left eye: Secondary | ICD-10-CM

## 2017-04-25 MED ORDER — BACITRACIN-POLYMYXIN B 500-10000 UNIT/GM OP OINT: TOPICAL_OINTMENT | Freq: Every day | OPHTHALMIC | 2 refills | Status: DC

## 2017-04-26 ENCOUNTER — Encounter (INDEPENDENT_AMBULATORY_CARE_PROVIDER_SITE_OTHER): Payer: Self-pay | Admitting: Ophthalmology

## 2017-04-26 MED ORDER — PREDNISOLONE ACETATE 1 % OP SUSP: 1.0000 [drp] | Freq: Four times a day (QID) | OPHTHALMIC | 1 refills | Status: DC

## 2017-05-15 NOTE — Progress Notes (Addendum)
[facility] - Clinic Note  05/19/2017     CHIEF COMPLAINT Patient presents for Retina Follow Up s/p phaco/PCIOL OS, 1.7.19, rg S/p PPV/AC washout/intravitreal vanc, ceftaz, cefepime OS, 1.11.19, bz  HISTORY OF PRESENT ILLNESS: Jennifer Estrada is a 72 y.o. female who presents to the clinic today for:   HPI    Retina Follow Up    Patient presents with  Other.  In left eye.  Severity is moderate.  Since onset it is stable.  I, the attending physician,  performed the HPI with the patient and updated documentation appropriately.          Comments    72 y/o female pt here for f/u for endophthalmitis OS.  Vision improving OS.  Feels vision OD may not be as good.  Seeing floaters in her peripheral vision ou, and seeing "cobwebs" OS.  OS stays dilated, and is very photophobic (worse in the a.m.).  Sees lots of glare, halos and starbursts OS.  Denies pain, flashes.  Has been having headaches, but believes they are related to sinus/allergy issues.  Area around OS has been swollen, but she also attributes this to sinus/allergies.  Pred QID OS, ATS QID OS, Polymixin ung QHS OS.       Last edited by Rennis ChrisZamora, Renesmay Nesbitt, MD on 05/19/2017 11:00 AM. (History)      Referring physician: Merlene LaughterStoneking, Hal, MD [address] [place], [place] [address]  HISTORICAL INFORMATION:   Selected notes from the MEDICAL RECORD NUMBER Referred by Dr. Hortense Ramal. Groat for concern of endophthalmitis OS  LEE- 01.11.19  Ocular Hx- CE/toric IOL OS (01.07.19 - R. Groat) - VA 1 day PO OS 20/30, VA OS (01.11.19) HM PMH-     CURRENT MEDICATIONS: Current Outpatient Medications (Ophthalmic Drugs)  Medication Sig  . bacitracin-polymyxin b (POLYSPORIN) ophthalmic ointment Place into the left eye at bedtime. Place a 1/2 inch ribbon of ointment into the lower eyelid. (Patient not taking: Reported on 06/18/2017)  . prednisoLONE acetate (PRED FORTE) 1 % ophthalmic suspension Place 1 drop into the left eye 2  (two) times daily.   No current facility-administered medications for this visit.  (Ophthalmic Drugs)   Current Outpatient Medications (Other)  Medication Sig  . Calcium Carbonate-Vitamin D (CALCIUM + D PO) Take 1 tablet by mouth daily.  . chlorhexidine (PERIDEX) 0.12 % solution Use as directed 15 mLs in the mouth or throat daily as needed (sores).   Marland Kitchen. dexlansoprazole (DEXILANT) 60 MG capsule Take 60 mg by mouth daily.  Marland Kitchen. docusate sodium 100 MG CAPS Take 100 mg by mouth 2 (two) times daily.  . fexofenadine (ALLEGRA) 30 MG tablet Take 30 mg by mouth as needed (allergies).   Marland Kitchen. PARoxetine (PAXIL) 20 MG tablet Take 20 mg by mouth every morning.  . propranolol ER (INDERAL LA) 80 MG 24 hr capsule Take 1 capsule (80 mg total) by mouth daily. (Patient taking differently: Take 60 mg by mouth daily. )  . rosuvastatin (CRESTOR) 5 MG tablet Take 5 mg by mouth daily.  . Vitamin D, Ergocalciferol, (DRISDOL) 50000 units CAPS capsule    No current facility-administered medications for this visit.  (Other)      REVIEW OF SYSTEMS: ROS    Positive for: Eyes   Negative for: Constitutional, Gastrointestinal, Neurological, Skin, Genitourinary, Musculoskeletal, HENT, Endocrine, Cardiovascular, Respiratory, Psychiatric, Allergic/Imm, Heme/Lymph   Last edited by Celine MansBaxley, Andrew G, COA on 05/19/2017 10:34 AM. (History)       ALLERGIES  Allergies  Allergen Reactions  . Atorvastatin Other (See Comments)    Feels anxous  . Cortisone Other (See Comments)    headache  . Oxycodone Other (See Comments)    confusion  . Paroxetine Other (See Comments)    anxiety  . Penicillins Other (See Comments)    Unknown per pt  . Pravastatin Other (See Comments)    muscle pain   . Singulair [Montelukast] Other (See Comments)    Chest pain  . Sulfa Antibiotics Nausea Only  . Aleve [Naproxen Sodium] Rash  . Augmentin [Amoxicillin-Pot Clavulanate] Rash  . Prednisone Other (See Comments)    headaches    PAST MEDICAL  HISTORY Past Medical History:  Diagnosis Date  . Allergic rhinitis   . Chronic kidney disease   . Depression   . DJD (degenerative joint disease) of knee   . GERD (gastroesophageal reflux disease)   . Hyperlipidemia   . Migraine    Takes Propranolol  . Neuromuscular disorder (HCC)    Body tremors at times,h/o falling cast right arm  . Osteoporosis   . Tremors of nervous system   . Vitamin D deficiency    Past Surgical History:  Procedure Laterality Date  . KNEE SURGERY    . Left Foot Surgery  20 years ago   20 years ago  . ORIF PATELLA Left 02/12/2014   Procedure: OPEN REDUCTION INTERNAL (ORIF) FIXATION LEFT PATELLA;  Surgeon: [name], MD;  Location: [facility];  Service: Orthopedics;  Laterality: Left;  . PARS PLANA VITRECTOMY Left 02/21/2017   Procedure: PARS PLANA VITRECTOMY 25 GAUGE FOR ENDOPHTHALMITIS;  Surgeon: [name], MD;  Location: [facility] OR;  Service: Ophthalmology;  Laterality: Left;  . WRIST FRACTURE SURGERY      FAMILY HISTORY History reviewed. No pertinent family history.  SOCIAL HISTORY Social History   Tobacco Use  . Smoking status: Never Smoker  . Smokeless tobacco: Never Used  Substance Use Topics  . Alcohol use: No  . Drug use: No         OPHTHALMIC EXAM:  Base Eye Exam    Visual Acuity (Snellen - Linear)      Right Left   Dist [place] 20/40 20/50 -2   Dist ph [place] NI 20/50 +2       Tonometry (Tonopen, 10:30 AM)      Right Left   Pressure 14 14       Pupils      Dark Light Shape React APD   Right 2 2 Round Minimal None   Left 6 6 Irregular NR None       Visual Fields (Counting fingers)      Left Right    Full Full       Extraocular Movement      Right Left    Full Full       Neuro/Psych    Oriented x3:  Yes   Mood/Affect:  Normal       Dilation    Both eyes:  1.0% Mydriacyl, 2.5% Phenylephrine @ 10:30 AM        Slit Lamp and Fundus Exam    Slit Lamp Exam      Right Left   Lids/Lashes Normal Normal    Conjunctiva/Sclera White and quiet White and quiet   Cornea Arcus, 2+ inferior Punctate epithelial erosions Arcus, trace Edema, trace descemet folds; nylon suture temporally; wounds seidel negative; 1-2+ PEE  - greatest inferiorly, dry tear film, irregular surface inferiorly   Anterior  Chamber Deep and quiet 0.5+ cell / pigment, Fibrin and pupillary membrane resolved; clotted hemes at 12 and 3 oclock resolved   Iris Round and dilated Irregular pupil with temporal iridectomy/iridodialysis   Lens 3+ Nuclear sclerosis, 2+ Cortical cataract PCIOL displaced superiorly, trace pigment on IOL   Vitreous Vitreous syneresis Vitreous debris and hemorrhage clearing and settling inferiorly--vastly improved       Fundus Exam      Right Left   Disc Sharp, disc hemorrhage at 0400 tilted disc, inferior PPA   C/D Ratio 0.5 0.5   Macula Flat, Retinal pigment epithelial mottling, No heme or edema flat; blunted foveal reflex   Vessels Normal Vascular attenuation   Periphery Attached attached          IMAGING AND PROCEDURES           ASSESSMENT/PLAN:    ICD-10-CM   1. 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 Pseudophakia, left eye Z96.1   4. Retinal edema H35.81 OCT, Retina - OU - Both Eyes    1. Endophthalmitis OS- improving - presented on POD4 s/p phaco/PCIOL on Monday, 1.7.19 - VA HM OS w/ 0.68mm hypopyon, fibrin membrane and irregular pupil, and dense vitreous opacities on b-scan ultrasound - now POW12 s/p PPV + AC washout + intravitreal vanc, ceftaz, and cefepime, OS, on 1.11.19  - extensive AC and posterior debris / purulent material noted intra op  - gram stain and culture positive for coag negative staph  - today, vastly improved VA with vit debris and heme settling inferiorly  - BCVA 20/50 OS             - IOP 14 OS today; wounds remain seidel negative  - inferior nylon suture removed at slit lamp with betadine and  polymixin drops post removal -- no complications (03.05.19)             - cont   PF to QID OS  - cont  AT OU QID             - keep head elevated - f/u 4 weeks  2. Iridodialysis OS - sequelae of endophthalmitis and surgery - pt with significant glare symptoms - monitor for now - if symptoms worsen or fail to improve, consider referral to anterior segment surgeon for evaluation   3. Pseudophakia OS - s/p phaco/PCIOL on Monday, 11.7.19    Ophthalmic Meds Ordered this visit:  No orders of the defined types were placed in this encounter.       Return in about 1 month (around 06/16/2017) for F/U Endophthalmitis OS.  There are no Patient Instructions on file for this visit.   Explained the diagnoses, plan, and follow up with the patient and they expressed understanding.  Patient expressed understanding of the importance of proper follow up care.   This document serves as a record of services personally performed by Karie Chimera, MD, PhD. It was created on their behalf by Virgilio Belling, COA, a certified ophthalmic assistant. The creation of this record is the provider's dictation and/or activities during the visit.  Electronically signed by: Virgilio Belling, COA  06/24/17 10:50 AM    Karie Chimera, M.D., Ph.D. Diseases & Surgery of the Retina and Vitreous [facility] 06/24/17   I have reviewed the above documentation for accuracy and completeness, and I agree with the above. Karie Chimera, M.D., Ph.D. 06/24/17  10:50 AM    Abbreviations: M myopia (nearsighted); A astigmatism; H hyperopia (farsighted); P presbyopia; Mrx spectacle prescription;  CTL contact lenses; OD right eye; OS left eye; OU both eyes  XT exotropia; ET esotropia; PEK punctate epithelial keratitis; PEE punctate epithelial erosions; DES dry eye syndrome; MGD meibomian gland dysfunction; ATs artificial tears; PFAT's preservative free artificial tears; NSC nuclear sclerotic  cataract; PSC posterior subcapsular cataract; ERM epi-retinal membrane; PVD posterior vitreous detachment; RD retinal detachment; DM diabetes mellitus; DR diabetic retinopathy; NPDR non-proliferative diabetic retinopathy; PDR proliferative diabetic retinopathy; CSME clinically significant macular edema; DME diabetic macular edema; dbh dot blot hemorrhages; CWS cotton wool spot; POAG primary open angle glaucoma; C/D cup-to-disc ratio; HVF humphrey visual field; GVF goldmann visual field; OCT optical coherence tomography; IOP intraocular pressure; BRVO Branch retinal vein occlusion; CRVO central retinal vein occlusion; CRAO central retinal artery occlusion; BRAO branch retinal artery occlusion; RT retinal tear; SB scleral buckle; PPV pars plana vitrectomy; VH Vitreous hemorrhage; PRP panretinal laser photocoagulation; IVK intravitreal kenalog; VMT vitreomacular traction; MH Macular hole;  NVD neovascularization of the disc; NVE neovascularization elsewhere; AREDS age related eye disease study; ARMD age related macular degeneration; POAG primary open angle glaucoma; EBMD epithelial/anterior basement membrane dystrophy; ACIOL anterior chamber intraocular lens; IOL intraocular lens; PCIOL posterior chamber intraocular lens; Phaco/IOL phacoemulsification with intraocular lens placement; PRK photorefractive keratectomy; LASIK laser assisted in situ keratomileusis; HTN hypertension; DM diabetes mellitus; COPD chronic obstructive pulmonary disease

## 2017-05-19 ENCOUNTER — Ambulatory Visit (INDEPENDENT_AMBULATORY_CARE_PROVIDER_SITE_OTHER): Payer: Medicare Other | Admitting: Ophthalmology

## 2017-05-19 ENCOUNTER — Encounter (INDEPENDENT_AMBULATORY_CARE_PROVIDER_SITE_OTHER): Payer: Self-pay | Admitting: Ophthalmology

## 2017-05-19 DIAGNOSIS — H3581 Retinal edema: Secondary | ICD-10-CM | POA: Diagnosis not present

## 2017-05-19 DIAGNOSIS — H21532 Iridodialysis, left eye: Secondary | ICD-10-CM

## 2017-05-19 DIAGNOSIS — H44002 Unspecified purulent endophthalmitis, left eye: Secondary | ICD-10-CM

## 2017-05-19 DIAGNOSIS — Z961 Presence of intraocular lens: Secondary | ICD-10-CM

## 2017-05-20 ENCOUNTER — Ambulatory Visit (INDEPENDENT_AMBULATORY_CARE_PROVIDER_SITE_OTHER): Payer: Self-pay

## 2017-05-20 ENCOUNTER — Ambulatory Visit (INDEPENDENT_AMBULATORY_CARE_PROVIDER_SITE_OTHER): Payer: Medicare Other

## 2017-05-20 ENCOUNTER — Ambulatory Visit (INDEPENDENT_AMBULATORY_CARE_PROVIDER_SITE_OTHER): Payer: Medicare Other | Admitting: Orthopaedic Surgery

## 2017-05-20 ENCOUNTER — Encounter (INDEPENDENT_AMBULATORY_CARE_PROVIDER_SITE_OTHER): Payer: Self-pay | Admitting: Orthopaedic Surgery

## 2017-05-20 VITALS — BP 103/75 | HR 84 | Resp 18 | Ht 66.0 in | Wt 167.0 lb

## 2017-05-20 DIAGNOSIS — M170 Bilateral primary osteoarthritis of knee: Secondary | ICD-10-CM | POA: Diagnosis not present

## 2017-05-20 DIAGNOSIS — M25562 Pain in left knee: Secondary | ICD-10-CM | POA: Diagnosis not present

## 2017-05-20 DIAGNOSIS — M25561 Pain in right knee: Secondary | ICD-10-CM | POA: Insufficient documentation

## 2017-05-20 NOTE — Progress Notes (Signed)
Office Visit Note   Patient: Jennifer Estrada           Date of Birth: December 08, 1945           MRN: [id number] Visit Date: 05/20/2017              Requested by: Jennifer Laughter, MD [address] [place], [place] [address] PCP: Jennifer Laughter, MD   Assessment & Plan: Visit Diagnoses:  1. Pain in both knees, unspecified chronicity     Plan: No evidence of acute injury by plain films.  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  Will use Tylenol and a cane.  Reevaluate in 2-3 weeks if no improvement  Follow-Up Instructions: No follow-ups on file.   Orders:  Orders Placed This Encounter  Procedures  . XR KNEE 3 VIEW RIGHT  . XR KNEE 3 VIEW LEFT   No orders of the defined types were placed in this encounter.     Procedures: No procedures performed   Clinical Data: No additional findings.   Subjective: Chief Complaint  Patient presents with  . Left Knee - Pain  . Right Knee - Pain  . Knee Pain    Bil knee pain, fell 05/19/17, swelling, bruising, difficulty walking, difficulty sleeping,   Jennifer Estrada relates that she fell last night landing on both of her knees.  She is having considerable pain to the point where she is using a cane.  Having more trouble on the left than she is on the right.  She has had a prior patella fracture with ORIF and subsequent left knee arthroscopy for debridement.  He did well after the above only to have an exacerbation after falling last night.  HPI  Review of Systems  Constitutional: Positive for activity change and fatigue.  HENT: Negative for trouble swallowing.   Eyes: Positive for pain.  Respiratory: Positive for shortness of breath.   Cardiovascular: Positive for leg swelling.  Gastrointestinal: Positive for constipation.  Endocrine: Positive for cold intolerance.  Genitourinary: Positive for difficulty urinating.  Musculoskeletal: Positive for gait problem and joint  swelling.  Skin: Negative for rash.  Allergic/Immunologic: Negative for food allergies.  Neurological: Positive for weakness.  Hematological: Bruises/bleeds easily.  Psychiatric/Behavioral: Positive for sleep disturbance.     Objective: Vital Signs: BP 103/75 (BP Location: Left Arm, Patient Position: Sitting, Cuff Size: Normal)   Pulse 84   Resp 18   Ht 5\' 6"  (1.676 m)   Wt 167 lb (75.8 kg)   BMI 26.95 kg/m   Physical Exam  Ortho Exam awake alert and oriented x3 comfortable sitting.  Mild diffuse ecchymosis about the right knee.  No effusion.  No instability.  Full extension flexion over 100 degrees.  No opening with varus or valgus stress.  Some mild areas of discomfort in the areas of ecchymosis.  No swelling distally.  Neurovascular exam intact.  Painless range of motion of both hips.  Straight leg raise negative No areas of ecchymosis about her left knee.  Lacks a few degrees to full extension.  No patella pain.  Able to actively extend the knee and slowly flex over 90 degrees.  No instability.  Skin intact.  No swelling distally  Specialty Comments:  No specialty comments available.  Imaging: No results found.   PMFS History: Patient Active Problem List   Diagnosis Date Noted  . Patella fracture 02/12/2014  . Migraine syndrome 05/02/2012  . Anxiety attack 05/02/2012  .  TIA (transient ischemic attack) 05/02/2012   Past Medical History:  Diagnosis Date  . Allergic rhinitis   . Chronic kidney disease   . Depression   . DJD (degenerative joint disease) of knee   . GERD (gastroesophageal reflux disease)   . Hyperlipidemia   . Migraine    Takes Propranolol  . Neuromuscular disorder (HCC)    Body tremors at times,h/o falling cast right arm  . Osteoporosis   . Tremors of nervous system   . Vitamin D deficiency     History reviewed. No pertinent family history.  Past Surgical History:  Procedure Laterality Date  . KNEE SURGERY    . Left Foot Surgery  20 years ago    20 years ago  . ORIF PATELLA Left 02/12/2014   Procedure: OPEN REDUCTION INTERNAL (ORIF) FIXATION LEFT PATELLA;  Surgeon: Shelda PalMatthew D Olin, MD;  Location: [facility];  Service: Orthopedics;  Laterality: Left;  . PARS PLANA VITRECTOMY Left 02/21/2017   Procedure: PARS PLANA VITRECTOMY 25 GAUGE FOR ENDOPHTHALMITIS;  Surgeon: Jennifer Estrada, Brian, MD;  [facility];  Service: Ophthalmology;  Laterality: Left;  . WRIST FRACTURE SURGERY     Social History   Occupational History  . Not on file  Tobacco Use  . Smoking status: Never Smoker  . Smokeless tobacco: Never Used  Substance and Sexual Activity  . Alcohol use: No  . Drug use: No  . Sexual activity: Not Currently

## 2017-06-17 NOTE — Progress Notes (Addendum)
[facility] - Clinic Note  06/18/2017     CHIEF COMPLAINT Patient presents for Retina Follow Up s/p phaco/PCIOL OS, 1.7.19, rg S/p PPV/AC washout/intravitreal vanc, ceftaz, cefepime OS, 1.11.19, bz  HISTORY OF PRESENT ILLNESS: Jennifer Estrada is a 72 y.o. female who presents to the clinic today for:   HPI    Retina Follow Up    Patient presents with  Other.  In left eye.  This started 6 months ago.  Severity is mild.  Since onset it is gradually worsening.  I, the attending physician,  performed the HPI with the patient and updated documentation appropriately.          Comments    F/U Endophthalmitis OS. Patient states she feels as if her right eye is getting weaker"I have to use it all the time",she is seeing "halo lights" more now OS. Pt reports she is having frequent headaches,"I't maybe my allergy's". Pt is using Pred Forte QID,dry eye gtt's, she is taking vit's.       Last edited by [name], MD on 06/18/2017 10:38 AM. (History)      Referring physician: Ernesto Rutherford, MD [address] [place], [place] [address]  HISTORICAL INFORMATION:   Selected notes from the MEDICAL RECORD NUMBER Referred by Dr. Hortense Ramal for concern of endophthalmitis OS  LEE- 01.11.19  Ocular Hx- CE/toric IOL OS (01.07.19 - R. Groat) - VA 1 day PO OS 20/30, VA OS (01.11.19) HM PMH-     CURRENT MEDICATIONS: Current Outpatient Medications (Ophthalmic Drugs)  Medication Sig  . prednisoLONE acetate (PRED FORTE) 1 % ophthalmic suspension Place 1 drop into the left eye 2 (two) times daily.  . bacitracin-polymyxin b (POLYSPORIN) ophthalmic ointment Place into the left eye at bedtime. Place a 1/2 inch ribbon of ointment into the lower eyelid. (Patient not taking: Reported on 06/18/2017)   No current facility-administered medications for this visit.  (Ophthalmic Drugs)   Current Outpatient Medications (Other)  Medication Sig  . Calcium Carbonate-Vitamin D (CALCIUM + D PO)  Take 1 tablet by mouth daily.  . chlorhexidine (PERIDEX) 0.12 % solution Use as directed 15 mLs in the mouth or throat daily as needed (sores).   Marland Kitchen dexlansoprazole (DEXILANT) 60 MG capsule Take 60 mg by mouth daily.  Marland Kitchen docusate sodium 100 MG CAPS Take 100 mg by mouth 2 (two) times daily.  . fexofenadine (ALLEGRA) 30 MG tablet Take 30 mg by mouth as needed (allergies).   Marland Kitchen PARoxetine (PAXIL) 20 MG tablet Take 20 mg by mouth every morning.  . propranolol ER (INDERAL LA) 80 MG 24 hr capsule Take 1 capsule (80 mg total) by mouth daily. (Patient taking differently: Take 60 mg by mouth daily. )  . rosuvastatin (CRESTOR) 5 MG tablet Take 5 mg by mouth daily.  . Vitamin D, Ergocalciferol, (DRISDOL) 50000 units CAPS capsule    No current facility-administered medications for this visit.  (Other)      REVIEW OF SYSTEMS: ROS    Positive for: Musculoskeletal, Eyes, Allergic/Imm   Negative for: Constitutional, Gastrointestinal, Neurological, Skin, Genitourinary, HENT, Endocrine, Cardiovascular, Respiratory, Psychiatric, Heme/Lymph   Last edited by Eldridge Scot, LPN on 02/20/1476 10:05 AM. (History)       ALLERGIES Allergies  Allergen Reactions  . Atorvastatin Other (See Comments)    Feels anxous  . Cortisone Other (See Comments)    headache  . Oxycodone Other (See Comments)    confusion  . Paroxetine Other (See Comments)  anxiety  . Penicillins Other (See Comments)    Unknown per pt  . Pravastatin Other (See Comments)    muscle pain   . Singulair [Montelukast] Other (See Comments)    Chest pain  . Sulfa Antibiotics Nausea Only  . Aleve [Naproxen Sodium] Rash  . Augmentin [Amoxicillin-Pot Clavulanate] Rash  . Prednisone Other (See Comments)    headaches    PAST MEDICAL HISTORY Past Medical History:  Diagnosis Date  . Allergic rhinitis   . Chronic kidney disease   . Depression   . DJD (degenerative joint disease) of knee   . GERD (gastroesophageal reflux disease)   .  Hyperlipidemia   . Migraine    Takes Propranolol  . Neuromuscular disorder (HCC)    Body tremors at times,h/o falling cast right arm  . Osteoporosis   . Tremors of nervous system   . Vitamin D deficiency    Past Surgical History:  Procedure Laterality Date  . KNEE SURGERY    . Left Foot Surgery  20 years ago   20 years ago  . ORIF PATELLA Left 02/12/2014   Procedure: OPEN REDUCTION INTERNAL (ORIF) FIXATION LEFT PATELLA;  Surgeon: [name], MD;  Location: [facility];  Service: Orthopedics;  Laterality: Left;  . PARS PLANA VITRECTOMY Left 02/21/2017   Procedure: PARS PLANA VITRECTOMY 25 GAUGE FOR ENDOPHTHALMITIS;  Surgeon: [name], MD;  Location: [facility] OR;  Service: Ophthalmology;  Laterality: Left;  . WRIST FRACTURE SURGERY      FAMILY HISTORY History reviewed. No pertinent family history.  SOCIAL HISTORY Social History   Tobacco Use  . Smoking status: Never Smoker  . Smokeless tobacco: Never Used  Substance Use Topics  . Alcohol use: No  . Drug use: No         OPHTHALMIC EXAM:  Base Eye Exam    Visual Acuity (Snellen - Linear)      Right Left   Dist [place] 20/30 +1 20/40 -2   Dist ph [place] 20/25 -1 NI       Tonometry (Tonopen, 10:11 AM)      Right Left   Pressure 14 15       Pupils      Dark Light Shape React APD   Right 3 2 Round Slow None   Left 7 7 Irregular NR None       Visual Fields (Counting fingers)      Left Right    Full Full       Extraocular Movement      Right Left    Full, Ortho Full, Ortho       Neuro/Psych    Oriented x3:  Yes   Mood/Affect:  Normal       Dilation    Both eyes:  1.0% Mydriacyl, 2.5% Phenylephrine @ 10:11 AM        Slit Lamp and Fundus Exam    Slit Lamp Exam      Right Left   Lids/Lashes Normal Normal   Conjunctiva/Sclera White and quiet White and quiet   Cornea Arcus, 2+ inferior Punctate epithelial erosions Arcus, trace Edema, trace descemet folds; nylon suture temporally; wounds seidel negative; 1-2+ PEE   - greatest inferiorly, dry tear film, irregular surface inferiorly   Anterior Chamber Deep and quiet 0.5+ cell / pigment; no heme or fibrin   Iris Round and dilated Irregular pupil with temporal iridectomy/iridodialysis   Lens 3+ Nuclear sclerosis, 2+ Cortical cataract PCIOL displaced superiorly, trace pigment on IOL - improving  Vitreous Vitreous syneresis Vitreous debris and hemorrhage clearing and settling inferiorly--vastly improved       Fundus Exam      Right Left   Disc Sharp tilted disc, inferior PPA   C/D Ratio 0.5 0.5   Macula Flat, Retinal pigment epithelial mottling, No heme or edema flat; blunted foveal reflex, Retinal pigment epithelial mottling, Nasal Epiretinal membrane   Vessels Normal Vascular attenuation   Periphery Attached attached          IMAGING AND PROCEDURES           ASSESSMENT/PLAN:    ICD-10-CM   1. 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 Pseudophakia, left eye Z96.1   4. Retinal edema H35.81 OCT, Retina - OU - Both Eyes    1. Endophthalmitis OS- resolved - presented on POD4 s/p phaco/PCIOL on Monday, 1.7.19 - VA HM OS w/ 0.46mm hypopyon, fibrin membrane and irregular pupil, and dense vitreous opacities on b-scan ultrasound - s/p PPV + AC washout + intravitreal vanc, ceftaz, and cefepime, OS, on 1.11.19  - extensive AC and posterior debris / purulent material noted intra op  - gram stain and culture positive for coag negative staph  - today, vastly improved VA with vit debris and heme settling inferiorly  - BCVA 20/40 OS             - IOP 15 OS today  - inferior nylon suture removed at slit lamp with betadine and polymixin drops post removal -- no complications (03.05.19)             - cont   PF to BID OS  - cont  AT OU QID             - keep head elevated - f/u 4 weeks  2. Iridodialysis OS - sequelae of endophthalmitis and surgery - pt with significant glare  symptoms - monitor for now - if symptoms worsen or fail to improve, consider referral to anterior segment surgeon for evaluation   3. Pseudophakia OS - s/p phaco/PCIOL on Monday, 11.7.19 - stable  4. No retinal edema on exam or OCT  Ophthalmic Meds Ordered this visit:  Meds ordered this encounter  Medications  . prednisoLONE acetate (PRED FORTE) 1 % ophthalmic suspension    Sig: Place 1 drop into the left eye 2 (two) times daily.    Dispense:  15 mL    Refill:  1        Return in about 1 month (around 07/16/2017) for F/u Endophthalmitis OS.  There are no Patient Instructions on file for this visit.   Explained the diagnoses, plan, and follow up with the patient and they expressed understanding.  Patient expressed understanding of the importance of proper follow up care.   This document serves as a record of services personally performed by Karie Chimera, MD, PhD. It was created on their behalf by Virgilio Belling, COA, a certified ophthalmic assistant. The creation of this record is the provider's dictation and/or activities during the visit.  Electronically signed by: Virgilio Belling, COA  05.07.19 3:44 PM    Karie Chimera, M.D., Ph.D. Diseases & Surgery of the Retina and Vitreous [facility] 06/17/17  I have reviewed the above documentation for accuracy and completeness, and I agree with the above. Karie Chimera, M.D., Ph.D. 06/21/17 3:44 PM     Abbreviations: M myopia (nearsighted); A  astigmatism; H hyperopia (farsighted); P presbyopia; Mrx spectacle prescription;  CTL contact lenses; OD right eye; OS left eye; OU both eyes  XT exotropia; ET esotropia; PEK punctate epithelial keratitis; PEE punctate epithelial erosions; DES dry eye syndrome; MGD meibomian gland dysfunction; ATs artificial tears; PFAT's preservative free artificial tears; NSC nuclear sclerotic cataract; PSC posterior subcapsular cataract; ERM epi-retinal membrane; PVD posterior  vitreous detachment; RD retinal detachment; DM diabetes mellitus; DR diabetic retinopathy; NPDR non-proliferative diabetic retinopathy; PDR proliferative diabetic retinopathy; CSME clinically significant macular edema; DME diabetic macular edema; dbh dot blot hemorrhages; CWS cotton wool spot; POAG primary open angle glaucoma; C/D cup-to-disc ratio; HVF humphrey visual field; GVF goldmann visual field; OCT optical coherence tomography; IOP intraocular pressure; BRVO Branch retinal vein occlusion; CRVO central retinal vein occlusion; CRAO central retinal artery occlusion; BRAO branch retinal artery occlusion; RT retinal tear; SB scleral buckle; PPV pars plana vitrectomy; VH Vitreous hemorrhage; PRP panretinal laser photocoagulation; IVK intravitreal kenalog; VMT vitreomacular traction; MH Macular hole;  NVD neovascularization of the disc; NVE neovascularization elsewhere; AREDS age related eye disease study; ARMD age related macular degeneration; POAG primary open angle glaucoma; EBMD epithelial/anterior basement membrane dystrophy; ACIOL anterior chamber intraocular lens; IOL intraocular lens; PCIOL posterior chamber intraocular lens; Phaco/IOL phacoemulsification with intraocular lens placement; PRK photorefractive keratectomy; LASIK laser assisted in situ keratomileusis; HTN hypertension; DM diabetes mellitus; COPD chronic obstructive pulmonary disease

## 2017-06-18 ENCOUNTER — Ambulatory Visit (INDEPENDENT_AMBULATORY_CARE_PROVIDER_SITE_OTHER): Payer: Medicare Other | Admitting: Ophthalmology

## 2017-06-18 ENCOUNTER — Encounter (INDEPENDENT_AMBULATORY_CARE_PROVIDER_SITE_OTHER): Payer: Self-pay | Admitting: Ophthalmology

## 2017-06-18 DIAGNOSIS — H21532 Iridodialysis, left eye: Secondary | ICD-10-CM | POA: Diagnosis not present

## 2017-06-18 DIAGNOSIS — Z961 Presence of intraocular lens: Secondary | ICD-10-CM

## 2017-06-18 DIAGNOSIS — H3581 Retinal edema: Secondary | ICD-10-CM | POA: Diagnosis not present

## 2017-06-18 DIAGNOSIS — H44002 Unspecified purulent endophthalmitis, left eye: Secondary | ICD-10-CM | POA: Diagnosis not present

## 2017-06-18 MED ORDER — PREDNISOLONE ACETATE 1 % OP SUSP: 1.0000 [drp] | Freq: Two times a day (BID) | OPHTHALMIC | 1 refills | Status: DC

## 2017-06-21 ENCOUNTER — Encounter (INDEPENDENT_AMBULATORY_CARE_PROVIDER_SITE_OTHER): Payer: Self-pay | Admitting: Ophthalmology

## 2017-07-13 NOTE — Progress Notes (Addendum)
[facility] - Clinic Note  07/14/2017     CHIEF COMPLAINT Patient presents for Retina Follow Up s/p phaco/PCIOL OS, 1.7.19, rg S/p PPV/AC washout/intravitreal vanc, ceftaz, cefepime OS, 1.11.19, bz  HISTORY OF PRESENT ILLNESS: Jennifer Estrada is a 72 y.o. female who presents to the clinic today for:   HPI    Retina Follow Up    Patient presents with  Other.  In left eye.  Severity is moderate.  Duration of 4 weeks.  Since onset it is stable.  I, the attending physician,  performed the HPI with the patient and updated documentation appropriately.          Comments    Pt presents for endophthalmitis F/U, pt states the lights in her eyes will leave occasionally, but then come right back, she states she has a "skim" over her eye, pt states her eye feels like there is something in it that's not supposed to be there, Pt reports using AT and PF as directed;        Last edited by [name], MD on 07/14/2017  2:31 PM. (History)      Referring physician: Merlene Laughter, MD [address] [place], [place] [address]  HISTORICAL INFORMATION:   Selected notes from the MEDICAL RECORD NUMBER Referred by Dr. Hortense Ramal for concern of endophthalmitis OS  LEE- 01.11.19  Ocular Hx- CE/toric IOL OS (01.07.19 - R. Groat) - VA 1 day PO OS 20/30, VA OS (01.11.19) HM PMH-     CURRENT MEDICATIONS: Current Outpatient Medications (Ophthalmic Drugs)  Medication Sig  . bacitracin-polymyxin b (POLYSPORIN) ophthalmic ointment Place into the left eye at bedtime. Place a 1/2 inch ribbon of ointment into the lower eyelid. (Patient not taking: Reported on 06/18/2017)  . prednisoLONE acetate (PRED FORTE) 1 % ophthalmic suspension Place 1 drop into the left eye 2 (two) times daily.   No current facility-administered medications for this visit.  (Ophthalmic Drugs)   Current Outpatient Medications (Other)  Medication Sig  . Calcium Carbonate-Vitamin D (CALCIUM + D PO) Take 1  tablet by mouth daily.  . chlorhexidine (PERIDEX) 0.12 % solution Use as directed 15 mLs in the mouth or throat daily as needed (sores).   Marland Kitchen dexlansoprazole (DEXILANT) 60 MG capsule Take 60 mg by mouth daily.  Marland Kitchen docusate sodium 100 MG CAPS Take 100 mg by mouth 2 (two) times daily.  . fexofenadine (ALLEGRA) 30 MG tablet Take 30 mg by mouth as needed (allergies).   Marland Kitchen PARoxetine (PAXIL) 20 MG tablet Take 20 mg by mouth every morning.  . propranolol ER (INDERAL LA) 80 MG 24 hr capsule Take 1 capsule (80 mg total) by mouth daily. (Patient taking differently: Take 60 mg by mouth daily. )  . rosuvastatin (CRESTOR) 5 MG tablet Take 5 mg by mouth daily.  . Vitamin D, Ergocalciferol, (DRISDOL) 50000 units CAPS capsule    No current facility-administered medications for this visit.  (Other)      REVIEW OF SYSTEMS: ROS    Positive for: Musculoskeletal, Endocrine, Eyes   Negative for: Constitutional, Gastrointestinal, Neurological, Skin, Genitourinary, HENT, Cardiovascular, Respiratory, Psychiatric, Allergic/Imm, Heme/Lymph   Last edited by Posey Boyer, COT on 07/14/2017  8:53 AM. (History)       ALLERGIES Allergies  Allergen Reactions  . Atorvastatin Other (See Comments)    Feels anxous  . Cortisone Other (See Comments)    headache  . Oxycodone Other (See Comments)    confusion  .  Paroxetine Other (See Comments)    anxiety  . Penicillins Other (See Comments)    Unknown per pt  . Pravastatin Other (See Comments)    muscle pain   . Singulair [Montelukast] Other (See Comments)    Chest pain  . Sulfa Antibiotics Nausea Only  . Aleve [Naproxen Sodium] Rash  . Augmentin [Amoxicillin-Pot Clavulanate] Rash  . Prednisone Other (See Comments)    headaches    PAST MEDICAL HISTORY Past Medical History:  Diagnosis Date  . Allergic rhinitis   . Chronic kidney disease   . Depression   . DJD (degenerative joint disease) of knee   . GERD (gastroesophageal reflux disease)   .  Hyperlipidemia   . Migraine    Takes Propranolol  . Neuromuscular disorder (HCC)    Body tremors at times,h/o falling cast right arm  . Osteoporosis   . Tremors of nervous system   . Vitamin D deficiency    Past Surgical History:  Procedure Laterality Date  . KNEE SURGERY    . Left Foot Surgery  20 years ago   20 years ago  . ORIF PATELLA Left 02/12/2014   Procedure: OPEN REDUCTION INTERNAL (ORIF) FIXATION LEFT PATELLA;  Surgeon: [name], MD;  Location: [facility];  Service: Orthopedics;  Laterality: Left;  . PARS PLANA VITRECTOMY Left 02/21/2017   Procedure: PARS PLANA VITRECTOMY 25 GAUGE FOR ENDOPHTHALMITIS;  Surgeon: [name], MD;  Location: [facility] OR;  Service: Ophthalmology;  Laterality: Left;  . WRIST FRACTURE SURGERY      FAMILY HISTORY History reviewed. No pertinent family history.  SOCIAL HISTORY Social History   Tobacco Use  . Smoking status: Never Smoker  . Smokeless tobacco: Never Used  Substance Use Topics  . Alcohol use: No  . Drug use: No         OPHTHALMIC EXAM:  Base Eye Exam    Visual Acuity (Snellen - Linear)      Right Left   Dist [place] 20/30 20/40 -1   Dist ph [place] NI NI   Correction:  Glasses       Tonometry (Tonopen, 9:03 AM)      Right Left   Pressure 16 17       Pupils      Dark Light Shape React APD   Right 5 4 Round Brisk None   Left 7 7 Irregular NR None       Visual Fields (Counting fingers)      Left Right    Full Full       Extraocular Movement      Right Left    Full, Ortho Full, Ortho       Neuro/Psych    Oriented x3:  Yes   Mood/Affect:  Normal       Dilation    Both eyes:  1.0% Mydriacyl, 2.5% Phenylephrine @ 9:03 AM        Slit Lamp and Fundus Exam    Slit Lamp Exam      Right Left   Lids/Lashes Normal Normal   Conjunctiva/Sclera White and quiet White and quiet   Cornea Arcus, 2+ inferior Punctate epithelial erosions Arcus, trace Edema, trace descemet folds; nylon suture temporally; wounds seidel  negative; 2+ PEE  - greatest inferiorly, dry tear film, irregular surface inferiorly   Anterior Chamber Deep and quiet 0.5+ cell / pigment; no heme or fibrin   Iris Round and dilated Irregular pupil with temporal iridectomy/iridodialysis   Lens 3+ Nuclear sclerosis, 2+ Cortical  cataract PCIOL displaced superiorly, trace pigment on IOL - improving   Vitreous Vitreous syneresis Vitreous debris and hemorrhage clearing and settling inferiorly--vastly improved, Posterior vitreous detachment, pigmented debris settling inferiorly       Fundus Exam      Right Left   Disc Sharp tilted disc, inferior PPA   C/D Ratio 0.5 0.5   Macula Flat, Retinal pigment epithelial mottling, No heme or edema flat; blunted foveal reflex, Retinal pigment epithelial mottling, Nasal Epiretinal membrane   Vessels Normal Vascular attenuation   Periphery Attached attached          IMAGING AND PROCEDURES           ASSESSMENT/PLAN:    ICD-10-CM   1. 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 Pseudophakia, left eye Z96.1   4. Retinal edema H35.81 OCT, Retina - OU - Both Eyes  5. Vitreous hemorrhage of left eye (HCC) H43.12     1. Endophthalmitis OS- resolved - presented on POD4 s/p phaco/PCIOL on Monday, 1.7.19 - VA HM OS w/ 0.79mm hypopyon, fibrin membrane and irregular pupil, and dense vitreous opacities on b-scan ultrasound - s/p PPV + AC washout + intravitreal vanc, ceftaz, and cefepime, OS, on 1.11.19  - extensive AC and posterior debris / purulent material noted intra op  - gram stain and culture positive for coag negative staph  - today, doing well with stable VA with just mild residual vit debris and heme settling inferiorly  - BCVA 20/40 OS             - IOP 17 OS today  - inferior nylon suture removed at slit lamp with betadine and polymixin drops post removal -- no complications (03.05.19)             - cont   PF to BID OS  - cont  AT OU QID - f/u 8  weeks  2. Iridodialysis OS - sequelae of endophthalmitis and surgery - pt with significant glare symptoms - monitor for now - if symptoms worsen or fail to improve, consider referral to anterior segment surgeon for evaluation   3. Pseudophakia OS - s/p phaco/PCIOL on Monday, 11.7.19 - stable   4. No retinal edema on exam or OCT   Ophthalmic Meds Ordered this visit:  No orders of the defined types were placed in this encounter.       Return in about 8 weeks (around 09/08/2017) for F/U Endophthalmitis OS, DFE, OCT.  There are no Patient Instructions on file for this visit.   Explained the diagnoses, plan, and follow up with the patient and they expressed understanding.  Patient expressed understanding of the importance of proper follow up care.   This document serves as a record of services personally performed by Karie Chimera, MD, PhD. It was created on their behalf by Virgilio Belling, COA, a certified ophthalmic assistant. The creation of this record is the provider's dictation and/or activities during the visit.  Electronically signed by: Virgilio Belling, COA  06.02.19 2:34 PM   Karie Chimera, M.D., Ph.D. Diseases & Surgery of the Retina and Vitreous [facility]   I have reviewed the above documentation for accuracy and completeness, and I agree with the above. Karie Chimera, M.D., Ph.D. 07/14/17 2:34 PM     Abbreviations: M myopia (nearsighted); A astigmatism; H hyperopia (farsighted); P presbyopia; Mrx spectacle prescription;  CTL contact lenses; OD right eye; OS left eye;  OU both eyes  XT exotropia; ET esotropia; PEK punctate epithelial keratitis; PEE punctate epithelial erosions; DES dry eye syndrome; MGD meibomian gland dysfunction; ATs artificial tears; PFAT's preservative free artificial tears; NSC nuclear sclerotic cataract; PSC posterior subcapsular cataract; ERM epi-retinal membrane; PVD posterior vitreous detachment; RD retinal  detachment; DM diabetes mellitus; DR diabetic retinopathy; NPDR non-proliferative diabetic retinopathy; PDR proliferative diabetic retinopathy; CSME clinically significant macular edema; DME diabetic macular edema; dbh dot blot hemorrhages; CWS cotton wool spot; POAG primary open angle glaucoma; C/D cup-to-disc ratio; HVF humphrey visual field; GVF goldmann visual field; OCT optical coherence tomography; IOP intraocular pressure; BRVO Branch retinal vein occlusion; CRVO central retinal vein occlusion; CRAO central retinal artery occlusion; BRAO branch retinal artery occlusion; RT retinal tear; SB scleral buckle; PPV pars plana vitrectomy; VH Vitreous hemorrhage; PRP panretinal laser photocoagulation; IVK intravitreal kenalog; VMT vitreomacular traction; MH Macular hole;  NVD neovascularization of the disc; NVE neovascularization elsewhere; AREDS age related eye disease study; ARMD age related macular degeneration; POAG primary open angle glaucoma; EBMD epithelial/anterior basement membrane dystrophy; ACIOL anterior chamber intraocular lens; IOL intraocular lens; PCIOL posterior chamber intraocular lens; Phaco/IOL phacoemulsification with intraocular lens placement; PRK photorefractive keratectomy; LASIK laser assisted in situ keratomileusis; HTN hypertension; DM diabetes mellitus; COPD chronic obstructive pulmonary disease

## 2017-07-14 ENCOUNTER — Encounter (INDEPENDENT_AMBULATORY_CARE_PROVIDER_SITE_OTHER): Payer: Self-pay | Admitting: Ophthalmology

## 2017-07-14 ENCOUNTER — Ambulatory Visit (INDEPENDENT_AMBULATORY_CARE_PROVIDER_SITE_OTHER): Payer: Medicare Other | Admitting: Ophthalmology

## 2017-07-14 DIAGNOSIS — H44002 Unspecified purulent endophthalmitis, left eye: Secondary | ICD-10-CM | POA: Diagnosis not present

## 2017-07-14 DIAGNOSIS — H3581 Retinal edema: Secondary | ICD-10-CM

## 2017-07-14 DIAGNOSIS — H21532 Iridodialysis, left eye: Secondary | ICD-10-CM

## 2017-07-14 DIAGNOSIS — Z961 Presence of intraocular lens: Secondary | ICD-10-CM | POA: Diagnosis not present

## 2017-07-14 DIAGNOSIS — H4312 Vitreous hemorrhage, left eye: Secondary | ICD-10-CM

## 2017-07-16 ENCOUNTER — Encounter (INDEPENDENT_AMBULATORY_CARE_PROVIDER_SITE_OTHER): Payer: Medicare Other | Admitting: Ophthalmology

## 2017-09-05 NOTE — Progress Notes (Signed)
[facility] - Clinic Note  09/08/2017     CHIEF COMPLAINT Patient presents for Retina Follow Up s/p phaco/PCIOL OS, 1.7.19, rg S/p PPV/AC washout/intravitreal vanc, ceftaz, cefepime OS, 1.11.19, bz  HISTORY OF PRESENT ILLNESS: Jennifer MillinerBrenda L Bisceglia is a 72 y.o. female who presents to the clinic today for:   HPI    Retina Follow Up    Patient presents with  Other.  In left eye.  Severity is moderate.  Duration of 8 months.  Since onset it is stable.  I, the attending physician,  performed the HPI with the patient and updated documentation appropriately.          Comments    Pt presents for endophthalmitis f/u, pt states VA is doing okay since last visit, pt denies flashes, pain or wavy vision, she states she is seeing floaters OD, pt has seen Dr. Lucina Mellowoth, her optomtrist, recently and got a new rx, but has not gotten it filled yet, pt has questions based on what Dr. Lucina Mellowoth told her at her appt,        Last edited by Rennis ChrisZamora, Shasta Chinn, MD on 09/08/2017 10:27 AM. (History)    Pt states she has been seen by Dr. Lucina Mellowoth since being seen last; Pt states OD VA is "getting weaker"; Pt states she continues to see "lights all over" in OS;   Referring physician: Merlene LaughterStoneking, Hal, MD [address] [place], [place] [address]  HISTORICAL INFORMATION:   Selected notes from the MEDICAL RECORD NUMBER Referred by Dr. Hortense Ramal. Groat for concern of endophthalmitis OS  LEE- 01.11.19  Ocular Hx- CE/toric IOL OS (01.07.19 - R. Groat) - VA 1 day PO OS 20/30, VA OS (01.11.19) HM PMH-     CURRENT MEDICATIONS: Current Outpatient Medications (Ophthalmic Drugs)  Medication Sig  . bacitracin-polymyxin b (POLYSPORIN) ophthalmic ointment Place into the left eye at bedtime. Place a 1/2 inch ribbon of ointment into the lower eyelid. (Patient not taking: Reported on 06/18/2017)  . prednisoLONE acetate (PRED FORTE) 1 % ophthalmic suspension Place 1 drop into the left eye 2 (two) times daily.   No  current facility-administered medications for this visit.  (Ophthalmic Drugs)   Current Outpatient Medications (Other)  Medication Sig  . Calcium Carbonate-Vitamin D (CALCIUM + D PO) Take 1 tablet by mouth daily.  . chlorhexidine (PERIDEX) 0.12 % solution Use as directed 15 mLs in the mouth or throat daily as needed (sores).   Marland Kitchen. dexlansoprazole (DEXILANT) 60 MG capsule Take 60 mg by mouth daily.  Marland Kitchen. docusate sodium 100 MG CAPS Take 100 mg by mouth 2 (two) times daily.  . fexofenadine (ALLEGRA) 30 MG tablet Take 30 mg by mouth as needed (allergies).   . indomethacin (INDOCIN SR) 75 MG CR capsule Take 75 mg by mouth 2 (two) times daily.  Marland Kitchen. PARoxetine (PAXIL) 20 MG tablet Take 20 mg by mouth every morning.  . propranolol ER (INDERAL LA) 80 MG 24 hr capsule Take 1 capsule (80 mg total) by mouth daily. (Patient taking differently: Take 60 mg by mouth daily. )  . rosuvastatin (CRESTOR) 5 MG tablet Take 5 mg by mouth daily.  . Vitamin D, Ergocalciferol, (DRISDOL) 50000 units CAPS capsule    No current facility-administered medications for this visit.  (Other)      REVIEW OF SYSTEMS: ROS    Positive for: Endocrine, Cardiovascular, Eyes   Negative for: Constitutional, Gastrointestinal, Neurological, Skin, Genitourinary, Musculoskeletal, HENT, Respiratory, Psychiatric, Allergic/Imm, Heme/Lymph   Last  edited by Posey Boyer, COT on 09/08/2017 10:09 AM. (History)       ALLERGIES Allergies  Allergen Reactions  . Atorvastatin Other (See Comments)    Feels anxous  . Cortisone Other (See Comments)    headache  . Oxycodone Other (See Comments)    confusion  . Paroxetine Other (See Comments)    anxiety  . Penicillins Other (See Comments)    Unknown per pt  . Pravastatin Other (See Comments)    muscle pain   . Singulair [Montelukast] Other (See Comments)    Chest pain  . Sulfa Antibiotics Nausea Only  . Aleve [Naproxen Sodium] Rash  . Augmentin [Amoxicillin-Pot Clavulanate] Rash  .  Prednisone Other (See Comments)    headaches    PAST MEDICAL HISTORY Past Medical History:  Diagnosis Date  . Allergic rhinitis   . Chronic kidney disease   . Depression   . DJD (degenerative joint disease) of knee   . GERD (gastroesophageal reflux disease)   . Hyperlipidemia   . Migraine    Takes Propranolol  . Neuromuscular disorder (HCC)    Body tremors at times,h/o falling cast right arm  . Osteoporosis   . Tremors of nervous system   . Vitamin D deficiency    Past Surgical History:  Procedure Laterality Date  . KNEE SURGERY    . Left Foot Surgery  20 years ago   20 years ago  . ORIF PATELLA Left 02/12/2014   Procedure: OPEN REDUCTION INTERNAL (ORIF) FIXATION LEFT PATELLA;  Surgeon: [name], MD;  Location: [facility];  Service: Orthopedics;  Laterality: Left;  . PARS PLANA VITRECTOMY Left 02/21/2017   Procedure: PARS PLANA VITRECTOMY 25 GAUGE FOR ENDOPHTHALMITIS;  Surgeon: [name], MD;  Location: [facility] OR;  Service: Ophthalmology;  Laterality: Left;  . WRIST FRACTURE SURGERY      FAMILY HISTORY History reviewed. No pertinent family history.  SOCIAL HISTORY Social History   Tobacco Use  . Smoking status: Never Smoker  . Smokeless tobacco: Never Used  Substance Use Topics  . Alcohol use: No  . Drug use: No         OPHTHALMIC EXAM:  Base Eye Exam    Visual Acuity (Snellen - Linear)      Right Left   Dist Gardner 20/50 -1 20/40 +2   Dist ph Humphrey 20/25 -2 20/30 -2   Correction:  Glasses       Tonometry (Tonopen, 10:21 AM)      Right Left   Pressure 13 14       Pupils      Dark Light Shape React APD   Right 3 2 Round Sluggish None   Left 7 7 Irregular NR None       Visual Fields (Counting fingers)      Left Right    Full Full       Extraocular Movement      Right Left    Full, Ortho Full, Ortho       Neuro/Psych    Oriented x3:  Yes   Mood/Affect:  Normal       Dilation    Both eyes:  1.0% Mydriacyl, 2.5% Phenylephrine @ 10:21 AM         Slit Lamp and Fundus Exam    Slit Lamp Exam      Right Left   Lids/Lashes Normal Normal   Conjunctiva/Sclera White and quiet White and quiet   Cornea Arcus, 2+ inferior Punctate epithelial erosions  Arcus, trace Edema, trace descemet folds; nylon suture temporally; wounds seidel negative; 2+ PEE  - greatest inferiorly, dry tear film, irregular surface inferiorly   Anterior Chamber Deep and quiet 0.5+ cell / pigment; no heme or fibrin   Iris Round and dilated Irregular pupil with temporal iridectomy/iridodialysis   Lens 3+ Nuclear sclerosis, 2+ Cortical cataract PCIOL displaced superiorly, trace pigment on IOL - improving   Vitreous Vitreous syneresis Vitreous debris and hemorrhage clearing and settling inferiorly--vastly improved, Posterior vitreous detachment, pigmented debris settling inferiorly       Fundus Exam      Right Left   Disc Sharp tilted disc, inferior PPA   C/D Ratio 0.5 0.5   Macula Flat, Retinal pigment epithelial mottling, No heme or edema flat; blunted foveal reflex, Retinal pigment epithelial mottling, Nasal Epiretinal membrane   Vessels Normal Vascular attenuation   Periphery Attached attached          IMAGING AND PROCEDURES           ASSESSMENT/PLAN:    ICD-10-CM   1. 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 Pseudophakia, left eye Z96.1   4. Epiretinal membrane (ERM) of left eye H35.372   5. Retinal edema H35.81 OCT, Retina - OU - Both Eyes    1. Endophthalmitis OS- resolved - presented on POD4 s/p phaco/PCIOL on Monday, 1.7.19 - VA HM OS w/ 0.70mm hypopyon, fibrin membrane and irregular pupil, and dense vitreous opacities on b-scan ultrasound - s/p PPV + AC washout + intravitreal vanc, ceftaz, and cefepime, OS, on 1.11.19  - extensive AC and posterior debris / purulent material noted intra op  - gram stain and culture positive for coag negative staph  - today, doing well with stable VA with just mild residual  vit debris settling inferiorly  - BCVA 20/30 OS!             - IOP 14 OS today  - inferior nylon suture removed at slit lamp with betadine and polymixin drops post removal -- no complications (03.05.19)             - cont   PF to BID OS  - cont  AT OU QID - f/u in 8 weeks  2. Iridodialysis OS - sequelae of endophthalmitis and surgery - pt with significant glare symptoms, but tolerating for now - monitor - if symptoms worsen or fail to improve, consider referral to anterior segment surgeon for evaluation - discussed RBA of surgery and pt wishes to monitor for now   3. Pseudophakia OS - s/p phaco/PCIOL OS on Monday, 11.7.19 - toric IOL displaced superiorly, but stable - do not recommend surgical intervention at this time  4,5 ERM OS - The natural history, anatomy, potential for loss of vision, and treatment options including vitrectomy techniques and the complications of endophthalmitis, retinal detachment, vitreous hemorrhage, cataract progression and permanent vision loss discussed with the patient. - mild nasal ERM OS with some pucker - stable from prior - do not recommend surgical intervention at this time - monitor    Ophthalmic Meds Ordered this visit:  No orders of the defined types were placed in this encounter.       Return in about 8 weeks (around 11/03/2017) for F/U Endophthalmitis OS, DFE, OCT.  There are no Patient Instructions on file for this visit.   Explained the diagnoses, plan, and follow up with the patient and they expressed understanding.  Patient expressed understanding of  the importance of proper follow up care.   This document serves as a record of services personally performed by Karie Chimera, MD, PhD. It was created on their behalf by Laurian Brim, OA, an ophthalmic assistant. The creation of this record is the provider's dictation and/or activities during the visit.    Electronically signed by: Laurian Brim, OA  07.26.2019 8:08 PM    Karie Chimera, M.D., Ph.D. Diseases & Surgery of the Retina and Vitreous [facility] 09/08/17  I have reviewed the above documentation for accuracy and completeness, and I agree with the above. Karie Chimera, M.D., Ph.D. 09/08/17 8:08 PM   Abbreviations: M myopia (nearsighted); A astigmatism; H hyperopia (farsighted); P presbyopia; Mrx spectacle prescription;  CTL contact lenses; OD right eye; OS left eye; OU both eyes  XT exotropia; ET esotropia; PEK punctate epithelial keratitis; PEE punctate epithelial erosions; DES dry eye syndrome; MGD meibomian gland dysfunction; ATs artificial tears; PFAT's preservative free artificial tears; NSC nuclear sclerotic cataract; PSC posterior subcapsular cataract; ERM epi-retinal membrane; PVD posterior vitreous detachment; RD retinal detachment; DM diabetes mellitus; DR diabetic retinopathy; NPDR non-proliferative diabetic retinopathy; PDR proliferative diabetic retinopathy; CSME clinically significant macular edema; DME diabetic macular edema; dbh dot blot hemorrhages; CWS cotton wool spot; POAG primary open angle glaucoma; C/D cup-to-disc ratio; HVF humphrey visual field; GVF goldmann visual field; OCT optical coherence tomography; IOP intraocular pressure; BRVO Branch retinal vein occlusion; CRVO central retinal vein occlusion; CRAO central retinal artery occlusion; BRAO branch retinal artery occlusion; RT retinal tear; SB scleral buckle; PPV pars plana vitrectomy; VH Vitreous hemorrhage; PRP panretinal laser photocoagulation; IVK intravitreal kenalog; VMT vitreomacular traction; MH Macular hole;  NVD neovascularization of the disc; NVE neovascularization elsewhere; AREDS age related eye disease study; ARMD age related macular degeneration; POAG primary open angle glaucoma; EBMD epithelial/anterior basement membrane dystrophy; ACIOL anterior chamber intraocular lens; IOL intraocular lens; PCIOL posterior chamber intraocular lens; Phaco/IOL  phacoemulsification with intraocular lens placement; PRK photorefractive keratectomy; LASIK laser assisted in situ keratomileusis; HTN hypertension; DM diabetes mellitus; COPD chronic obstructive pulmonary disease

## 2017-09-08 ENCOUNTER — Encounter (INDEPENDENT_AMBULATORY_CARE_PROVIDER_SITE_OTHER): Payer: Self-pay | Admitting: Ophthalmology

## 2017-09-08 ENCOUNTER — Ambulatory Visit (INDEPENDENT_AMBULATORY_CARE_PROVIDER_SITE_OTHER): Payer: Medicare Other | Admitting: Ophthalmology

## 2017-09-08 DIAGNOSIS — H35372 Puckering of macula, left eye: Secondary | ICD-10-CM | POA: Diagnosis not present

## 2017-09-08 DIAGNOSIS — H21532 Iridodialysis, left eye: Secondary | ICD-10-CM

## 2017-09-08 DIAGNOSIS — Z961 Presence of intraocular lens: Secondary | ICD-10-CM | POA: Diagnosis not present

## 2017-09-08 DIAGNOSIS — H44002 Unspecified purulent endophthalmitis, left eye: Secondary | ICD-10-CM

## 2017-09-08 DIAGNOSIS — H3581 Retinal edema: Secondary | ICD-10-CM

## 2017-09-26 ENCOUNTER — Ambulatory Visit (INDEPENDENT_AMBULATORY_CARE_PROVIDER_SITE_OTHER): Payer: Medicare Other | Admitting: Orthopaedic Surgery

## 2017-09-26 ENCOUNTER — Encounter (INDEPENDENT_AMBULATORY_CARE_PROVIDER_SITE_OTHER): Payer: Self-pay | Admitting: Orthopaedic Surgery

## 2017-09-26 ENCOUNTER — Ambulatory Visit (INDEPENDENT_AMBULATORY_CARE_PROVIDER_SITE_OTHER): Payer: Self-pay

## 2017-09-26 VITALS — BP 123/82 | HR 71 | Ht 66.0 in | Wt 166.0 lb

## 2017-09-26 DIAGNOSIS — M79671 Pain in right foot: Secondary | ICD-10-CM | POA: Diagnosis not present

## 2017-09-26 NOTE — Progress Notes (Signed)
Office Visit Note   Patient: Jennifer Estrada           Date of Birth: [birth year]           MRN: [id number] Visit Date: 09/26/2017              Requested by: Merlene LaughterStoneking, Hal, MD [address] [place], [place] [address] PCP: Merlene LaughterStoneking, Hal, MD   Assessment & Plan: Visit Diagnoses:  1. Foot pain, right     Plan: Probable stress fracture right second metatarsal.  Has been in an equalizer boot.  We will try a wooden shoe.  Patient on prednisone for an eye issue.  Will reevaluate in 1 month.  Activity as tolerated  Follow-Up Instructions: Return in about 1 month (around 10/27/2017).   Orders:  Orders Placed This Encounter  Procedures  . XR Foot Complete Right   No orders of the defined types were placed in this encounter.     Procedures: No procedures performed   Clinical Data: No additional findings.   Subjective: Chief Complaint  Patient presents with  . Follow-up    R FOOT STRESS FX 08/26/17 PT SEEN DR Wyline MoodWEINER THEN TOLD TO MAKE F/U APPT HERE FOR 3 WEEKS FOR MORE XRAYS. PLACED IN BOOT IMMOBLIZER TO MAKE SURE FX DIDNT SPREAD  Jennifer Estrada variance insidious onset of right midfoot pain of approximately a month ago.  No injury or trauma.  She was seen in the after-hours orthopedic clinic with the diagnosis of a stress fracture.  She is been wearing an [employer] traderequalizer boot notes that she is having less swelling and less pain.  She relates being on prednisone for an issue with her eye.  She is feeling better now than she did even several weeks ago.  No numbness or tingling.  HPI  Review of Systems   Objective: Vital Signs: BP 123/82 (BP Location: Left Arm, Patient Position: Sitting, Cuff Size: Normal)   Pulse 71   Ht 5\' 6"  (1.676 m)   Wt 166 lb (75.3 kg)   BMI 26.79 kg/m   Physical Exam  Constitutional: She is oriented to person, place, and time. She appears well-developed and well-nourished.  HENT:  Mouth/Throat: Oropharynx is clear and moist.  Eyes:  Pupils are equal, round, and reactive to light. EOM are normal.  Pulmonary/Chest: Effort normal.  Neurological: She is alert and oriented to person, place, and time.  Skin: Skin is warm and dry.  Psychiatric: She has a normal mood and affect. Her behavior is normal.    Ortho Exam awake alert and oriented x3.  Comfortable sitting.  Had a left total knee replacement approximately a year ago and is doing well.  Very minimal edema.  Neurovascular exam intact.  No toe deformity.  Specialty Comments:  No specialty comments available.  Imaging: Xr Foot Complete Right  Result Date: 09/26/2017 Films of the right foot obtained in several projections.  There is some thickening at the midportion of the second metatarsal possibly consistent with a stress fracture.  There is not obvious new periosteal elevation.  She is symptomatic in that area.  There is bony demineralization.    PMFS History: Patient Active Problem List   Diagnosis Date Noted  . Pain in both knees 05/20/2017  . Patella fracture 02/12/2014  . Migraine syndrome 05/02/2012  . Anxiety attack 05/02/2012  . TIA (transient ischemic attack) 05/02/2012   Past Medical History:  Diagnosis Date  . Allergic rhinitis   . Chronic kidney disease   .  Depression   . DJD (degenerative joint disease) of knee   . GERD (gastroesophageal reflux disease)   . Hyperlipidemia   . Migraine    Takes Propranolol  . Neuromuscular disorder (HCC)    Body tremors at times,h/o falling cast right arm  . Osteoporosis   . Tremors of nervous system   . Vitamin D deficiency     History reviewed. No pertinent family history.  Past Surgical History:  Procedure Laterality Date  . KNEE SURGERY    . Left Foot Surgery  20 years ago   20 years ago  . ORIF PATELLA Left 02/12/2014   Procedure: OPEN REDUCTION INTERNAL (ORIF) FIXATION LEFT PATELLA;  Surgeon: Shelda PalMatthew D Olin, MD;  Location: [facility];  Service: Orthopedics;  Laterality: Left;  . PARS PLANA VITRECTOMY  Left 02/21/2017   Procedure: PARS PLANA VITRECTOMY 25 GAUGE FOR ENDOPHTHALMITIS;  Surgeon: Rennis ChrisZamora, Brian, MD;  [facility];  Service: Ophthalmology;  Laterality: Left;  . WRIST FRACTURE SURGERY     Social History   Occupational History  . Not on file  Tobacco Use  . Smoking status: Never Smoker  . Smokeless tobacco: Never Used  Substance and Sexual Activity  . Alcohol use: No  . Drug use: No  . Sexual activity: Not Currently     Valeria BatmanPeter W Tessah Patchen, MD   Note - This record has been created using [employer] software.  Chart creation errors have been sought, but may not always  have been located. Such creation errors do not reflect on  the standard of medical care.

## 2017-10-20 ENCOUNTER — Encounter (INDEPENDENT_AMBULATORY_CARE_PROVIDER_SITE_OTHER): Payer: Self-pay | Admitting: Orthopaedic Surgery

## 2017-10-20 ENCOUNTER — Ambulatory Visit (INDEPENDENT_AMBULATORY_CARE_PROVIDER_SITE_OTHER): Payer: Medicare Other | Admitting: Orthopaedic Surgery

## 2017-10-20 VITALS — BP 147/87 | HR 69 | Ht 66.0 in | Wt 166.0 lb

## 2017-10-20 DIAGNOSIS — M79671 Pain in right foot: Secondary | ICD-10-CM

## 2017-10-20 NOTE — Progress Notes (Signed)
Office Visit Note   Patient: Jennifer Estrada           Date of Birth: 01-Nov-1945           MRN: [id number] Visit Date: 10/20/2017              Requested by: Jennifer Laughter, MD [address] [place], [place] [address] PCP: Jennifer Laughter, MD   Assessment & Plan: Visit Diagnoses:  1. Right foot pain     Plan: 6-7 weeks status post onset of right foot pain with a presumed stress fracture of the second metatarsal.  Comfortable in the wooden shoe.  Has lost sight in her left eye related to an infection.  She remains on prednisone.  Doing well from my standpoint may wean herself from the wooden shoe return to see me as needed  Follow-Up Instructions: Return if symptoms worsen or fail to improve.   Orders:  No orders of the defined types were placed in this encounter.  No orders of the defined types were placed in this encounter.     Procedures: No procedures performed   Clinical Data: No additional findings.   Subjective: Chief Complaint  Patient presents with  . Follow-up    R FOOT FOLLOW UP THINGS GOING GREAT  Jennifer Estrada on his twin 6 and 7 weeks status post onset of right foot pain with a presumed diagnosis of stress fracture of the second metatarsal.  Doing well in the wooden shoe.  Not having any swelling.  Oftentimes able to walk without the shoe.  No numbness or tingling.  Doing well also with her left knee status post arthroscopic debridement  HPI  Review of Systems  Constitutional: Negative for fatigue and fever.  HENT: Negative for ear pain.   Eyes: Negative for pain.  Respiratory: Negative for cough and shortness of breath.   Cardiovascular: Negative for leg swelling.  Gastrointestinal: Positive for constipation. Negative for diarrhea.  Genitourinary: Negative for difficulty urinating.  Musculoskeletal: Negative for back pain and neck pain.  Skin: Negative for rash.  Allergic/Immunologic: Negative for food allergies.  Neurological:  Negative for weakness and numbness.  Hematological: Bruises/bleeds easily.  Psychiatric/Behavioral: Negative for sleep disturbance.     Objective: Vital Signs: BP (!) 147/87 (BP Location: Left Arm, Patient Position: Sitting, Cuff Size: Normal)   Pulse 69   Ht 5\' 6"  (1.676 m)   Wt 166 lb (75.3 kg)   BMI 26.79 kg/m   Physical Exam  Constitutional: She appears well-developed.  HENT:  Head: Normocephalic.  Pulmonary/Chest: Effort normal.  Neurological: She is alert.  Skin: Skin is warm and dry.    Ortho Exam no effusion left knee.  Some patellar crepitation consistent with a role patella fracture.  No significant medial lateral joint pain discomfort.  No calf discomfort.  No significant pain over the dorsum of her right foot and particularly over the second and third metatarsals.  No ecchymosis.  No edema.  Specialty Comments:  No specialty comments available.  Imaging: No results found.   PMFS History: Patient Active Problem List   Diagnosis Date Noted  . Pain in both knees 05/20/2017  . Patella fracture 02/12/2014  . Migraine syndrome 05/02/2012  . Anxiety attack 05/02/2012  . TIA (transient ischemic attack) 05/02/2012   Past Medical History:  Diagnosis Date  . Allergic rhinitis   . Chronic kidney disease   . Depression   . DJD (degenerative joint disease) of knee   . GERD (gastroesophageal  reflux disease)   . Hyperlipidemia   . Migraine    Takes Propranolol  . Neuromuscular disorder (HCC)    Body tremors at times,h/o falling cast right arm  . Osteoporosis   . Tremors of nervous system   . Vitamin D deficiency     History reviewed. No pertinent family history.  Past Surgical History:  Procedure Laterality Date  . KNEE SURGERY    . Left Foot Surgery  20 years ago   20 years ago  . ORIF PATELLA Left 02/12/2014   Procedure: OPEN REDUCTION INTERNAL (ORIF) FIXATION LEFT PATELLA;  Surgeon: [name], MD;  Location: [facility];  Service: Orthopedics;   Laterality: Left;  . PARS PLANA VITRECTOMY Left 02/21/2017   Procedure: PARS PLANA VITRECTOMY 25 GAUGE FOR ENDOPHTHALMITIS;  Surgeon: [name], MD;  Location: [facility] OR;  Service: Ophthalmology;  Laterality: Left;  . WRIST FRACTURE SURGERY     Social History   Occupational History  . Not on file  Tobacco Use  . Smoking status: Never Smoker  . Smokeless tobacco: Never Used  Substance and Sexual Activity  . Alcohol use: No  . Drug use: No  . Sexual activity: Not Currently

## 2017-10-30 NOTE — Progress Notes (Signed)
[facility] - Clinic Note  10/31/2017     CHIEF COMPLAINT Patient presents for Retina Follow Up s/p phaco/PCIOL OS, 1.7.19, rg S/p PPV/AC washout/intravitreal vanc, ceftaz, cefepime OS, 1.11.19, bz  HISTORY OF PRESENT ILLNESS: Jennifer Estrada is a 72 y.o. female who presents to the clinic today for:   HPI    Retina Follow Up    Patient presents with  Other.  In left eye.  Severity is moderate.  I, the attending physician,  performed the HPI with the patient and updated documentation appropriately.          Comments    F/U Endophthalmitis OS. Patient states she received new RX glass,she does not see as good as she once could. Pt continues to have occasional floaters OD, OS at night she has halos vision.       Last edited by Rennis ChrisZamora, Berniece Abid, MD on 10/31/2017  1:49 PM. (History)    Pt states she has been seen by Dr. Lucina Mellowoth since being seen last; Pt states OD VA is "getting weaker"; Pt states she continues to see "lights all over" in OS;   Referring physician: Merlene LaughterStoneking, Hal, MD [address] [place], [place] [address]  HISTORICAL INFORMATION:   Selected notes from the MEDICAL RECORD NUMBER Referred by Dr. Hortense Ramal. Groat for concern of endophthalmitis OS  LEE- 01.11.19  Ocular Hx- CE/toric IOL OS (01.07.19 - R. Groat) - VA 1 day PO OS 20/30, VA OS (01.11.19) HM PMH-     CURRENT MEDICATIONS: Current Outpatient Medications (Ophthalmic Drugs)  Medication Sig  . bacitracin-polymyxin b (POLYSPORIN) ophthalmic ointment Place into the left eye at bedtime. Place a 1/2 inch ribbon of ointment into the lower eyelid.  . prednisoLONE acetate (PRED FORTE) 1 % ophthalmic suspension Place 1 drop into the left eye 2 (two) times daily.   No current facility-administered medications for this visit.  (Ophthalmic Drugs)   Current Outpatient Medications (Other)  Medication Sig  . Calcium Carbonate-Vitamin D (CALCIUM + D PO) Take 1 tablet by mouth daily.  .  chlorhexidine (PERIDEX) 0.12 % solution Use as directed 15 mLs in the mouth or throat daily as needed (sores).   Marland Kitchen. dexlansoprazole (DEXILANT) 60 MG capsule Take 60 mg by mouth daily.  Marland Kitchen. docusate sodium 100 MG CAPS Take 100 mg by mouth 2 (two) times daily.  . fexofenadine (ALLEGRA) 30 MG tablet Take 30 mg by mouth as needed (allergies).   . indomethacin (INDOCIN SR) 75 MG CR capsule Take 75 mg by mouth 2 (two) times daily.  Marland Kitchen. PARoxetine (PAXIL) 20 MG tablet Take 20 mg by mouth every morning.  . propranolol ER (INDERAL LA) 80 MG 24 hr capsule Take 1 capsule (80 mg total) by mouth daily. (Patient taking differently: Take 60 mg by mouth daily. )  . rosuvastatin (CRESTOR) 5 MG tablet Take 5 mg by mouth daily.  . Vitamin D, Ergocalciferol, (DRISDOL) 50000 units CAPS capsule    No current facility-administered medications for this visit.  (Other)      REVIEW OF SYSTEMS: ROS    Positive for: Eyes   Negative for: Constitutional, Gastrointestinal, Neurological, Skin, Genitourinary, Musculoskeletal, HENT, Endocrine, Cardiovascular, Respiratory, Psychiatric, Allergic/Imm, Heme/Lymph   Last edited by Eldridge ScotKendrick, Glenda, LPN on 6/04/54099/20/2019  1:14 PM. (History)       ALLERGIES Allergies  Allergen Reactions  . Atorvastatin Other (See Comments)    Feels anxous  . Cortisone Other (See Comments)    headache  .  Oxycodone Other (See Comments)    confusion  . Paroxetine Other (See Comments)    anxiety  . Penicillins Other (See Comments)    Unknown per pt  . Pravastatin Other (See Comments)    muscle pain   . Singulair [Montelukast] Other (See Comments)    Chest pain  . Sulfa Antibiotics Nausea Only  . Aleve [Naproxen Sodium] Rash  . Augmentin [Amoxicillin-Pot Clavulanate] Rash  . Prednisone Other (See Comments)    headaches    PAST MEDICAL HISTORY Past Medical History:  Diagnosis Date  . Allergic rhinitis   . Chronic kidney disease   . Depression   . DJD (degenerative joint disease) of  knee   . GERD (gastroesophageal reflux disease)   . Hyperlipidemia   . Migraine    Takes Propranolol  . Neuromuscular disorder (HCC)    Body tremors at times,h/o falling cast right arm  . Osteoporosis   . Tremors of nervous system   . Vitamin D deficiency    Past Surgical History:  Procedure Laterality Date  . KNEE SURGERY    . Left Foot Surgery  20 years ago   20 years ago  . ORIF PATELLA Left 02/12/2014   Procedure: OPEN REDUCTION INTERNAL (ORIF) FIXATION LEFT PATELLA;  Surgeon: [name], MD;  Location: [facility] ORS;  Service: Orthopedics;  Laterality: Left;  . PARS PLANA VITRECTOMY Left 02/21/2017   Procedure: PARS PLANA VITRECTOMY 25 GAUGE FOR ENDOPHTHALMITIS;  Surgeon: [name], MD;  Location: [facility] OR;  Service: Ophthalmology;  Laterality: Left;  . WRIST FRACTURE SURGERY      FAMILY HISTORY History reviewed. No pertinent family history.  SOCIAL HISTORY Social History   Tobacco Use  . Smoking status: Never Smoker  . Smokeless tobacco: Never Used  Substance Use Topics  . Alcohol use: No  . Drug use: No         OPHTHALMIC EXAM:  Base Eye Exam    Visual Acuity (Snellen - Linear)      Right Left   Dist cc 20/30 20/40 -2   Dist ph cc 20/25 -2 20/40 +2   Correction:  Glasses       Tonometry (Tonopen, 1:19 PM)      Right Left   Pressure 17 16       Pupils      Dark Light Shape React APD   Right 4 3 Round Brisk None   Left 4 3 Round Brisk None       Visual Fields      Left Right    Full Full       Extraocular Movement      Right Left    Full, Nystagmus Full, Nystagmus       Neuro/Psych    Oriented x3:  Yes   Mood/Affect:  Normal       Dilation    Both eyes:  1.0% Mydriacyl, 2.5% Phenylephrine @ 1:19 PM        Slit Lamp and Fundus Exam    Slit Lamp Exam      Right Left   Lids/Lashes Normal Normal   Conjunctiva/Sclera White and quiet White and quiet   Cornea Arcus, 2+ inferior Punctate epithelial erosions Arcus, trace Edema, trace  descemet folds; nylon suture temporally; wounds seidel negative; 2+ PEE  - greatest inferiorly, dry tear film, irregular surface inferiorly   Anterior Chamber Deep and quiet No cell or pigment, no heme or fibrin   Iris Round and dilated Irregular pupil with  temporal iridectomy/iridodialysis   Lens 3+ Nuclear sclerosis, 2+ Cortical cataract PCIOL displaced superiorly, trace pigment on IOL - improving   Vitreous Vitreous syneresis Vitreous debris and hemorrhage essentially cleared, Posterior vitreous detachment, pigmented debris settling inferiorly       Fundus Exam      Right Left   Disc Sharp tilted disc, inferior PPA   C/D Ratio 0.5 0.5   Macula Flat, Retinal pigment epithelial mottling, No heme or edema flat; blunted foveal reflex, Retinal pigment epithelial mottling, Nasal Epiretinal membrane   Vessels Normal Vascular attenuation   Periphery Attached attached          IMAGING AND PROCEDURES           ASSESSMENT/PLAN:    ICD-10-CM   1. 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 Pseudophakia, left eye Z96.1   4. Epiretinal membrane (ERM) of left eye H35.372   5. Retinal edema H35.81 OCT, Retina - OU - Both Eyes  6. Vitreous hemorrhage of left eye (HCC) H43.12     1. Endophthalmitis OS- resolved - presented on POD4 s/p phaco/PCIOL on Monday, 1.7.19 - VA HM OS w/ 0.21mm hypopyon, fibrin membrane and irregular pupil, and dense vitreous opacities on b-scan ultrasound - s/p PPV + AC washout + intravitreal vanc, ceftaz, and cefepime, OS, on 1.11.19  - extensive AC and posterior debris / purulent material noted intra op  - gram stain and culture positive for coag negative staph  - today, doing well with stable VA with just mild residual vit debris settling inferiorly  - BCVA stable at 20/30-20/40 OS!             - IOP 16 OS today  - inferior nylon suture removed at slit lamp with betadine and polymixin drops post  removal -- no complications (03.05.19)             - cont   PF QD OS  - cont  AT OU QID - f/u 8 weeks  2. Iridodialysis OS - sequelae of endophthalmitis and surgery - pt with significant glare symptoms, but tolerating for now - monitor - if symptoms worsen or fail to improve, consider referral to anterior segment surgeon for evaluation - discussed RBA of surgery and pt wishes to monitor for now   3. Pseudophakia OS - s/p phaco/PCIOL OS on Monday, 11.7.19 - toric IOL displaced superiorly, but stable - do not recommend surgical intervention at this time  4,5 ERM OS - The natural history, anatomy, potential for loss of vision, and treatment options including vitrectomy techniques and the complications of endophthalmitis, retinal detachment, vitreous hemorrhage, cataract progression and permanent vision loss discussed with the patient. - mild nasal ERM OS with some pucker - stable from prior - do not recommend surgical intervention at this time - monitor    Ophthalmic Meds Ordered this visit:  No orders of the defined types were placed in this encounter.       Return in about 8 weeks (around 12/26/2017) for F/U Endophthalmitis OS, DFE, OCT.  There are no Patient Instructions on file for this visit.   Explained the diagnoses, plan, and follow up with the patient and they expressed understanding.  Patient expressed understanding of the importance of proper follow up care.   This document serves as a record of services personally performed by Karie Chimera, MD, PhD. It was created on their behalf by Laurian Brim, OA,  an ophthalmic assistant. The creation of this record is the provider's dictation and/or activities during the visit.    Electronically signed by: Laurian Brim, OA  09.19.19 12:53 AM    Karie Chimera, M.D., Ph.D. Diseases & Surgery of the Retina and Vitreous [facility]   I have reviewed the above documentation for accuracy and  completeness, and I agree with the above. Karie Chimera, M.D., Ph.D. 11/02/17 12:53 AM    Abbreviations: M myopia (nearsighted); A astigmatism; H hyperopia (farsighted); P presbyopia; Mrx spectacle prescription;  CTL contact lenses; OD right eye; OS left eye; OU both eyes  XT exotropia; ET esotropia; PEK punctate epithelial keratitis; PEE punctate epithelial erosions; DES dry eye syndrome; MGD meibomian gland dysfunction; ATs artificial tears; PFAT's preservative free artificial tears; NSC nuclear sclerotic cataract; PSC posterior subcapsular cataract; ERM epi-retinal membrane; PVD posterior vitreous detachment; RD retinal detachment; DM diabetes mellitus; DR diabetic retinopathy; NPDR non-proliferative diabetic retinopathy; PDR proliferative diabetic retinopathy; CSME clinically significant macular edema; DME diabetic macular edema; dbh dot blot hemorrhages; CWS cotton wool spot; POAG primary open angle glaucoma; C/D cup-to-disc ratio; HVF humphrey visual field; GVF goldmann visual field; OCT optical coherence tomography; IOP intraocular pressure; BRVO Branch retinal vein occlusion; CRVO central retinal vein occlusion; CRAO central retinal artery occlusion; BRAO branch retinal artery occlusion; RT retinal tear; SB scleral buckle; PPV pars plana vitrectomy; VH Vitreous hemorrhage; PRP panretinal laser photocoagulation; IVK intravitreal kenalog; VMT vitreomacular traction; MH Macular hole;  NVD neovascularization of the disc; NVE neovascularization elsewhere; AREDS age related eye disease study; ARMD age related macular degeneration; POAG primary open angle glaucoma; EBMD epithelial/anterior basement membrane dystrophy; ACIOL anterior chamber intraocular lens; IOL intraocular lens; PCIOL posterior chamber intraocular lens; Phaco/IOL phacoemulsification with intraocular lens placement; PRK photorefractive keratectomy; LASIK laser assisted in situ keratomileusis; HTN hypertension; DM diabetes mellitus; COPD  chronic obstructive pulmonary disease

## 2017-10-31 ENCOUNTER — Ambulatory Visit (INDEPENDENT_AMBULATORY_CARE_PROVIDER_SITE_OTHER): Payer: Medicare Other | Admitting: Ophthalmology

## 2017-10-31 ENCOUNTER — Encounter (INDEPENDENT_AMBULATORY_CARE_PROVIDER_SITE_OTHER): Payer: Self-pay | Admitting: Ophthalmology

## 2017-10-31 DIAGNOSIS — H44002 Unspecified purulent endophthalmitis, left eye: Secondary | ICD-10-CM

## 2017-10-31 DIAGNOSIS — H35372 Puckering of macula, left eye: Secondary | ICD-10-CM | POA: Diagnosis not present

## 2017-10-31 DIAGNOSIS — H3581 Retinal edema: Secondary | ICD-10-CM

## 2017-10-31 DIAGNOSIS — H21532 Iridodialysis, left eye: Secondary | ICD-10-CM

## 2017-10-31 DIAGNOSIS — Z961 Presence of intraocular lens: Secondary | ICD-10-CM | POA: Diagnosis not present

## 2017-10-31 DIAGNOSIS — H4312 Vitreous hemorrhage, left eye: Secondary | ICD-10-CM

## 2017-11-02 ENCOUNTER — Encounter (INDEPENDENT_AMBULATORY_CARE_PROVIDER_SITE_OTHER): Payer: Self-pay | Admitting: Ophthalmology

## 2017-11-03 ENCOUNTER — Encounter (INDEPENDENT_AMBULATORY_CARE_PROVIDER_SITE_OTHER): Payer: Medicare Other | Admitting: Ophthalmology

## 2017-11-07 ENCOUNTER — Encounter (INDEPENDENT_AMBULATORY_CARE_PROVIDER_SITE_OTHER): Payer: Medicare Other | Admitting: Ophthalmology

## 2017-11-11 ENCOUNTER — Other Ambulatory Visit: Payer: Self-pay | Admitting: Geriatric Medicine

## 2017-11-11 DIAGNOSIS — Z1231 Encounter for screening mammogram for malignant neoplasm of breast: Secondary | ICD-10-CM

## 2017-11-12 ENCOUNTER — Ambulatory Visit
Admission: RE | Admit: 2017-11-12 | Discharge: 2017-11-12 | Disposition: A | Discharge status: Home / Self Care | Payer: Medicare Other | Source: Ambulatory Visit | Attending: Geriatric Medicine | Admitting: Geriatric Medicine

## 2017-11-12 DIAGNOSIS — Z1231 Encounter for screening mammogram for malignant neoplasm of breast: Secondary | ICD-10-CM

## 2017-11-17 IMAGING — MR MR KNEE*R* W/O CM
4 of 5 series · 30 of 40 positions shown · non-contrast
Comparison: None.

CLINICAL DATA: Chronic right knee pain for 10 years

EXAM:
MRI OF THE RIGHT KNEE WITHOUT CONTRAST
TECHNIQUE: Multiplanar, multisequence MR imaging of the knee was performed. No
intravenous contrast was administered.

[Series 6: PD fat-sat · axial · right · 3.0mm · 0.39mm/px · z∈[-25,+90]mm · 9 of 36 slices shown (1 of 3)]
[im 1/36]
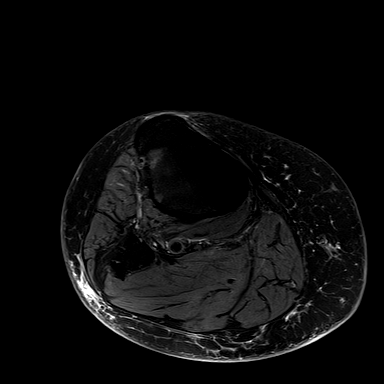
[im 5/36]
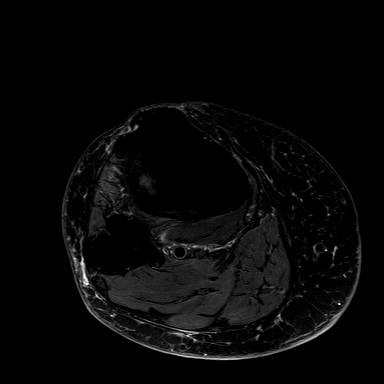
[im 9/36]
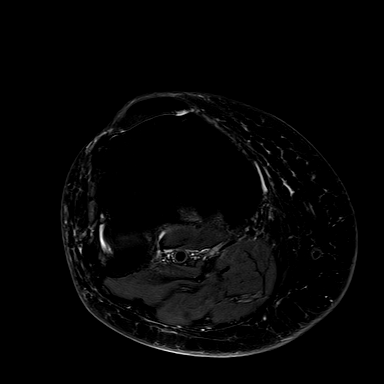
[im 14/36]
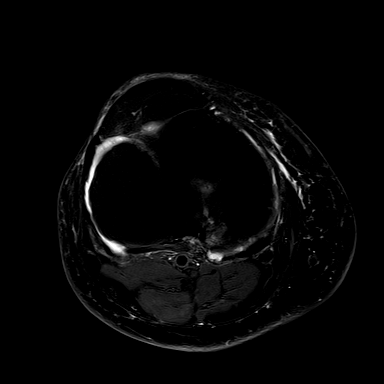
[im 18/36]
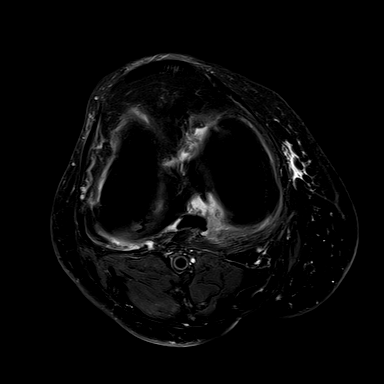
[im 22/36]
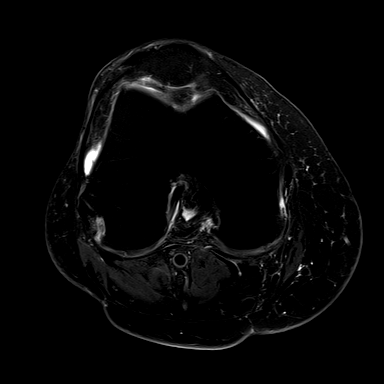
[im 27/36]
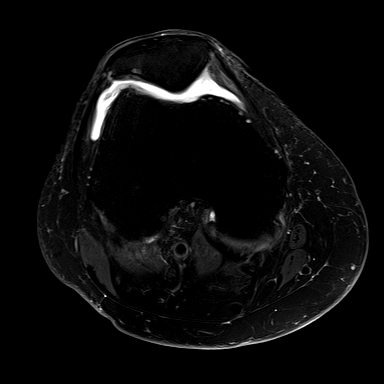
[im 31/36]
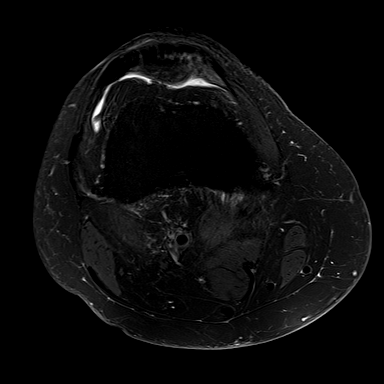
[im 36/36]
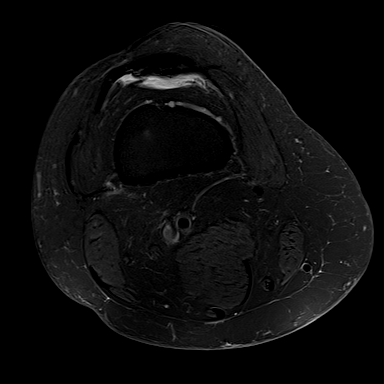

[Series 8: PD fat-sat · coronal · right · 3.0mm · 0.33mm/px · 8 of 33 slices shown (2 of 3)]
[im 1/33]
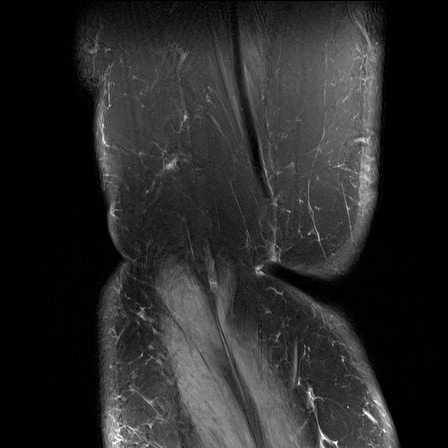
[im 5/33]
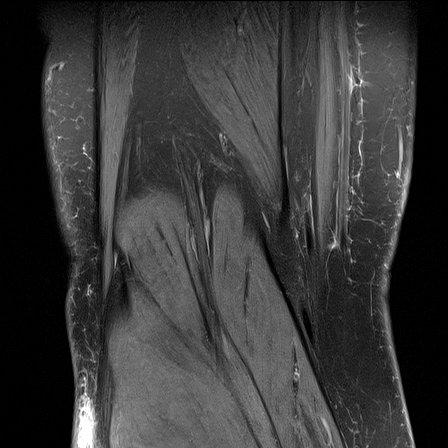
[im 10/33]
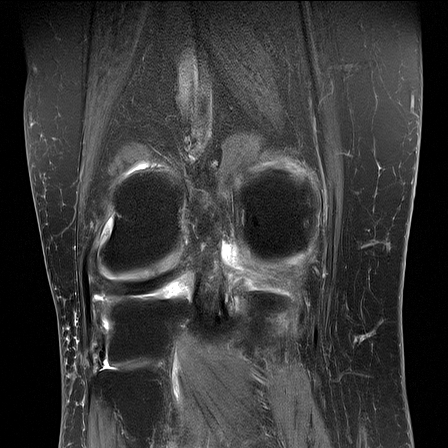
[im 14/33]
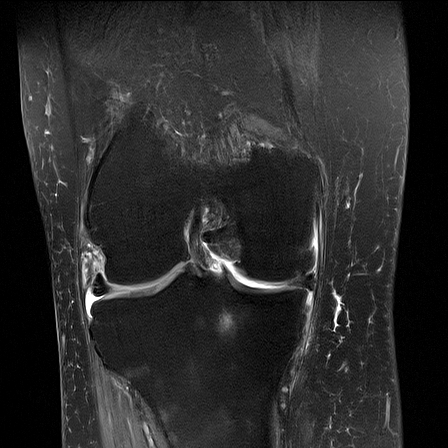
[im 19/33]
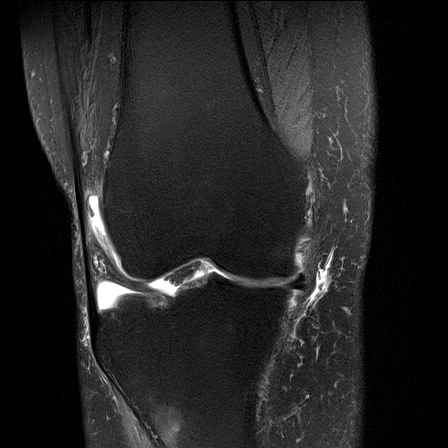
[im 23/33]
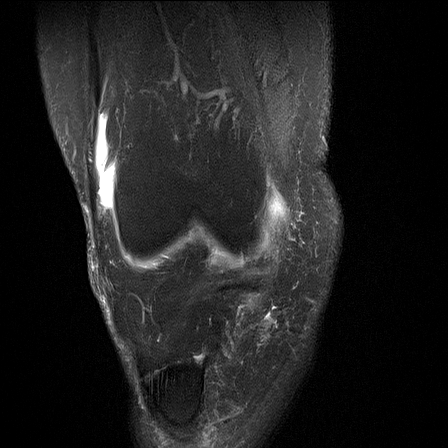
[im 28/33]
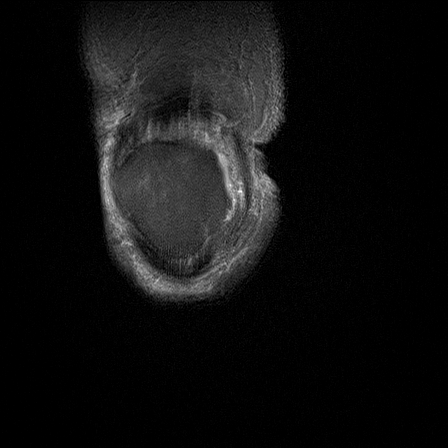
[im 33/33]
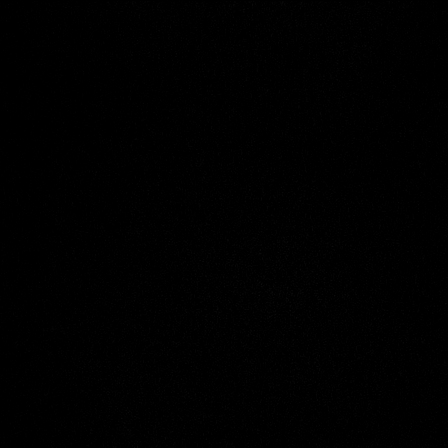

[Series 9: PD fat-sat · sagittal · right · 3.0mm · 0.39mm/px · 7 of 30 slices shown (3 of 3)]
[im 1/30]
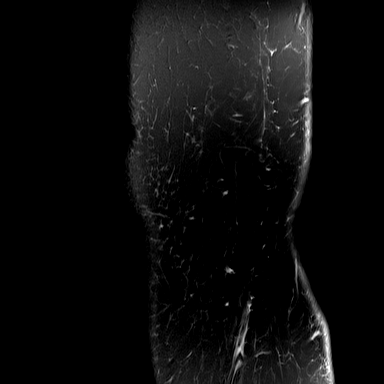
[im 5/30]
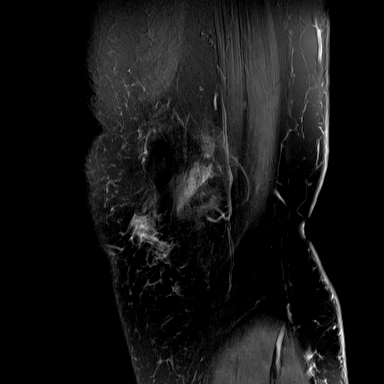
[im 10/30]
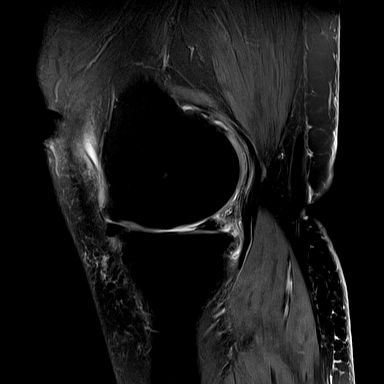
[im 15/30]
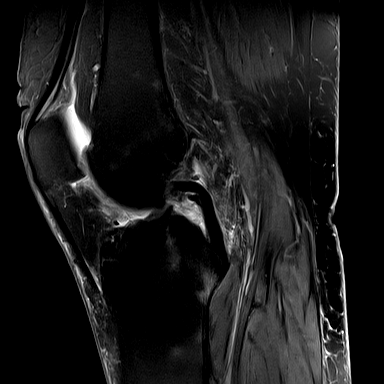
[im 20/30]
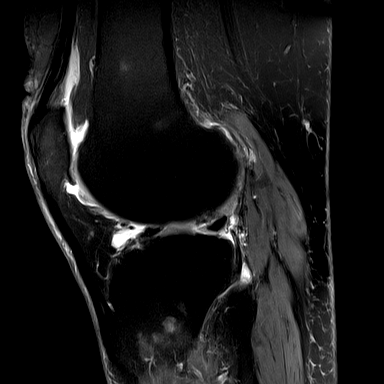
[im 25/30]
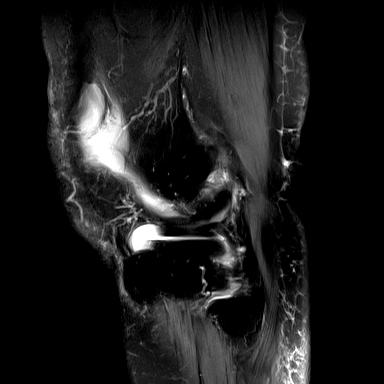
[im 30/30]
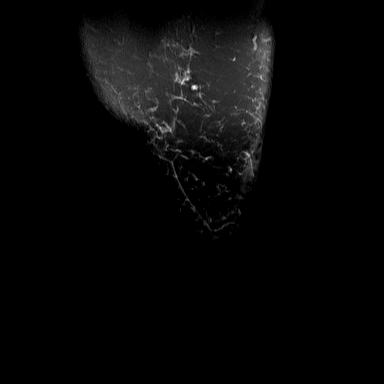

[Series 10: T2 fat-sat · coronal · right · 3.0mm · 0.39mm/px · 6 of 33 slices shown]
[im 1/33]
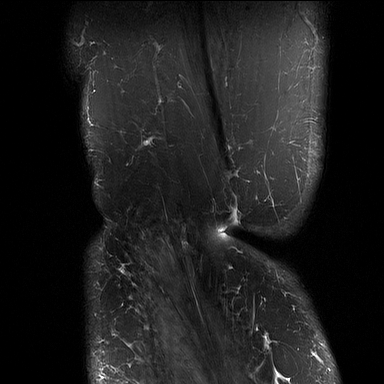
[im 5/33]
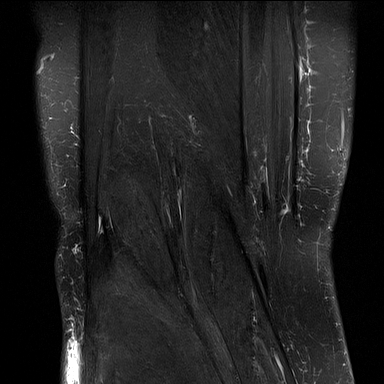
[im 10/33]
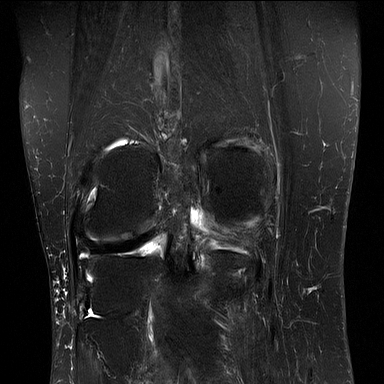
[im 14/33]
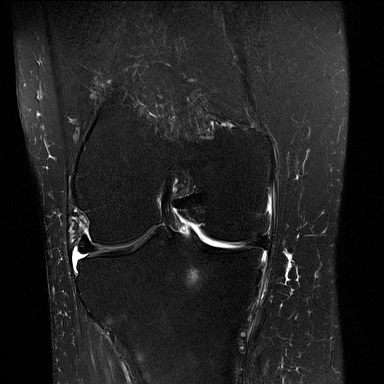
[im 19/33]
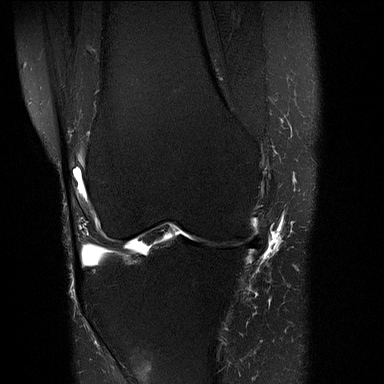
[im 28/33]
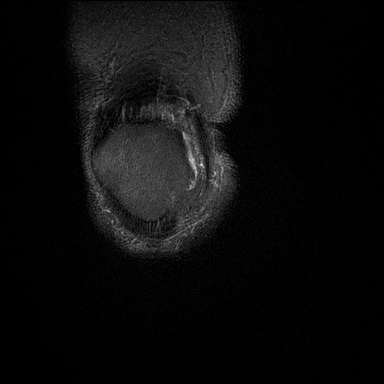

[30 of 40 positions shown; findings below may reference images not displayed]

FINDINGS: MENISCI

Medial meniscus: Horizontal tear of the posterior horn involving the
superior surface and periphery. Potential small radial tear near the
meniscal root. Mild free edge fraying.

Lateral meniscus: Small radial tear of the posterior horn near the
midbody involving the free edge.

LIGAMENTS

Cruciates:  Unremarkable

Collaterals:  Proximal popliteus tendinopathy.

CARTILAGE

Patellofemoral: Variable but typically moderate to severe
degenerative chondral thinning. Some full-thickness loss of
articular cartilage along the patella. Chondral irregularity and
fissuring along the central portion of the femoral trochlear groove.

Medial: Moderate to prominent degenerative chondral thinning with
marginal spurring.

Lateral: Generally mild degenerative chondral thinning but with
focal chondral heterogeneity and focal chondral thinning oriented
sagittally along the posterior portion of the lateral femoral
condyle, area of involvement approximately 0.8 by 1.4 cm.

Joint: Small knee effusion. Questionable tiny chondral fragments in
the popliteus recess, images 19-20 series 6 and also shown on image
8 of series 10, this could alternatively represent some local
synovitis.

Popliteal Fossa: Small Baker's cyst. Distal semimembranosus
tendinopathy. Mild pes anserine bursitis.

Extensor Mechanism: Subtle linear increased signal in the distal
quadriceps tendon is likely incidental but in the appropriate
clinical setting could represent a quadriceps sprain.

Bones: Small geode along the tibial spine. Spurring along the
intercondylar notch and tibial spine.

Other: No supplemental non-categorized findings.
IMPRESSION: 1. Horizontal tear of the posterior horn medial meniscus involving
the superior surface and periphery. Possible small radial tear in a
the meniscal root.
2. Small radial tear of the posterior horn lateral meniscus near the
midbody along the free edge.
3. Proximal popliteus tendinopathy. Mild synovitis versus small
osteochondral fragment along the popliteus recess.
4. Variable degree of degenerative chondral thinning and
tricompartmental spurring, severe in the patellofemoral joint. There
is focal chondral irregularity and thinning posteriorly along the
lateral femoral condyle.
5. Small knee effusion with small Baker's cyst and mild pes anserine
bursitis. Mild distal semimembranosus tendinopathy.
6. Subtle linear increased signal in the distal quadriceps tendon is
likely incidental but in the appropriate clinical setting could
represent a quadriceps sprain.

## 2017-12-24 NOTE — Progress Notes (Signed)
[facility] - Clinic Note  12/26/2017     CHIEF COMPLAINT Patient presents for Retina Follow Up s/p phaco/PCIOL OS, 1.7.19, rg S/p PPV/AC washout/intravitreal vanc, ceftaz, cefepime OS, 1.11.19, bz  HISTORY OF PRESENT ILLNESS: Jennifer Estrada is a 72 y.o. female who presents to the clinic today for:   HPI    Retina Follow Up    Patient presents with  Other.  In left eye.  This started months ago.  Severity is moderate.  Duration of months.  Since onset it is stable.  I, the attending physician,  performed the HPI with the patient and updated documentation appropriately.          Comments    72 y/o female pt here for 8 wk f/u for endophthalmitis OS.  [place].  Has intermittent irritation and FBS in the corners of her left eye.  Symptoms improve w/use of AT.  Denies pain, flashes, floaters.  Pred QD OS.  AT prn OU.       Last edited by Rennis ChrisZamora, Neli Fofana, MD on 12/26/2017 10:38 AM. (History)    pt states bright lights still bother her, she states street lights bother her at night, she states sunlight bothers her a lot as well, she states she changed the transition on her glasses from smoke to brown and she feels like she shouldn't have done that, she states that Dr. Lucina Mellowoth told her that she should get sunglasses that are polarized, pt states she is still having a few floaters  Referring physician: Merlene LaughterStoneking, Hal, MD [address] [place], [place] [id number]  HISTORICAL INFORMATION:   Selected notes from the MEDICAL RECORD NUMBER Referred by Dr. Hortense Ramal. Groat for concern of endophthalmitis OS  LEE- 01.11.19  Ocular Hx- CE/toric IOL OS (01.07.19 - R. Groat) - VA 1 day PO OS 20/30, VA OS (01.11.19) HM PMH-     CURRENT MEDICATIONS: Current Outpatient Medications (Ophthalmic Drugs)  Medication Sig  . bacitracin-polymyxin b (POLYSPORIN) ophthalmic ointment Place into the left eye at bedtime. Place a 1/2 inch ribbon of ointment into the lower eyelid.   . prednisoLONE acetate (PRED FORTE) 1 % ophthalmic suspension Place 1 drop into the left eye 2 (two) times daily.   No current facility-administered medications for this visit.  (Ophthalmic Drugs)   Current Outpatient Medications (Other)  Medication Sig  . Calcium Carbonate-Vitamin D (CALCIUM + D PO) Take 1 tablet by mouth daily.  . chlorhexidine (PERIDEX) 0.12 % solution Use as directed 15 mLs in the mouth or throat daily as needed (sores).   Marland Kitchen. dexlansoprazole (DEXILANT) 60 MG capsule Take 60 mg by mouth daily.  Marland Kitchen. docusate sodium 100 MG CAPS Take 100 mg by mouth 2 (two) times daily.  . fexofenadine (ALLEGRA) 30 MG tablet Take 30 mg by mouth as needed (allergies).   . indomethacin (INDOCIN SR) 75 MG CR capsule Take 75 mg by mouth 2 (two) times daily.  Marland Kitchen. LINZESS 145 MCG CAPS capsule   . pantoprazole (PROTONIX) 40 MG tablet TAKE 1 TABLET BY MOUTH ONCE DAILY FOR 30 DAYS  . PARoxetine (PAXIL) 20 MG tablet Take 20 mg by mouth every morning.  . propranolol ER (INDERAL LA) 80 MG 24 hr capsule Take 1 capsule (80 mg total) by mouth daily. (Patient taking differently: Take 60 mg by mouth daily. )  . rosuvastatin (CRESTOR) 5 MG tablet Take 5 mg by mouth daily.  . Vitamin D, Ergocalciferol, (DRISDOL) 50000 units  CAPS capsule    No current facility-administered medications for this visit.  (Other)      REVIEW OF SYSTEMS: ROS    Positive for: Eyes   Negative for: Constitutional, Gastrointestinal, Neurological, Skin, Genitourinary, Musculoskeletal, HENT, Endocrine, Cardiovascular, Respiratory, Psychiatric, Allergic/Imm, Heme/Lymph   Last edited by Celine Mans, COA on 12/26/2017  9:22 AM. (History)       ALLERGIES Allergies  Allergen Reactions  . Atorvastatin Other (See Comments)    Feels anxous  . Cortisone Other (See Comments)    headache  . Oxycodone Other (See Comments)    confusion  . Paroxetine Other (See Comments)    anxiety  . Penicillins Other (See Comments)    Unknown  per pt  . Pravastatin Other (See Comments)    muscle pain   . Singulair [Montelukast] Other (See Comments)    Chest pain  . Sulfa Antibiotics Nausea Only  . Aleve [Naproxen Sodium] Rash  . Augmentin [Amoxicillin-Pot Clavulanate] Rash  . Prednisone Other (See Comments)    headaches    PAST MEDICAL HISTORY Past Medical History:  Diagnosis Date  . Allergic rhinitis   . Chronic kidney disease   . Depression   . DJD (degenerative joint disease) of knee   . GERD (gastroesophageal reflux disease)   . Hyperlipidemia   . Migraine    Takes Propranolol  . Neuromuscular disorder (HCC)    Body tremors at times,h/o falling cast right arm  . Osteoporosis   . Tremors of nervous system   . Vitamin D deficiency    Past Surgical History:  Procedure Laterality Date  . KNEE SURGERY    . Left Foot Surgery  20 years ago   20 years ago  . ORIF PATELLA Left 02/12/2014   Procedure: OPEN REDUCTION INTERNAL (ORIF) FIXATION LEFT PATELLA;  Surgeon: [name], MD;  Location: [facility] ORS;  Service: Orthopedics;  Laterality: Left;  . PARS PLANA VITRECTOMY Left 02/21/2017   Procedure: PARS PLANA VITRECTOMY 25 GAUGE FOR ENDOPHTHALMITIS;  Surgeon: [name], MD;  Location: [facility] OR;  Service: Ophthalmology;  Laterality: Left;  . WRIST FRACTURE SURGERY      FAMILY HISTORY Family History  Problem Relation Age of Onset  . Breast cancer Neg Hx     SOCIAL HISTORY Social History   Tobacco Use  . Smoking status: Never Smoker  . Smokeless tobacco: Never Used  Substance Use Topics  . Alcohol use: No  . Drug use: No         OPHTHALMIC EXAM:  Base Eye Exam    Visual Acuity (Snellen - Linear)      Right Left   Dist cc 20/40 + 20/30   Dist ph cc 20/30 +2 NI   Correction:  Glasses       Tonometry (Tonopen, 9:30 AM)      Right Left   Pressure 11 13       Pupils      Dark Light Shape React APD   Right 3 2 Round Sluggish None   Left 6 6 Irregular No None       Visual Fields (Counting  fingers)      Left Right    Full Full       Extraocular Movement      Right Left    Full, Nystagmus Full, Nystagmus       Neuro/Psych    Oriented x3:  Yes   Mood/Affect:  Normal       Dilation  Both eyes:  1.0% Mydriacyl, 2.5% Phenylephrine @ 9:30 AM        Slit Lamp and Fundus Exam    Slit Lamp Exam      Right Left   Lids/Lashes Normal Normal   Conjunctiva/Sclera White and quiet White and quiet   Cornea Arcus, 3-4+ inferior Punctate epithelial erosions Arcus, trace Edema, trace descemet folds; nylon suture temporally; wounds seidel negative; 2+ PEE  - greatest inferiorly, dry tear film, irregular surface inferiorly   Anterior Chamber Deep and quiet No cell or pigment, no heme or fibrin   Iris Round and dilated Irregular pupil with temporal iridectomy/iridodialysis   Lens 3+ Nuclear sclerosis, 2-3+ Cortical cataract PCIOL displaced superiorly, trace pigment on IOL - improving   Vitreous Vitreous syneresis Post vitrectomy, mild pigmented debris inferiorly, no heme       Fundus Exam      Right Left   Disc Sharp tilted disc, inferior PPA   C/D Ratio 0.5 0.5   Macula Flat, Retinal pigment epithelial mottling, No heme or edema flat; blunted foveal reflex, Retinal pigment epithelial mottling, Nasal Epiretinal membrane   Vessels Normal Vascular attenuation   Periphery Attached attached          IMAGING AND PROCEDURES           ASSESSMENT/PLAN:    ICD-10-CM   1. 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 Pseudophakia, left eye Z96.1   4. Epiretinal membrane (ERM) of left eye H35.372   5. Retinal edema H35.81 OCT, Retina - OU - Both Eyes    1. Endophthalmitis OS- resolved - presented on POD4 s/p phaco/PCIOL on Monday, 1.7.19 - VA HM OS w/ 0.53mm hypopyon, fibrin membrane and irregular pupil, and dense vitreous opacities on b-scan ultrasound - s/p PPV + AC washout + intravitreal vanc, ceftaz, and cefepime, OS, on 1.11.19  -  extensive AC and posterior debris / purulent material noted intra op  - gram stain and culture positive for coag negative staph  - today, doing well with stable VA with just mild residual vit debris settling inferiorly  - BCVA stable at 20/30 OS             - IOP 13 OS today  - inferior nylon suture removed at slit lamp with betadine and polymixin drops post removal -- no complications (03.05.19)             - cont   PF Q daily OS  - cont  AT OU QID - f/u 3 months  2. Iridodialysis OS - sequelae of endophthalmitis and surgery - pt with significant glare symptoms, but tolerating for now - monitor - if symptoms worsen or fail to improve, consider referral to anterior segment surgeon for evaluation - discussed possibility of corneal tattoo to improve glare symptoms - pt wishes to think about it further -- will call back if decides to move forward with referral   3. Pseudophakia OS - s/p phaco/PCIOL OS on Monday, 11.7.19 - toric IOL displaced superiorly, but stable - do not recommend surgical intervention at this time  4,5 ERM OS - The natural history, anatomy, potential for loss of vision, and treatment options including vitrectomy techniques and the complications of endophthalmitis, retinal detachment, vitreous hemorrhage, cataract progression and permanent vision loss discussed with the patient. - mild nasal ERM OS with some pucker - stable from prior - do not recommend surgical intervention at this time - monitor  Ophthalmic Meds Ordered this visit:  No orders of the defined types were placed in this encounter.       Return in about 3 months (around 03/28/2018) for F/U Endophthalmitis OS.  There are no Patient Instructions on file for this visit.   Explained the diagnoses, plan, and follow up with the patient and they expressed understanding.  Patient expressed understanding of the importance of proper follow up care.   This document serves as a record of services  personally performed by Karie Chimera, MD, PhD. It was created on their behalf by Laurian Brim, OA, an ophthalmic assistant. The creation of this record is the provider's dictation and/or activities during the visit.    Electronically signed by: Laurian Brim, OA  11.13.19 12:23 AM     Karie Chimera, M.D., Ph.D. Diseases & Surgery of the Retina and Vitreous [facility]   I have reviewed the above documentation for accuracy and completeness, and I agree with the above. Karie Chimera, M.D., Ph.D. 12/30/17 12:23 AM   Abbreviations: M myopia (nearsighted); A astigmatism; H hyperopia (farsighted); P presbyopia; Mrx spectacle prescription;  CTL contact lenses; OD right eye; OS left eye; OU both eyes  XT exotropia; ET esotropia; PEK punctate epithelial keratitis; PEE punctate epithelial erosions; DES dry eye syndrome; MGD meibomian gland dysfunction; ATs artificial tears; PFAT's preservative free artificial tears; NSC nuclear sclerotic cataract; PSC posterior subcapsular cataract; ERM epi-retinal membrane; PVD posterior vitreous detachment; RD retinal detachment; DM diabetes mellitus; DR diabetic retinopathy; NPDR non-proliferative diabetic retinopathy; PDR proliferative diabetic retinopathy; CSME clinically significant macular edema; DME diabetic macular edema; dbh dot blot hemorrhages; CWS cotton wool spot; POAG primary open angle glaucoma; C/D cup-to-disc ratio; HVF humphrey visual field; GVF goldmann visual field; OCT optical coherence tomography; IOP intraocular pressure; BRVO Branch retinal vein occlusion; CRVO central retinal vein occlusion; CRAO central retinal artery occlusion; BRAO branch retinal artery occlusion; RT retinal tear; SB scleral buckle; PPV pars plana vitrectomy; VH Vitreous hemorrhage; PRP panretinal laser photocoagulation; IVK intravitreal kenalog; VMT vitreomacular traction; MH Macular hole;  NVD neovascularization of the disc; NVE neovascularization  elsewhere; AREDS age related eye disease study; ARMD age related macular degeneration; POAG primary open angle glaucoma; EBMD epithelial/anterior basement membrane dystrophy; ACIOL anterior chamber intraocular lens; IOL intraocular lens; PCIOL posterior chamber intraocular lens; Phaco/IOL phacoemulsification with intraocular lens placement; PRK photorefractive keratectomy; LASIK laser assisted in situ keratomileusis; HTN hypertension; DM diabetes mellitus; COPD chronic obstructive pulmonary disease

## 2017-12-26 ENCOUNTER — Ambulatory Visit (INDEPENDENT_AMBULATORY_CARE_PROVIDER_SITE_OTHER): Payer: Medicare Other | Admitting: Ophthalmology

## 2017-12-26 ENCOUNTER — Encounter (INDEPENDENT_AMBULATORY_CARE_PROVIDER_SITE_OTHER): Payer: Self-pay | Admitting: Ophthalmology

## 2017-12-26 DIAGNOSIS — H3581 Retinal edema: Secondary | ICD-10-CM | POA: Diagnosis not present

## 2017-12-26 DIAGNOSIS — H21532 Iridodialysis, left eye: Secondary | ICD-10-CM

## 2017-12-26 DIAGNOSIS — H35372 Puckering of macula, left eye: Secondary | ICD-10-CM

## 2017-12-26 DIAGNOSIS — H44002 Unspecified purulent endophthalmitis, left eye: Secondary | ICD-10-CM | POA: Diagnosis not present

## 2017-12-26 DIAGNOSIS — Z961 Presence of intraocular lens: Secondary | ICD-10-CM | POA: Diagnosis not present

## 2017-12-30 ENCOUNTER — Encounter (INDEPENDENT_AMBULATORY_CARE_PROVIDER_SITE_OTHER): Payer: Self-pay | Admitting: Ophthalmology

## 2018-03-29 NOTE — Progress Notes (Signed)
.  [facility] - Clinic Note  03/30/2018     CHIEF COMPLAINT Patient presents for Retina Follow Up s/p phaco/PCIOL OS, 1.7.19, rg S/p PPV/AC washout/intravitreal vanc, ceftaz, cefepime OS, 1.11.19, bz  HISTORY OF PRESENT ILLNESS: Jennifer Estrada is a 73 y.o. female who presents to the clinic today for:   HPI    Retina Follow Up    Patient presents with  Other.  In both eyes.  This started 9 months ago.  Severity is mild.  Since onset it is stable.  I, the attending physician,  performed the HPI with the patient and updated documentation appropriately.          Comments    F/U Endophthalmitis OS, Patient states her vision "is not any better or any worse", and "my eyes are very sensitive to sun light". Patient is using QD OS ,Tears PRN OS.        Last edited by [name], MD on 03/30/2018  9:30 AM. (History)    pt states she still has floaters and has gotten some polarized sunglasses to help with lights and glare, she states she wears them on top of her regular glasses and she has also gotten an anti-glare shield that goes on the visor of the car, she states between all of those things she is able to deal with the glare/lights  Referring physician: Merlene Laughter, MD [address] [place], [place] [address]  HISTORICAL INFORMATION:   Selected notes from the MEDICAL RECORD NUMBER Referred by Dr. Hortense Ramal for concern of endophthalmitis OS  LEE- 01.11.19  Ocular Hx- CE/toric IOL OS (01.07.19 - R. Groat) - VA 1 day PO OS 20/30, VA OS (01.11.19) HM PMH-     CURRENT MEDICATIONS: Current Outpatient Medications (Ophthalmic Drugs)  Medication Sig  . bacitracin-polymyxin b (POLYSPORIN) ophthalmic ointment Place into the left eye at bedtime. Place a 1/2 inch ribbon of ointment into the lower eyelid.  . prednisoLONE acetate (PRED FORTE) 1 % ophthalmic suspension Place 1 drop into the left eye 2 (two) times daily.   No current facility-administered  medications for this visit.  (Ophthalmic Drugs)   Current Outpatient Medications (Other)  Medication Sig  . Calcium Carbonate-Vitamin D (CALCIUM + D PO) Take 1 tablet by mouth daily.  . chlorhexidine (PERIDEX) 0.12 % solution Use as directed 15 mLs in the mouth or throat daily as needed (sores).   Marland Kitchen dexlansoprazole (DEXILANT) 60 MG capsule Take 60 mg by mouth daily.  Marland Kitchen docusate sodium 100 MG CAPS Take 100 mg by mouth 2 (two) times daily.  . fexofenadine (ALLEGRA) 30 MG tablet Take 30 mg by mouth as needed (allergies).   . indomethacin (INDOCIN SR) 75 MG CR capsule Take 75 mg by mouth 2 (two) times daily.  Marland Kitchen LINZESS 145 MCG CAPS capsule   . pantoprazole (PROTONIX) 40 MG tablet TAKE 1 TABLET BY MOUTH ONCE DAILY FOR 30 DAYS  . PARoxetine (PAXIL) 20 MG tablet Take 20 mg by mouth every morning.  . propranolol ER (INDERAL LA) 80 MG 24 hr capsule Take 1 capsule (80 mg total) by mouth daily. (Patient taking differently: Take 60 mg by mouth daily. )  . rosuvastatin (CRESTOR) 5 MG tablet Take 5 mg by mouth daily.  . Vitamin D, Ergocalciferol, (DRISDOL) 50000 units CAPS capsule    No current facility-administered medications for this visit.  (Other)      REVIEW OF SYSTEMS: ROS    Positive for:  Eyes   Negative for: Constitutional, Gastrointestinal, Neurological, Skin, Genitourinary, Musculoskeletal, HENT, Endocrine, Cardiovascular, Respiratory, Psychiatric, Allergic/Imm, Heme/Lymph   Last edited by Eldridge Scot, LPN on 3/55/7322  9:01 AM. (History)       ALLERGIES Allergies  Allergen Reactions  . Atorvastatin Other (See Comments)    Feels anxous  . Cortisone Other (See Comments)    headache  . Oxycodone Other (See Comments)    confusion  . Paroxetine Other (See Comments)    anxiety  . Penicillins Other (See Comments)    Unknown per pt  . Pravastatin Other (See Comments)    muscle pain   . Singulair [Montelukast] Other (See Comments)    Chest pain  . Sulfa Antibiotics Nausea  Only  . Aleve [Naproxen Sodium] Rash  . Augmentin [Amoxicillin-Pot Clavulanate] Rash  . Prednisone Other (See Comments)    headaches    PAST MEDICAL HISTORY Past Medical History:  Diagnosis Date  . Allergic rhinitis   . Chronic kidney disease   . Depression   . DJD (degenerative joint disease) of knee   . GERD (gastroesophageal reflux disease)   . Hyperlipidemia   . Migraine    Takes Propranolol  . Neuromuscular disorder (HCC)    Body tremors at times,h/o falling cast right arm  . Osteoporosis   . Tremors of nervous system   . Vitamin D deficiency    Past Surgical History:  Procedure Laterality Date  . KNEE SURGERY    . Left Foot Surgery  20 years ago   20 years ago  . ORIF PATELLA Left 02/12/2014   Procedure: OPEN REDUCTION INTERNAL (ORIF) FIXATION LEFT PATELLA;  Surgeon: [name], MD;  Location: [facility] ORS;  Service: Orthopedics;  Laterality: Left;  . PARS PLANA VITRECTOMY Left 02/21/2017   Procedure: PARS PLANA VITRECTOMY 25 GAUGE FOR ENDOPHTHALMITIS;  Surgeon: [name], MD;  Location: [facility] OR;  Service: Ophthalmology;  Laterality: Left;  . WRIST FRACTURE SURGERY      FAMILY HISTORY Family History  Problem Relation Age of Onset  . Breast cancer Neg Hx     SOCIAL HISTORY Social History   Tobacco Use  . Smoking status: Never Smoker  . Smokeless tobacco: Never Used  Substance Use Topics  . Alcohol use: No  . Drug use: No         OPHTHALMIC EXAM:  Base Eye Exam    Visual Acuity (Snellen - Linear)      Right Left   Dist cc 20/40 +2 20/30   Dist ph cc NI 20/25 -2   Correction:  Glasses       Tonometry (Tonopen, 9:12 AM)      Right Left   Pressure 15 18   Unable to assess:  Yes       Pupils      Dark Light Shape React APD   Right 3 2 Round Brisk None   Left 5  Round  None       Visual Fields (Counting fingers)      Left Right    Full Full       Extraocular Movement      Right Left    Full Full       Neuro/Psych    Oriented x3:   Yes   Mood/Affect:  Normal       Dilation    Both eyes:  1.0% Mydriacyl, 2.5% Phenylephrine @ 9:05 AM        Slit Lamp and Fundus Exam  Slit Lamp Exam      Right Left   Lids/Lashes Normal Normal   Conjunctiva/Sclera White and quiet White and quiet   Cornea Arcus, 1+ inferior Punctate epithelial erosions Arcus, trace Edema, trace descemet folds; nylon suture temporally; wounds seidel negative; 2+ PEE  - greatest inferiorly, dry tear film, irregular surface inferiorly   Anterior Chamber Deep and quiet No cell or pigment, no heme or fibrin   Iris Round and dilated Irregular pupil with temporal iridectomy/iridodialysis   Lens 3+ Nuclear sclerosis, mild brunescence, 3+ Cortical cataract PCIOL displaced superiorly, trace pigment on IOL - improving   Vitreous Vitreous syneresis, Posterior vitreous detachment Post vitrectomy, interval improvement in vitreous debris, settled inferiorly, no heme       Fundus Exam      Right Left   Disc Tilted disc, mild temporal PPA tilted disc, inferior PPA   C/D Ratio 0.6 0.5   Macula Flat, blunted foveal reflex, Retinal pigment epithelial mottling, No heme or edema flat; blunted foveal reflex, ERM with mild striae superiorly, Retinal pigment epithelial mottling, No heme    Vessels Vascular attenuation Vascular attenuation   Periphery Attached attached          IMAGING AND PROCEDURES           ASSESSMENT/PLAN:    ICD-10-CM   1. 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 Pseudophakia, left eye Z96.1   4. Epiretinal membrane (ERM) of left eye H35.372   5. Retinal edema H35.81 OCT, Retina - OU - Both Eyes  6. Vitreous hemorrhage of left eye (HCC) H43.12     1. Endophthalmitis OS- resolved - presented on POD4 s/p phaco/PCIOL on Monday, 1.7.19 - VA HM OS w/ 0.88mm hypopyon, fibrin membrane and irregular pupil, and dense vitreous opacities  on b-scan ultrasound - s/p PPV + AC washout + intravitreal vanc, ceftaz, and cefepime, OS, on 1.11.19  - extensive AC and posterior debris / purulent material noted intra op  - gram stain and culture positive for coag negative staph  - today, doing well with improved VA with just mild residual vit debris settling inferiorly  - BCVA improved to 20/25 OS!!!             - IOP 18 OS today  - inferior nylon suture removed at slit lamp with betadine and polymixin drops post removal -- no complications (03.05.19)             - cont   PF Q daily OS  - cont  AT OU QID - f/u 4 months  2. Iridodialysis OS - sequelae of endophthalmitis and surgery - pt with significant glare symptoms, but tolerating for now - monitor - if symptoms worsen or fail to improve, consider referral to anterior segment surgeon for evaluation - discussed possibility of corneal tattoo to improve glare symptoms - pt wishes to think about it further -- will call back if decides to move forward with referral   3. Pseudophakia OS - s/p phaco/PCIOL OS on Monday, 11.7.19 - toric IOL displaced superiorly, but stable - do not recommend surgical intervention at this time  4,5 ERM OS - The natural history, anatomy, potential for loss of vision, and treatment options including vitrectomy techniques and the complications of endophthalmitis, retinal detachment, vitreous hemorrhage, cataract progression and permanent vision loss discussed with the patient. - mild nasal ERM OS with some pucker - stable from  prior - do not recommend surgical intervention at this time - monitor  6. Mixed cataract OD - The symptoms of cataract, surgical options, and treatments and risks were discussed with patient. - discussed diagnosis and progression - approaching visual significance - discussed possible referral for cataract evaluation to Dr. Baker Pierini -- pt wishes to defer for now - to to call back if she wishes to move forward with  referral   Ophthalmic Meds Ordered this visit:  No orders of the defined types were placed in this encounter.     Return in about 4 months (around 07/29/2018) for Endophthalmitis OS, DFE, OCT.  There are no Patient Instructions on file for this visit.   Explained the diagnoses, plan, and follow up with the patient and they expressed understanding.  Patient expressed understanding of the importance of proper follow up care.   This document serves as a record of services personally performed by Karie Chimera, MD, PhD. It was created on their behalf by Laurian Brim, OA, an ophthalmic assistant. The creation of this record is the provider's dictation and/or activities during the visit.    Electronically signed by: Laurian Brim, OA 02.16.2020 12:52 PM    Karie Chimera, M.D., Ph.D. Diseases & Surgery of the Retina and Vitreous [facility]  I have reviewed the above documentation for accuracy and completeness, and I agree with the above. Karie Chimera, M.D., Ph.D. 03/30/18 12:58 PM   Abbreviations: M myopia (nearsighted); A astigmatism; H hyperopia (farsighted); P presbyopia; Mrx spectacle prescription;  CTL contact lenses; OD right eye; OS left eye; OU both eyes  XT exotropia; ET esotropia; PEK punctate epithelial keratitis; PEE punctate epithelial erosions; DES dry eye syndrome; MGD meibomian gland dysfunction; ATs artificial tears; PFAT's preservative free artificial tears; NSC nuclear sclerotic cataract; PSC posterior subcapsular cataract; ERM epi-retinal membrane; PVD posterior vitreous detachment; RD retinal detachment; DM diabetes mellitus; DR diabetic retinopathy; NPDR non-proliferative diabetic retinopathy; PDR proliferative diabetic retinopathy; CSME clinically significant macular edema; DME diabetic macular edema; dbh dot blot hemorrhages; CWS cotton wool spot; POAG primary open angle glaucoma; C/D cup-to-disc ratio; HVF humphrey visual field; GVF goldmann  visual field; OCT optical coherence tomography; IOP intraocular pressure; BRVO Branch retinal vein occlusion; CRVO central retinal vein occlusion; CRAO central retinal artery occlusion; BRAO branch retinal artery occlusion; RT retinal tear; SB scleral buckle; PPV pars plana vitrectomy; VH Vitreous hemorrhage; PRP panretinal laser photocoagulation; IVK intravitreal kenalog; VMT vitreomacular traction; MH Macular hole;  NVD neovascularization of the disc; NVE neovascularization elsewhere; AREDS age related eye disease study; ARMD age related macular degeneration; POAG primary open angle glaucoma; EBMD epithelial/anterior basement membrane dystrophy; ACIOL anterior chamber intraocular lens; IOL intraocular lens; PCIOL posterior chamber intraocular lens; Phaco/IOL phacoemulsification with intraocular lens placement; PRK photorefractive keratectomy; LASIK laser assisted in situ keratomileusis; HTN hypertension; DM diabetes mellitus; COPD chronic obstructive pulmonary disease

## 2018-03-30 ENCOUNTER — Ambulatory Visit (INDEPENDENT_AMBULATORY_CARE_PROVIDER_SITE_OTHER): Payer: Medicare Other | Admitting: Ophthalmology

## 2018-03-30 ENCOUNTER — Encounter (INDEPENDENT_AMBULATORY_CARE_PROVIDER_SITE_OTHER): Payer: Self-pay | Admitting: Ophthalmology

## 2018-03-30 DIAGNOSIS — H3581 Retinal edema: Secondary | ICD-10-CM | POA: Diagnosis not present

## 2018-03-30 DIAGNOSIS — H35372 Puckering of macula, left eye: Secondary | ICD-10-CM | POA: Diagnosis not present

## 2018-03-30 DIAGNOSIS — H21532 Iridodialysis, left eye: Secondary | ICD-10-CM

## 2018-03-30 DIAGNOSIS — Z961 Presence of intraocular lens: Secondary | ICD-10-CM | POA: Diagnosis not present

## 2018-03-30 DIAGNOSIS — H44002 Unspecified purulent endophthalmitis, left eye: Secondary | ICD-10-CM | POA: Diagnosis not present

## 2018-03-30 DIAGNOSIS — H25811 Combined forms of age-related cataract, right eye: Secondary | ICD-10-CM

## 2018-04-14 ENCOUNTER — Telehealth (INDEPENDENT_AMBULATORY_CARE_PROVIDER_SITE_OTHER): Payer: Self-pay

## 2018-08-04 ENCOUNTER — Encounter (INDEPENDENT_AMBULATORY_CARE_PROVIDER_SITE_OTHER): Payer: Medicare Other | Admitting: Ophthalmology

## 2018-08-09 NOTE — Progress Notes (Signed)
.  [facility] - Clinic Note  08/10/2018     CHIEF COMPLAINT Patient presents for Retina Follow Up s/p phaco/PCIOL OS, 1.7.19, rg S/p PPV/AC washout/intravitreal vanc, ceftaz, cefepime OS, 1.11.19, bz  HISTORY OF PRESENT ILLNESS: Jennifer Estrada is a 73 y.o. female who presents to the clinic today for:   HPI    Retina Follow Up    Patient presents with  Other.  In left eye.  This started 10 months ago.  Severity is mild.  Since onset it is stable.  I, the attending physician,  performed the HPI with the patient and updated documentation appropriately.          Comments    F/U endophthalmitis os. Patient states her vision is about the same  as last ov, denies new visual onsets.  Patient is using PF QD OS.         Last edited by Rennis ChrisZamora, Kieu Quiggle, MD on 08/10/2018  2:57 PM. (History)    pt states she feels like her vision is worse,  She states with both eyes open, and no glasses on she feels like she sees better than with individual eyes  Referring physician: Merlene LaughterStoneking, Hal, MD [address] [place] [id number]  HISTORICAL INFORMATION:   Selected notes from the MEDICAL RECORD NUMBER Referred by Dr. Hortense Ramal. Groat for concern of endophthalmitis OS  LEE- 01.11.19  Ocular Hx- CE/toric IOL OS (01.07.19 - R. Groat) - VA 1 day PO OS 20/30, VA OS (01.11.19) HM PMH-     CURRENT MEDICATIONS: Current Outpatient Medications (Ophthalmic Drugs)  Medication Sig  . bacitracin-polymyxin b (POLYSPORIN) ophthalmic ointment Place into the left eye at bedtime. Place a 1/2 inch ribbon of ointment into the lower eyelid.  . prednisoLONE acetate (PRED FORTE) 1 % ophthalmic suspension Place 1 drop into the left eye 2 (two) times daily.   No current facility-administered medications for this visit.  (Ophthalmic Drugs)   Current Outpatient Medications (Other)  Medication Sig  . Calcium Carbonate-Vitamin D (CALCIUM + D PO) Take 1 tablet by mouth daily.  .  chlorhexidine (PERIDEX) 0.12 % solution Use as directed 15 mLs in the mouth or throat daily as needed (sores).   Marland Kitchen. dexlansoprazole (DEXILANT) 60 MG capsule Take 60 mg by mouth daily.  Marland Kitchen. docusate sodium 100 MG CAPS Take 100 mg by mouth 2 (two) times daily.  . fexofenadine (ALLEGRA) 30 MG tablet Take 30 mg by mouth as needed (allergies).   . indomethacin (INDOCIN SR) 75 MG CR capsule Take 75 mg by mouth 2 (two) times daily.  Marland Kitchen. LINZESS 145 MCG CAPS capsule   . pantoprazole (PROTONIX) 40 MG tablet TAKE 1 TABLET BY MOUTH ONCE DAILY FOR 30 DAYS  . PARoxetine (PAXIL) 20 MG tablet Take 20 mg by mouth every morning.  . propranolol ER (INDERAL LA) 80 MG 24 hr capsule Take 1 capsule (80 mg total) by mouth daily. (Patient taking differently: Take 60 mg by mouth daily. )  . rosuvastatin (CRESTOR) 5 MG tablet Take 5 mg by mouth daily.  . Vitamin D, Ergocalciferol, (DRISDOL) 50000 units CAPS capsule    No current facility-administered medications for this visit.  (Other)      REVIEW OF SYSTEMS: ROS    Positive for: Eyes   Negative for: Constitutional, Gastrointestinal, Neurological, Skin, Genitourinary, Musculoskeletal, HENT, Endocrine, Cardiovascular, Respiratory, Psychiatric, Allergic/Imm, Heme/Lymph   Last edited by Eldridge ScotKendrick, Glenda, LPN on 6/04/54096/29/2020  2:34 PM. (History)  ALLERGIES Allergies  Allergen Reactions  . Atorvastatin Other (See Comments)    Feels anxous  . Cortisone Other (See Comments)    headache  . Oxycodone Other (See Comments)    confusion  . Paroxetine Other (See Comments)    anxiety  . Penicillins Other (See Comments)    Unknown per pt  . Pravastatin Other (See Comments)    muscle pain   . Singulair [Montelukast] Other (See Comments)    Chest pain  . Sulfa Antibiotics Nausea Only  . Aleve [Naproxen Sodium] Rash  . Augmentin [Amoxicillin-Pot Clavulanate] Rash  . Prednisone Other (See Comments)    headaches    PAST MEDICAL HISTORY Past Medical History:   Diagnosis Date  . Allergic rhinitis   . Chronic kidney disease   . Depression   . DJD (degenerative joint disease) of knee   . GERD (gastroesophageal reflux disease)   . Hyperlipidemia   . Migraine    Takes Propranolol  . Neuromuscular disorder (HCC)    Body tremors at times,h/o falling cast right arm  . Osteoporosis   . Tremors of nervous system   . Vitamin D deficiency    Past Surgical History:  Procedure Laterality Date  . KNEE SURGERY    . Left Foot Surgery  20 years ago   20 years ago  . ORIF PATELLA Left 02/12/2014   Procedure: OPEN REDUCTION INTERNAL (ORIF) FIXATION LEFT PATELLA;  Surgeon: Shelda PalMatthew D Olin, MD;  Location: [facility];  Service: Orthopedics;  Laterality: Left;  . PARS PLANA VITRECTOMY Left 02/21/2017   Procedure: PARS PLANA VITRECTOMY 25 GAUGE FOR ENDOPHTHALMITIS;  Surgeon: Rennis ChrisZamora, Any Mcneice, MD;  [facility];  Service: Ophthalmology;  Laterality: Left;  . WRIST FRACTURE SURGERY      FAMILY HISTORY Family History  Problem Relation Age of Onset  . Breast cancer Neg Hx     SOCIAL HISTORY Social History   Tobacco Use  . Smoking status: Never Smoker  . Smokeless tobacco: Never Used  Substance Use Topics  . Alcohol use: No  . Drug use: No         OPHTHALMIC EXAM:  Base Eye Exam    Visual Acuity (Snellen - Linear)      Right Left   Dist cc 20/40 -2 20/50 +2   Dist ph cc 20/40 -1 20/40 -2   Correction: Glasses       Tonometry (Tonopen, 2:43 PM)      Right Left   Pressure 16 17       Pupils      Dark Light Shape React APD   Right 3 2 Round Brisk None   Left 3 2 Round Brisk None       Visual Fields (Counting fingers)      Left Right    Full Full       Extraocular Movement      Right Left    Full, Ortho Full, Ortho       Neuro/Psych    Oriented x3: Yes   Mood/Affect: Normal       Dilation    Both eyes: 1.0% Mydriacyl, 2.5% Phenylephrine @ 2:43 PM        Slit Lamp and Fundus Exam    Slit Lamp Exam      Right Left    Lids/Lashes Normal Normal   Conjunctiva/Sclera White and quiet White and quiet   Cornea Arcus, 1+ inferior Punctate epithelial erosions Arcus, trace Edema, trace descemet folds; nylon suture temporally; wounds  seidel negative; 2+ PEE  - greatest inferiorly, dry tear film, irregular surface inferiorly   Anterior Chamber Deep and quiet 0.5+pigment, no heme or fibrin   Iris Round and dilated Irregular pupil with temporal iridectomy/iridodialysis   Lens 3+ Nuclear sclerosis, +brunescence, 3+ Cortical cataract PCIOL displaced superiorly, trace pigment on IOL   Vitreous Vitreous syneresis, Posterior vitreous detachment Post vitrectomy, vitreous condensations and pigment settled inferiorly       Fundus Exam      Right Left   Disc Tilted disc, mild temporal PPA tilted disc, inferior PPA   C/D Ratio 0.6 0.5   Macula Flat, blunted foveal reflex, Retinal pigment epithelial mottling, No heme or edema flat; blunted foveal reflex, ERM with mild striae superiorly, Retinal pigment epithelial mottling, No heme    Vessels Vascular attenuation Vascular attenuation   Periphery Attached attached          IMAGING AND PROCEDURES           ASSESSMENT/PLAN:    ICD-10-CM   1. 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 Iridodialysis of left eye  H21.532   3. Pseudophakia, left eye  Z96.1   4. Epiretinal membrane (ERM) of left eye  H35.372   5. Retinal edema  H35.81 OCT, Retina - OU - Both Eyes  6. Combined forms of age-related cataract of right eye  H25.811   7. Vitreous hemorrhage of left eye (HCC)  H43.12     1. Endophthalmitis OS- resolved  - presented on POD4 s/p phaco/PCIOL on Monday, 1.7.19  - VA HM OS w/ 0.13mm hypopyon, fibrin membrane and irregular pupil, and dense vitreous opacities on b-scan ultrasound  - s/p PPV + AC washout + intravitreal vanc, ceftaz, and cefepime, OS, on 1.11.19  - extensive AC and posterior debris / purulent material noted intra op  - gram stain and culture positive for  coag negative staph  - today, doing well with stable VA with just mild residual vit debris settling inferiorly  - BCVA 20/40 today             - IOP 16 OS today  - inferior nylon suture removed at slit lamp with betadine and polymixin drops post removal -- no complications (41.66.06)             - cont   PF Q daily OS  - cont  AT OU QID  - f/u 6 months  2. Iridodialysis OS  - sequelae of endophthalmitis and surgery  - pt with significant glare symptoms, but tolerating for now  - monitor  - if symptoms worsen or fail to improve, consider referral to anterior segment surgeon for evaluation  - discussed possibility of corneal tattoo to improve glare symptoms  - pt wishes to think about it further -- will call back if decides to move forward with referral   3. Pseudophakia OS  - s/p phaco/PCIOL OS on Monday, 11.7.19  - toric IOL displaced superiorly, but stable  - do not recommend surgical intervention at this time  4,5 ERM OS  - mild nasal ERM OS with some pucker  - stable from prior  - do not recommend surgical intervention at this time  - monitor  6. Mixed cataract OD  - The symptoms of cataract, surgical options, and treatments and risks were discussed with patient.  - discussed diagnosis and progression  - approaching visual significance, but pt understandably anxious about undergoing cataract surgery again  - discussed referral Dr. Quentin Ore for cataract  eval -- pt wishes to move forward with referral   Ophthalmic Meds Ordered this visit:  No orders of the defined types were placed in this encounter.     Return in about 6 months (around 02/09/2019) for f/u endophthalmitis OS, DFE, OCT.  There are no Patient Instructions on file for this visit.   Explained the diagnoses, plan, and follow up with the patient and they expressed understanding.  Patient expressed understanding of the importance of proper follow up care.   This document serves as a record of services  personally performed by Karie ChimeraBrian G. Harlee Pursifull, MD, PhD. It was created on their behalf by Laurian BrimAmanda Brown, OA, an ophthalmic assistant. The creation of this record is the provider's dictation and/or activities during the visit.    Electronically signed by: Laurian BrimAmanda Brown, OA  06.28.2020 12:12 AM     Karie ChimeraBrian G. Zariya Minner, M.D., Ph.D. Diseases & Surgery of the Retina and Vitreous [facility]  I have reviewed the above documentation for accuracy and completeness, and I agree with the above. Karie ChimeraBrian G. Ashea Winiarski, M.D., Ph.D. 08/11/18 12:24 AM    Abbreviations: M myopia (nearsighted); A astigmatism; H hyperopia (farsighted); P presbyopia; Mrx spectacle prescription;  CTL contact lenses; OD right eye; OS left eye; OU both eyes  XT exotropia; ET esotropia; PEK punctate epithelial keratitis; PEE punctate epithelial erosions; DES dry eye syndrome; MGD meibomian gland dysfunction; ATs artificial tears; PFAT's preservative free artificial tears; NSC nuclear sclerotic cataract; PSC posterior subcapsular cataract; ERM epi-retinal membrane; PVD posterior vitreous detachment; RD retinal detachment; DM diabetes mellitus; DR diabetic retinopathy; NPDR non-proliferative diabetic retinopathy; PDR proliferative diabetic retinopathy; CSME clinically significant macular edema; DME diabetic macular edema; dbh dot blot hemorrhages; CWS cotton wool spot; POAG primary open angle glaucoma; C/D cup-to-disc ratio; HVF humphrey visual field; GVF goldmann visual field; OCT optical coherence tomography; IOP intraocular pressure; BRVO Branch retinal vein occlusion; CRVO central retinal vein occlusion; CRAO central retinal artery occlusion; BRAO branch retinal artery occlusion; RT retinal tear; SB scleral buckle; PPV pars plana vitrectomy; VH Vitreous hemorrhage; PRP panretinal laser photocoagulation; IVK intravitreal kenalog; VMT vitreomacular traction; MH Macular hole;  NVD neovascularization of the disc; NVE neovascularization  elsewhere; AREDS age related eye disease study; ARMD age related macular degeneration; POAG primary open angle glaucoma; EBMD epithelial/anterior basement membrane dystrophy; ACIOL anterior chamber intraocular lens; IOL intraocular lens; PCIOL posterior chamber intraocular lens; Phaco/IOL phacoemulsification with intraocular lens placement; PRK photorefractive keratectomy; LASIK laser assisted in situ keratomileusis; HTN hypertension; DM diabetes mellitus; COPD chronic obstructive pulmonary disease

## 2018-08-10 ENCOUNTER — Encounter (INDEPENDENT_AMBULATORY_CARE_PROVIDER_SITE_OTHER): Payer: Self-pay | Admitting: Ophthalmology

## 2018-08-10 ENCOUNTER — Ambulatory Visit (INDEPENDENT_AMBULATORY_CARE_PROVIDER_SITE_OTHER): Payer: Medicare Other | Admitting: Ophthalmology

## 2018-08-10 ENCOUNTER — Other Ambulatory Visit: Payer: Self-pay

## 2018-08-10 DIAGNOSIS — H3581 Retinal edema: Secondary | ICD-10-CM

## 2018-08-10 DIAGNOSIS — H44002 Unspecified purulent endophthalmitis, left eye: Secondary | ICD-10-CM

## 2018-08-10 DIAGNOSIS — Z961 Presence of intraocular lens: Secondary | ICD-10-CM

## 2018-08-10 DIAGNOSIS — H21532 Iridodialysis, left eye: Secondary | ICD-10-CM | POA: Diagnosis not present

## 2018-08-10 DIAGNOSIS — H35372 Puckering of macula, left eye: Secondary | ICD-10-CM

## 2018-08-10 DIAGNOSIS — H4312 Vitreous hemorrhage, left eye: Secondary | ICD-10-CM

## 2018-08-10 DIAGNOSIS — H25811 Combined forms of age-related cataract, right eye: Secondary | ICD-10-CM

## 2018-10-13 ENCOUNTER — Other Ambulatory Visit: Payer: Self-pay | Admitting: Endocrinology

## 2018-10-13 DIAGNOSIS — M8580 Other specified disorders of bone density and structure, unspecified site: Secondary | ICD-10-CM

## 2018-10-13 DIAGNOSIS — Z1231 Encounter for screening mammogram for malignant neoplasm of breast: Secondary | ICD-10-CM

## 2018-11-24 ENCOUNTER — Other Ambulatory Visit (INDEPENDENT_AMBULATORY_CARE_PROVIDER_SITE_OTHER): Payer: Self-pay | Admitting: Ophthalmology

## 2018-12-23 ENCOUNTER — Other Ambulatory Visit: Payer: Self-pay | Admitting: Endocrinology

## 2018-12-23 DIAGNOSIS — M810 Age-related osteoporosis without current pathological fracture: Secondary | ICD-10-CM

## 2018-12-25 ENCOUNTER — Ambulatory Visit
Admission: RE | Admit: 2018-12-25 | Discharge: 2018-12-25 | Disposition: A | Discharge status: Home / Self Care | Payer: Medicare Other | Source: Ambulatory Visit | Attending: Endocrinology | Admitting: Endocrinology

## 2018-12-25 ENCOUNTER — Other Ambulatory Visit: Payer: Self-pay

## 2018-12-25 DIAGNOSIS — M810 Age-related osteoporosis without current pathological fracture: Secondary | ICD-10-CM

## 2018-12-25 DIAGNOSIS — Z1231 Encounter for screening mammogram for malignant neoplasm of breast: Secondary | ICD-10-CM

## 2019-01-26 ENCOUNTER — Inpatient Hospital Stay (HOSPITAL_COMMUNITY)
Admission: EM | Admit: 2019-01-26 | Discharge: 2019-02-02 | DRG: 481 | Disposition: A | Discharge status: Skilled Nursing Facility | Payer: Medicare Other | Source: Ambulatory Visit | Attending: Internal Medicine | Admitting: Internal Medicine

## 2019-01-26 ENCOUNTER — Emergency Department (HOSPITAL_COMMUNITY): Payer: Medicare Other

## 2019-01-26 ENCOUNTER — Inpatient Hospital Stay (HOSPITAL_COMMUNITY): Payer: Medicare Other

## 2019-01-26 ENCOUNTER — Other Ambulatory Visit: Payer: Self-pay

## 2019-01-26 ENCOUNTER — Encounter (HOSPITAL_COMMUNITY): Payer: Self-pay | Admitting: Internal Medicine

## 2019-01-26 DIAGNOSIS — S01312A Laceration without foreign body of left ear, initial encounter: Secondary | ICD-10-CM | POA: Diagnosis present

## 2019-01-26 DIAGNOSIS — E785 Hyperlipidemia, unspecified: Secondary | ICD-10-CM | POA: Diagnosis present

## 2019-01-26 DIAGNOSIS — Z882 Allergy status to sulfonamides status: Secondary | ICD-10-CM

## 2019-01-26 DIAGNOSIS — N189 Chronic kidney disease, unspecified: Secondary | ICD-10-CM | POA: Diagnosis present

## 2019-01-26 DIAGNOSIS — Z8673 Personal history of transient ischemic attack (TIA), and cerebral infarction without residual deficits: Secondary | ICD-10-CM | POA: Diagnosis not present

## 2019-01-26 DIAGNOSIS — R251 Tremor, unspecified: Secondary | ICD-10-CM | POA: Diagnosis present

## 2019-01-26 DIAGNOSIS — Z20828 Contact with and (suspected) exposure to other viral communicable diseases: Secondary | ICD-10-CM | POA: Diagnosis present

## 2019-01-26 DIAGNOSIS — S72002A Fracture of unspecified part of neck of left femur, initial encounter for closed fracture: Secondary | ICD-10-CM | POA: Diagnosis not present

## 2019-01-26 DIAGNOSIS — R55 Syncope and collapse: Secondary | ICD-10-CM | POA: Diagnosis present

## 2019-01-26 DIAGNOSIS — D62 Acute posthemorrhagic anemia: Secondary | ICD-10-CM | POA: Diagnosis present

## 2019-01-26 DIAGNOSIS — N3941 Urge incontinence: Secondary | ICD-10-CM | POA: Diagnosis present

## 2019-01-26 DIAGNOSIS — F419 Anxiety disorder, unspecified: Secondary | ICD-10-CM | POA: Diagnosis present

## 2019-01-26 DIAGNOSIS — S7222XA Displaced subtrochanteric fracture of left femur, initial encounter for closed fracture: Secondary | ICD-10-CM | POA: Diagnosis present

## 2019-01-26 DIAGNOSIS — W1830XA Fall on same level, unspecified, initial encounter: Secondary | ICD-10-CM | POA: Diagnosis present

## 2019-01-26 DIAGNOSIS — I959 Hypotension, unspecified: Secondary | ICD-10-CM | POA: Diagnosis not present

## 2019-01-26 DIAGNOSIS — I129 Hypertensive chronic kidney disease with stage 1 through stage 4 chronic kidney disease, or unspecified chronic kidney disease: Secondary | ICD-10-CM | POA: Diagnosis present

## 2019-01-26 DIAGNOSIS — F329 Major depressive disorder, single episode, unspecified: Secondary | ICD-10-CM | POA: Diagnosis present

## 2019-01-26 DIAGNOSIS — Z419 Encounter for procedure for purposes other than remedying health state, unspecified: Secondary | ICD-10-CM

## 2019-01-26 DIAGNOSIS — J309 Allergic rhinitis, unspecified: Secondary | ICD-10-CM | POA: Diagnosis present

## 2019-01-26 DIAGNOSIS — E559 Vitamin D deficiency, unspecified: Secondary | ICD-10-CM | POA: Diagnosis present

## 2019-01-26 DIAGNOSIS — H269 Unspecified cataract: Secondary | ICD-10-CM | POA: Diagnosis present

## 2019-01-26 DIAGNOSIS — S72142A Displaced intertrochanteric fracture of left femur, initial encounter for closed fracture: Principal | ICD-10-CM | POA: Diagnosis present

## 2019-01-26 DIAGNOSIS — N179 Acute kidney failure, unspecified: Secondary | ICD-10-CM | POA: Diagnosis present

## 2019-01-26 DIAGNOSIS — M810 Age-related osteoporosis without current pathological fracture: Secondary | ICD-10-CM | POA: Diagnosis present

## 2019-01-26 DIAGNOSIS — Y92530 Ambulatory surgery center as the place of occurrence of the external cause: Secondary | ICD-10-CM | POA: Diagnosis present

## 2019-01-26 DIAGNOSIS — G43909 Migraine, unspecified, not intractable, without status migrainosus: Secondary | ICD-10-CM | POA: Diagnosis present

## 2019-01-26 DIAGNOSIS — Z888 Allergy status to other drugs, medicaments and biological substances status: Secondary | ICD-10-CM

## 2019-01-26 DIAGNOSIS — Z880 Allergy status to penicillin: Secondary | ICD-10-CM

## 2019-01-26 DIAGNOSIS — Z881 Allergy status to other antibiotic agents status: Secondary | ICD-10-CM | POA: Diagnosis not present

## 2019-01-26 DIAGNOSIS — K219 Gastro-esophageal reflux disease without esophagitis: Secondary | ICD-10-CM | POA: Diagnosis present

## 2019-01-26 DIAGNOSIS — Z885 Allergy status to narcotic agent status: Secondary | ICD-10-CM

## 2019-01-26 DIAGNOSIS — F32A Depression, unspecified: Secondary | ICD-10-CM | POA: Diagnosis present

## 2019-01-26 DIAGNOSIS — K5900 Constipation, unspecified: Secondary | ICD-10-CM | POA: Diagnosis not present

## 2019-01-26 DIAGNOSIS — W19XXXA Unspecified fall, initial encounter: Secondary | ICD-10-CM

## 2019-01-26 DIAGNOSIS — Z8262 Family history of osteoporosis: Secondary | ICD-10-CM

## 2019-01-26 DIAGNOSIS — G250 Essential tremor: Secondary | ICD-10-CM | POA: Diagnosis present

## 2019-01-26 LAB — CBC WITH DIFFERENTIAL/PLATELET
Abs Immature Granulocytes: 0.02 10*3/uL (ref 0.00–0.07)
Basophils Absolute: 0.1 10*3/uL (ref 0.0–0.1)
Basophils Relative: 1 %
Eosinophils Absolute: 0.1 10*3/uL (ref 0.0–0.5)
Eosinophils Relative: 2 %
HCT: 36.4 % (ref 36.0–46.0)
Hemoglobin: 11.3 g/dL — ABNORMAL LOW (ref 12.0–15.0)
Immature Granulocytes: 0 %
Lymphocytes Relative: 26 %
Lymphs Abs: 1.8 10*3/uL (ref 0.7–4.0)
MCH: 30.3 pg (ref 26.0–34.0)
MCHC: 31 g/dL (ref 30.0–36.0)
MCV: 97.6 fL (ref 80.0–100.0)
Monocytes Absolute: 0.6 10*3/uL (ref 0.1–1.0)
Monocytes Relative: 9 %
Neutro Abs: 4.4 10*3/uL (ref 1.7–7.7)
Neutrophils Relative %: 62 %
Platelets: 185 10*3/uL (ref 150–400)
RBC: 3.73 MIL/uL — ABNORMAL LOW (ref 3.87–5.11)
RDW: 13.7 % (ref 11.5–15.5)
WBC: 7.1 10*3/uL (ref 4.0–10.5)
nRBC: 0 % (ref 0.0–0.2)

## 2019-01-26 LAB — TYPE AND SCREEN
ABO/RH(D): O NEG
Antibody Screen: NEGATIVE

## 2019-01-26 LAB — BASIC METABOLIC PANEL
Anion gap: 11 (ref 5–15)
BUN: 14 mg/dL (ref 8–23)
CO2: 23 mmol/L (ref 22–32)
Calcium: 9.5 mg/dL (ref 8.9–10.3)
Chloride: 105 mmol/L (ref 98–111)
Creatinine, Ser: 1.01 mg/dL — ABNORMAL HIGH (ref 0.44–1.00)
GFR calc Af Amer: >60 mL/min (ref >60)
GFR calc non Af Amer: 55 mL/min — ABNORMAL LOW (ref >60)
Glucose, Bld: 92 mg/dL (ref 70–99)
Potassium: 4.5 mmol/L (ref 3.5–5.1)
Sodium: 139 mmol/L (ref 135–145)

## 2019-01-26 LAB — RESPIRATORY PANEL BY RT PCR (FLU A&B, COVID)
Influenza A by PCR: NEGATIVE
Influenza B by PCR: NEGATIVE
SARS Coronavirus 2 by RT PCR: NEGATIVE

## 2019-01-26 LAB — PROTIME-INR
INR: 1 (ref 0.8–1.2)
Prothrombin Time: 13 seconds (ref 11.4–15.2)

## 2019-01-26 LAB — ECHOCARDIOGRAM COMPLETE

## 2019-01-26 MED ORDER — METHOCARBAMOL 500 MG PO TABS: 500.0000 mg | ORAL_TABLET | Freq: Four times a day (QID) | ORAL | Status: DC | PRN

## 2019-01-26 MED ORDER — FENTANYL CITRATE (PF) 100 MCG/2ML IJ SOLN
50.0000 ug | INTRAMUSCULAR | Status: DC | PRN
  Administered 2019-01-26 (×3): 50 ug via INTRAVENOUS
  Administered 2019-01-27: 100 ug via INTRAVENOUS
  Administered 2019-01-28: 08:00:00 50 ug via INTRAVENOUS
  Filled 2019-01-26 (×4): qty 2

## 2019-01-26 MED ORDER — LIDOCAINE-EPINEPHRINE-TETRACAINE (LET) TOPICAL GEL
3.0000 mL | Freq: Once | TOPICAL | Status: AC
  Administered 2019-01-26: 3 mL via TOPICAL
  Filled 2019-01-26: qty 3

## 2019-01-26 MED ORDER — SENNA 8.6 MG PO TABS
1.0000 | ORAL_TABLET | Freq: Two times a day (BID) | ORAL | Status: DC
  Administered 2019-01-27 – 2019-02-02 (×13): 8.6 mg via ORAL
  Filled 2019-01-26 (×13): qty 1

## 2019-01-26 MED ORDER — METHOCARBAMOL 1000 MG/10ML IJ SOLN
500.0000 mg | Freq: Three times a day (TID) | INTRAVENOUS | Status: DC | PRN
  Filled 2019-01-26: qty 5

## 2019-01-26 MED ORDER — ENSURE PRE-SURGERY PO LIQD
296.0000 mL | Freq: Once | ORAL | Status: AC
  Administered 2019-01-27: 296 mL via ORAL
  Filled 2019-01-26: qty 296

## 2019-01-26 MED ORDER — CLINDAMYCIN PHOSPHATE 900 MG/50ML IV SOLN
900.0000 mg | INTRAVENOUS | Status: AC
  Administered 2019-01-27: 12:00:00 900 mg via INTRAVENOUS
  Filled 2019-01-26 (×2): qty 50

## 2019-01-26 MED ORDER — MAGNESIUM HYDROXIDE 400 MG/5ML PO SUSP
30.0000 mL | Freq: Every day | ORAL | Status: DC | PRN
  Administered 2019-02-01: 30 mL via ORAL
  Filled 2019-01-26: qty 30

## 2019-01-26 MED ORDER — CHLORHEXIDINE GLUCONATE 4 % EX LIQD
60.0000 mL | Freq: Once | CUTANEOUS | Status: AC
  Administered 2019-01-27: 4 via TOPICAL
  Filled 2019-01-26 (×2): qty 60

## 2019-01-26 MED ORDER — FENTANYL CITRATE (PF) 100 MCG/2ML IJ SOLN
25.0000 ug | INTRAMUSCULAR | Status: DC | PRN
  Administered 2019-01-26: 25 ug via INTRAVENOUS
  Filled 2019-01-26: qty 2

## 2019-01-26 MED ORDER — MUPIROCIN 2 % EX OINT
1.0000 "application " | TOPICAL_OINTMENT | Freq: Two times a day (BID) | CUTANEOUS | Status: AC
  Administered 2019-01-27 – 2019-01-31 (×9): 1 via NASAL
  Filled 2019-01-26 (×3): qty 22

## 2019-01-26 MED ORDER — POVIDONE-IODINE 10 % EX SWAB
2.0000 "application " | Freq: Once | CUTANEOUS | Status: AC
  Administered 2019-01-27: 2 via TOPICAL

## 2019-01-26 NOTE — Progress Notes (Signed)
  Echocardiogram 2D Echocardiogram has been performed.  Jennifer Estrada 01/26/2019, 3:25 PM

## 2019-01-26 NOTE — ED Triage Notes (Signed)
Transported by [facility] from [place]-- unwitnessed fall injuring LLE. +shortening, obvious deformity, swelling, pain. AAO x 4. EMS administered 100 mcg of Fentanyl IV PTA

## 2019-01-26 NOTE — ED Notes (Signed)
Patient transported to X-ray 

## 2019-01-26 NOTE — Consult Note (Signed)
Reason for Consult:Left hip fx Referring Physician: R Oriah Estrada is an 73 y.o. female.  HPI: Jennifer Estrada was ready to undergo eye surgery today. She stopped in the bathroom before going back. By her description she possibly had a syncopal event and fell. She had immediate left hip pain and could not get up. She was brought to the ED at [facility] for evaluation. X-rays showed a left hip fx and orthopedic surgery was consulted. She c/o localized pain to the hip. She notes she had a history of syncope with fall but it was corrected with a change in her propranalol dose.  Past Medical History:  Diagnosis Date  . Allergic rhinitis   . Chronic kidney disease   . Depression   . DJD (degenerative joint disease) of knee   . GERD (gastroesophageal reflux disease)   . Hyperlipidemia   . Migraine    Takes Propranolol  . Neuromuscular disorder ([place])    Body tremors at times,h/o falling cast right arm  . Osteoporosis   . Tremors of nervous system   . Vitamin D deficiency     Past Surgical History:  Procedure Laterality Date  . KNEE SURGERY    . Left Foot Surgery  20 years ago   20 years ago  . ORIF PATELLA Left 02/12/2014   Procedure: OPEN REDUCTION INTERNAL (ORIF) FIXATION LEFT PATELLA;  Surgeon: Mauri Pole, MD;  Location: [facility];  Service: Orthopedics;  Laterality: Left;  . PARS PLANA VITRECTOMY Left 02/21/2017   Procedure: PARS PLANA VITRECTOMY 25 GAUGE FOR ENDOPHTHALMITIS;  Surgeon: [name], MD;  Location: [facility];  Service: Ophthalmology;  Laterality: Left;  . WRIST FRACTURE SURGERY      Family History  Problem Relation Age of Onset  . Osteoporosis Father   . GER disease Father   . Breast cancer Neg Hx     Social History:  reports that she has never smoked. She has never used smokeless tobacco. She reports that she does not drink alcohol or use drugs.  Allergies:  Allergies  Allergen Reactions  . Atorvastatin Other (See Comments)    Feels anxous  . Cortisone Other  (See Comments)    headache  . Oxycodone Other (See Comments)    confusion  . Paroxetine Other (See Comments)    anxiety  . Penicillins Other (See Comments)    Unknown per pt  . Pravastatin Other (See Comments)    muscle pain   . Singulair [Montelukast] Other (See Comments)    Chest pain  . Sulfa Antibiotics Nausea Only  . Aleve [Naproxen Sodium] Rash  . Augmentin [Amoxicillin-Pot Clavulanate] Rash  . Prednisone Other (See Comments)    headaches    Medications: I have reviewed the patient's current medications.  Results for orders placed or performed during the hospital encounter of 01/26/19 (from the past 48 hour(s))  Basic metabolic panel     Status: Abnormal   Collection Time: 01/26/19  9:31 AM  Result Value Ref Range   Sodium 139 135 - 145 mmol/L   Potassium 4.5 3.5 - 5.1 mmol/L   Chloride 105 98 - 111 mmol/L   CO2 23 22 - 32 mmol/L   Glucose, Bld 92 70 - 99 mg/dL   BUN 14 8 - 23 mg/dL   Creatinine, Ser 1.01 (H) 0.44 - 1.00 mg/dL   Calcium 9.5 8.9 - 10.3 mg/dL   GFR calc non Af Amer 55 (L) >60 mL/min   GFR calc Af Amer >60 >60  mL/min   Anion gap 11 5 - 15    Comment: Performed at [facility], [address], [place], [place] [address]  CBC WITH DIFFERENTIAL     Status: Abnormal   Collection Time: 01/26/19  9:31 AM  Result Value Ref Range   WBC 7.1 4.0 - 10.5 K/uL   RBC 3.73 (L) 3.87 - 5.11 MIL/uL   Hemoglobin 11.3 (L) 12.0 - 15.0 g/dL   HCT 96.2 95.2 - 84.1 %   MCV 97.6 80.0 - 100.0 fL   MCH 30.3 26.0 - 34.0 pg   MCHC 31.0 30.0 - 36.0 g/dL   RDW 32.4 40.1 - 02.7 %   Platelets 185 150 - 400 K/uL   nRBC 0.0 0.0 - 0.2 %   Neutrophils Relative % 62 %   Neutro Abs 4.4 1.7 - 7.7 K/uL   Lymphocytes Relative 26 %   Lymphs Abs 1.8 0.7 - 4.0 K/uL   Monocytes Relative 9 %   Monocytes Absolute 0.6 0.1 - 1.0 K/uL   Eosinophils Relative 2 %   Eosinophils Absolute 0.1 0.0 - 0.5 K/uL   Basophils Relative 1 %   Basophils Absolute 0.1 0.0 - 0.1 K/uL    Immature Granulocytes 0 %   Abs Immature Granulocytes 0.02 0.00 - 0.07 K/uL    Comment: Performed at [facility], [address], [place], [place] [address]  Protime-INR     Status: None   Collection Time: 01/26/19  9:31 AM  Result Value Ref Range   Prothrombin Time 13.0 11.4 - 15.2 seconds   INR 1.0 0.8 - 1.2    Comment: (NOTE) INR goal varies based on device and disease states. Performed at [facility], [address] [place], [place] [address]   Type and screen [facility]     Status: None   Collection Time: 01/26/19 11:06 AM  Result Value Ref Range   ABO/RH(D) O NEG    Antibody Screen NEG    Sample Expiration      01/29/2019,2359 Performed at [facility], [address], [place], [place] [address]   Respiratory Panel by RT PCR (Flu A&B, Covid) - Nasopharyngeal Swab     Status: None   Collection Time: 01/26/19 11:44 AM   Specimen: Nasopharyngeal Swab  Result Value Ref Range   SARS Coronavirus 2 by RT PCR NEGATIVE NEGATIVE    Comment: (NOTE) SARS-CoV-2 target nucleic acids are NOT DETECTED. The SARS-CoV-2 RNA is generally detectable in upper respiratoy specimens during the acute phase of infection. The lowest concentration of SARS-CoV-2 viral copies this assay can detect is 131 copies/mL. A negative result does not preclude SARS-Cov-2 infection and should not be used as the sole basis for treatment or other patient management decisions. A negative result may occur with  improper specimen collection/handling, submission of specimen other than nasopharyngeal swab, presence of viral mutation(s) within the areas targeted by this assay, and inadequate number of viral copies (<131 copies/mL). A negative result must be combined with clinical observations, patient history, and epidemiological information. The expected result is Negative. Fact Sheet for Patients:   https://www.moore.com/ Fact Sheet for Healthcare Providers:  https://www.young.biz/ This test is not yet ap proved or cleared by the [place] FDA and  has been authorized for detection and/or diagnosis of SARS-CoV-2 by FDA under an Emergency Use Authorization (EUA). This EUA will remain  in effect (meaning this test can be used) for the duration of the COVID-19  declaration under Section 564(b)(1) of the Act, 21 U.S.C. section 360bbb-3(b)(1), unless the authorization is terminated or revoked sooner.    Influenza A by PCR NEGATIVE NEGATIVE   Influenza B by PCR NEGATIVE NEGATIVE    Comment: (NOTE) The Xpert Xpress SARS-CoV-2/FLU/RSV assay is intended as an aid in  the diagnosis of influenza from Nasopharyngeal swab specimens and  should not be used as a sole basis for treatment. Nasal washings and  aspirates are unacceptable for Xpert Xpress SARS-CoV-2/FLU/RSV  testing. Fact Sheet for Patients: https://www.moore.com/https://www.fda.gov/media/142436/download Fact Sheet for Healthcare Providers: https://www.young.biz/https://www.fda.gov/media/142435/download This test is not yet approved or cleared by the [place] FDA and  has been authorized for detection and/or diagnosis of SARS-CoV-2 by  FDA under an Emergency Use Authorization (EUA). This EUA will remain  in effect (meaning this test can be used) for the duration of the  Covid-19 declaration under Section 564(b)(1) of the Act, 21  U.S.C. section 360bbb-3(b)(1), unless the authorization is  terminated or revoked. Performed at [facility], [address] [place], [place] [id number]     DG Chest 1 View  Result Date: 01/26/2019 CLINICAL DATA:  [facility]-- pt has a unwitnessed fall injuring LLE. +shortening, obvious deformity, swelling, pain. No prior injury to L hip. EXAM: CHEST  1 VIEW COMPARISON:  05/02/2012 FINDINGS:  Cardiac silhouette is normal in size. No mediastinal or hilar masses. No evidence of adenopathy. Mild scarring noted at the lung apices. Lungs otherwise clear. No pleural effusion or pneumothorax. Skeletal structures are grossly intact. IMPRESSION: No acute cardiopulmonary disease. Electronically Signed   By: Amie Portlandavid  Ormond M.D.   On: 01/26/2019 10:11   CT Head Wo Contrast  Result Date: 01/26/2019 CLINICAL DATA:  Unwitnessed fall. EXAM: CT HEAD WITHOUT CONTRAST TECHNIQUE: Contiguous axial images were obtained from the base of the skull through the vertex without intravenous contrast. COMPARISON:  February 07, 2014. FINDINGS: Brain: Mild chronic ischemic white matter disease is noted. No mass effect or midline shift is noted. Ventricular size is within normal limits. There is no evidence of mass lesion, hemorrhage or acute infarction. Vascular: No hyperdense vessel or unexpected calcification. Skull: Normal. Negative for fracture or focal lesion. Sinuses/Orbits: No acute finding. Other: None. IMPRESSION: Mild chronic ischemic white matter disease. No acute intracranial abnormality seen. Electronically Signed   By: Lupita RaiderJames  Green Jr M.D.   On: 01/26/2019 10:21   DG Hip Unilat With Pelvis 2-3 Views Left  Result Date: 01/26/2019 CLINICAL DATA:  [facility] was about to have cataract surgery-- pt has a unwitnessed fall injuring LLE. +shortening, obvious deformity, swelling, pain. No prior injury to L hip. EXAM: DG HIP (WITH OR WITHOUT PELVIS) 2-3V LEFT COMPARISON:  None. FINDINGS: Intertrochanteric fracture of the proximal left femur. A fracture line crosses base of the femoral neck with another extending superior to the lesser trochanter to the lateral proximal femoral metaphysis. Proximal fracture component is displaced laterally by 1.7 cm, with mild varus angulation. No other fractures.  Hip joints normally spaced and aligned. IMPRESSION: 1. Mildly displaced  and varus angulated intertrochanteric fracture of the proximal left femur. No dislocation. Electronically Signed   By: Amie Portlandavid  Ormond M.D.   On: 01/26/2019 10:13    Review of Systems  HENT: Negative for ear discharge, ear pain, hearing loss and tinnitus.   Eyes: Negative for photophobia and pain.  Respiratory: Negative for  cough and shortness of breath.   Cardiovascular: Negative for chest pain.  Gastrointestinal: Negative for abdominal pain, nausea and vomiting.  Genitourinary: Negative for dysuria, flank pain, frequency and urgency.  Musculoskeletal: Positive for arthralgias (Left hip). Negative for back pain, myalgias and neck pain.  Neurological: Negative for dizziness and headaches.  Hematological: Does not bruise/bleed easily.  Psychiatric/Behavioral: The patient is not nervous/anxious.    Blood pressure (!) 138/100, pulse 65, temperature (!) 97.5 F (36.4 C), temperature source Oral, resp. rate (!) 35, SpO2 100 %. Physical Exam  Constitutional: She appears well-developed and well-nourished. No distress.  HENT:  Head: Normocephalic and atraumatic.  Eyes: Conjunctivae are normal. Right eye exhibits no discharge. Left eye exhibits no discharge. No scleral icterus.  Cardiovascular: Normal rate and regular rhythm.  Respiratory: Effort normal. No respiratory distress.  Musculoskeletal:     Cervical back: Normal range of motion.     Comments: LLE No traumatic wounds, ecchymosis, or rash  Hip severe TTP, knee mod TTP  No knee or ankle effusion  Knee stable to varus/ valgus and anterior/posterior stress  Sens DPN, SPN, TN intact  Motor EHL, ext, flex, evers 5/5  DP 2+, PT 1+, No significant edema  Neurological: She is alert.  Skin: Skin is warm and dry. She is not diaphoretic.  Psychiatric: She has a normal mood and affect. Her behavior is normal.    Assessment/Plan: Left hip fx -- Plan IMN tomorrow by Dr. Linna Caprice. Please keep NPO after MN.  Multiple medical problems including  CKD, HTN, HLD, GERD, and hx/o migraine -- per IM who will admit and manage. Appreciate their help.    Freeman Caldron, PA-C Orthopedic Surgery [phone] 01/26/2019, 1:09 PM

## 2019-01-26 NOTE — ED Provider Notes (Addendum)
[facility]-EMERGENCY DEPT Provider Note   CSN: [id number] Arrival date & time: 01/26/19  1478     History Chief Complaint  Patient presents with  . Fall    Jennifer Estrada is a 73 y.o. female.  HPI    Patient presents after mechanical fall.  Patient was preparing for elective eye surgery, when she had a fall. She notes that she was attempting to use the bathroom, fell, struck the left side of her face, and her left hip. Since that time she has been nonambulatory with severe sharp pain in the left hip, and less severe, though persistent pain in the left ear. No confusion, disorientation, vision changes, neck pain. Patient has history of multiple medical issues, and multiple prior surgery, though not in the left hip. Since onset, minimal improvement with fentanyl, and all symptoms are worse with motion or palpation. Past Medical History:  Diagnosis Date  . Allergic rhinitis   . Chronic kidney disease   . Depression   . DJD (degenerative joint disease) of knee   . GERD (gastroesophageal reflux disease)   . Hyperlipidemia   . Migraine    Takes Propranolol  . Neuromuscular disorder (HCC)    Body tremors at times,h/o falling cast right arm  . Osteoporosis   . Tremors of nervous system   . Vitamin D deficiency     Patient Active Problem List   Diagnosis Date Noted  . Closed left hip fracture, initial encounter (HCC) 01/26/2019  . Tremors of nervous system   . GERD (gastroesophageal reflux disease)   . Hyperlipidemia   . Depression   . Normocytic anemia   . Pain in both knees 05/20/2017  . Patella fracture 02/12/2014  . Migraine syndrome 05/02/2012  . Anxiety attack 05/02/2012  . TIA (transient ischemic attack) 05/02/2012    Past Surgical History:  Procedure Laterality Date  . KNEE SURGERY    . Left Foot Surgery  20 years ago   20 years ago  . ORIF PATELLA Left 02/12/2014   Procedure: OPEN REDUCTION INTERNAL (ORIF) FIXATION LEFT PATELLA;   Surgeon: [name], MD;  Location: [facility] ORS;  Service: Orthopedics;  Laterality: Left;  . PARS PLANA VITRECTOMY Left 02/21/2017   Procedure: PARS PLANA VITRECTOMY 25 GAUGE FOR ENDOPHTHALMITIS;  Surgeon: [name], MD;  Location: [facility] OR;  Service: Ophthalmology;  Laterality: Left;  . WRIST FRACTURE SURGERY       OB History   No obstetric history on file.     Family History  Problem Relation Age of Onset  . Osteoporosis Father   . GER disease Father   . Breast cancer Neg Hx     Social History   Tobacco Use  . Smoking status: Never Smoker  . Smokeless tobacco: Never Used  Substance Use Topics  . Alcohol use: No  . Drug use: No    Home Medications Prior to Admission medications   Medication Sig Start Date End Date Taking? Authorizing Provider  dexlansoprazole (DEXILANT) 60 MG capsule Take 60 mg by mouth daily.   Yes [provider]  docusate sodium 100 MG CAPS Take 100 mg by mouth 2 (two) times daily. Patient taking differently: Take 100 mg by mouth daily as needed (Mild Constipation).  02/14/14  Yes Babish, Molli Hazard, PA-C  fexofenadine (ALLEGRA) 30 MG tablet Take 30 mg by mouth as needed (allergies).    Yes [provider]  ketorolac (ACULAR) 0.5 % ophthalmic solution Place 1 drop into the right eye  4 (four) times daily. 01/11/19  Yes [provider]  MYRBETRIQ 50 MG TB24 tablet Take 50 mg by mouth at bedtime. 12/01/18  Yes [provider]  ofloxacin (OCUFLOX) 0.3 % ophthalmic solution Place 1 drop into the right eye 4 (four) times daily. 01/11/19  Yes [provider]  PARoxetine (PAXIL) 20 MG tablet Take 20 mg by mouth every morning.   Yes [provider]  POLYTRIM ophthalmic solution Place 1 drop into the right eye 4 (four) times daily. 01/11/19  Yes [provider]  prednisoLONE acetate (PRED FORTE) 1 % ophthalmic suspension INSTILL 1 DROP INTO LEFT EYE TWICE DAILY Patient taking differently: Place 1 drop into  the left eye 2 (two) times daily.  11/29/18  Yes Rennis ChrisZamora, Brian, MD  propranolol ER (INDERAL LA) 60 MG 24 hr capsule Take 60 mg by mouth daily. 01/23/19  Yes [provider]  rosuvastatin (CRESTOR) 5 MG tablet Take 5 mg by mouth at bedtime.    Yes [provider]  Vitamin D, Ergocalciferol, (DRISDOL) 50000 units CAPS capsule Take 50,000 Units by mouth every 7 (seven) days.  05/10/17  Yes [provider]  bacitracin-polymyxin b (POLYSPORIN) ophthalmic ointment Place into the left eye at bedtime. Place a 1/2 inch ribbon of ointment into the lower eyelid. Patient not taking: Reported on 01/26/2019 04/25/17   Rennis ChrisZamora, Brian, MD    Allergies    Atorvastatin, Cortisone, Oxycodone, Paroxetine, Penicillins, Pravastatin, Singulair [montelukast], Sulfa antibiotics, Aleve [naproxen sodium], Augmentin [amoxicillin-pot clavulanate], and Prednisone  Review of Systems   Review of Systems  Constitutional:       Per HPI, otherwise negative  HENT:       Per HPI, otherwise negative  Respiratory:       Per HPI, otherwise negative  Cardiovascular:       Per HPI, otherwise negative  Gastrointestinal: Negative for vomiting.  Endocrine:       Negative aside from HPI  Genitourinary:       Neg aside from HPI   Musculoskeletal:       Per HPI, otherwise negative  Skin: Positive for wound.  Neurological: Negative for syncope and weakness.    Physical Exam Updated Vital Signs BP (!) 138/100   Pulse 65   Temp (!) 97.5 F (36.4 C) (Oral)   Resp (!) 35   SpO2 100%   Physical Exam Vitals and nursing note reviewed.  Constitutional:      General: She is not in acute distress.    Appearance: She is well-developed.  HENT:     Head: Normocephalic.     Ears:   Eyes:     Conjunctiva/sclera: Conjunctivae normal.  Cardiovascular:     Rate and Rhythm: Normal rate and regular rhythm.  Pulmonary:     Effort: Pulmonary effort is normal. No respiratory distress.     Breath sounds:  Normal breath sounds. No stridor.  Abdominal:     General: There is no distension.  Musculoskeletal:       Legs:  Skin:    General: Skin is warm and dry.  Neurological:     Mental Status: She is alert and oriented to person, place, and time.     Cranial Nerves: No cranial nerve deficit.     ED Results / Procedures / Treatments   Labs (all labs ordered are listed, but only abnormal results are displayed) Labs Reviewed  BASIC METABOLIC PANEL - Abnormal; Notable for the following components:      Result Value  Creatinine, Ser 1.01 (*)    GFR calc non Af Amer 55 (*)    All other components within normal limits  CBC WITH DIFFERENTIAL/PLATELET - Abnormal; Notable for the following components:   RBC 3.73 (*)    Hemoglobin 11.3 (*)    All other components within normal limits  RESPIRATORY PANEL BY RT PCR (FLU A&B, COVID)  PROTIME-INR  TYPE AND SCREEN    EKG EKG Interpretation  Date/Time:  Tuesday January 26 2019 13:52:08 EST Ventricular Rate:  67 PR Interval:    QRS Duration: 121 QT Interval:  411 QTC Calculation: 434 R Axis:   85 Text Interpretation: Sinus rhythm Nonspecific intraventricular conduction delay ST-t wave abnormality Artifact Abnormal ECG Confirmed by Gerhard Munch [phone]) on 01/26/2019 2:13:49 PM   Radiology DG Chest 1 View  Result Date: 01/26/2019 CLINICAL DATA:  Transported by [facility] where pt was about to have cataract surgery-- pt has a unwitnessed fall injuring LLE. +shortening, obvious deformity, swelling, pain. No prior injury to L hip. EXAM: CHEST  1 VIEW COMPARISON:  05/02/2012 FINDINGS: Cardiac silhouette is normal in size. No mediastinal or hilar masses. No evidence of adenopathy. Mild scarring noted at the lung apices. Lungs otherwise clear. No pleural effusion or pneumothorax. Skeletal structures are grossly intact. IMPRESSION: No acute cardiopulmonary disease. Electronically Signed   By: Amie Portland M.D.    On: 01/26/2019 10:11   CT Head Wo Contrast  Result Date: 01/26/2019 CLINICAL DATA:  Unwitnessed fall. EXAM: CT HEAD WITHOUT CONTRAST TECHNIQUE: Contiguous axial images were obtained from the base of the skull through the vertex without intravenous contrast. COMPARISON:  February 07, 2014. FINDINGS: Brain: Mild chronic ischemic white matter disease is noted. No mass effect or midline shift is noted. Ventricular size is within normal limits. There is no evidence of mass lesion, hemorrhage or acute infarction. Vascular: No hyperdense vessel or unexpected calcification. Skull: Normal. Negative for fracture or focal lesion. Sinuses/Orbits: No acute finding. Other: None. IMPRESSION: Mild chronic ischemic white matter disease. No acute intracranial abnormality seen. Electronically Signed   By: Lupita Raider M.D.   On: 01/26/2019 10:21   DG Hip Unilat With Pelvis 2-3 Views Left  Result Date: 01/26/2019 CLINICAL DATA:  Transported by [facility] from [facility] where pt was about to have cataract surgery-- pt has a unwitnessed fall injuring LLE. +shortening, obvious deformity, swelling, pain. No prior injury to L hip. EXAM: DG HIP (WITH OR WITHOUT PELVIS) 2-3V LEFT COMPARISON:  None. FINDINGS: Intertrochanteric fracture of the proximal left femur. A fracture line crosses base of the femoral neck with another extending superior to the lesser trochanter to the lateral proximal femoral metaphysis. Proximal fracture component is displaced laterally by 1.7 cm, with mild varus angulation. No other fractures.  Hip joints normally spaced and aligned. IMPRESSION: 1. Mildly displaced and varus angulated intertrochanteric fracture of the proximal left femur. No dislocation. Electronically Signed   By: Amie Portland M.D.   On: 01/26/2019 10:13    Procedures .Marland KitchenLaceration Repair  Date/Time: 01/26/2019 1:13 PM Performed by: Gerhard Munch, MD Authorized by: Gerhard Munch, MD   Consent:    Consent  obtained:  Verbal   Consent given by:  Patient   Risks discussed:  Infection, pain, tendon damage, vascular damage, poor wound healing, poor cosmetic result, need for additional repair and nerve damage   Alternatives discussed:  No treatment Universal protocol:    Procedure explained and questions answered to patient or proxy's  satisfaction: yes     Relevant documents present and verified: yes     Test results available and properly labeled: yes     Imaging studies available: yes     Required blood products, implants, devices, and special equipment available: yes     Site/side marked: yes     Immediately prior to procedure, a time out was called: yes     Patient identity confirmed:  Verbally with patient Anesthesia (see MAR for exact dosages):    Anesthesia method:  Topical application   Topical anesthetic:  LET Laceration details:    Location:  Ear   Ear location:  L ear   Length (cm):  6 (2x3cm)   Depth (mm):  3 Repair type:    Repair type:  Simple Pre-procedure details:    Preparation:  Patient was prepped and draped in usual sterile fashion Exploration:    Hemostasis achieved with:  Direct pressure and epinephrine   Wound exploration: wound explored through full range of motion and entire depth of wound probed and visualized     Wound extent: vascular damage     Wound extent: no foreign bodies/material noted, no tendon damage noted and no underlying fracture noted     Contaminated: no   Treatment:    Area cleansed with:  Saline   Amount of cleaning:  Standard   Irrigation solution:  Sterile saline   Irrigation volume:  10   Irrigation method:  Syringe   Visualized foreign bodies/material removed: no   Skin repair:    Repair method:  Tissue adhesive Approximation:    Approximation:  Close Post-procedure details:    Patient tolerance of procedure:  Tolerated well, no immediate complications   (including critical care time)  Medications Ordered in ED Medications    fentaNYL (SUBLIMAZE) injection 50 mcg (has no administration in time range)  methocarbamol (ROBAXIN) tablet 500 mg (has no administration in time range)    Or  methocarbamol (ROBAXIN) 500 mg in dextrose 5 % 50 mL IVPB (has no administration in time range)  senna (SENOKOT) tablet 8.6 mg (has no administration in time range)  magnesium hydroxide (MILK OF MAGNESIA) suspension 30 mL (has no administration in time range)  lidocaine-EPINEPHrine-tetracaine (LET) topical gel (3 mLs Topical Given 01/26/19 1105)    ED Course  I have reviewed the triage vital signs and the nursing notes.  Pertinent labs & imaging results that were available during my care of the patient were reviewed by me and considered in my medical decision making (see chart for details).    MDM Rules/Calculators/A&P                      1:14 PM Patient is awake, alert, in no distress, has tolerated wound repair without complication.  She has no other complaints. Patient's case has been discussed with our orthopedic colleagues, and the patient has been accepted to our internal medicine colleagues for admission with planned repair of hip fracture tomorrow. Patient's other studies are reassuring, with unremarkable labs, CT head, coronavirus test. She did require suture repair, this was completed before admission. Final Clinical Impression(s) / ED Diagnoses Final diagnoses:  Fall, initial encounter  Closed fracture of left hip, initial encounter ([employer])  Laceration of left earlobe, initial encounter      Carmin Muskrat, MD 01/26/19 1315    Carmin Muskrat, MD 01/26/19 1414

## 2019-01-26 NOTE — H&P (Signed)
History and Physical    Jennifer Estrada YNW:[id number] DOB: September 20, 1945 DOA: 01/26/2019  PCP: Jennifer Laughter, MD   Patient coming from: [facility] [place].  I have personally briefly reviewed patient's old medical records in [facility]  Chief Complaint: Fall.  HPI: Jennifer Estrada is a 73 y.o. female with medical history significant of allergic rhinitis, unspecified CKD, depression, arthritis of the knee, GERD, hyperlipidemia, migraine headaches, basal tremors, osteoporosis, vitamin D deficiency who is coming to the emergency department from the [facility] [place], where she was waiting to have elective eye surgery, after having an unwitnessed fall while she was trying to use the bathroom falling on her left side injuring her LLE and left ear.  The patient states that she had to go to the bathroom and felt that her stomach did not feel well after she had the Inderal on an empty stomach earlier in the morning.  She does not remember what happened before she fell and does not think that she passed out for a long period of time.  She denies chest pain, palpitations, dizziness, diaphoresis, PND, orthopnea or recent pitting edema of the lower extremities.  She denies fever, chills, sore throat, rhinorrhea, wheezing or productive cough.  She denies abdominal pain, emesis, diarrhea, constipation, melena or hematochezia.  She has a history of urge incontinence and is using Myrbetriq.  She denies dysuria, frequency or hematuria.  No polyuria, polydipsia, polyphagia or blurred vision.  ED Course: Initial vital signs were temperature 97.5 F, pulse 66, respirations 16, blood pressure 134/91 mmHg and O2 sat 100% on room air.  Patient was given 25 mcg of fentanyl in the ED.  Her SARS testing was negative.  CBC showed a white count of 7.1, hemoglobin 11.3 g/dL and platelets 657.  Her hemoglobin is slightly decreased from baseline.  PT/INR within normal values.  BMP showed a creatinine of 1.01  mg/dL, but was otherwise unremarkable.  Imaging: Chest radiograph did not have any acute on normalities.  CT head shows chronic white matter disease, but no acute findings.  Her left hip x-ray shows a mildly displaced and varus angulated intertrochanteric fracture of the proximal left femur.  There was no dislocation.  Review of Systems: As per HPI otherwise 10 point review of systems negative.   Past Medical History:  Diagnosis Date  . Allergic rhinitis   . Chronic kidney disease   . Depression   . DJD (degenerative joint disease) of knee   . GERD (gastroesophageal reflux disease)   . Hyperlipidemia   . Migraine    Takes Propranolol  . Neuromuscular disorder (HCC)    Body tremors at times,h/o falling cast right arm  . Osteoporosis   . Tremors of nervous system   . Vitamin D deficiency     Past Surgical History:  Procedure Laterality Date  . KNEE SURGERY    . Left Foot Surgery  20 years ago   20 years ago  . ORIF PATELLA Left 02/12/2014   Procedure: OPEN REDUCTION INTERNAL (ORIF) FIXATION LEFT PATELLA;  Surgeon: [name], MD;  Location: [facility] ORS;  Service: Orthopedics;  Laterality: Left;  . PARS PLANA VITRECTOMY Left 02/21/2017   Procedure: PARS PLANA VITRECTOMY 25 GAUGE FOR ENDOPHTHALMITIS;  Surgeon: [name], MD;  Location: [facility] OR;  Service: Ophthalmology;  Laterality: Left;  . WRIST FRACTURE SURGERY       reports that she has never smoked. She has never used smokeless tobacco. She reports that she  does not drink alcohol or use drugs.  Allergies  Allergen Reactions  . Atorvastatin Other (See Comments)    Feels anxous  . Cortisone Other (See Comments)    headache  . Oxycodone Other (See Comments)    confusion  . Paroxetine Other (See Comments)    anxiety  . Penicillins Other (See Comments)    Unknown per pt  . Pravastatin Other (See Comments)    muscle pain   . Singulair [Montelukast] Other (See Comments)    Chest pain  . Sulfa Antibiotics Nausea Only  .  Aleve [Naproxen Sodium] Rash  . Augmentin [Amoxicillin-Pot Clavulanate] Rash  . Prednisone Other (See Comments)    headaches    Family History  Problem Relation Age of Onset  . Osteoporosis Father   . GER disease Father   . Breast cancer Neg Hx    Prior to Admission medications   Medication Sig Start Date End Date Taking? Authorizing Provider  dexlansoprazole (DEXILANT) 60 MG capsule Take 60 mg by mouth daily.   Yes [provider]  docusate sodium 100 MG CAPS Take 100 mg by mouth 2 (two) times daily. Patient taking differently: Take 100 mg by mouth daily as needed (Mild Constipation).  02/14/14  Yes Babish, Rodman Key, PA-C  fexofenadine (ALLEGRA) 30 MG tablet Take 30 mg by mouth as needed (allergies).    Yes [provider]  ketorolac (ACULAR) 0.5 % ophthalmic solution Place 1 drop into the right eye 4 (four) times daily. 01/11/19  Yes [provider]  MYRBETRIQ 50 MG TB24 tablet Take 50 mg by mouth at bedtime. 12/01/18  Yes [provider]  ofloxacin (OCUFLOX) 0.3 % ophthalmic solution Place 1 drop into the right eye 4 (four) times daily. 01/11/19  Yes [provider]  PARoxetine (PAXIL) 20 MG tablet Take 20 mg by mouth every morning.   Yes [provider]  POLYTRIM ophthalmic solution Place 1 drop into the right eye 4 (four) times daily. 01/11/19  Yes [provider]  prednisoLONE acetate (PRED FORTE) 1 % ophthalmic suspension INSTILL 1 DROP INTO LEFT EYE TWICE DAILY Patient taking differently: Place 1 drop into the left eye 2 (two) times daily.  11/29/18  Yes [name], MD  propranolol ER (INDERAL LA) 60 MG 24 hr capsule Take 60 mg by mouth daily. 01/23/19  Yes [provider]  rosuvastatin (CRESTOR) 5 MG tablet Take 5 mg by mouth at bedtime.    Yes [provider]  Vitamin D, Ergocalciferol, (DRISDOL) 50000 units CAPS capsule Take 50,000 Units by mouth every 7 (seven) days.  05/10/17  Yes [provider]  bacitracin-polymyxin b (POLYSPORIN) ophthalmic ointment Place into the left eye at bedtime. Place a 1/2 inch ribbon of ointment into the lower eyelid. Patient not taking: Reported on 01/26/2019 04/25/17   [name], MD    Physical Exam: Vitals:   01/26/19 1100 01/26/19 1115 01/26/19 1130 01/26/19 1200  BP: (!) 141/88 139/76 134/81 (!) 138/100  Pulse: 67 65 66 65  Resp: 15 16 19  (!) 35  Temp:      TempSrc:      SpO2: 100% 100% 100% 100%    Constitutional: NAD, calm, comfortable Eyes: PERRL, lids and conjunctivae normal ENMT: Mucous membranes are moist. Posterior pharynx clear of any exudate or lesions. Neck: normal, supple, no masses, no thyromegaly Respiratory: clear to auscultation bilaterally, no wheezing, no crackles. Normal respiratory effort. No accessory muscle use.  Cardiovascular: Regular rate and rhythm, no murmurs /  rubs / gallops. No extremity edema. 2+ pedal pulses. No carotid bruits.  Abdomen: Nondistended.  Soft, no tenderness, no masses palpated. No hepatosplenomegaly. Bowel sounds positive.  Musculoskeletal: no clubbing / cyanosis.  Shortened and laterally rotated LLE with loss of ROM at the hip joint, no contractures. Normal muscle tone.  Skin: There are some aberrations on her left ear area. Neurologic: CN 2-12 grossly intact. Sensation intact, DTR normal.  Overall good strength, except for LLE.  Psychiatric: Normal judgment and insight. Alert and oriented x 3. Normal mood.   Labs on Admission: I have personally reviewed following labs and imaging studies  CBC: Recent Labs  Lab 01/26/19 0931  WBC 7.1  NEUTROABS 4.4  HGB 11.3*  HCT 36.4  MCV 97.6  PLT 185   Basic Metabolic Panel: Recent Labs  Lab 01/26/19 0931  NA 139  K 4.5  CL 105  CO2 23  GLUCOSE 92  BUN 14  CREATININE 1.01*  CALCIUM 9.5   GFR: CrCl cannot be calculated (Unknown ideal weight.). Liver Function Tests: No results for input(s): AST, ALT, ALKPHOS, BILITOT,  PROT, ALBUMIN in the last 168 hours. No results for input(s): LIPASE, AMYLASE in the last 168 hours. No results for input(s): AMMONIA in the last 168 hours. Coagulation Profile: Recent Labs  Lab 01/26/19 0931  INR 1.0   Cardiac Enzymes: No results for input(s): CKTOTAL, CKMB, CKMBINDEX, TROPONINI in the last 168 hours. BNP (last 3 results) No results for input(s): PROBNP in the last 8760 hours. HbA1C: No results for input(s): HGBA1C in the last 72 hours. CBG: No results for input(s): GLUCAP in the last 168 hours. Lipid Profile: No results for input(s): CHOL, HDL, LDLCALC, TRIG, CHOLHDL, LDLDIRECT in the last 72 hours. Thyroid Function Tests: No results for input(s): TSH, T4TOTAL, FREET4, T3FREE, THYROIDAB in the last 72 hours. Anemia Panel: No results for input(s): VITAMINB12, FOLATE, FERRITIN, TIBC, IRON, RETICCTPCT in the last 72 hours. Urine analysis:    Component Value Date/Time   COLORURINE YELLOW 05/02/2012 0221   APPEARANCEUR CLOUDY (A) 05/02/2012 0221   LABSPEC 1.015 05/02/2012 0221   PHURINE 8.5 (H) 05/02/2012 0221   GLUCOSEU NEGATIVE 05/02/2012 0221   HGBUR NEGATIVE 05/02/2012 0221   BILIRUBINUR NEGATIVE 05/02/2012 0221   KETONESUR 40 (A) 05/02/2012 0221   PROTEINUR NEGATIVE 05/02/2012 0221   UROBILINOGEN 0.2 05/02/2012 0221   NITRITE NEGATIVE 05/02/2012 0221   LEUKOCYTESUR NEGATIVE 05/02/2012 0221    Radiological Exams on Admission: DG Chest 1 View  Result Date: 01/26/2019 CLINICAL DATA:  [facility] surgery-- pt has a unwitnessed fall injuring LLE. +shortening, obvious deformity, swelling, pain. No prior injury to L hip. EXAM: CHEST  1 VIEW COMPARISON:  05/02/2012 FINDINGS: Cardiac silhouette is normal in size. No mediastinal or hilar masses. No evidence of adenopathy. Mild scarring noted at the lung apices. Lungs otherwise clear. No pleural effusion or pneumothorax. Skeletal  structures are grossly intact. IMPRESSION: No acute cardiopulmonary disease. Electronically Signed   By: Amie Portlandavid  Ormond M.D.   On: 01/26/2019 10:11   CT Head Wo Contrast  Result Date: 01/26/2019 CLINICAL DATA:  Unwitnessed fall. EXAM: CT HEAD WITHOUT CONTRAST TECHNIQUE: Contiguous axial images were obtained from the base of the skull through the vertex without intravenous contrast. COMPARISON:  February 07, 2014. FINDINGS: Brain: Mild chronic ischemic white matter disease is noted. No mass effect or midline shift is noted. Ventricular size is within normal limits. There is  no evidence of mass lesion, hemorrhage or acute infarction. Vascular: No hyperdense vessel or unexpected calcification. Skull: Normal. Negative for fracture or focal lesion. Sinuses/Orbits: No acute finding. Other: None. IMPRESSION: Mild chronic ischemic white matter disease. No acute intracranial abnormality seen. Electronically Signed   By: Lupita Raider M.D.   On: 01/26/2019 10:21   DG Hip Unilat With Pelvis 2-3 Views Left  Result Date: 01/26/2019 CLINICAL DATA:  Trans[facility] where pt was about to have cataract surgery-- pt has a unwitnessed fall injuring LLE. +shortening, obvious deformity, swelling, pain. No prior injury to L hip. EXAM: DG HIP (WITH OR WITHOUT PELVIS) 2-3V LEFT COMPARISON:  None. FINDINGS: Intertrochanteric fracture of the proximal left femur. A fracture line crosses base of the femoral neck with another extending superior to the lesser trochanter to the lateral proximal femoral metaphysis. Proximal fracture component is displaced laterally by 1.7 cm, with mild varus angulation. No other fractures.  Hip joints normally spaced and aligned. IMPRESSION: 1. Mildly displaced and varus angulated intertrochanteric fracture of the proximal left femur. No dislocation. Electronically Signed   By: Amie Portland M.D.   On: 01/26/2019 10:13    EKG: Independently reviewed.  Vent.  rate 67 BPM PR interval * ms QRS duration 121 ms QT/QTc 411/434 ms P-R-T axes * 85 81 Sinus rhythm Nonspecific intraventricular conduction delay ST-t wave abnormality Artifact from basal tremor  Assessment/Plan Principal Problem:   Closed left hip fracture, initial encounter (HCC) Admit to telemetry/inpatient. N.p.o. after midnight. Analgesics as needed. Muscle relaxants as needed. Antiemetics as needed. PT/OT evaluation. Dietitian evaluation of nutritional needs. Will likely have surgery tomorrow.  Active Problems:   Syncope Vasovagal vs/plus Inderal on empty stomach.    Tremors of nervous system Continue Inderal 60 mg p.o. daily.    GERD (gastroesophageal reflux disease) Continue PPI.    Hyperlipidemia On Crestor 5 mg p.o. bedtime.    Depression Continue paroxetine 20 mg p.o. daily.    Normocytic anemia Monitor H&H.    DVT prophylaxis: SCDs. Code Status: Full code. Family Communication:  Disposition Plan: Admit for pain control, orthopedic surgery evaluation and procedure Consults called: Orthopedic surgery (Dr. Linna Caprice). Admission status: Inpatient/telemetry.   Bobette Mo MD [facility]  If 7PM-7AM, please contact night-coverage [email or web address]  01/26/2019, 1:04 PM   This document was prepared using Dragon voice recognition software and may contain some unintended transcription errors.

## 2019-01-26 NOTE — ED Notes (Signed)
ED Provider at bedside. 

## 2019-01-26 NOTE — ED Notes (Signed)
ECHO at bedside.

## 2019-01-27 ENCOUNTER — Inpatient Hospital Stay (HOSPITAL_COMMUNITY): Payer: Medicare Other | Admitting: Anesthesiology

## 2019-01-27 ENCOUNTER — Encounter (HOSPITAL_COMMUNITY): Payer: Self-pay | Admitting: Internal Medicine

## 2019-01-27 ENCOUNTER — Inpatient Hospital Stay (HOSPITAL_COMMUNITY): Payer: Medicare Other

## 2019-01-27 ENCOUNTER — Encounter (HOSPITAL_COMMUNITY)
Admission: EM | Disposition: A | Discharge status: Skilled Nursing Facility | Payer: Self-pay | Source: Ambulatory Visit | Attending: Internal Medicine

## 2019-01-27 HISTORY — PX: INTRAMEDULLARY (IM) NAIL INTERTROCHANTERIC: SHX5875

## 2019-01-27 LAB — SURGICAL PCR SCREEN
MRSA, PCR: NEGATIVE
Staphylococcus aureus: NEGATIVE

## 2019-01-27 LAB — COMPREHENSIVE METABOLIC PANEL
ALT: 19 U/L (ref 0–44)
AST: 19 U/L (ref 15–41)
Albumin: 3.1 g/dL — ABNORMAL LOW (ref 3.5–5.0)
Alkaline Phosphatase: 47 U/L (ref 38–126)
Anion gap: 7 (ref 5–15)
BUN: 14 mg/dL (ref 8–23)
CO2: 26 mmol/L (ref 22–32)
Calcium: 9 mg/dL (ref 8.9–10.3)
Chloride: 106 mmol/L (ref 98–111)
Creatinine, Ser: 0.99 mg/dL (ref 0.44–1.00)
GFR calc Af Amer: >60 mL/min (ref >60)
GFR calc non Af Amer: 56 mL/min — ABNORMAL LOW (ref >60)
Glucose, Bld: 107 mg/dL — ABNORMAL HIGH (ref 70–99)
Potassium: 4.3 mmol/L (ref 3.5–5.1)
Sodium: 139 mmol/L (ref 135–145)
Total Bilirubin: 1.4 mg/dL — ABNORMAL HIGH (ref 0.3–1.2)
Total Protein: 5.8 g/dL — ABNORMAL LOW (ref 6.5–8.1)

## 2019-01-27 LAB — CBC
HCT: 34.8 % — ABNORMAL LOW (ref 36.0–46.0)
HCT: 35.6 % — ABNORMAL LOW (ref 36.0–46.0)
Hemoglobin: 11 g/dL — ABNORMAL LOW (ref 12.0–15.0)
Hemoglobin: 11.1 g/dL — ABNORMAL LOW (ref 12.0–15.0)
MCH: 31 pg (ref 26.0–34.0)
MCH: 31.1 pg (ref 26.0–34.0)
MCHC: 31.6 g/dL (ref 30.0–36.0)
MCHC: 31.2 g/dL (ref 30.0–36.0)
MCV: 98 fL (ref 80.0–100.0)
MCV: 99.7 fL (ref 80.0–100.0)
Platelets: 194 10*3/uL (ref 150–400)
Platelets: 198 10*3/uL (ref 150–400)
RBC: 3.55 MIL/uL — ABNORMAL LOW (ref 3.87–5.11)
RBC: 3.57 MIL/uL — ABNORMAL LOW (ref 3.87–5.11)
RDW: 13.7 % (ref 11.5–15.5)
RDW: 13.6 % (ref 11.5–15.5)
WBC: 8.4 10*3/uL (ref 4.0–10.5)
WBC: 14.8 10*3/uL — ABNORMAL HIGH (ref 4.0–10.5)
nRBC: 0 % (ref 0.0–0.2)
nRBC: 0 % (ref 0.0–0.2)

## 2019-01-27 LAB — CREATININE, SERUM
Creatinine, Ser: 0.91 mg/dL (ref 0.44–1.00)
GFR calc Af Amer: >60 mL/min (ref >60)
GFR calc non Af Amer: >60 mL/min (ref >60)

## 2019-01-27 SURGERY — FIXATION, FRACTURE, INTERTROCHANTERIC, WITH INTRAMEDULLARY ROD
Anesthesia: Spinal | Laterality: Left

## 2019-01-27 MED ORDER — VITAMIN D (ERGOCALCIFEROL) 1.25 MG (50000 UNIT) PO CAPS
50000.0000 [IU] | ORAL_CAPSULE | ORAL | Status: DC
  Administered 2019-01-29: 10:00:00 50000 [IU] via ORAL
  Filled 2019-01-27: qty 1

## 2019-01-27 MED ORDER — LORATADINE 10 MG PO TABS
10.0000 mg | ORAL_TABLET | Freq: Every day | ORAL | Status: DC
  Administered 2019-01-27 – 2019-02-02 (×7): 10 mg via ORAL
  Filled 2019-01-27 (×7): qty 1

## 2019-01-27 MED ORDER — OFLOXACIN 0.3 % OP SOLN
1.0000 [drp] | Freq: Four times a day (QID) | OPHTHALMIC | Status: DC
  Administered 2019-01-27 – 2019-02-02 (×23): 1 [drp] via OPHTHALMIC
  Filled 2019-01-27: qty 5

## 2019-01-27 MED ORDER — VITAMIN D (ERGOCALCIFEROL) 1.25 MG (50000 UNIT) PO CAPS: 50000.0000 [IU] | ORAL_CAPSULE | ORAL | Status: DC

## 2019-01-27 MED ORDER — CLINDAMYCIN PHOSPHATE 600 MG/50ML IV SOLN
600.0000 mg | Freq: Four times a day (QID) | INTRAVENOUS | Status: AC
  Administered 2019-01-27 – 2019-01-28 (×2): 600 mg via INTRAVENOUS
  Filled 2019-01-27 (×2): qty 50

## 2019-01-27 MED ORDER — ISOPROPYL ALCOHOL 70 % SOLN
Status: DC | PRN
  Administered 2019-01-27: 1 via TOPICAL

## 2019-01-27 MED ORDER — POLYMYXIN B-TRIMETHOPRIM 10000-0.1 UNIT/ML-% OP SOLN: 1.0000 [drp] | Freq: Four times a day (QID) | OPHTHALMIC | Status: DC

## 2019-01-27 MED ORDER — FENTANYL CITRATE (PF) 100 MCG/2ML IJ SOLN
INTRAMUSCULAR | Status: AC
  Filled 2019-01-27: qty 2

## 2019-01-27 MED ORDER — PREDNISOLONE ACETATE 1 % OP SUSP
1.0000 [drp] | Freq: Two times a day (BID) | OPHTHALMIC | Status: DC
  Administered 2019-01-27 – 2019-02-02 (×12): 1 [drp] via OPHTHALMIC
  Filled 2019-01-27: qty 5

## 2019-01-27 MED ORDER — DOCUSATE SODIUM 100 MG PO CAPS
100.0000 mg | ORAL_CAPSULE | Freq: Two times a day (BID) | ORAL | Status: DC
  Administered 2019-01-27 – 2019-02-02 (×12): 100 mg via ORAL
  Filled 2019-01-27 (×12): qty 1

## 2019-01-27 MED ORDER — ENOXAPARIN SODIUM 40 MG/0.4ML ~~LOC~~ SOLN
40.0000 mg | SUBCUTANEOUS | Status: DC
  Administered 2019-01-28 – 2019-02-02 (×6): 40 mg via SUBCUTANEOUS
  Filled 2019-01-27 (×7): qty 0.4

## 2019-01-27 MED ORDER — BUPIVACAINE HCL (PF) 0.25 % IJ SOLN
INTRAMUSCULAR | Status: AC
  Filled 2019-01-27: qty 30

## 2019-01-27 MED ORDER — PAROXETINE HCL 20 MG PO TABS
20.0000 mg | ORAL_TABLET | Freq: Every morning | ORAL | Status: DC
  Administered 2019-01-28 – 2019-02-02 (×6): 20 mg via ORAL
  Filled 2019-01-27 (×6): qty 1

## 2019-01-27 MED ORDER — ACETAMINOPHEN 325 MG PO TABS: 325.0000 mg | ORAL_TABLET | Freq: Four times a day (QID) | ORAL | Status: DC | PRN

## 2019-01-27 MED ORDER — LACTATED RINGERS IV SOLN: INTRAVENOUS | Status: DC

## 2019-01-27 MED ORDER — KETOROLAC TROMETHAMINE 0.5 % OP SOLN: 1.0000 [drp] | Freq: Four times a day (QID) | OPHTHALMIC | Status: DC

## 2019-01-27 MED ORDER — METOCLOPRAMIDE HCL 5 MG PO TABS: 5.0000 mg | ORAL_TABLET | Freq: Three times a day (TID) | ORAL | Status: DC | PRN

## 2019-01-27 MED ORDER — METOCLOPRAMIDE HCL 5 MG/ML IJ SOLN: 5.0000 mg | Freq: Three times a day (TID) | INTRAMUSCULAR | Status: DC | PRN

## 2019-01-27 MED ORDER — DOCUSATE SODIUM 100 MG PO CAPS: 100.0000 mg | ORAL_CAPSULE | Freq: Every day | ORAL | Status: DC | PRN

## 2019-01-27 MED ORDER — PANTOPRAZOLE SODIUM 40 MG PO TBEC
40.0000 mg | DELAYED_RELEASE_TABLET | Freq: Every day | ORAL | Status: DC
  Administered 2019-01-27 – 2019-02-02 (×7): 40 mg via ORAL
  Filled 2019-01-27 (×7): qty 1

## 2019-01-27 MED ORDER — PHENYLEPHRINE HCL-NACL 10-0.9 MG/250ML-% IV SOLN
INTRAVENOUS | Status: DC | PRN
  Administered 2019-01-27: 65 ug/min via INTRAVENOUS

## 2019-01-27 MED ORDER — SODIUM CHLORIDE (PF) 0.9 % IJ SOLN
INTRAMUSCULAR | Status: AC
  Filled 2019-01-27: qty 50

## 2019-01-27 MED ORDER — MENTHOL 3 MG MT LOZG: 1.0000 | LOZENGE | OROMUCOSAL | Status: DC | PRN

## 2019-01-27 MED ORDER — HYDROCODONE-ACETAMINOPHEN 7.5-325 MG PO TABS
1.0000 | ORAL_TABLET | ORAL | Status: DC | PRN
  Administered 2019-01-28 – 2019-02-02 (×8): 1 via ORAL
  Filled 2019-01-27 (×8): qty 1

## 2019-01-27 MED ORDER — PROPRANOLOL HCL ER 60 MG PO CP24
60.0000 mg | ORAL_CAPSULE | Freq: Every day | ORAL | Status: DC
  Administered 2019-01-27 – 2019-02-02 (×3): 60 mg via ORAL
  Filled 2019-01-27 (×7): qty 1

## 2019-01-27 MED ORDER — MIRABEGRON ER 25 MG PO TB24
50.0000 mg | ORAL_TABLET | Freq: Every day | ORAL | Status: DC
  Administered 2019-01-27 – 2019-02-01 (×6): 50 mg via ORAL
  Filled 2019-01-27 (×7): qty 2

## 2019-01-27 MED ORDER — ROSUVASTATIN CALCIUM 5 MG PO TABS
5.0000 mg | ORAL_TABLET | Freq: Every day | ORAL | Status: DC
  Administered 2019-01-27 – 2019-02-01 (×6): 5 mg via ORAL
  Filled 2019-01-27 (×6): qty 1

## 2019-01-27 MED ORDER — BUPIVACAINE IN DEXTROSE 0.75-8.25 % IT SOLN
INTRATHECAL | Status: DC | PRN
  Administered 2019-01-27: 1.8 mL via INTRATHECAL

## 2019-01-27 MED ORDER — HYDROCODONE-ACETAMINOPHEN 5-325 MG PO TABS
1.0000 | ORAL_TABLET | ORAL | Status: DC | PRN
  Administered 2019-01-30 – 2019-01-31 (×2): 1 via ORAL
  Filled 2019-01-27 (×2): qty 1

## 2019-01-27 MED ORDER — CHLORHEXIDINE GLUCONATE CLOTH 2 % EX PADS: 6.0000 | MEDICATED_PAD | Freq: Every day | CUTANEOUS | Status: DC

## 2019-01-27 MED ORDER — LIDOCAINE 2% (20 MG/ML) 5 ML SYRINGE
INTRAMUSCULAR | Status: DC | PRN
  Administered 2019-01-27: 100 mg via INTRAVENOUS

## 2019-01-27 MED ORDER — SODIUM CHLORIDE 0.9 % IR SOLN
Status: DC | PRN
  Administered 2019-01-27: 1000 mL

## 2019-01-27 MED ORDER — KETOROLAC TROMETHAMINE 30 MG/ML IJ SOLN
INTRAMUSCULAR | Status: AC
  Filled 2019-01-27: qty 1

## 2019-01-27 MED ORDER — ONDANSETRON HCL 4 MG PO TABS: 4.0000 mg | ORAL_TABLET | Freq: Four times a day (QID) | ORAL | Status: DC | PRN

## 2019-01-27 MED ORDER — PROPOFOL 10 MG/ML IV BOLUS
INTRAVENOUS | Status: DC | PRN
  Administered 2019-01-27: 65 ug/kg/min via INTRAVENOUS
  Administered 2019-01-27: 60 mg via INTRAVENOUS

## 2019-01-27 MED ORDER — ONDANSETRON HCL 4 MG/2ML IJ SOLN
4.0000 mg | Freq: Four times a day (QID) | INTRAMUSCULAR | Status: DC | PRN
  Administered 2019-01-28: 08:00:00 4 mg via INTRAVENOUS
  Filled 2019-01-27: qty 2

## 2019-01-27 MED ORDER — PHENOL 1.4 % MT LIQD
1.0000 | OROMUCOSAL | Status: DC | PRN
  Filled 2019-01-27: qty 177

## 2019-01-27 MED ORDER — ONDANSETRON HCL 4 MG/2ML IJ SOLN
INTRAMUSCULAR | Status: DC | PRN
  Administered 2019-01-27: 4 mg via INTRAVENOUS

## 2019-01-27 SURGICAL SUPPLY — 45 items
BAG ZIPLOCK 12X15 (MISCELLANEOUS) IMPLANT
BIT DRILL AO GAMMA 4.2X180 (BIT) ×2 IMPLANT
CHLORAPREP W/TINT 26 (MISCELLANEOUS) ×3 IMPLANT
COVER PERINEAL POST (MISCELLANEOUS) ×3 IMPLANT
COVER SURGICAL LIGHT HANDLE (MISCELLANEOUS) ×3 IMPLANT
COVER WAND RF STERILE (DRAPES) IMPLANT
DERMABOND ADVANCED (GAUZE/BANDAGES/DRESSINGS) ×4
DERMABOND ADVANCED .7 DNX12 (GAUZE/BANDAGES/DRESSINGS) ×2 IMPLANT
DRAPE C-ARM 42X120 X-RAY (DRAPES) ×3 IMPLANT
DRAPE C-ARMOR (DRAPES) ×3 IMPLANT
DRAPE IMP U-DRAPE 54X76 (DRAPES) ×6 IMPLANT
DRAPE SHEET LG 3/4 BI-LAMINATE (DRAPES) ×6 IMPLANT
DRAPE STERI IOBAN 125X83 (DRAPES) ×3 IMPLANT
DRAPE U-SHAPE 47X51 STRL (DRAPES) ×6 IMPLANT
DRSG MEPILEX BORDER 4X4 (GAUZE/BANDAGES/DRESSINGS) ×8 IMPLANT
DRSG MEPILEX BORDER 4X8 (GAUZE/BANDAGES/DRESSINGS) ×2 IMPLANT
ELECT BLADE TIP CTD 4 INCH (ELECTRODE) ×2 IMPLANT
FACESHIELD WRAPAROUND (MASK) ×9 IMPLANT
FACESHIELD WRAPAROUND OR TEAM (MASK) ×2 IMPLANT
GAUZE SPONGE 4X4 12PLY STRL (GAUZE/BANDAGES/DRESSINGS) ×3 IMPLANT
GLOVE BIO SURGEON STRL SZ8.5 (GLOVE) ×6 IMPLANT
GLOVE BIOGEL PI IND STRL 8.5 (GLOVE) ×1 IMPLANT
GLOVE BIOGEL PI INDICATOR 8.5 (GLOVE) ×2
GOWN SPEC L3 XXLG W/TWL (GOWN DISPOSABLE) ×3 IMPLANT
GUIDEROD T2 3X1000 (ROD) ×2 IMPLANT
K-WIRE  3.2X450M STR (WIRE) ×2
K-WIRE 3.2X450M STR (WIRE) ×1
KIT BASIN OR (CUSTOM PROCEDURE TRAY) ×3 IMPLANT
KIT TURNOVER KIT A (KITS) IMPLANT
KWIRE 3.2X450M STR (WIRE) IMPLANT
LONG NAIL KIT R1.5,Ti,LEFT       11X400X 125 DEGRE (Orthopedic Implant) ×2 IMPLANT
MANIFOLD NEPTUNE II (INSTRUMENTS) ×3 IMPLANT
MARKER SKIN DUAL TIP RULER LAB (MISCELLANEOUS) ×3 IMPLANT
NAIL LONG 011X400X125 (Nail) ×2 IMPLANT
PACK GENERAL/GYN (CUSTOM PROCEDURE TRAY) ×3 IMPLANT
PENCIL SMOKE EVACUATOR (MISCELLANEOUS) IMPLANT
REAMER SHAFT BIXCUT (INSTRUMENTS) ×2 IMPLANT
SCREW LAG GAMMA 3 TI 10.5X100M (Screw) ×2 IMPLANT
SCREW LOCKING THREADED 5X47.5 (Screw) ×2 IMPLANT
SUT MNCRL AB 3-0 PS2 18 (SUTURE) ×3 IMPLANT
SUT VIC AB 1 CT1 36 (SUTURE) ×3 IMPLANT
SUT VIC AB 2-0 CT1 27 (SUTURE) ×4
SUT VIC AB 2-0 CT1 TAPERPNT 27 (SUTURE) IMPLANT
TOWEL OR 17X26 10 PK STRL BLUE (TOWEL DISPOSABLE) ×3 IMPLANT
WATER STERILE IRR 1000ML POUR (IV SOLUTION) ×4 IMPLANT

## 2019-01-27 NOTE — Anesthesia Postprocedure Evaluation (Signed)
Anesthesia Post Note  Patient: Jennifer Estrada  Procedure(s) Performed: INTRAMEDULLARY (IM) NAIL INTERTROCHANTRIC (Left )     Patient location during evaluation: PACU Anesthesia Type: Spinal Level of consciousness: oriented and awake and alert Pain management: pain level controlled Vital Signs Assessment: post-procedure vital signs reviewed and stable Respiratory status: spontaneous breathing and respiratory function stable Cardiovascular status: blood pressure returned to baseline and stable Postop Assessment: no headache, no backache, no apparent nausea or vomiting and patient able to bend at knees Anesthetic complications: no    Last Vitals:  Vitals:   01/27/19 1530 01/27/19 1545  BP: 122/72 (!) 117/49  Pulse: 71 70  Resp: 18 16  Temp:  36.5 C  SpO2: 100% 100%    Last Pain:  Vitals:   01/27/19 1545  TempSrc:   PainSc: 0-No pain                 Pervis Hocking

## 2019-01-27 NOTE — Discharge Instructions (Signed)
 Dr. Teofilo Lupinacci Adult Hip [facility] Knee Specialist [facility] [address] [place], [place] [address] [phone]   POSTOPERATIVE DIRECTIONS    Hip Rehabilitation, Guidelines Following Surgery   WEIGHT BEARING Partial weight bearing with assist device as directed.  TDWB LLE   HOME CARE INSTRUCTIONS  Remove items at home which could result in a fall. This includes throw rugs or furniture in walking pathways.  Continue medications as instructed at time of discharge.  You may have some home medications which will be placed on hold until you complete the course of blood thinner medication.  4 days after discharge, you may start showering. No tub baths or soaking your incisions. Do not put on socks or shoes without following the instructions of your caregivers.   Sit on chairs with arms. Use the chair arms to help push yourself up when arising.  Arrange for the use of a toilet seat elevator so you are not sitting low.   Walk with walker as instructed.  You may resume a sexual relationship in one month or when given the OK by your caregiver.  Use walker as long as suggested by your caregivers.  Avoid periods of inactivity such as sitting longer than an hour when not asleep. This helps prevent blood clots.  You may return to work once you are cleared by your surgeon.  Do not drive a car for 6 weeks or until released by your surgeon.  Do not drive while taking narcotics.  Wear elastic stockings for two weeks following surgery during the day but you may remove then at night.  Make sure you keep all of your appointments after your operation with all of your doctors and caregivers. You should call the office at the above phone number and make an appointment for approximately two weeks after the date of your surgery. Please pick up a stool softener and laxative for home use as long as you are requiring pain medications.  ICE to the affected hip every three hours  for 30 minutes at a time and then as needed for pain and swelling. Continue to use ice on the hip for pain and swelling from surgery. You may notice swelling that will progress down to the foot and ankle.  This is normal after surgery.  Elevate the leg when you are not up walking on it.   It is important for you to complete the blood thinner medication as prescribed by your doctor.  Continue to use the breathing machine which will help keep your temperature down.  It is common for your temperature to cycle up and down following surgery, especially at night when you are not up moving around and exerting yourself.  The breathing machine keeps your lungs expanded and your temperature down.  RANGE OF MOTION AND STRENGTHENING EXERCISES  These exercises are designed to help you keep full movement of your hip joint. Follow your caregiver's or physical therapist's instructions. Perform all exercises about fifteen times, three times per day or as directed. Exercise both hips, even if you have had only one joint replacement. These exercises can be done on a training (exercise) mat, on the floor, on a table or on a bed. Use whatever works the best and is most comfortable for you. Use music or television while you are exercising so that the exercises are a pleasant break in your day. This will make your life better with the exercises acting as a break in routine you can look   forward to.  Lying on your back, slowly slide your foot toward your buttocks, raising your knee up off the floor. Then slowly slide your foot back down until your leg is straight again.  Lying on your back spread your legs as far apart as you can without causing discomfort.  Lying on your side, raise your upper leg and foot straight up from the floor as far as is comfortable. Slowly lower the leg and repeat.  Lying on your back, tighten up the muscle in the front of your thigh (quadriceps muscles). You can do this by keeping your leg straight and  trying to raise your heel off the floor. This helps strengthen the largest muscle supporting your knee.  Lying on your back, tighten up the muscles of your buttocks both with the legs straight and with the knee bent at a comfortable angle while keeping your heel on the floor.   SKILLED REHAB INSTRUCTIONS: If the patient is transferred to a skilled rehab facility following release from the hospital, a list of the current medications will be sent to the facility for the patient to continue.  When discharged from the skilled rehab facility, please have the facility set up the patient's Home Health Physical Therapy prior to being released. Also, the skilled facility will be responsible for providing the patient with their medications at time of release from the facility to include their pain medication and their blood thinner medication. If the patient is still at the rehab facility at time of the two week follow up appointment, the skilled rehab facility will also need to assist the patient in arranging follow up appointment in our office and any transportation needs.  MAKE SURE YOU:  Understand these instructions.  Will watch your condition.  Will get help right away if you are not doing well or get worse.  Pick up stool softner and laxative for home use following surgery while on pain medications. Daily dry dressing changes as needed. In 4 days, you may remove your dressings and begin taking showers - no tub baths or soaking the incisions. Continue to use ice for pain and swelling after surgery. Do not use any lotions or creams on the incision until instructed by your surgeon.   

## 2019-01-27 NOTE — Progress Notes (Signed)
Pt shows no signs of Neurovascular abnormalities as of 0200 on 01/27/2019.  Pt was unable to fill out informed consent. Order needs to be placed for procedure stating the exact name of medical procedure. Pt also requests clarification on healing time frame.  Unsigned informed consent paperwork is left bedside. AM nurse may need to witness once info has been provided.

## 2019-01-27 NOTE — Anesthesia Preprocedure Evaluation (Addendum)
Anesthesia Evaluation  Patient identified by MRN, date of birth, ID band Patient awake    Reviewed: Allergy & Precautions, H&P , NPO status , Patient's Chart, lab work & pertinent test results  Airway Mallampati: II  TM Distance: >3 FB Neck ROM: Full    Dental no notable dental hx. (+)    Pulmonary neg pulmonary ROS,    Pulmonary exam normal breath sounds clear to auscultation       Cardiovascular Exercise Tolerance: Good negative cardio ROS Normal cardiovascular exam Rhythm:regular Rate:Normal     Neuro/Psych  Headaches, PSYCHIATRIC DISORDERS Anxiety Depression Tremors, migraines- on propanolol- has not had since yesterday TIAnegative psych ROS   GI/Hepatic Neg liver ROS, GERD  Controlled,  Endo/Other  negative endocrine ROS  Renal/GU Renal diseaseCKD 3 per patient, Cr 0.99  negative genitourinary   Musculoskeletal  (+) Arthritis , Osteoarthritis,  Left hip fx s/p fall- hit head, but CT is clear DJD   Abdominal Normal abdominal exam  (+)   Peds  Hematology  (+) anemia , Hct 34.8, plt 194   Anesthesia Other Findings HLD  Uneven pupils s/p eye surgery- v. Poor vision left eye  Reproductive/Obstetrics negative OB ROS                         Anesthesia Physical  Anesthesia Plan  ASA: II  Anesthesia Plan: Spinal   Post-op Pain Management:    Induction:   PONV Risk Score and Plan: 3 and Treatment may vary due to age or medical condition, Propofol infusion and TIVA  Airway Management Planned: Natural Airway and Simple Face Mask  Additional Equipment: None  Intra-op Plan:   Post-operative Plan:   Informed Consent: I have reviewed the patients History and Physical, chart, labs and discussed the procedure including the risks, benefits and alternatives for the proposed anesthesia with the patient or authorized representative who has indicated his/her understanding and acceptance.        Plan Discussed with: CRNA  Anesthesia Plan Comments:     Anesthesia Quick Evaluation

## 2019-01-27 NOTE — Progress Notes (Signed)
Initial Nutrition Assessment  RD working remotely.   DOCUMENTATION CODES:   Not applicable  INTERVENTION:  - diet advancement as medically feasible.   NUTRITION DIAGNOSIS:   Increased nutrient needs related to post-op healing as evidenced by estimated needs.  GOAL:   Patient will meet greater than or equal to 90% of their needs  MONITOR:   Diet advancement, Labs, Weight trends, Skin  REASON FOR ASSESSMENT:   Consult Assessment of nutrition requirement/status  ASSESSMENT:   73 y.o. female with medical history significant of allergic rhinitis, unspecified CKD, depression, arthritis of the knee, GERD, hyperlipidemia, migraine headaches, basal tremors, osteoporosis, and vitamin D deficiency. She presented to the ED from [place] after an unwitnessed fall while trying to use the bathroom. She was at the Center for elective eye surgery.  Patient was given a carton of Ensure Pre-surgery last night at 2145. She was ordered a Heart Healthy diet yesterday at 1233 and then made NPO at midnight; no intakes documented.  Patient is currently out of the room to OR. She sustained a closed L hip fracture at the time of her fall and is in surgery for repair.   Per chart review, current weight is 172 lb and weight is the highest it has been in >2 years.   Labs reviewed; GFR: 56 ml/min. Medications reviewed; 1 tablet senokot BID.  IVF; LR @ 50 ml/hr.     NUTRITION - FOCUSED PHYSICAL EXAM:  unable to complete at this time.   Diet Order:   Diet Order            Diet NPO time specified  Diet effective midnight              EDUCATION NEEDS:   No education needs have been identified at this time  Skin:  Skin Assessment: Reviewed RN Assessment  Last BM:  12/14  Height:   Ht Readings from Last 1 Encounters:  01/26/19 5\' 6"  (1.676 m)    Weight:   Wt Readings from Last 1 Encounters:  01/26/19 78 kg    Ideal Body Weight:  59.1 kg  BMI:  Body mass index is  27.75 kg/m.  Estimated Nutritional Needs:   Kcal:  1850-2100 kcal  Protein:  85-100 grams  Fluid:  >/= 1.8 L/day      Jarome Matin, MS, RD, LDN, [facility] Inpatient Clinical Dietitian Pager # [phone] After hours/weekend pager # [phone]

## 2019-01-27 NOTE — Op Note (Signed)
OPERATIVE REPORT   01/27/2019  2:02 PM  PATIENT:  Jennifer Estrada   SURGEON:  Bertram Savin, MD  ASSISTANT: Nehemiah Massed, PA-C.   PREOPERATIVE DIAGNOSIS: Left reverse obliquity intertrochanteric femur fracture/subtrochanteric femur fracture.  POSTOPERATIVE DIAGNOSIS:  Same.  PROCEDURE: Open reduction and intramedullary fixation of left femur.  ANESTHESIA:   GETA.  ANTIBIOTICS: 900 mg clindamycin.  MPLANTS: [employer]ker Gamma3 Hip Fracture Nail, 11 by 400 mm, 125 degrees. 10.5 x 100 mm Hip Fracture Nail Lag Screw. 5 x 47.5 mm distal interlocking screw 1.  SPECIMENS:  None.  COMPLICATIONS:  None.  DISPOSITION:  Stable to PACU.  BRIEF CLINICAL NOTE: Jennifer Estrada is a 73 y.o. female who presented with an intertrochanteric femur fracture. The patient was admitted to the hospitalist service and underwent perioperative risk stratification and medical optimization. The risks, benefits, and alternatives to the procedure were explained, and the patient elected to proceed.  PROCEDURE IN DETAIL: Surgical site was marked by myself. The patient was taken to the operating room and anesthesia was induced on the bed. The patient was then transferred to the [facility] table and the nonoperative lower extremity was scissored underneath the operative side. The fracture was reduced with traction, internal rotation, and adduction. The hip was prepped and draped in the normal sterile surgical fashion. Timeout was called verifying side and site of surgery. Preop antibiotics were given with 60 minutes of beginning the procedure.  Fluoroscopy was used to define the patient's anatomy.  The fracture remained in varus and apex anterior alignment.  I made a longitudinal incision along the lateral femur directly over the fracture site.  The IT band was incised as well as the underlying vastus lateralis.  I placed a [place] claw clamp subperiosteally and was able to obtain an essentially anatomic reduction.   A 4 cm incision was made just proximal to the tip of the greater trochanter. The awl was used to obtain the standard starting point for a trochanteric entry nail under fluoroscopic control. The guidepin was placed. The entry reamer was used to open the proximal femur.  I placed the guidewire to the level of the physeal scar of the knee. I measured the length of the guidewire. Sequential reaming was performed up to a size 12.5 mm with excellent chatter. Therefore, a size 11 by 400 mm nail was selected and assembled to the jig on the back table. The nail was placed without any difficulty. Through a separate stab incision, the cannula was placed down to the bone in preparation for the cephalomedullary device. A guidepin was placed into the femoral head using AP and lateral fluoroscopy views. The pin was measured, and then reaming was performed to the appropriate depth. The lag screw was inserted to the appropriate depth. The fracture was compressed through the jig. The setscrew was tightened. Using perfect circle technique, a distal interlocking screw was placed. The jig was removed.  The [place] claw was removed.  Final AP and lateral fluoroscopy views were obtained to confirm fracture reduction and hardware placement. Tip apex distance was appropriate. There was no chondral penetration.  The wounds were copiously irrigated with saline. The wound was closed in layers with #1 Vicryl for the fascia, 2-0 Monocryl for the deep dermal layer, and 3-0 Monocryl subcuticular stitch. Glue was applied to the skin. Once the glue was fully hardened, sterile dressing was applied. The patient was then awakened from anesthesia and taken to the PACU in stable condition. Sponge needle and instrument counts  were correct at the end of the case 2. There were no known complications.  Please note that a surgical assistant was a medical necessity for this procedure to perform it in a safe and expeditious manner. Assistant was  necessary to provide appropriate retraction of vital neurovascular structures, to prevent femoral fracture, and to allow for anatomic placement of the prosthesis.  POSTOPERATIVE PLAN:  We will readmit the patient to the hospitalist. Weightbearing status will be touchdown weightbearing with a walker. We will begin Lovenox for DVT prophylaxis. The patient will work with physical therapy and undergo disposition planning.

## 2019-01-27 NOTE — Anesthesia Procedure Notes (Signed)
Spinal  Patient location during procedure: OR Start time: 01/27/2019 12:05 PM End time: 01/27/2019 12:10 PM Staffing Performed: anesthesiologist  Anesthesiologist: Pervis Hocking, DO Preanesthetic Checklist Completed: patient identified, IV checked, risks and benefits discussed, surgical consent, monitors and equipment checked, pre-op evaluation and timeout performed Spinal Block Patient position: sitting Prep: DuraPrep and site prepped and draped Patient monitoring: cardiac monitor, continuous pulse ox and blood pressure Approach: midline Location: L3-4 Injection technique: single-shot Needle Needle type: Pencan  Needle gauge: 24 G Needle length: 9 cm Assessment Sensory level: T6 Additional Notes Functioning IV was confirmed and monitors were applied. Sterile prep and drape, including hand hygiene and sterile gloves were used. The patient was positioned and the spine was prepped. The skin was anesthetized with lidocaine.  Free flow of clear CSF was obtained prior to injecting local anesthetic into the CSF.  The spinal needle aspirated freely following injection.  The needle was carefully withdrawn.  The patient tolerated the procedure well.

## 2019-01-27 NOTE — Transfer of Care (Signed)
Immediate Anesthesia Transfer of Care Note  Patient: Jennifer Estrada  Procedure(s) Performed: INTRAMEDULLARY (IM) NAIL INTERTROCHANTRIC (Left )  Patient Location: PACU  Anesthesia Type:MAC and Spinal  Level of Consciousness: awake, alert , oriented and patient cooperative  Airway & Oxygen Therapy: Patient Spontanous Breathing and Patient connected to face mask oxygen  Post-op Assessment: Report given to RN, Post -op Vital signs reviewed and stable and Patient moving all extremities  Post vital signs: Reviewed and stable  Last Vitals:  Vitals Value Taken Time  BP 142/92 01/27/19 1445  Temp    Pulse 80 01/27/19 1446  Resp 14 01/27/19 1446  SpO2 99 % 01/27/19 1446  Vitals shown include unvalidated device data.  Last Pain:  Vitals:   01/27/19 0839  TempSrc:   PainSc: 3       Patients Stated Pain Goal: 3 ([id number])  Complications: No apparent anesthesia complications

## 2019-01-27 NOTE — Progress Notes (Addendum)
PROGRESS NOTE    Jennifer Estrada  GEX:[id number] DOB: 25-Jun-1945 DOA: 01/26/2019 PCP: Merlene Laughter, MD   Brief Narrative:   Jennifer Estrada is a 73 y.o. female with medical history significant of allergic rhinitis, unspecified CKD, depression, arthritis of the knee, GERD, hyperlipidemia, migraine headaches, basal tremors, osteoporosis, vitamin D deficiency who is coming to the emergency department from the surgical center of [place], where she was waiting to have elective eye surgery, after having an unwitnessed fall while she was trying to use the bathroom falling on her left side injuring her LLE and left ear.  The patient states that she had to go to the bathroom and felt that her stomach did not feel well after she had the Inderal on an empty stomach earlier in the morning.  She does not remember what happened before she fell and does not think that she passed out for a long period of time.  She denies chest pain, palpitations, dizziness, diaphoresis, PND, orthopnea or recent pitting edema of the lower extremities  left hip x-ray shows a mildly displaced and varus angulated intertrochanteric fracture of the proximal left femur.  There was no dislocation.  She has been admitted for further surgical management.   Assessment & Plan:   Principal Problem:   Closed left hip fracture, initial encounter (HCC) Active Problems:   Tremors of nervous system   GERD (gastroesophageal reflux disease)   Hyperlipidemia   Depression   Normocytic anemia   Syncope     Closed left hip fracture, initial encounter (HCC) Admit to telemetry/inpatient. N.p.o. after midnight. Analgesics as needed. Muscle relaxants as needed. Antiemetics as needed. PT/OT evaluation. Dietitian evaluation of nutritional needs. Going for ORIF today   Active Problems:   Syncope Vasovagal vs/plus Inderal on empty stomach.  We will get orthostatic vital    Tremors of nervous system Continue Inderal 60 mg p.o.  daily.    GERD (gastroesophageal reflux disease) Continue PPI.    Hyperlipidemia On Crestor 5 mg p.o. bedtime.    Depression Continue paroxetine 20 mg p.o. daily.    Normocytic anemia Monitor H&H.   AKI / CKD 3 -Baseline creatinine not known.  We will continue to follow. Check CK to rule out rhabdomyolysis.   DVT prophylaxis: SCDs. Code Status: Full code. Family Communication:  Disposition Plan: Admit for pain control, orthopedic surgery evaluation and procedure Consults called: Orthopedic surgery (Dr. Linna Caprice). Admission status: Inpatient/telemetry.    Consultants:   Procedures:   Antimicrobials:    Subjective:  Patient is resting comfortable.  She is complaining of headache since she did not get her propranolol.  She does not recall the event of fall but seems to be a component of probably hypotension since she took propanolol on empty stomach and/or hypoglycemia. She will get her hip fracture repair surgery today.   Objective: Vitals:   01/26/19 2200 01/27/19 0214 01/27/19 0549 01/27/19 1039  BP:  106/67 107/66 (!) 107/37  Pulse:  81 73 94  Resp:  Temp:  98.6 F (37 C) 98.3 F (36.8 C) 97.9 F (36.6 C)  TempSrc:      SpO2:  100% 96% 95%  Weight: 78 kg     Height:  (1.676 m)       Intake/Output Summary (Last 24 hours) at 01/27/2019 1226 Last data filed at 01/27/2019 0200 Gross per 24 hour  Intake 296 ml  Output --  Net 296 ml   Filed Weights   01/26/19 2200  Weight: 78 kg    Examination:  General exam: Appears calm and comfortable , in mild distress due to pain Respiratory system: Clear to auscultation. Respiratory effort normal. Cardiovascular system: S1 & S2 heard, RRR. No JVD, murmurs, rubs, gallops or clicks. No pedal edema. Gastrointestinal system: Abdomen is nondistended, soft and nontender. No organomegaly or masses felt. Normal bowel sounds heard. Central nervous system: Alert and oriented. No focal  neurological deficits. Extremities: Symmetric 5 x 5 power except for left leg . Skin: No rashes, lesions or ulcers Psychiatry: Judgement and insight appear normal. Mood & affect appropriate.    Left leg is externally rotated, extremely tender to touch especially in thigh, unable to lift due to pain, is able to wiggle toes.  Sensations intact.   Data Reviewed: I have personally reviewed following labs and imaging studies  CBC: Recent Labs  Lab 01/26/19 0931 01/27/19 0545  WBC 7.1 8.4  NEUTROABS 4.4  --   HGB 11.3* 11.0*  HCT 36.4 34.8*  MCV 97.6 98.0  PLT 185 194   Basic Metabolic Panel: Recent Labs  Lab 01/26/19 0931 01/27/19 0545  NA 139 139  K 4.5 4.3  CL 105 106  CO2 23 26  GLUCOSE 92 107*  BUN 14 14  CREATININE 1.01* 0.99  CALCIUM 9.5 9.0   GFR: Estimated Creatinine Clearance: 52.6 mL/min (by C-G formula based on SCr of 0.99 mg/dL). Liver Function Tests: Recent Labs  Lab 01/27/19 0545  AST 19  ALT 19  ALKPHOS 47  BILITOT 1.4*  PROT 5.8*  ALBUMIN 3.1*   No results for input(s): LIPASE, AMYLASE in the last 168 hours. No results for input(s): AMMONIA in the last 168 hours. Coagulation Profile: Recent Labs  Lab 01/26/19 0931  INR 1.0   Cardiac Enzymes: No results for input(s): CKTOTAL, CKMB, CKMBINDEX, TROPONINI in the last 168 hours. BNP (last 3 results) No results for input(s): PROBNP in the last 8760 hours. HbA1C: No results for input(s): HGBA1C in the last 72 hours. CBG: No results for input(s): GLUCAP in the last 168 hours. Lipid Profile: No results for input(s): CHOL, HDL, LDLCALC, TRIG, CHOLHDL, LDLDIRECT in the last 72 hours. Thyroid Function Tests: No results for input(s): TSH, T4TOTAL, FREET4, T3FREE, THYROIDAB in the last 72 hours. Anemia Panel: No results for input(s): VITAMINB12, FOLATE, FERRITIN, TIBC, IRON, RETICCTPCT in the last 72 hours. Sepsis Labs: No results for input(s): PROCALCITON, LATICACIDVEN in the last 168  hours.  Recent Results (from the past 240 hour(s))  Respiratory Panel by RT PCR (Flu A&B, Covid) - Nasopharyngeal Swab     Status: None   Collection Time: 01/26/19 11:44 AM   Specimen: Nasopharyngeal Swab  Result Value Ref Range Status   SARS Coronavirus 2 by RT PCR NEGATIVE NEGATIVE Final    Comment: (NOTE) SARS-CoV-2 target nucleic acids are NOT DETECTED. The SARS-CoV-2 RNA is generally detectable in upper respiratoy specimens during the acute phase of infection. The lowest concentration of SARS-CoV-2 viral copies this assay can detect is 131 copies/mL. A negative result does not preclude SARS-Cov-2 infection and should not be used as the sole basis for treatment or other patient management decisions. A negative result may occur with  improper specimen collection/handling, submission of specimen other than nasopharyngeal swab, presence of viral mutation(s) within the areas targeted by this assay, and inadequate number of viral copies (<131 copies/mL). A negative result must be combined with clinical observations, patient history, and epidemiological information. The expected result is Negative. Fact Sheet for Patients:  https://www.moore.com/https://www.fda.gov/media/142436/download Fact Sheet for Healthcare Providers:  https://www.young.biz/https://www.fda.gov/media/142435/download This test is not yet ap proved or cleared by the [place] FDA and  has been authorized for detection and/or diagnosis of SARS-CoV-2 by FDA under an Emergency Use Authorization (EUA). This EUA will remain  in effect (meaning this test can be used) for the duration of the COVID-19 declaration under Section 564(b)(1) of the Act, 21 U.S.C. section 360bbb-3(b)(1), unless the authorization is terminated or revoked sooner.    Influenza A by PCR NEGATIVE NEGATIVE Final   Influenza B by PCR NEGATIVE NEGATIVE Final    Comment: (NOTE) The Xpert Xpress SARS-CoV-2/FLU/RSV assay is intended as an aid in  the diagnosis of influenza from Nasopharyngeal  swab specimens and  should not be used as a sole basis for treatment. Nasal washings and  aspirates are unacceptable for Xpert Xpress SARS-CoV-2/FLU/RSV  testing. Fact Sheet for Patients: https://www.moore.com/https://www.fda.gov/media/142436/download Fact Sheet for Healthcare Providers: https://www.young.biz/https://www.fda.gov/media/142435/download This test is not yet approved or cleared by the [place] FDA and  has been authorized for detection and/or diagnosis of SARS-CoV-2 by  FDA under an Emergency Use Authorization (EUA). This EUA will remain  in effect (meaning this test can be used) for the duration of the  Covid-19 declaration under Section 564(b)(1) of the Act, 21  U.S.C. section 360bbb-3(b)(1), unless the authorization is  terminated or revoked. Performed at [facility], [address] [place], [place] [address]   Surgical PCR screen     Status: None   Collection Time: 01/27/19  1:08 AM   Specimen: Nasal Mucosa; Nasal Swab  Result Value Ref Range Status   MRSA, PCR NEGATIVE NEGATIVE Final   Staphylococcus aureus NEGATIVE NEGATIVE Final    Comment: (NOTE) The Xpert SA Assay (FDA approved for NASAL specimens in patients 722 years of age and older), is one component of a comprehensive surveillance program. It is not intended to diagnose infection nor to guide or monitor treatment. Performed at [facility], [address] [place], [place] [address]          Radiology Studies: DG Chest 1 View  Result Date: 01/26/2019 CLINICAL DATA:  [facility] pt was about to have cataract surgery-- pt has a unwitnessed fall injuring LLE. +shortening, obvious deformity, swelling, pain. No prior injury to L hip. EXAM: CHEST  1 VIEW COMPARISON:  05/02/2012 FINDINGS: Cardiac silhouette is normal in size. No mediastinal or hilar masses. No evidence of adenopathy. Mild scarring noted at the lung apices. Lungs otherwise clear. No pleural  effusion or pneumothorax. Skeletal structures are grossly intact. IMPRESSION: No acute cardiopulmonary disease. Electronically Signed   By: Amie Portlandavid  Ormond M.D.   On: 01/26/2019 10:11   CT Head Wo Contrast  Result Date: 01/26/2019 CLINICAL DATA:  Unwitnessed fall. EXAM: CT HEAD WITHOUT CONTRAST TECHNIQUE: Contiguous axial images were obtained from the base of the skull through the vertex without intravenous contrast. COMPARISON:  February 07, 2014. FINDINGS: Brain: Mild chronic ischemic white matter disease is noted. No mass effect or midline shift is noted. Ventricular size is within normal limits. There is no evidence of mass lesion, hemorrhage or acute infarction. Vascular: No hyperdense vessel or unexpected calcification. Skull: Normal. Negative for fracture or focal lesion. Sinuses/Orbits: No acute finding. Other: None. IMPRESSION: Mild chronic ischemic white matter disease. No acute intracranial abnormality seen. Electronically Signed   By: Lupita RaiderJames  Green Jr M.D.   On: 01/26/2019 10:21   DG Knee Complete 4  Views Left  Result Date: 01/26/2019 CLINICAL DATA:  Prior left hip fracture. Insert EXAM: LEFT KNEE - COMPLETE 4+ VIEW COMPARISON:  05/20/2017 images, report not available. FINDINGS: Lateral view is inverted. AP and oblique views are not centered. Surgical screws noted in the distal femur appear to be intact. Diffuse osteopenia and degenerative change. No acute bony abnormality identified. IMPRESSION: Limited exam. No acute bony or joint abnormality identified. Surgical screws in the distal left femur intact. Diffuse degenerative change. Electronically Signed   By: Maisie Fus  Register   On: 01/26/2019 14:36   ECHOCARDIOGRAM COMPLETE  Result Date: 01/26/2019   ECHOCARDIOGRAM REPORT   Patient Name:   JAYSA KISE Date of Exam: 01/26/2019 Medical Rec #:  [id number]         Height:       66.0 in Accession #:    [id number]        Weight:       166.0 lb Date of Birth:  1945-10-27        BSA:           1.85 m Patient Age:    73 years          BP:           135/83 mmHg Patient Gender: F                 HR:           70 bpm. Exam Location:  Inpatient Procedure: 2D Echo Indications:    syncope 780.2  History:        Patient has prior history of Echocardiogram examinations, most                 recent 05/02/2012. Risk Factors:Dyslipidemia.  Sonographer:    Celene Skeen RDCS (AE) Referring Phys: [id number] DAVID MANUEL ORTIZ  Sonographer Comments: restricted mobility IMPRESSIONS  1. Left ventricular ejection fraction, by visual estimation, is 60 to 65%. The left ventricle has normal function. There is no left ventricular hypertrophy.  2. The left ventricle has no regional wall motion abnormalities.  3. Global right ventricle has normal systolic function.The right ventricular size is normal. No increase in right ventricular wall thickness.  4. Left atrial size was normal.  5. Right atrial size was normal.  6. The mitral valve is normal in structure. No evidence of mitral valve regurgitation. No evidence of mitral stenosis.  7. The tricuspid valve is normal in structure. Tricuspid valve regurgitation is not demonstrated.  8. The aortic valve is normal in structure. Aortic valve regurgitation is not visualized. No evidence of aortic valve sclerosis or stenosis.  9. The pulmonic valve was normal in structure. Pulmonic valve regurgitation is not visualized. 10. The inferior vena cava is normal in size with greater than 50% respiratory variability, suggesting right atrial pressure of 3 mmHg. FINDINGS  Left Ventricle: Left ventricular ejection fraction, by visual estimation, is 60 to 65%. The left ventricle has normal function. The left ventricle has no regional wall motion abnormalities. There is no left ventricular hypertrophy. Left ventricular diastolic parameters were normal. Indeterminate filling pressures. Right Ventricle: The right ventricular size is normal. No increase in right ventricular wall thickness. Global RV  systolic function is has normal systolic function. Left Atrium: Left atrial size was normal in size. Right Atrium: Right atrial size was normal in size Pericardium: There is no evidence of pericardial effusion. Mitral Valve: The mitral valve is normal in structure. No evidence of mitral valve regurgitation. No evidence  of mitral valve stenosis by observation. Tricuspid Valve: The tricuspid valve is normal in structure. Tricuspid valve regurgitation is not demonstrated. Aortic Valve: The aortic valve is normal in structure. Aortic valve regurgitation is not visualized. The aortic valve is structurally normal, with no evidence of sclerosis or stenosis. Pulmonic Valve: The pulmonic valve was normal in structure. Pulmonic valve regurgitation is not visualized. Pulmonic regurgitation is not visualized. Aorta: The aortic root, ascending aorta and aortic arch are all structurally normal, with no evidence of dilitation or obstruction. Venous: The inferior vena cava is normal in size with greater than 50% respiratory variability, suggesting right atrial pressure of 3 mmHg. IAS/Shunts: No atrial level shunt detected by color flow Doppler. There is no evidence of a patent foramen ovale. No ventricular septal defect is seen or detected. There is no evidence of an atrial septal defect.  LEFT VENTRICLE PLAX 2D LVIDd:         3.50 cm  Diastology LVIDs:         2.20 cm  LV e' lateral:   7.62 cm/s LV PW:         0.90 cm  LV E/e' lateral: 9.6 LV IVS:        0.70 cm  LV e' medial:    6.85 cm/s LVOT diam:     2.00 cm  LV E/e' medial:  10.7 LV SV:         35 ml LV SV Index:   18.36 LVOT Area:     3.14 cm  RIGHT VENTRICLE RV S prime:     11.20 cm/s TAPSE (M-mode): 1.6 cm LEFT ATRIUM             Index       RIGHT ATRIUM           Index LA diam:        3.70 cm 2.00 cm/m  RA Area:     12.80 cm LA Vol (A2C):   23.9 ml 12.94 ml/m RA Volume:   29.50 ml  15.97 ml/m LA Vol (A4C):   25.4 ml 13.75 ml/m LA Biplane Vol: 25.4 ml 13.75 ml/m   AORTIC VALVE LVOT Vmax:   95.30 cm/s LVOT Vmean:  57.500 cm/s LVOT VTI:    0.171 m  AORTA Ao Root diam: 2.60 cm MITRAL VALVE MV Area (PHT): 2.96 cm             SHUNTS MV PHT:        74.24 msec           Systemic VTI:  0.17 m MV Decel Time: 256 msec             Systemic Diam: 2.00 cm MV E velocity: 73.30 cm/s 103 cm/s MV A velocity: 77.60 cm/s 70.3 cm/s MV E/A ratio:  0.94       1.5  Mihai Croitoru MD Electronically signed by Sanda Klein MD Signature Date/Time: 01/26/2019/3:55:50 PM    Final    DG Hip Unilat With Pelvis 2-3 Views Left  Result Date: 01/26/2019 CLINICAL DATA:  [facility]ported [facility] from [place] where pt was about to have cataract surgery-- pt has a unwitnessed fall injuring LLE. +shortening, obvious deformity, swelling, pain. No prior injury to L hip. EXAM: DG HIP (WITH OR WITHOUT PELVIS) 2-3V LEFT COMPARISON:  None. FINDINGS: Intertrochanteric fracture of the proximal left femur. A fracture line crosses base of the femoral neck with another extending superior to the lesser trochanter to the lateral proximal  femoral metaphysis. Proximal fracture component is displaced laterally by 1.7 cm, with mild varus angulation. No other fractures.  Hip joints normally spaced and aligned. IMPRESSION: 1. Mildly displaced and varus angulated intertrochanteric fracture of the proximal left femur. No dislocation. Electronically Signed   By: Amie Portland M.D.   On: 01/26/2019 10:13        Scheduled Meds: . ketorolac  1 drop Right Eye QID  . loratadine  10 mg Oral Daily  . mirabegron ER  50 mg Oral QHS  . [MAR Hold] mupirocin ointment  1 application Nasal BID  . ofloxacin  1 drop Right Eye QID  . pantoprazole  40 mg Oral Daily  . PARoxetine  20 mg Oral q morning - 10a  . prednisoLONE acetate  1 drop Left Eye BID  . propranolol ER  60 mg Oral Daily  . rosuvastatin  5 mg Oral QHS  . [MAR Hold] senna  1 tablet Oral BID  . trimethoprim-polymyxin b  1 drop Right Eye QID  .  Vitamin D (Ergocalciferol)  50,000 Units Oral Q7 days   Continuous Infusions: . lactated ringers 50 mL/hr at 01/27/19 1046  . [MAR Hold] methocarbamol (ROBAXIN) IV       LOS: 1 day    Time spent:     Kamill Fulbright Hilbert Bible, MD [facility] Pager [phone]  If 7PM-7AM, please contact night-coverage [email or web address] Password [facility] 01/27/2019, 12:26 PM

## 2019-01-28 LAB — CBC
HCT: 27.8 % — ABNORMAL LOW (ref 36.0–46.0)
Hemoglobin: 8.6 g/dL — ABNORMAL LOW (ref 12.0–15.0)
MCH: 30.3 pg (ref 26.0–34.0)
MCHC: 30.9 g/dL (ref 30.0–36.0)
MCV: 97.9 fL (ref 80.0–100.0)
Platelets: 147 10*3/uL — ABNORMAL LOW (ref 150–400)
RBC: 2.84 MIL/uL — ABNORMAL LOW (ref 3.87–5.11)
RDW: 13.5 % (ref 11.5–15.5)
WBC: 9.2 10*3/uL (ref 4.0–10.5)
nRBC: 0 % (ref 0.0–0.2)

## 2019-01-28 LAB — BASIC METABOLIC PANEL
Anion gap: 8 (ref 5–15)
BUN: 15 mg/dL (ref 8–23)
CO2: 23 mmol/L (ref 22–32)
Calcium: 8.4 mg/dL — ABNORMAL LOW (ref 8.9–10.3)
Chloride: 104 mmol/L (ref 98–111)
Creatinine, Ser: 0.97 mg/dL (ref 0.44–1.00)
GFR calc Af Amer: >60 mL/min (ref >60)
GFR calc non Af Amer: 58 mL/min — ABNORMAL LOW (ref >60)
Glucose, Bld: 121 mg/dL — ABNORMAL HIGH (ref 70–99)
Potassium: 4.1 mmol/L (ref 3.5–5.1)
Sodium: 135 mmol/L (ref 135–145)

## 2019-01-28 MED ORDER — MAGNESIUM CITRATE PO SOLN
1.0000 | Freq: Once | ORAL | Status: DC
  Filled 2019-01-28: qty 296

## 2019-01-28 MED ORDER — HYDROCODONE-ACETAMINOPHEN 5-325 MG PO TABS: 1.0000 | ORAL_TABLET | ORAL | 0 refills | Status: DC | PRN

## 2019-01-28 MED ORDER — SODIUM CHLORIDE 0.9 % IV SOLN: Freq: Once | INTRAVENOUS | Status: AC

## 2019-01-28 MED ORDER — ENOXAPARIN SODIUM 40 MG/0.4ML ~~LOC~~ SOLN: 40.0000 mg | SUBCUTANEOUS | 0 refills | Status: DC

## 2019-01-28 NOTE — Progress Notes (Signed)
PROGRESS NOTE    Jennifer Estrada  AOZ:[id number] DOB: August 25, 1945 DOA: 01/26/2019 PCP: Jennifer Laughter, MD   Brief Narrative:   Jennifer Estrada is a 73 y.o. female with medical history significant of allergic rhinitis, unspecified CKD, depression, arthritis of the knee, GERD, hyperlipidemia, migraine headaches, basal tremors, osteoporosis, vitamin D deficiency who is coming to the emergency department from the surgical center of [place], where she was waiting to have elective eye surgery, after having an unwitnessed fall while she was trying to use the bathroom falling on her left side injuring her LLE and left ear.  The patient states that she had to go to the bathroom and felt that her stomach did not feel well after she had the Inderal on an empty stomach earlier in the morning.  She does not remember what happened before she fell and does not think that she passed out for a long period of time.  She denies chest pain, palpitations, dizziness, diaphoresis, PND, orthopnea or recent pitting edema of the lower extremities  left hip x-ray shows a mildly displaced and varus angulated intertrochanteric fracture of the proximal left femur.  There was no dislocation.  She has been admitted for further surgical management.   Assessment & Plan:   Principal Problem:   Closed left hip fracture, initial encounter (HCC) Active Problems:   Tremors of nervous system   GERD (gastroesophageal reflux disease)   Hyperlipidemia   Depression   Normocytic anemia   Syncope     Closed left hip fracture  ( Left reverse obliquity intertrochanteric femur fracture/subtrochanteric femur fracture.) , initial encounter (HCC) status Open reduction and intramedullary fixation of left femur.  Admit to telemetry/inpatient.  Analgesics as needed. Muscle relaxants as needed. Antiemetics as needed. PT/OT evaluation. Dietitian evaluation of nutritional needs.      Syncope Vasovagal vs/plus Inderal on empty  stomach.  We will get orthostatic vital    Tremors of nervous system Continue Inderal 60 mg p.o. daily.    GERD (gastroesophageal reflux disease) Continue PPI.    Hyperlipidemia On Crestor 5 mg p.o. bedtime.    Depression Continue paroxetine 20 mg p.o. daily.    Normocytic anemia Monitor H&H.   AKI / CKD 3 -Baseline creatinine not known.  We will continue to follow. Check CK to rule out rhabdomyolysis.  Hypotension-we will give a trial of IV fluids.  DVT prophylaxis: SCDs.  Lovenox Code Status: Full code. Family Communication:  Disposition Plan: Admit for pain control, orthopedic surgery evaluation and procedure Consults called: Orthopedic surgery (Dr. Linna Estrada). Admission status: Inpatient/telemetry.    Consultants:   Procedures:   Antimicrobials:    Subjective:  Patient is resting comfortable.  She seems to be somewhat confused.  She mentioned that she was in pain overnight because of constipation, however nursing staff mentioned that she was comfortable.  Also her last bowel movement was 1 day ago.  She currently denies any complaints.  Her pain is well controlled.  She is eating and drinking fine, no trouble passing urine or stool.  She has a Foley catheter postop  Objective: Vitals:   01/28/19 1336 01/28/19 1341 01/28/19 1545 01/28/19 1739  BP: (!) 81/52 (!) 90/58 (!) 94/48 (!) 97/55  Pulse: 81  87 91  Resp: 12  15 13   Temp: 97.7 F (36.5 C)  98 F (36.7 C) 98.4 F (36.9 C)  TempSrc: Oral  Oral Oral  SpO2: 96%  98%   Weight:      Height:  Intake/Output Summary (Last 24 hours) at 01/28/2019 1758 Last data filed at 01/28/2019 1552 Gross per 24 hour  Intake 435.07 ml  Output 400 ml  Net 35.07 ml   Filed Weights   01/26/19 2200  Weight: 78 kg    Examination:  General exam: Appears calm and comfortable , in mild distress due to pain Respiratory system: Clear to auscultation. Respiratory effort normal. Cardiovascular system: S1  & S2 heard, RRR. No JVD, murmurs, rubs, gallops or clicks. No pedal edema. Gastrointestinal system: Abdomen is nondistended, soft and nontender. No organomegaly or masses felt. Normal bowel sounds heard. Central nervous system: Alert and oriented. No focal neurological deficits. Extremities: Symmetric 5 x 5 power except for left leg . Skin: No rashes, lesions or ulcers Psychiatry: Judgement and insight appear normal. Mood & affect appropriate.    Left leg in surgical dressing, not externally rotated anymore, unable to lift due to pain, is able to wiggle toes.  Sensations intact.   Data Reviewed: I have personally reviewed following labs and imaging studies  CBC: Recent Labs  Lab 01/26/19 0931 01/27/19 0545 01/27/19 1454 01/28/19 0531  WBC 7.1 8.4 14.8* 9.2  NEUTROABS 4.4  --   --   --   HGB 11.3* 11.0* 11.1* 8.6*  HCT 36.4 34.8* 35.6* 27.8*  MCV 97.6 98.0 99.7 97.9  PLT 185 194 198 147*   Basic Metabolic Panel: Recent Labs  Lab 01/26/19 0931 01/27/19 0545 01/27/19 1454 01/28/19 0531  NA 139 139  --  135  K 4.5 4.3  --  4.1  CL 105 106  --  104  CO2 23 26  --  23  GLUCOSE 92 107*  --  121*  BUN 14 14  --  15  CREATININE 1.01* 0.99 0.91 0.97  CALCIUM 9.5 9.0  --  8.4*   GFR: Estimated Creatinine Clearance: 53.7 mL/min (by C-G formula based on SCr of 0.97 mg/dL). Liver Function Tests: Recent Labs  Lab 01/27/19 0545  AST 19  ALT 19  ALKPHOS 47  BILITOT 1.4*  PROT 5.8*  ALBUMIN 3.1*   No results for input(s): LIPASE, AMYLASE in the last 168 hours. No results for input(s): AMMONIA in the last 168 hours. Coagulation Profile: Recent Labs  Lab 01/26/19 0931  INR 1.0   Cardiac Enzymes: No results for input(s): CKTOTAL, CKMB, CKMBINDEX, TROPONINI in the last 168 hours. BNP (last 3 results) No results for input(s): PROBNP in the last 8760 hours. HbA1C: No results for input(s): HGBA1C in the last 72 hours. CBG: No results for input(s): GLUCAP in the last 168  hours. Lipid Profile: No results for input(s): CHOL, HDL, LDLCALC, TRIG, CHOLHDL, LDLDIRECT in the last 72 hours. Thyroid Function Tests: No results for input(s): TSH, T4TOTAL, FREET4, T3FREE, THYROIDAB in the last 72 hours. Anemia Panel: No results for input(s): VITAMINB12, FOLATE, FERRITIN, TIBC, IRON, RETICCTPCT in the last 72 hours. Sepsis Labs: No results for input(s): PROCALCITON, LATICACIDVEN in the last 168 hours.  Recent Results (from the past 240 hour(s))  Respiratory Panel by RT PCR (Flu A&B, Covid) - Nasopharyngeal Swab     Status: None   Collection Time: 01/26/19 11:44 AM   Specimen: Nasopharyngeal Swab  Result Value Ref Range Status   SARS Coronavirus 2 by RT PCR NEGATIVE NEGATIVE Final    Comment: (NOTE) SARS-CoV-2 target nucleic acids are NOT DETECTED. The SARS-CoV-2 RNA is generally detectable in upper respiratoy specimens during the acute phase of infection. The lowest concentration of SARS-CoV-2 viral copies  this assay can detect is 131 copies/mL. A negative result does not preclude SARS-Cov-2 infection and should not be used as the sole basis for treatment or other patient management decisions. A negative result may occur with  improper specimen collection/handling, submission of specimen other than nasopharyngeal swab, presence of viral mutation(s) within the areas targeted by this assay, and inadequate number of viral copies (<131 copies/mL). A negative result must be combined with clinical observations, patient history, and epidemiological information. The expected result is Negative. Fact Sheet for Patients:  https://www.moore.com/ Fact Sheet for Healthcare Providers:  https://www.young.biz/ This test is not yet ap proved or cleared by the [place] FDA and  has been authorized for detection and/or diagnosis of SARS-CoV-2 by FDA under an Emergency Use Authorization (EUA). This EUA will remain  in effect (meaning  this test can be used) for the duration of the COVID-19 declaration under Section 564(b)(1) of the Act, 21 U.S.C. section 360bbb-3(b)(1), unless the authorization is terminated or revoked sooner.    Influenza A by PCR NEGATIVE NEGATIVE Final   Influenza B by PCR NEGATIVE NEGATIVE Final    Comment: (NOTE) The Xpert Xpress SARS-CoV-2/FLU/RSV assay is intended as an aid in  the diagnosis of influenza from Nasopharyngeal swab specimens and  should not be used as a sole basis for treatment. Nasal washings and  aspirates are unacceptable for Xpert Xpress SARS-CoV-2/FLU/RSV  testing. Fact Sheet for Patients: https://www.moore.com/ Fact Sheet for Healthcare Providers: https://www.young.biz/ This test is not yet approved or cleared by the [place] FDA and  has been authorized for detection and/or diagnosis of SARS-CoV-2 by  FDA under an Emergency Use Authorization (EUA). This EUA will remain  in effect (meaning this test can be used) for the duration of the  Covid-19 declaration under Section 564(b)(1) of the Act, 21  U.S.C. section 360bbb-3(b)(1), unless the authorization is  terminated or revoked. Performed at [facility], [address] [place], [place] [address]   Surgical PCR screen     Status: None   Collection Time: 01/27/19  1:08 AM   Specimen: Nasal Mucosa; Nasal Swab  Result Value Ref Range Status   MRSA, PCR NEGATIVE NEGATIVE Final   Staphylococcus aureus NEGATIVE NEGATIVE Final    Comment: (NOTE) The Xpert SA Assay (FDA approved for NASAL specimens in patients 33 years of age and older), is one component of a comprehensive surveillance program. It is not intended to diagnose infection nor to guide or monitor treatment. Performed at [facility], [address] [place], [place] [address]          Radiology Studies: Pelvis Portable  Result Date: 01/27/2019 CLINICAL DATA:  Status  post left intramedullary rod fixation. EXAM: PORTABLE PELVIS 1-2 VIEWS COMPARISON:  January 26, 2019. FINDINGS: Status post surgical internal fixation and intramedullary rod fixation of intertrochanteric fracture of proximal left femur. Significantly improved alignment of fracture components is noted. Expected postoperative changes are seen in the surrounding soft tissues. IMPRESSION: Status post surgical internal fixation of intertrochanteric fracture of proximal left femur. Significantly improved alignment of fracture components is noted. Electronically Signed   By: Lupita Raider M.D.   On: 01/27/2019 15:33   DG C-Arm 1-60 Min-No Report  Result Date: 01/27/2019 Fluoroscopy was utilized by the requesting physician.  No radiographic interpretation.   DG HIP OPERATIVE UNILAT W OR W/O PELVIS LEFT  Result Date: 01/27/2019 CLINICAL DATA:  Hip fracture repair FLUOROSCOPY TIME:  1 minutes 57 seconds. Images: 7 EXAM:  OPERATIVE LEFT HIP (WITH PELVIS IF PERFORMED) 7 VIEWS TECHNIQUE: Fluoroscopic spot image(s) were submitted for interpretation post-operatively. COMPARISON:  None. FINDINGS: The patient's left hip fracture is repaired throughout the study with a gamma nail and intramedullary femoral rod, affixed distally with a single interlocking screw. Hardware is in good position. IMPRESSION: Left hip fracture repair as above. Electronically Signed   By: Dorise Bullion III M.D   On: 01/27/2019 14:13   DG FEMUR PORT MIN 2 VIEWS LEFT  Result Date: 01/27/2019 CLINICAL DATA:  Status post left intramedullary rod femur fixation. EXAM: LEFT FEMUR PORTABLE 2 VIEWS COMPARISON:  January 26, 2019. FINDINGS: Status post surgical internal fixation and intramedullary rod fixation of left femur for treatment of intertrochanteric fracture of proximal left femur. Improved alignment of fracture components is noted. Expected postoperative changes are seen in the surrounding soft tissues. IMPRESSION: Status post surgical  internal fixation of intertrochanteric fracture of proximal left femur. Improved alignment of fracture components is noted. Expected postoperative changes are noted. Electronically Signed   By: Marijo Conception M.D.   On: 01/27/2019 15:34        Scheduled Meds: . docusate sodium  100 mg Oral BID  . enoxaparin (LOVENOX) injection  40 mg Subcutaneous Q24H  . loratadine  10 mg Oral Daily  . magnesium citrate  1 Bottle Oral Once  . mirabegron ER  50 mg Oral QHS  . mupirocin ointment  1 application Nasal BID  . ofloxacin  1 drop Right Eye QID  . pantoprazole  40 mg Oral Daily  . PARoxetine  20 mg Oral q morning - 10a  . prednisoLONE acetate  1 drop Left Eye BID  . propranolol ER  60 mg Oral Daily  . rosuvastatin  5 mg Oral QHS  . senna  1 tablet Oral BID  . [START ON 01/29/2019] Vitamin D (Ergocalciferol)  50,000 Units Oral Q7 days   Continuous Infusions: . methocarbamol (ROBAXIN) IV       LOS: 2 days    Time spent:     Jennifer Dubie Harmon Pier, MD [facility] Pager [phone]  If 7PM-7AM, please contact night-coverage [email or web address] Password [facility] 01/28/2019, 5:58 PM

## 2019-01-28 NOTE — TOC Progression Note (Signed)
Transition of Care [facility]) - Progression Note    Patient Details  Name: Jennifer Estrada MRN: [id number] Date of Birth: 09/09/1945  [facility] CM/SW Contact  Tyrick Dunagan, Juliann Pulse, RN Phone Number: 01/28/2019, 1:38 PM  Clinical Narrative:Spouse agreed to faxing out to SNF.Faxed out await bed offers.       Expected Discharge Plan: [place] Barriers to Discharge: Continued Medical Work up  Expected Discharge Plan and Services Expected Discharge Plan: [place]   Discharge Planning Services: CM Consult Post Acute Care Choice: [place] Living arrangements for the past 2 months: Single Family Home                                       Social Determinants of Health (SDOH) Interventions    Readmission Risk Interventions No flowsheet data found.

## 2019-01-28 NOTE — Progress Notes (Signed)
   01/28/19 1348  MEWS Assessment  Is this an acute change? No   BP was soft during the night and this morning, checked manually BP 90/58, paged MD and received order for 1L NS @75ml /hr x 1 bag

## 2019-01-28 NOTE — Evaluation (Signed)
Occupational Therapy Evaluation Patient Details Name: Jennifer Estrada MRN: [id number] DOB: 28-Apr-1945 Today's Date: 01/28/2019    History of Present Illness 73 y.o. female with medical history significant of allergic rhinitis, unspecified CKD, depression, arthritis of the knee, GERD, hyperlipidemia, migraine headaches, tremors, osteoporosis, vitamin D deficiency who is coming to the emergency department from the surgical center of [place], where she was waiting to have elective eye surgery, after having an unwitnessed fall while she was trying to use the bathroom falling on her left side.  Pt s/p ORIF left femur and currently TDWB status   Clinical Impression   Pt with decline in function and safety with ADLs and ADL mobility with impaired strength, balance and endurance. Pt also with some confusion. Pt lives at home with her husband and was independent with ADLs/selfcare and used a Product manager for mobility. Pt currently requires min A with UB ADLs, max - total A with LB ADLs, total A with toileting, max - total A with bed mobility and was unable to stand due to pain in L LE. Pt would benefit from acute OT services to address impairments to maximize level of function and safety    Follow Up Recommendations  SNF    Equipment Recommendations  Other (comment)(TBD at next venue of care)    Recommendations for Other Services       Precautions / Restrictions Precautions Precautions: Fall Restrictions Weight Bearing Restrictions: Yes LLE Weight Bearing: Touchdown weight bearing      Mobility Bed Mobility Overal bed mobility: Needs Assistance Bed Mobility: Supine to Sit;Sit to Supine     Supine to sit: Max assist Sit to supine: Total assist   General bed mobility comments: increased time and effort  Transfers Overall transfer level: Needs assistance Equipment used: Rolling walker (2 wheeled)   Sit to Stand: Total assist         General transfer comment: Pt unable to stand,p er  PT note pt unable to stand earlier today with 3 attempts    Balance Overall balance assessment: History of Falls                                         ADL either performed or assessed with clinical judgement   ADL Overall ADL's : Needs assistance/impaired Eating/Feeding: Set up;Independent;Sitting;Bed level   Grooming: Wash/dry hands;Wash/dry face;Min guard;Sitting   Upper Body Bathing: Minimal assistance;Sitting   Lower Body Bathing: Maximal assistance   Upper Body Dressing : Minimal assistance;Sitting   Lower Body Dressing: Total assistance     Toilet Transfer Details (indicate cue type and reason): unable Toileting- Clothing Manipulation and Hygiene: Total assistance;Bed level         General ADL Comments: pt unable to stand due to pain     Vision Baseline Vision/History: Wears glasses Wears Glasses: At all times Patient Visual Report: No change from baseline       Perception     Praxis      Pertinent Vitals/Pain Pain Assessment: Faces Faces Pain Scale: Hurts even more Pain Location: L LE Pain Descriptors / Indicators: Aching;Grimacing;Guarding;Sore Pain Intervention(s): Limited activity within patient's tolerance;Monitored during session;Premedicated before session;Repositioned     Hand Dominance Right   Extremity/Trunk Assessment Upper Extremity Assessment Upper Extremity Assessment: Generalized weakness   Lower Extremity Assessment Lower Extremity Assessment: Defer to PT evaluation LLE Deficits / Details: requiring assist LLE: Unable to fully assess due  to pain       Communication Communication Communication: No difficulties   Cognition Arousal/Alertness: Awake/alert Behavior During Therapy: WFL for tasks assessed/performed Overall Cognitive Status: Impaired/Different from baseline Area of Impairment: Following commands;Problem solving                       Following Commands: Follows one step commands with  increased time     Problem Solving: Slow processing;Difficulty sequencing;Requires verbal cues General Comments: per RN, pt confused at times   General Comments       Exercises     Shoulder Instructions      Home Living Family/patient expects to be discharged to:: Private residence Living Arrangements: Spouse/significant other   Type of Home: [place]: One level     Bathroom Shower/Tub: Walk-in shower;Tub/shower unit         Home Equipment: Walker - 2 wheels;Walker - 4 wheels;Bedside commode          Prior Functioning/Environment Level of Independence: Independent                 OT Problem List: Decreased strength;Impaired balance (sitting and/or standing);Decreased cognition;Decreased knowledge of precautions;Pain;Decreased activity tolerance;Decreased knowledge of use of DME or AE      OT Treatment/Interventions: Self-care/ADL training;DME and/or AE instruction;Therapeutic activities;Therapeutic exercise;Patient/family education    OT Goals(Current goals can be found in the care plan section) Acute Rehab OT Goals Patient Stated Goal: get better OT Goal Formulation: With patient/family Time For Goal Achievement: 02/11/19 Potential to Achieve Goals: Good ADL Goals Pt Will Perform Grooming: with supervision;with set-up;sitting Pt Will Perform Upper Body Bathing: with min guard assist;with supervision;with set-up;sitting Pt Will Perform Lower Body Bathing: with mod assist;sitting/lateral leans Pt Will Perform Upper Body Dressing: with min guard assist;with supervision;with set-up;sitting Pt Will Transfer to Toilet: with max assist;with mod assist;stand pivot transfer;bedside commode Additional ADL Goal #1: pt will complete bed mobility mod A to sit EOB in prep for ADLs/selfcare tasks  OT Frequency: Min 2X/week   Barriers to D/C: Decreased caregiver support          Co-evaluation              AM-PAC OT "6 Clicks" Daily Activity      Outcome Measure Help from another person eating meals?: None Help from another person taking care of personal grooming?: A Little Help from another person toileting, which includes using toliet, bedpan, or urinal?: Total Help from another person bathing (including washing, rinsing, drying)?: A Lot Help from another person to put on and taking off regular upper body clothing?: A Little Help from another person to put on and taking off regular lower body clothing?: Total 6 Click Score: 14   End of Session    Activity Tolerance: No increased pain Patient left: in bed;with call bell/phone within reach;with bed alarm set;with family/visitor present  OT Visit Diagnosis: Other abnormalities of gait and mobility (R26.89);Muscle weakness (generalized) (M62.81);History of falling (Z91.81);Pain;Other symptoms and signs involving cognitive function Pain - Right/Left: Left Pain - part of body: Hip;Leg                Time: 2683-4196 OT Time Calculation (min): 17 min Charges:  OT General Charges $OT Visit: 1 Visit OT Evaluation $OT Eval Moderate Complexity: 1 Mod    Britt Bottom 01/28/2019, 2:22 PM

## 2019-01-28 NOTE — Progress Notes (Signed)
Patients states she had a BM on 12/15 and did not want to take magnesium citrate now as she just finished eating, moved to this evening per request

## 2019-01-28 NOTE — NC FL2 (Signed)
[place] LEVEL OF CARE SCREENING TOOL     IDENTIFICATION  Patient Name: Jennifer Estrada Birthdate: 07-22-1945 Sex: female Admission Date (Current Location): 01/26/2019  [facility] and [place] Number:  Herbalist and Address:  [facility],  [place] [address], [place]      Provider Number: [id number]  Attending Physician Name and Address:  Vicenta Dunning, MD  Relative Name and Phone Number:  Chrissie Noa spouse [phone]    Current Level of Care: Hospital Recommended Level of Care: [place] Prior Approval Number:    Date Approved/Denied:   PASRR Number: [id number]  Discharge Plan: SNF    Current Diagnoses: Patient Active Problem List   Diagnosis Date Noted  . Closed left hip fracture, initial encounter ([place]) 01/26/2019  . Tremors of nervous system   . GERD (gastroesophageal reflux disease)   . Hyperlipidemia   . Depression   . Normocytic anemia   . Syncope   . Pain in both knees 05/20/2017  . Patella fracture 02/12/2014  . Migraine syndrome 05/02/2012  . Anxiety attack 05/02/2012  . TIA (transient ischemic attack) 05/02/2012    Orientation RESPIRATION BLADDER Height & Weight     Self, Time, Situation  Normal Continent Weight: 78 kg Height:  5\' 6"  (167.6 cm)  BEHAVIORAL SYMPTOMS/MOOD NEUROLOGICAL BOWEL NUTRITION STATUS      Continent Diet(Regular)  AMBULATORY STATUS COMMUNICATION OF NEEDS Skin   Limited Assist Verbally Normal                       Personal Care Assistance Level of Assistance  Bathing, Feeding, Dressing Bathing Assistance: Limited assistance Feeding assistance: Limited assistance Dressing Assistance: Limited assistance     Functional Limitations Info  Sight, Hearing, Speech Sight Info: Adequate Hearing Info: Adequate Speech Info: Adequate    SPECIAL CARE FACTORS FREQUENCY                       Contractures Contractures Info: Not present    Additional  Factors Info  Code Status, Allergies Code Status Info: Full code Allergies Info: Atorvastatin, Cortisone, Oxycodone, Paroxetine, Penicillins, Pravastatin, Singulair Montelukast, Sulfa Antibiotics, Aleve Naproxen Sodium, Augmentin Amoxicillin-pot Clavulanate, Prednisone           Current Medications (01/28/2019):  This is the current hospital active medication list Current Facility-Administered Medications  Medication Dose Route Frequency Provider Last Rate Last Admin  . acetaminophen (TYLENOL) tablet 325-650 mg  325-650 mg Oral Q6H PRN Swinteck, Aaron Edelman, MD      . docusate sodium (COLACE) capsule 100 mg  100 mg Oral Daily PRN Swinteck, Aaron Edelman, MD      . docusate sodium (COLACE) capsule 100 mg  100 mg Oral BID Rod Can, MD   100 mg at 01/28/19 1008  . enoxaparin (LOVENOX) injection 40 mg  40 mg Subcutaneous Q24H Rod Can, MD   40 mg at 01/28/19 0739  . fentaNYL (SUBLIMAZE) injection 50 mcg  50 mcg Intravenous Q2H PRN Rod Can, MD   50 mcg at 01/28/19 0736  . HYDROcodone-acetaminophen (NORCO) 7.5-325 MG per tablet 1-2 tablet  1-2 tablet Oral Q4H PRN Rod Can, MD   1 tablet at 01/28/19 1008  . HYDROcodone-acetaminophen (NORCO/VICODIN) 5-325 MG per tablet 1-2 tablet  1-2 tablet Oral Q4H PRN Swinteck, Aaron Edelman, MD      . loratadine (CLARITIN) tablet 10 mg  10 mg Oral Daily Swinteck, Aaron Edelman, MD   10 mg at  01/28/19 1008  . magnesium citrate solution 1 Bottle  1 Bottle Oral Once Sheth, Devam P, MD      . magnesium hydroxide (MILK OF MAGNESIA) suspension 30 mL  30 mL Oral Daily PRN Swinteck, Arlys John, MD      . menthol-cetylpyridinium (CEPACOL) lozenge 3 mg  1 lozenge Oral PRN Swinteck, Arlys John, MD       Or  . phenol (CHLORASEPTIC) mouth spray 1 spray  1 spray Mouth/Throat PRN Swinteck, Arlys John, MD      . methocarbamol (ROBAXIN) tablet 500 mg  500 mg Oral Q6H PRN Swinteck, Arlys John, MD       Or  . methocarbamol (ROBAXIN) 500 mg in dextrose 5 % 50 mL IVPB  500 mg Intravenous Q8H PRN  Swinteck, Arlys John, MD      . metoCLOPramide (REGLAN) tablet 5-10 mg  5-10 mg Oral Q8H PRN Swinteck, Arlys John, MD       Or  . metoCLOPramide (REGLAN) injection 5-10 mg  5-10 mg Intravenous Q8H PRN Swinteck, Arlys John, MD      . mirabegron ER (MYRBETRIQ) tablet 50 mg  50 mg Oral QHS Samson Frederic, MD   50 mg at 01/27/19 2249  . mupirocin ointment (BACTROBAN) 2 % 1 application  1 application Nasal BID Samson Frederic, MD   1 application at 01/28/19 1005  . ofloxacin (OCUFLOX) 0.3 % ophthalmic solution 1 drop  1 drop Right Eye QID Samson Frederic, MD   1 drop at 01/28/19 1307  . ondansetron (ZOFRAN) tablet 4 mg  4 mg Oral Q6H PRN Swinteck, Arlys John, MD       Or  . ondansetron (ZOFRAN) injection 4 mg  4 mg Intravenous Q6H PRN Samson Frederic, MD   4 mg at 01/28/19 0755  . pantoprazole (PROTONIX) EC tablet 40 mg  40 mg Oral Daily Samson Frederic, MD   40 mg at 01/28/19 1008  . PARoxetine (PAXIL) tablet 20 mg  20 mg Oral q morning - 10a Samson Frederic, MD   20 mg at 01/28/19 1008  . prednisoLONE acetate (PRED FORTE) 1 % ophthalmic suspension 1 drop  1 drop Left Eye BID Samson Frederic, MD   1 drop at 01/28/19 1005  . propranolol ER (INDERAL LA) 24 hr capsule 60 mg  60 mg Oral Daily Samson Frederic, MD   60 mg at 01/27/19 1804  . rosuvastatin (CRESTOR) tablet 5 mg  5 mg Oral QHS Samson Frederic, MD   5 mg at 01/27/19 2249  . senna (SENOKOT) tablet 8.6 mg  1 tablet Oral BID Samson Frederic, MD   8.6 mg at 01/28/19 1008  . [START ON 01/29/2019] Vitamin D (Ergocalciferol) (DRISDOL) capsule 50,000 Units  50,000 Units Oral Q7 days Saundra Shelling, MD         Discharge Medications: Please see discharge summary for a list of discharge medications.  Relevant Imaging Results:  Relevant Lab Results:   Additional Information ss#[id number]  Yuleni Burich, Olegario Messier, [place]

## 2019-01-28 NOTE — Progress Notes (Signed)
    Subjective:  Patient reports pain as mild to moderate.  Denies N/V/CP/SOB. C/o L hip pain.  Objective:   VITALS:   Vitals:   01/28/19 0459 01/28/19 1010 01/28/19 1336 01/28/19 1341  BP: (!) 93/50 (!) 96/58 (!) 81/52 (!) 90/58  Pulse: 83  81   Resp: 20  12   Temp: 98.4 F (36.9 C)  97.7 F (36.5 C)   TempSrc: Oral  Oral   SpO2: 93%  96%   Weight:      Height:        NAD ABD soft Sensation intact distally Intact pulses distally Dorsiflexion/Plantar flexion intact Incision: dressing C/D/I Compartment soft No hematoma  Lab Results  Component Value Date   WBC 9.2 01/28/2019   HGB 8.6 (L) 01/28/2019   HCT 27.8 (L) 01/28/2019   MCV 97.9 01/28/2019   PLT 147 (L) 01/28/2019   BMET    Component Value Date/Time   NA 135 01/28/2019 0531   K 4.1 01/28/2019 0531   CL 104 01/28/2019 0531   CO2 23 01/28/2019 0531   GLUCOSE 121 (H) 01/28/2019 0531   BUN 15 01/28/2019 0531   CREATININE 0.97 01/28/2019 0531   CALCIUM 8.4 (L) 01/28/2019 0531   GFRNONAA 58 (L) 01/28/2019 0531   GFRAA >60 01/28/2019 0531     Assessment/Plan: 1 Day Post-Op   Principal Problem:   Closed left hip fracture, initial encounter (HCC) Active Problems:   Tremors of nervous system   GERD (gastroesophageal reflux disease)   Hyperlipidemia   Depression   Normocytic anemia   Syncope   TDWB LLE with walker DVT ppx: Lovenox, SCDs, TEDS PO pain control PT/OT Dispo: D/C planning   Jennifer Estrada 01/28/2019, 2:25 PM   Rod Can, MD [phone] [place] [facility] now [facility]  [place] [address], [place],  [address] Phone: [phone] [email or web address] Facebook  [employer]

## 2019-01-28 NOTE — Evaluation (Signed)
Physical Therapy Evaluation Patient Details Name: Jennifer Estrada MRN: [id number] DOB: [birth year] Today's Date: 01/28/2019   History of Present Illness  73 y.o. female with medical history significant of allergic rhinitis, unspecified CKD, depression, arthritis of the knee, GERD, hyperlipidemia, migraine headaches, tremors, osteoporosis, vitamin D deficiency who is coming to the emergency department from the surgical center of [place], where she was waiting to have elective eye surgery, after having an unwitnessed fall while she was trying to use the bathroom falling on her left side.  Pt s/p ORIF left femur and currently TDWB status  Clinical Impression  Patient is s/p above surgery resulting in functional limitations due to the deficits listed below (see PT Problem List).  Patient will benefit from skilled PT to increase their independence and safety with mobility to allow discharge to the venue listed below.  Pt requiring increased assist for bed mobility due to pain.  Pt unable to assist with standing using RW.  Pt and spouse educated on TDWB status.  Pt would benefit from SNF upon d/c.       Follow Up Recommendations SNF    Equipment Recommendations  None recommended by PT    Recommendations for Other Services       Precautions / Restrictions Precautions Precautions: Fall Restrictions Weight Bearing Restrictions: Yes LLE Weight Bearing: Touchdown weight bearing      Mobility  Bed Mobility Overal bed mobility: Needs Assistance Bed Mobility: Supine to Sit;Sit to Supine     Supine to sit: Max assist Sit to supine: Total assist;+2 for physical assistance   General bed mobility comments: provided increased time and cues for pt to self assist to EOB however pt requiring increased assist due to pain  Transfers Overall transfer level: Needs assistance Equipment used: Rolling walker (2 wheeled)   Sit to Stand: Total assist         General transfer comment: attempted to  stand x3 however pt unable to lift bottom from bed, max cues for TDWB status as well  Ambulation/Gait                Stairs            Wheelchair Mobility    Modified Rankin (Stroke Patients Only)       Balance Overall balance assessment: History of Falls                                           Pertinent Vitals/Pain Pain Assessment: Faces Faces Pain Scale: Hurts whole lot Pain Location: L leg Pain Descriptors / Indicators: Aching;Grimacing;Guarding;Sore Pain Intervention(s): Repositioned;Monitored during session;Premedicated before session    Home Living Family/patient expects to be discharged to:: Private residence Living Arrangements: Spouse/significant other   Type of Home: House       Home Layout: One level Home Equipment: Environmental consultant - 2 wheels;Walker - 4 wheels;Bedside commode      Prior Function Level of Independence: Independent               Hand Dominance        Extremity/Trunk Assessment   Upper Extremity Assessment Upper Extremity Assessment: Generalized weakness    Lower Extremity Assessment Lower Extremity Assessment: Generalized weakness;LLE deficits/detail LLE Deficits / Details: requiring assist LLE: Unable to fully assess due to pain       Communication   Communication: No difficulties  Cognition Arousal/Alertness: Awake/alert Behavior During  Therapy: WFL for tasks assessed/performed Overall Cognitive Status: Impaired/Different from baseline Area of Impairment: Following commands;Problem solving                       Following Commands: Follows one step commands with increased time     Problem Solving: Slow processing;Difficulty sequencing;Requires verbal cues General Comments: per RN, pt confused at times      General Comments      Exercises     Assessment/Plan    PT Assessment Patient needs continued PT services  PT Problem List Decreased strength;Decreased mobility;Decreased  activity tolerance;Decreased balance;Decreased knowledge of use of DME;Decreased cognition;Decreased knowledge of precautions       PT Treatment Interventions DME instruction;Gait training;Therapeutic activities;Stair training;Functional mobility training;Balance training;Therapeutic exercise;Patient/family education    PT Goals (Current goals can be found in the Care Plan section)  Acute Rehab PT Goals PT Goal Formulation: With patient/family Time For Goal Achievement: 02/11/19 Potential to Achieve Goals: Good    Frequency Min 3X/week   Barriers to discharge        Co-evaluation               AM-PAC PT "6 Clicks" Mobility  Outcome Measure Help needed turning from your back to your side while in a flat bed without using bedrails?: A Lot Help needed moving from lying on your back to sitting on the side of a flat bed without using bedrails?: A Lot Help needed moving to and from a bed to a chair (including a wheelchair)?: Total Help needed standing up from a chair using your arms (e.g., wheelchair or bedside chair)?: Total Help needed to walk in hospital room?: Total Help needed climbing 3-5 steps with a railing? : Total 6 Click Score: 8    End of Session Equipment Utilized During Treatment: Gait belt Activity Tolerance: Patient tolerated treatment well Patient left: in bed;with call bell/phone within reach;with bed alarm set Nurse Communication: Mobility status PT Visit Diagnosis: Other abnormalities of gait and mobility (R26.89)    Time: 4401-0272 PT Time Calculation (min) (ACUTE ONLY): 28 min   Charges:   PT Evaluation $PT Eval Low Complexity: 1 Low PT Treatments $Therapeutic Activity: 8-22 mins       Arlyce Dice, DPT Acute Rehabilitation Services Office: [phone]  Trena Platt 01/28/2019, 12:46 PM

## 2019-01-28 NOTE — TOC Initial Note (Signed)
Transition of Care [facility]) - Initial/Assessment Note    Patient Details  Name: Jennifer Estrada MRN: [id number] Date of Birth: December 12, 1945  [facility] CM/SW Contact:    Lanier Clam, RN Phone Number: 01/28/2019, 1:26 PM  Clinical Narrative:  Await PT/OT recc.                 Expected Discharge Plan: Skilled Nursing Facility Barriers to Discharge: Continued Medical Work up   Patient Goals and CMS Choice Patient states their goals for this hospitalization and ongoing recovery are:: go home      Expected Discharge Plan and Services Expected Discharge Plan: Skilled Nursing Facility   Discharge Planning Services: CM Consult Post Acute Care Choice: Skilled Nursing Facility Living arrangements for the past 2 months: Single Family Home                                      Prior Living Arrangements/Services Living arrangements for the past 2 months: Single Family Home Lives with:: Spouse Patient language and need for interpreter reviewed:: Yes Do you feel safe going back to the place where you live?: Yes      Need for Family Participation in Patient Care: No (Comment) Care giver support system in place?: Yes (comment)   Criminal Activity/Legal Involvement Pertinent to Current Situation/Hospitalization: No - Comment as needed  Activities of Daily Living Home Assistive Devices/Equipment: Cane (specify quad or straight), Bedside commode/3-in-1, Walker (specify type), Eyeglasses, Shower chair without back ADL Screening (condition at time of admission) Patient's cognitive ability adequate to safely complete daily activities?: Yes Is the patient deaf or have difficulty hearing?: No Does the patient have difficulty seeing, even when wearing glasses/contacts?: Yes Does the patient have difficulty concentrating, remembering, or making decisions?: No Patient able to express need for assistance with ADLs?: Yes Does the patient have difficulty dressing or  bathing?: Yes Independently performs ADLs?: No Communication: Independent Is this a change from baseline?: Pre-admission baseline Dressing (OT): Needs assistance, Dependent Is this a change from baseline?: Pre-admission baseline Grooming: Needs assistance Is this a change from baseline?: Pre-admission baseline Feeding: Independent Bathing: Needs assistance, Dependent Toileting: Needs assistance, Dependent Is this a change from baseline?: Pre-admission baseline In/Out Bed: Needs assistance, Dependent Is this a change from baseline?: Pre-admission baseline Walks in Home: Needs assistance, Dependent Is this a change from baseline?: Pre-admission baseline Does the patient have difficulty walking or climbing stairs?: Yes Weakness of Legs: Left Weakness of Arms/Hands: None  Permission Sought/Granted Permission sought to share information with : Case Manager Permission granted to share information with : Yes, Verbal Permission Granted              Emotional Assessment Appearance:: Appears stated age Attitude/Demeanor/Rapport: Gracious Affect (typically observed): Accepting Orientation: : Oriented to Self, Oriented to Place, Oriented to  Time, Oriented to Situation Alcohol / Substance Use: Not Applicable Psych Involvement: No (comment)  Admission diagnosis:  Fall [W19.XXXA] Closed left hip fracture (HCC) [S72.002A] Closed fracture of left hip, initial encounter (HCC) [S72.002A] Fall, initial encounter [W19.XXXA] Closed left hip fracture, initial encounter (HCC) [S72.002A] Laceration of left earlobe, initial encounter [S01.312A] Patient Active Problem List   Diagnosis Date Noted  . Closed left hip fracture, initial encounter (HCC) 01/26/2019  . Tremors of nervous system   . GERD (gastroesophageal reflux disease)   . Hyperlipidemia   . Depression   . Normocytic anemia   .  Syncope   . Pain in both knees 05/20/2017  . Patella fracture 02/12/2014  . Migraine syndrome  05/02/2012  . Anxiety attack 05/02/2012  . TIA (transient ischemic attack) 05/02/2012   PCP:  Lajean Manes, MD Pharmacy:   [place] ([place]), [place] - [address] [place] [address] Phone: [phone] Fax: [phone]     Social Determinants of Health (SDOH) Interventions    Readmission Risk Interventions No flowsheet data found.

## 2019-01-28 NOTE — Progress Notes (Signed)
Pt experienced intermittent periods of confusion during the night. Kept asking where her husband was and if she was in the hospital. After some conversation, nurse was able to reorient pt temporarily.

## 2019-01-29 ENCOUNTER — Encounter: Payer: Self-pay | Admitting: *Deleted

## 2019-01-29 LAB — BASIC METABOLIC PANEL
Anion gap: 6 (ref 5–15)
BUN: 15 mg/dL (ref 8–23)
CO2: 24 mmol/L (ref 22–32)
Calcium: 8.4 mg/dL — ABNORMAL LOW (ref 8.9–10.3)
Chloride: 107 mmol/L (ref 98–111)
Creatinine, Ser: 0.85 mg/dL (ref 0.44–1.00)
GFR calc Af Amer: >60 mL/min (ref >60)
GFR calc non Af Amer: >60 mL/min (ref >60)
Glucose, Bld: 104 mg/dL — ABNORMAL HIGH (ref 70–99)
Potassium: 3.9 mmol/L (ref 3.5–5.1)
Sodium: 137 mmol/L (ref 135–145)

## 2019-01-29 LAB — CBC
HCT: 26.8 % — ABNORMAL LOW (ref 36.0–46.0)
Hemoglobin: 8.3 g/dL — ABNORMAL LOW (ref 12.0–15.0)
MCH: 30.7 pg (ref 26.0–34.0)
MCHC: 31 g/dL (ref 30.0–36.0)
MCV: 99.3 fL (ref 80.0–100.0)
Platelets: 140 10*3/uL — ABNORMAL LOW (ref 150–400)
RBC: 2.7 MIL/uL — ABNORMAL LOW (ref 3.87–5.11)
RDW: 13.7 % (ref 11.5–15.5)
WBC: 10.1 10*3/uL (ref 4.0–10.5)
nRBC: 0 % (ref 0.0–0.2)

## 2019-01-29 NOTE — Progress Notes (Addendum)
PROGRESS NOTE    Jennifer Estrada  ZOX:[id number] DOB: 1945-05-14 DOA: 01/26/2019 PCP: Merlene LaughterStoneking, Hal, MD   Brief Narrative:   Jennifer Estrada is a 73 y.o. female with medical history significant of allergic rhinitis, unspecified CKD, depression, arthritis of the knee, GERD, hyperlipidemia, migraine headaches, basal tremors, osteoporosis, vitamin D deficiency who is coming to the emergency department from the surgical center of [place], where she was waiting to have elective eye surgery, after having an unwitnessed fall while she was trying to use the bathroom falling on her left side injuring her LLE and left ear.  The patient states that she had to go to the bathroom and felt that her stomach did not feel well after she had the Inderal on an empty stomach earlier in the morning.  She does not remember what happened before she fell and does not think that she passed out for a long period of time.  She denies chest pain, palpitations, dizziness, diaphoresis, PND, orthopnea or recent pitting edema of the lower extremities  left hip x-ray shows a mildly displaced and varus angulated intertrochanteric fracture of the proximal left femur.  There was no dislocation.  She has been admitted for further surgical management.   Assessment & Plan:   Principal Problem:   Closed left hip fracture, initial encounter (HCC) Active Problems:   Tremors of nervous system   GERD (gastroesophageal reflux disease)   Hyperlipidemia   Depression   Normocytic anemia   Syncope     Closed left hip fracture  ( Left reverse obliquity intertrochanteric femur fracture/subtrochanteric femur fracture.) , initial encounter (HCC) status Open reduction and intramedullary fixation of left femur.  Admit to telemetry/inpatient.  Analgesics as needed. Muscle relaxants as needed. Antiemetics as needed. PT/OT evaluation. Dietitian evaluation of nutritional needs.  Orthopedic surgeon Dr. Linna CapriceSwinteck following    Syncope Vasovagal vs/plus Inderal on empty stomach.  We will get orthostatic vital    Tremors of nervous system Continue Inderal 60 mg p.o. daily.    GERD (gastroesophageal reflux disease) Continue PPI.    Hyperlipidemia On Crestor 5 mg p.o. bedtime.    Depression Continue paroxetine 20 mg p.o. daily.    Normocytic anemia Monitor H&H.   AKI / CKD 3 -Baseline creatinine not known.  We will continue to follow. Check CK to rule out rhabdomyolysis.  Hypotension-resolved with IV fluids.  Patient takes propranolol chronically for migraine prophylaxis which could be the reason.  If persistently low, will consider discontinuing and switching to another agent.  We will check orthostatic vitals.  DVT prophylaxis: SCDs.  Lovenox Code Status: Full code. Family Communication:  Disposition Plan: Admit for pain control, orthopedic surgery evaluation and procedure Consults called: Orthopedic surgery (Dr. Linna CapriceSwinteck). Admission status: Inpatient/telemetry.  COVID-19 test ordered, SNF placement pending  Consultants:  Orthopedic Dr. Linna CapriceSwinteck  Procedures:   Antimicrobials:    Subjective:  Patient is resting comfortable.  She is more oriented today, to place person and time and situation..  She mentioned that she was in pain overnight because of constipation, however nursing staff mentioned that she was comfortable.  Also her last bowel movement was 1 day ago.  She currently denies any complaints.  Her pain is well controlled.  She is eating and drinking fine, no trouble passing urine or stool.   Objective: Vitals:   01/29/19 0218 01/29/19 0536 01/29/19 0747 01/29/19 1244  BP: (!) 72/62 (!) 89/46 100/66 (!) 100/48  Pulse: 88 79 84 92  Resp:  18  18  Temp: 98.2 F (36.8 C) 98 F (36.7 C)    TempSrc: Oral     SpO2: 93% 98%  98%  Weight:      Height:        Intake/Output Summary (Last 24 hours) at 01/29/2019 1735 Last data filed at 01/29/2019 0700 Gross per 24 hour   Intake -  Output 101 ml  Net -101 ml   Filed Weights   01/26/19 2200  Weight: 78 kg    Examination:  General exam: Appears calm and comfortable , in mild distress due to pain , oriented x3. Respiratory system: Clear to auscultation. Respiratory effort normal. Cardiovascular system: S1 & S2 heard, RRR. No JVD, murmurs, rubs, gallops or clicks. No pedal edema. Gastrointestinal system: Abdomen is nondistended, soft and nontender. No organomegaly or masses felt. Normal bowel sounds heard. Central nervous system: Alert and oriented. No focal neurological deficits. Extremities: Symmetric 5 x 5 power except for left leg . Skin: No rashes, lesions or ulcers Psychiatry: Judgement and insight appear normal. Mood & affect appropriate.    Left leg in surgical dressing, not externally rotated anymore, unable to lift due to pain, is able to wiggle toes.  Sensations intact.   Data Reviewed: I have personally reviewed following labs and imaging studies  CBC: Recent Labs  Lab 01/26/19 0931 01/27/19 0545 01/27/19 1454 01/28/19 0531 01/29/19 0517  WBC 7.1 8.4 14.8* 9.2 10.1  NEUTROABS 4.4  --   --   --   --   HGB 11.3* 11.0* 11.1* 8.6* 8.3*  HCT 36.4 34.8* 35.6* 27.8* 26.8*  MCV 97.6 98.0 99.7 97.9 99.3  PLT 185 194 198 147* 416*   Basic Metabolic Panel: Recent Labs  Lab 01/26/19 0931 01/27/19 0545 01/27/19 1454 01/28/19 0531 01/29/19 0517  NA 139 139  --  135 137  K 4.5 4.3  --  4.1 3.9  CL 105 106  --  104 107  CO2 23 26  --  23 24  GLUCOSE 92 107*  --  121* 104*  BUN 14 14  --  15 15  CREATININE 1.01* 0.99 0.91 0.97 0.85  CALCIUM 9.5 9.0  --  8.4* 8.4*   GFR: Estimated Creatinine Clearance: 61.2 mL/min (by C-G formula based on SCr of 0.85 mg/dL). Liver Function Tests: Recent Labs  Lab 01/27/19 0545  AST 19  ALT 19  ALKPHOS 47  BILITOT 1.4*  PROT 5.8*  ALBUMIN 3.1*   No results for input(s): LIPASE, AMYLASE in the last 168 hours. No results for input(s):  AMMONIA in the last 168 hours. Coagulation Profile: Recent Labs  Lab 01/26/19 0931  INR 1.0   Cardiac Enzymes: No results for input(s): CKTOTAL, CKMB, CKMBINDEX, TROPONINI in the last 168 hours. BNP (last 3 results) No results for input(s): PROBNP in the last 8760 hours. HbA1C: No results for input(s): HGBA1C in the last 72 hours. CBG: No results for input(s): GLUCAP in the last 168 hours. Lipid Profile: No results for input(s): CHOL, HDL, LDLCALC, TRIG, CHOLHDL, LDLDIRECT in the last 72 hours. Thyroid Function Tests: No results for input(s): TSH, T4TOTAL, FREET4, T3FREE, THYROIDAB in the last 72 hours. Anemia Panel: No results for input(s): VITAMINB12, FOLATE, FERRITIN, TIBC, IRON, RETICCTPCT in the last 72 hours. Sepsis Labs: No results for input(s): PROCALCITON, LATICACIDVEN in the last 168 hours.  Recent Results (from the past 240 hour(s))  Respiratory Panel by RT PCR (Flu A&B, Covid) - Nasopharyngeal Swab     Status: None   Collection Time:  01/26/19 11:44 AM   Specimen: Nasopharyngeal Swab  Result Value Ref Range Status   SARS Coronavirus 2 by RT PCR NEGATIVE NEGATIVE Final    Comment: (NOTE) SARS-CoV-2 target nucleic acids are NOT DETECTED. The SARS-CoV-2 RNA is generally detectable in upper respiratoy specimens during the acute phase of infection. The lowest concentration of SARS-CoV-2 viral copies this assay can detect is 131 copies/mL. A negative result does not preclude SARS-Cov-2 infection and should not be used as the sole basis for treatment or other patient management decisions. A negative result may occur with  improper specimen collection/handling, submission of specimen other than nasopharyngeal swab, presence of viral mutation(s) within the areas targeted by this assay, and inadequate number of viral copies (<131 copies/mL). A negative result must be combined with clinical observations, patient history, and epidemiological information. The expected result  is Negative. Fact Sheet for Patients:  https://www.moore.com/ Fact Sheet for Healthcare Providers:  https://www.young.biz/ This test is not yet ap proved or cleared by the [place] FDA and  has been authorized for detection and/or diagnosis of SARS-CoV-2 by FDA under an Emergency Use Authorization (EUA). This EUA will remain  in effect (meaning this test can be used) for the duration of the COVID-19 declaration under Section 564(b)(1) of the Act, 21 U.S.C. section 360bbb-3(b)(1), unless the authorization is terminated or revoked sooner.    Influenza A by PCR NEGATIVE NEGATIVE Final   Influenza B by PCR NEGATIVE NEGATIVE Final    Comment: (NOTE) The Xpert Xpress SARS-CoV-2/FLU/RSV assay is intended as an aid in  the diagnosis of influenza from Nasopharyngeal swab specimens and  should not be used as a sole basis for treatment. Nasal washings and  aspirates are unacceptable for Xpert Xpress SARS-CoV-2/FLU/RSV  testing. Fact Sheet for Patients: https://www.moore.com/ Fact Sheet for Healthcare Providers: https://www.young.biz/ This test is not yet approved or cleared by the [place] FDA and  has been authorized for detection and/or diagnosis of SARS-CoV-2 by  FDA under an Emergency Use Authorization (EUA). This EUA will remain  in effect (meaning this test can be used) for the duration of the  Covid-19 declaration under Section 564(b)(1) of the Act, 21  U.S.C. section 360bbb-3(b)(1), unless the authorization is  terminated or revoked. Performed at [facility], [address], [place], [place] [address]   Surgical PCR screen     Status: None   Collection Time: 01/27/19  1:08 AM   Specimen: Nasal Mucosa; Nasal Swab  Result Value Ref Range Status   MRSA, PCR NEGATIVE NEGATIVE Final   Staphylococcus aureus NEGATIVE NEGATIVE Final    Comment: (NOTE) The Xpert SA Assay (FDA  approved for NASAL specimens in patients 2 years of age and older), is one component of a comprehensive surveillance program. It is not intended to diagnose infection nor to guide or monitor treatment. Performed at [facility], [address], [place], [place] [address]          Radiology Studies: No results found.      Scheduled Meds: . docusate sodium  100 mg Oral BID  . enoxaparin (LOVENOX) injection  40 mg Subcutaneous Q24H  . loratadine  10 mg Oral Daily  . magnesium citrate  1 Bottle Oral Once  . mirabegron ER  50 mg Oral QHS  . mupirocin ointment  1 application Nasal BID  . ofloxacin  1 drop Right Eye QID  . pantoprazole  40 mg Oral Daily  . PARoxetine  20 mg Oral q morning -  10a  . prednisoLONE acetate  1 drop Left Eye BID  . propranolol ER  60 mg Oral Daily  . rosuvastatin  5 mg Oral QHS  . senna  1 tablet Oral BID  . Vitamin D (Ergocalciferol)  50,000 Units Oral Q7 days   Continuous Infusions: . methocarbamol (ROBAXIN) IV       LOS: 3 days    Time spent:     Latrice Storlie Hilbert Bible, MD [facility] Pager [phone]  If 7PM-7AM, please contact night-coverage [email or web address] Password [email or web address] 01/29/2019, 5:35 PM

## 2019-01-29 NOTE — TOC Progression Note (Signed)
Transition of Care [facility]) - Progression Note    Patient Details  Name: Jennifer Estrada MRN: [id number] Date of Birth: 11-30-1945  [facility] CM/SW Contact  Dalicia Kisner, Juliann Pulse, RN Phone Number: 01/29/2019, 1:51 PM  Clinical Narrative:        Expected Discharge Plan: [place] Barriers to Discharge: Continued Medical Work up  Expected Discharge Plan and Services Expected Discharge Plan: [place]   Discharge Planning Services: CM Consult Post Acute Care Choice: [place] Living arrangements for the past 2 months: Single Family Home                                       Social Determinants of Health (SDOH) Interventions    Readmission Risk Interventions No flowsheet data found.

## 2019-01-29 NOTE — Care Management Important Message (Signed)
Important Message  Patient Details IM Letter given to Dessa Phi RN Case Manager to present to the Patient Name: Jennifer Estrada MRN: [id number] Date of Birth: 08-21-1945   Medicare Important Message Given:  Yes     Kerin Salen 01/29/2019, 11:21 AM

## 2019-01-29 NOTE — Progress Notes (Signed)
    Subjective:  Patient reports pain as mild to moderate.  Denies N/V/CP/SOB.   Objective:   VITALS:   Vitals:   01/28/19 2041 01/29/19 0218 01/29/19 0536 01/29/19 0747  BP: (!) 84/72 (!) 72/62 (!) 89/46 100/66  Pulse: 89 88 79 84  Resp: 18  18   Temp: 98.1 F (36.7 C) 98.2 F (36.8 C) 98 F (36.7 C)   TempSrc:  Oral    SpO2: 95% 93% 98%   Weight:      Height:        NAD ABD soft Sensation intact distally Intact pulses distally Dorsiflexion/Plantar flexion intact Incision: dressing C/D/I Compartment soft No hematoma  Lab Results  Component Value Date   WBC 10.1 01/29/2019   HGB 8.3 (L) 01/29/2019   HCT 26.8 (L) 01/29/2019   MCV 99.3 01/29/2019   PLT 140 (L) 01/29/2019   BMET    Component Value Date/Time   NA 137 01/29/2019 0517   K 3.9 01/29/2019 0517   CL 107 01/29/2019 0517   CO2 24 01/29/2019 0517   GLUCOSE 104 (H) 01/29/2019 0517   BUN 15 01/29/2019 0517   CREATININE 0.85 01/29/2019 0517   CALCIUM 8.4 (L) 01/29/2019 0517   GFRNONAA >60 01/29/2019 0517   GFRAA >60 01/29/2019 0517     Assessment/Plan: 2 Days Post-Op   Principal Problem:   Closed left hip fracture, initial encounter (HCC) Active Problems:   Tremors of nervous system   GERD (gastroesophageal reflux disease)   Hyperlipidemia   Depression   Normocytic anemia   Syncope   TDWB LLE with walker DVT ppx: Lovenox, SCDs, TEDS PO pain control PT/OT Dispo: D/C planning   Jennifer Estrada 01/29/2019, 12:20 PM   Rod Can, MD [phone] [facility] is now [facility]  [facility] [place] [address], [address], [place],  [address] Phone: [phone] [email or web address] Facebook  [employer]

## 2019-01-29 NOTE — TOC Progression Note (Signed)
Transition of Care [facility]) - Progression Note    Patient Details  Name: [name] MRN: [id number] Date of Birth: 03-11-1945  Transition of Care [facility]) CM/SW Contact  Braulio Kiedrowski, Juliann Pulse, RN Phone Number: 01/29/2019, 1:52 PM  Clinical Narrative:  Left vm w/spouse on phone to inform of bed offers per patient request, & left bed offers in rm for [facility] per patient request.     Expected Discharge Plan: [place] Barriers to Discharge: Continued Medical Work up  Expected Discharge Plan and Services Expected Discharge Plan: [place]   Discharge Planning Services: CM Consult Post Acute Care Choice: [place] arrangements for the past 2 months: Single Family Home                                       Social Determinants of Health (SDOH) Interventions    Readmission Risk Interventions No flowsheet data found.

## 2019-01-30 LAB — CBC
HCT: 24.3 % — ABNORMAL LOW (ref 36.0–46.0)
Hemoglobin: 7.5 g/dL — ABNORMAL LOW (ref 12.0–15.0)
MCH: 30.5 pg (ref 26.0–34.0)
MCHC: 30.9 g/dL (ref 30.0–36.0)
MCV: 98.8 fL (ref 80.0–100.0)
Platelets: 147 10*3/uL — ABNORMAL LOW (ref 150–400)
RBC: 2.46 MIL/uL — ABNORMAL LOW (ref 3.87–5.11)
RDW: 13.6 % (ref 11.5–15.5)
WBC: 6.9 10*3/uL (ref 4.0–10.5)
nRBC: 0 % (ref 0.0–0.2)

## 2019-01-30 LAB — BASIC METABOLIC PANEL
Anion gap: 7 (ref 5–15)
BUN: 13 mg/dL (ref 8–23)
CO2: 23 mmol/L (ref 22–32)
Calcium: 8.4 mg/dL — ABNORMAL LOW (ref 8.9–10.3)
Chloride: 106 mmol/L (ref 98–111)
Creatinine, Ser: 0.77 mg/dL (ref 0.44–1.00)
GFR calc Af Amer: >60 mL/min (ref >60)
GFR calc non Af Amer: >60 mL/min (ref >60)
Glucose, Bld: 95 mg/dL (ref 70–99)
Potassium: 3.7 mmol/L (ref 3.5–5.1)
Sodium: 136 mmol/L (ref 135–145)

## 2019-01-30 MED ORDER — SODIUM CHLORIDE 0.9 % IV SOLN: INTRAVENOUS | Status: DC

## 2019-01-30 NOTE — Progress Notes (Addendum)
Subjective: 3 Days Post-Op Procedure(s) (LRB): INTRAMEDULLARY (IM) NAIL INTERTROCHANTRIC (Left)  Patient seen in rounds this morning for Dr. Lyla Glassing Patient reports pain as moderate.   Patient is doing okay this morning, is complaining of some pain in her hip and groin area.  Denies any pain in the knee and/or calf and ankle Denies any events overnight Does not complain of any chest pain, shortness of breath, nausea vomiting Patient was able to void, positive flatus  Objective: Vital signs in last 24 hours: Temp:  [98.4 F (36.9 C)] 98.4 F (36.9 C) (12/18 2039) Pulse Rate:  [92-95] 95 (12/18 2039) Resp:  [18] 18 (12/18 2039) BP: (100-103)/(48-55) 103/55 (12/18 2039) SpO2:  [98 %] 98 % (12/18 2039)  Intake/Output from previous day: 12/18 0701 - 12/19 0700 In: 120 [P.O.:120] Out: -  Intake/Output this shift: No intake/output data recorded.  Recent Labs    01/27/19 1454 01/28/19 0531 01/29/19 0517 01/30/19 0559  HGB 11.1* 8.6* 8.3* 7.5*   Recent Labs    01/29/19 0517 01/30/19 0559  WBC 10.1 6.9  RBC 2.70* 2.46*  HCT 26.8* 24.3*  PLT 140* 147*   Recent Labs    01/29/19 0517 01/30/19 0559  NA 137 136  K 3.9 3.7  CL 107 106  CO2 24 23  BUN 15 13  CREATININE 0.85 0.77  GLUCOSE 104* 95  CALCIUM 8.4* 8.4*   No results for input(s): LABPT, INR in the last 72 hours.  Neurologically intact ABD soft Neurovascular intact Sensation intact distally Intact pulses distally Dorsiflexion/Plantar flexion intact Incision: dressing C/D/I Compartment soft   Assessment/Plan: 3 Days Post-Op Procedure(s) (LRB): INTRAMEDULLARY (IM) NAIL INTERTROCHANTRIC (Left) Advance diet Up with therapy D/C IV fluids Discharge to SNF, currently just waiting on SNF placement In terms of orthopedic standpoint patient doing well, follow-up in the office with Dr. Lyla Glassing DVT prophylaxis, Lovenox Leave dressings on dry and intact until follow-up appointment Touchdown  weightbearing   Drue Novel, PA-C Orthopedic surgery [phone] 01/30/2019, 9:59 AM

## 2019-01-30 NOTE — Progress Notes (Signed)
PROGRESS NOTE    Jennifer Estrada  FAO:[id number] DOB: Jun 22, 1945 DOA: 01/26/2019 PCP: Merlene Laughter, MD   Brief Narrative:   Jennifer Estrada is a 73 y.o. female with medical history significant of allergic rhinitis, unspecified CKD, depression, arthritis of the knee, GERD, hyperlipidemia, migraine headaches, basal tremors, osteoporosis, vitamin D deficiency who is coming to the emergency department from the surgical center of [place], where she was waiting to have elective eye surgery, after having an unwitnessed fall while she was trying to use the bathroom falling on her left side injuring her LLE and left ear.  The patient states that she had to go to the bathroom and felt that her stomach did not feel well after she had the Inderal on an empty stomach earlier in the morning.  She does not remember what happened before she fell and does not think that she passed out for a long period of time.  She denies chest pain, palpitations, dizziness, diaphoresis, PND, orthopnea or recent pitting edema of the lower extremities  left hip x-ray shows a mildly displaced and varus angulated intertrochanteric fracture of the proximal left femur.  There was no dislocation.  She has been admitted for further surgical management.  01/30/2019: Patient seen alongside patient's nurse.  Patient has undergone left hip surgery.  AKI has resolved.  No reported syncope.  Patient is not able to give any significant history.  Patient is actually shaky at the moment, and may be lethargic, probably secondary to mind altering medications.  Will minimize mind altering medications.  Also discussed this with patient's nurse.  Assessment & Plan:   Principal Problem:   Closed left hip fracture, initial encounter (HCC) Active Problems:   Tremors of nervous system   GERD (gastroesophageal reflux disease)   Hyperlipidemia   Depression   Normocytic anemia   Syncope     Closed left hip fracture  ( Left reverse obliquity  intertrochanteric femur fracture/subtrochanteric femur fracture.) , initial encounter (HCC) status Open reduction and intramedullary fixation of left femur.  Admit to telemetry/inpatient.  Analgesics as needed. Muscle relaxants as needed. Antiemetics as needed. PT/OT evaluation. Dietitian evaluation of nutritional needs.  Orthopedic surgeon Dr. Linna Caprice following 01/30/2019: Pursue disposition when a bed is available.     Syncope Vasovagal vs/plus Inderal on empty stomach.  We will get orthostatic vital 01/30/2019: No reported syncope.    Tremors of nervous system Continue Inderal 60 mg p.o. daily.    GERD (gastroesophageal reflux disease) Continue PPI.    Hyperlipidemia On Crestor 5 mg p.o. bedtime.    Depression Continue paroxetine 20 mg p.o. daily.    Normocytic anemia Monitor H&H.   AKI -Resolved.  Hypotension-resolved with IV fluids.  Patient takes propranolol chronically for migraine prophylaxis which could be the reason.  If persistently low, will consider discontinuing and switching to another agent.  DVT prophylaxis: SCDs.  Lovenox Code Status: Full code. Family Communication:  Disposition Plan: Admit for pain control, orthopedic surgery evaluation and procedure Consults called: Orthopedic surgery (Dr. Linna Caprice). Admission status: Inpatient/telemetry.  COVID-19 test ordered, SNF placement pending  Consultants:  Orthopedic Dr. Linna Caprice  Procedures:   Antimicrobials:    Subjective: No significant history from patient.  Objective: Vitals:   01/29/19 0536 01/29/19 0747 01/29/19 1244 01/29/19 2039  BP: (!) 89/46 100/66 (!) 100/48 (!) 103/55  Pulse: 79 84 92 95  Resp: Temp: 98 F (36.7 C)   98.4 F (36.9 C)  TempSrc:  Oral  SpO2: 98%  98% 98%  Weight:      Height:        Intake/Output Summary (Last 24 hours) at 01/30/2019 0934 Last data filed at 01/29/2019 1815 Gross per 24 hour  Intake 120 ml  Output --  Net  120 ml   Filed Weights   01/26/19 2200  Weight: 78 kg    Examination: General exam: Shaky.  Possibly lethargic.   Respiratory system: Clear to auscultation. Respiratory effort normal. Cardiovascular system: S1 & S2 heard Gastrointestinal system: Abdomen is nondistended, soft and nontender. No organomegaly or masses felt. Normal bowel sounds heard. Central nervous system: Query lethargic.   Extremities: No leg edema.   Data Reviewed: I have personally reviewed following labs and imaging studies  CBC: Recent Labs  Lab 01/26/19 0931 01/27/19 0545 01/27/19 1454 01/28/19 0531 01/29/19 0517 01/30/19 0559  WBC 7.1 8.4 14.8* 9.2 10.1 6.9  NEUTROABS 4.4  --   --   --   --   --   HGB 11.3* 11.0* 11.1* 8.6* 8.3* 7.5*  HCT 36.4 34.8* 35.6* 27.8* 26.8* 24.3*  MCV 97.6 98.0 99.7 97.9 99.3 98.8  PLT 185 194 198 147* 140* 160*   Basic Metabolic Panel: Recent Labs  Lab 01/26/19 0931 01/27/19 0545 01/27/19 1454 01/28/19 0531 01/29/19 0517 01/30/19 0559  NA 139 139  --  135 137 136  K 4.5 4.3  --  4.1 3.9 3.7  CL 105 106  --  104 107 106  CO2 23 26  --  23 24 23   GLUCOSE 92 107*  --  121* 104* 95  BUN 14 14  --  15 15 13   CREATININE 1.01* 0.99 0.91 0.97 0.85 0.77  CALCIUM 9.5 9.0  --  8.4* 8.4* 8.4*   GFR: Estimated Creatinine Clearance: 65.1 mL/min (by C-G formula based on SCr of 0.77 mg/dL). Liver Function Tests: Recent Labs  Lab 01/27/19 0545  AST 19  ALT 19  ALKPHOS 47  BILITOT 1.4*  PROT 5.8*  ALBUMIN 3.1*   No results for input(s): LIPASE, AMYLASE in the last 168 hours. No results for input(s): AMMONIA in the last 168 hours. Coagulation Profile: Recent Labs  Lab 01/26/19 0931  INR 1.0   Cardiac Enzymes: No results for input(s): CKTOTAL, CKMB, CKMBINDEX, TROPONINI in the last 168 hours. BNP (last 3 results) No results for input(s): PROBNP in the last 8760 hours. HbA1C: No results for input(s): HGBA1C in the last 72 hours. CBG: No results for input(s):  GLUCAP in the last 168 hours. Lipid Profile: No results for input(s): CHOL, HDL, LDLCALC, TRIG, CHOLHDL, LDLDIRECT in the last 72 hours. Thyroid Function Tests: No results for input(s): TSH, T4TOTAL, FREET4, T3FREE, THYROIDAB in the last 72 hours. Anemia Panel: No results for input(s): VITAMINB12, FOLATE, FERRITIN, TIBC, IRON, RETICCTPCT in the last 72 hours. Sepsis Labs: No results for input(s): PROCALCITON, LATICACIDVEN in the last 168 hours.  Recent Results (from the past 240 hour(s))  Respiratory Panel by RT PCR (Flu A&B, Covid) - Nasopharyngeal Swab     Status: None   Collection Time: 01/26/19 11:44 AM   Specimen: Nasopharyngeal Swab  Result Value Ref Range Status   SARS Coronavirus 2 by RT PCR NEGATIVE NEGATIVE Final    Comment: (NOTE) SARS-CoV-2 target nucleic acids are NOT DETECTED. The SARS-CoV-2 RNA is generally detectable in upper respiratoy specimens during the acute phase of infection. The lowest concentration of SARS-CoV-2 viral copies this assay can detect is 131 copies/mL. A  negative result does not preclude SARS-Cov-2 infection and should not be used as the sole basis for treatment or other patient management decisions. A negative result may occur with  improper specimen collection/handling, submission of specimen other than nasopharyngeal swab, presence of viral mutation(s) within the areas targeted by this assay, and inadequate number of viral copies (<131 copies/mL). A negative result must be combined with clinical observations, patient history, and epidemiological information. The expected result is Negative. Fact Sheet for Patients:  https://www.moore.com/ Fact Sheet for Healthcare Providers:  https://www.young.biz/ This test is not yet ap proved or cleared by the [place] FDA and  has been authorized for detection and/or diagnosis of SARS-CoV-2 by FDA under an Emergency Use Authorization (EUA). This EUA will remain    in effect (meaning this test can be used) for the duration of the COVID-19 declaration under Section 564(b)(1) of the Act, 21 U.S.C. section 360bbb-3(b)(1), unless the authorization is terminated or revoked sooner.    Influenza A by PCR NEGATIVE NEGATIVE Final   Influenza B by PCR NEGATIVE NEGATIVE Final    Comment: (NOTE) The Xpert Xpress SARS-CoV-2/FLU/RSV assay is intended as an aid in  the diagnosis of influenza from Nasopharyngeal swab specimens and  should not be used as a sole basis for treatment. Nasal washings and  aspirates are unacceptable for Xpert Xpress SARS-CoV-2/FLU/RSV  testing. Fact Sheet for Patients: https://www.moore.com/ Fact Sheet for Healthcare Providers: https://www.young.biz/ This test is not yet approved or cleared by the [place] FDA and  has been authorized for detection and/or diagnosis of SARS-CoV-2 by  FDA under an Emergency Use Authorization (EUA). This EUA will remain  in effect (meaning this test can be used) for the duration of the  Covid-19 declaration under Section 564(b)(1) of the Act, 21  U.S.C. section 360bbb-3(b)(1), unless the authorization is  terminated or revoked. Performed at [facility], [address] [place], [place] [address]   Surgical PCR screen     Status: None   Collection Time: 01/27/19  1:08 AM   Specimen: Nasal Mucosa; Nasal Swab  Result Value Ref Range Status   MRSA, PCR NEGATIVE NEGATIVE Final   Staphylococcus aureus NEGATIVE NEGATIVE Final    Comment: (NOTE) The Xpert SA Assay (FDA approved for NASAL specimens in patients 14 years of age and older), is one component of a comprehensive surveillance program. It is not intended to diagnose infection nor to guide or monitor treatment. Performed at [facility], [address] [place], [place] [address]          Radiology Studies: No results found.      Scheduled Meds: .  docusate sodium  100 mg Oral BID  . enoxaparin (LOVENOX) injection  40 mg Subcutaneous Q24H  . loratadine  10 mg Oral Daily  . magnesium citrate  1 Bottle Oral Once  . mirabegron ER  50 mg Oral QHS  . mupirocin ointment  1 application Nasal BID  . ofloxacin  1 drop Right Eye QID  . pantoprazole  40 mg Oral Daily  . PARoxetine  20 mg Oral q morning - 10a  . prednisoLONE acetate  1 drop Left Eye BID  . propranolol ER  60 mg Oral Daily  . rosuvastatin  5 mg Oral QHS  . senna  1 tablet Oral BID  . Vitamin D (Ergocalciferol)  50,000 Units Oral Q7 days   Continuous Infusions: . methocarbamol (ROBAXIN) IV       LOS: 4 days  Time spent:     Barnetta ChapelSylvester I Alaster Asfaw, MD [facility] Pager [phone]  If 7PM-7AM, please contact night-coverage [email or web address] Password [email or web address] 01/30/2019, 9:34 AM

## 2019-01-31 LAB — PREPARE RBC (CROSSMATCH)

## 2019-01-31 LAB — CBC
HCT: 22.2 % — ABNORMAL LOW (ref 36.0–46.0)
Hemoglobin: 6.8 g/dL — CL (ref 12.0–15.0)
MCH: 30.1 pg (ref 26.0–34.0)
MCHC: 30.6 g/dL (ref 30.0–36.0)
MCV: 98.2 fL (ref 80.0–100.0)
Platelets: 185 10*3/uL (ref 150–400)
RBC: 2.26 MIL/uL — ABNORMAL LOW (ref 3.87–5.11)
RDW: 14 % (ref 11.5–15.5)
WBC: 6.6 10*3/uL (ref 4.0–10.5)
nRBC: 0 % (ref 0.0–0.2)

## 2019-01-31 LAB — BASIC METABOLIC PANEL
Anion gap: 6 (ref 5–15)
BUN: 12 mg/dL (ref 8–23)
CO2: 26 mmol/L (ref 22–32)
Calcium: 8.2 mg/dL — ABNORMAL LOW (ref 8.9–10.3)
Chloride: 106 mmol/L (ref 98–111)
Creatinine, Ser: 0.79 mg/dL (ref 0.44–1.00)
GFR calc Af Amer: >60 mL/min (ref >60)
GFR calc non Af Amer: >60 mL/min (ref >60)
Glucose, Bld: 98 mg/dL (ref 70–99)
Potassium: 3.7 mmol/L (ref 3.5–5.1)
Sodium: 138 mmol/L (ref 135–145)

## 2019-01-31 LAB — HEMOGLOBIN AND HEMATOCRIT, BLOOD
HCT: 26.5 % — ABNORMAL LOW (ref 36.0–46.0)
Hemoglobin: 8.6 g/dL — ABNORMAL LOW (ref 12.0–15.0)

## 2019-01-31 MED ORDER — SODIUM CHLORIDE 0.9% IV SOLUTION: Freq: Once | INTRAVENOUS | Status: DC

## 2019-01-31 MED ORDER — POLYETHYLENE GLYCOL 3350 17 G PO PACK
17.0000 g | PACK | Freq: Every day | ORAL | Status: DC
  Administered 2019-01-31 – 2019-02-02 (×3): 17 g via ORAL
  Filled 2019-01-31 (×3): qty 1

## 2019-01-31 NOTE — Plan of Care (Signed)
  Problem: Education: Goal: Knowledge of General Education information will improve Description: Including pain rating scale, medication(s)/side effects and non-pharmacologic comfort measures Outcome: Progressing   Problem: Clinical Measurements: Goal: Diagnostic test results will improve Outcome: Progressing Goal: Respiratory complications will improve Outcome: Progressing Goal: Cardiovascular complication will be avoided Outcome: Progressing   Problem: Nutrition: Goal: Adequate nutrition will be maintained Outcome: Progressing   

## 2019-01-31 NOTE — TOC Progression Note (Signed)
Transition of Care [facility]) - Progression Note    Patient Details  Name: PAISLEI DORVAL MRN: [id number] Date of Birth: 03-06-1945  Transition of Care [facility]) CM/SW Contact  Servando Snare, [place] Phone Number: 01/31/2019, 10:52 AM  Clinical Narrative:   Patietn and spouse chose [place]. Patient will need UHC auth that can be started on Monday.     Expected Discharge Plan: [place] Barriers to Discharge: Continued Medical Work up  Expected Discharge Plan and Services Expected Discharge Plan: [place]   Discharge Planning Services: CM Consult Post Acute Care Choice: [place] Living arrangements for the past 2 months: Single Family Home                                       Social Determinants of Health (SDOH) Interventions    Readmission Risk Interventions No flowsheet data found.

## 2019-01-31 NOTE — Progress Notes (Signed)
CRITICAL VALUE ALERT  Critical Value: Hgb 6.8  Date & Time Notied: 01/31/19 @ 0647  Provider Notified: X. Blount NP   Orders Received/Actions taken: Order to transfuse 1 unit PRBC received

## 2019-01-31 NOTE — Progress Notes (Signed)
PROGRESS NOTE    Jennifer Estrada  ZOX:[id number] DOB: 10-25-1945 DOA: 01/26/2019 PCP: Merlene LaughterStoneking, Hal, MD   Brief Narrative:  Patient is a 73 year old Caucasian female with past medical history significant for allergic rhinitis, unspecified CKD, depression, arthritis of the knee, GERD, hyperlipidemia, migraine headaches, basal tremors, osteoporosis and vitamin D deficiency.  Patient was admitted with left hip fracture following a fall.    01/30/2019: Patient seen alongside patient's nurse.  Patient has undergone left hip surgery.  AKI has resolved.  No reported syncope.  Patient is not able to give any significant history.  Patient is actually shaky at the moment, and may be lethargic, probably secondary to mind altering medications.  Will minimize mind altering medications.  Also discussed this with patient's nurse.   01/31/2019: Patient seen alongside patient's husband.  Patient is currently being transfused with packed red blood cells.  Hemoglobin done earlier this morning was 6.8 g/dL.  Patient is normal awake and alert today, quite communicative.  Patient explained the shaking that was observed yesterday to be secondary to anxiety and cold feeling.  Patient is awaiting disposition.  Patient reported constipation, otherwise, no other constitutional symptoms reported.  Assessment & Plan:   Principal Problem:   Closed left hip fracture, initial encounter (HCC) Active Problems:   Tremors of nervous system   GERD (gastroesophageal reflux disease)   Hyperlipidemia   Depression   Normocytic anemia   Syncope     Closed left hip fracture  ( Left reverse obliquity intertrochanteric femur fracture/subtrochanteric femur fracture.) , initial encounter (HCC) status Open reduction and intramedullary fixation of left femur. -Procedure disposition when a bed is available.  Plan is to discharge patient to skilled nursing facility.   -Analgesics as needed. -Muscle relaxants as needed. -Physical  therapy and Occupational Therapy input appreciated.  Orthopedic surgery team is following.  Anemia: -Normal MCV -Baseline Hemoglobin of 11.1g/dl -Hemoglobin this am was 6.8 g/dl, suspect likely to surgery -Patient is currently being transfused 1 unit PRBC -Continue to monitor closely.  Syncope: -No further syncope reported.   Tremors of nervous system: Continue Inderal 60 mg p.o. daily.  GERD (gastroesophageal reflux disease): Continue PPI.  Hyperlipidemia: On Crestor 5 mg p.o. bedtime.  Depression: Continue paroxetine 20 mg p.o. daily.  AKI: -Resolved.  Hypotension-resolved with IV fluids.  Patient takes propranolol chronically for migraine prophylaxis which could be the reason.  If persistently low, will consider discontinuing and switching to another agent.  DVT prophylaxis: SCDs.  Lovenox Code Status: Full code. Family Communication:  Disposition Plan: Admit for pain control, orthopedic surgery evaluation and procedure Consults called: Orthopedic surgery (Dr. Linna CapriceSwinteck). Admission status: Inpatient/telemetry.  COVID-19 test ordered, SNF placement pending  Consultants:  Orthopedic Dr. Linna CapriceSwinteck  Procedures:   Antimicrobials:    Subjective: Reported constipation.  Objective: Vitals:   01/30/19 2028 01/31/19 0433 01/31/19 1045 01/31/19 1104  BP: (!) 106/55 (!) 100/55 (!) 103/59 106/60  Pulse: 93 82 95 95  Resp: 18 18 18 18   Temp: 97.9 F (36.6 C) 97.9 F (36.6 C) 98.2 F (36.8 C) 98.2 F (36.8 C)  TempSrc: Oral Oral Oral Oral  SpO2: 99% 96% 98% 98%  Weight:      Height:        Intake/Output Summary (Last 24 hours) at 01/31/2019 1135 Last data filed at 01/31/2019 1044 Gross per 24 hour  Intake 1014.64 ml  Output 700 ml  Net 314.64 ml   Filed Weights   01/26/19 2200  Weight: 78 kg  Examination: General exam: Awake and alert. Patient is not in any distress. Respiratory system: Clear to auscultation. Respiratory effort  normal. Cardiovascular system: S1 & S2 heard Gastrointestinal system: Abdomen is nondistended, soft and nontender. No organomegaly or masses felt. Normal bowel sounds heard. Central nervous system: AAO X 3. Patient moves all extremities.  Extremities: No leg edema.   Data Reviewed: I have personally reviewed following labs and imaging studies  CBC: Recent Labs  Lab 01/26/19 0931 01/27/19 1454 01/28/19 0531 01/29/19 0517 01/30/19 0559 01/31/19 0515  WBC 7.1 14.8* 9.2 10.1 6.9 6.6  NEUTROABS 4.4  --   --   --   --   --   HGB 11.3* 11.1* 8.6* 8.3* 7.5* 6.8*  HCT 36.4 35.6* 27.8* 26.8* 24.3* 22.2*  MCV 97.6 99.7 97.9 99.3 98.8 98.2  PLT 185 198 147* 140* 147* 270   Basic Metabolic Panel: Recent Labs  Lab 01/27/19 0545 01/27/19 1454 01/28/19 0531 01/29/19 0517 01/30/19 0559 01/31/19 0515  NA 139  --  135 137 136 138  K 4.3  --  4.1 3.9 3.7 3.7  CL 106  --  104 107 106 106  CO2 26  --  23 24 23 26   GLUCOSE 107*  --  121* 104* 95 98  BUN 14  --  15 15 13 12   CREATININE 0.99 0.91 0.97 0.85 0.77 0.79  CALCIUM 9.0  --  8.4* 8.4* 8.4* 8.2*   GFR: Estimated Creatinine Clearance: 65.1 mL/min (by C-G formula based on SCr of 0.79 mg/dL). Liver Function Tests: Recent Labs  Lab 01/27/19 0545  AST 19  ALT 19  ALKPHOS 47  BILITOT 1.4*  PROT 5.8*  ALBUMIN 3.1*   No results for input(s): LIPASE, AMYLASE in the last 168 hours. No results for input(s): AMMONIA in the last 168 hours. Coagulation Profile: Recent Labs  Lab 01/26/19 0931  INR 1.0   Cardiac Enzymes: No results for input(s): CKTOTAL, CKMB, CKMBINDEX, TROPONINI in the last 168 hours. BNP (last 3 results) No results for input(s): PROBNP in the last 8760 hours. HbA1C: No results for input(s): HGBA1C in the last 72 hours. CBG: No results for input(s): GLUCAP in the last 168 hours. Lipid Profile: No results for input(s): CHOL, HDL, LDLCALC, TRIG, CHOLHDL, LDLDIRECT in the last 72 hours. Thyroid Function  Tests: No results for input(s): TSH, T4TOTAL, FREET4, T3FREE, THYROIDAB in the last 72 hours. Anemia Panel: No results for input(s): VITAMINB12, FOLATE, FERRITIN, TIBC, IRON, RETICCTPCT in the last 72 hours. Sepsis Labs: No results for input(s): PROCALCITON, LATICACIDVEN in the last 168 hours.  Recent Results (from the past 240 hour(s))  Respiratory Panel by RT PCR (Flu A&B, Covid) - Nasopharyngeal Swab     Status: None   Collection Time: 01/26/19 11:44 AM   Specimen: Nasopharyngeal Swab  Result Value Ref Range Status   SARS Coronavirus 2 by RT PCR NEGATIVE NEGATIVE Final    Comment: (NOTE) SARS-CoV-2 target nucleic acids are NOT DETECTED. The SARS-CoV-2 RNA is generally detectable in upper respiratoy specimens during the acute phase of infection. The lowest concentration of SARS-CoV-2 viral copies this assay can detect is 131 copies/mL. A negative result does not preclude SARS-Cov-2 infection and should not be used as the sole basis for treatment or other patient management decisions. A negative result may occur with  improper specimen collection/handling, submission of specimen other than nasopharyngeal swab, presence of viral mutation(s) within the areas targeted by this assay, and inadequate number of viral copies (<131 copies/mL).  A negative result must be combined with clinical observations, patient history, and epidemiological information. The expected result is Negative. Fact Sheet for Patients:  https://www.moore.com/ Fact Sheet for Healthcare Providers:  https://www.young.biz/ This test is not yet ap proved or cleared by the [place] FDA and  has been authorized for detection and/or diagnosis of SARS-CoV-2 by FDA under an Emergency Use Authorization (EUA). This EUA will remain  in effect (meaning this test can be used) for the duration of the COVID-19 declaration under Section 564(b)(1) of the Act, 21 U.S.C. section  360bbb-3(b)(1), unless the authorization is terminated or revoked sooner.    Influenza A by PCR NEGATIVE NEGATIVE Final   Influenza B by PCR NEGATIVE NEGATIVE Final    Comment: (NOTE) The Xpert Xpress SARS-CoV-2/FLU/RSV assay is intended as an aid in  the diagnosis of influenza from Nasopharyngeal swab specimens and  should not be used as a sole basis for treatment. Nasal washings and  aspirates are unacceptable for Xpert Xpress SARS-CoV-2/FLU/RSV  testing. Fact Sheet for Patients: https://www.moore.com/ Fact Sheet for Healthcare Providers: https://www.young.biz/ This test is not yet approved or cleared by the [place] FDA and  has been authorized for detection and/or diagnosis of SARS-CoV-2 by  FDA under an Emergency Use Authorization (EUA). This EUA will remain  in effect (meaning this test can be used) for the duration of the  Covid-19 declaration under Section 564(b)(1) of the Act, 21  U.S.C. section 360bbb-3(b)(1), unless the authorization is  terminated or revoked. Performed at [facility], [address] [place], [place] [address]   Surgical PCR screen     Status: None   Collection Time: 01/27/19  1:08 AM   Specimen: Nasal Mucosa; Nasal Swab  Result Value Ref Range Status   MRSA, PCR NEGATIVE NEGATIVE Final   Staphylococcus aureus NEGATIVE NEGATIVE Final    Comment: (NOTE) The Xpert SA Assay (FDA approved for NASAL specimens in patients 66 years of age and older), is one component of a comprehensive surveillance program. It is not intended to diagnose infection nor to guide or monitor treatment. Performed at [facility], [address] [place], [place] [address]      Radiology Studies: No results found.  Scheduled Meds: . sodium chloride   Intravenous Once  . docusate sodium  100 mg Oral BID  . enoxaparin (LOVENOX) injection  40 mg Subcutaneous Q24H  . loratadine  10 mg Oral  Daily  . magnesium citrate  1 Bottle Oral Once  . mirabegron ER  50 mg Oral QHS  . mupirocin ointment  1 application Nasal BID  . ofloxacin  1 drop Right Eye QID  . pantoprazole  40 mg Oral Daily  . PARoxetine  20 mg Oral q morning - 10a  . polyethylene glycol  17 g Oral Daily  . prednisoLONE acetate  1 drop Left Eye BID  . propranolol ER  60 mg Oral Daily  . rosuvastatin  5 mg Oral QHS  . senna  1 tablet Oral BID  . Vitamin D (Ergocalciferol)  50,000 Units Oral Q7 days   Continuous Infusions: . sodium chloride 50 mL/hr at 01/31/19 0723  . methocarbamol (ROBAXIN) IV       LOS: 5 days   Time spent:   Barnetta Chapel, MD [facility]  If 7PM-7AM, please contact night-coverage [email or web address] Password [email or web address] 01/31/2019, 11:35 AM

## 2019-02-01 ENCOUNTER — Encounter (HOSPITAL_COMMUNITY): Payer: Self-pay | Admitting: Internal Medicine

## 2019-02-01 LAB — TYPE AND SCREEN
ABO/RH(D): O NEG
Antibody Screen: NEGATIVE
Unit division: 0

## 2019-02-01 LAB — BPAM RBC
Blood Product Expiration Date: 202101132359
ISSUE DATE / TIME: 202012201042
Unit Type and Rh: 9500

## 2019-02-01 LAB — BASIC METABOLIC PANEL
Anion gap: 6 (ref 5–15)
BUN: 10 mg/dL (ref 8–23)
CO2: 24 mmol/L (ref 22–32)
Calcium: 8.5 mg/dL — ABNORMAL LOW (ref 8.9–10.3)
Chloride: 108 mmol/L (ref 98–111)
Creatinine, Ser: 0.74 mg/dL (ref 0.44–1.00)
GFR calc Af Amer: >60 mL/min (ref >60)
GFR calc non Af Amer: >60 mL/min (ref >60)
Glucose, Bld: 98 mg/dL (ref 70–99)
Potassium: 3.7 mmol/L (ref 3.5–5.1)
Sodium: 138 mmol/L (ref 135–145)

## 2019-02-01 LAB — CBC
HCT: 27.3 % — ABNORMAL LOW (ref 36.0–46.0)
Hemoglobin: 8.5 g/dL — ABNORMAL LOW (ref 12.0–15.0)
MCH: 30.1 pg (ref 26.0–34.0)
MCHC: 31.1 g/dL (ref 30.0–36.0)
MCV: 96.8 fL (ref 80.0–100.0)
Platelets: 206 10*3/uL (ref 150–400)
RBC: 2.82 MIL/uL — ABNORMAL LOW (ref 3.87–5.11)
RDW: 14.2 % (ref 11.5–15.5)
WBC: 6.2 10*3/uL (ref 4.0–10.5)
nRBC: 0 % (ref 0.0–0.2)

## 2019-02-01 LAB — SARS CORONAVIRUS 2 (TAT 6-24 HRS): SARS Coronavirus 2: NEGATIVE

## 2019-02-01 NOTE — Progress Notes (Signed)
Physical Therapy Treatment Patient Details Name: Jennifer Estrada MRN: [id number] DOB: 09-04-1945 Today's Date: 02/01/2019    History of Present Illness 73 y.o. female with medical history significant of allergic rhinitis, unspecified CKD, depression, arthritis of the knee, GERD, hyperlipidemia, migraine headaches, tremors, osteoporosis, vitamin D deficiency who is coming to the emergency department from the surgical center of [place], where she was waiting to have elective eye surgery, after having an unwitnessed fall while she was trying to use the bathroom falling on her left side.  Pt s/p ORIF left femur and currently TDWB status    PT Comments    POD # 2 pm session Assisted back to bed.  General transfer comment: assisted from recliner back to bed 1/4 pivot to pt's R + 2 assist.  General bed mobility comments: assisted back to bed + 2 assist and full support B LE up onto bed.  + 2 assist scoot to [facility].  Performed a few TE's AP, knee presses.  Pt had questions about her surgery so gave a copy of her X Rays before and after.  Discussed reasoning for amount pain and swelling.  Discussed TTWB.  Positioned in bed to comfort, elevation and applied ICE.   Follow Up Recommendations  SNF     Equipment Recommendations  None recommended by PT    Recommendations for Other Services       Precautions / Restrictions Precautions Precautions: Fall Precaution Comments: anxiety Restrictions Weight Bearing Restrictions: Yes LLE Weight Bearing: Touchdown weight bearing    Mobility  Bed Mobility Overal bed mobility: Needs Assistance Bed Mobility: Sit to Supine     Supine to sit: Total assist;+2 for physical assistance;+2 for safety/equipment Sit to supine: Total assist;+2 for physical assistance;+2 for safety/equipment   General bed mobility comments: assisted back to bed + 2 assist and full support B LE up onto bed.  + 2 assist scoot to [facility]  Transfers Overall transfer level: Needs  assistance Equipment used: None Transfers: Stand Pivot Transfers Sit to Stand: Max assist;+2 physical assistance;+2 safety/equipment         General transfer comment: assisted from recliner back to bed 1/4 pivot to pt's R + 2 assist  Ambulation/Gait Ambulation/Gait assistance: Max assist;+2 physical assistance;+2 safety/equipment Gait Distance (Feet): 2 Feet Assistive device: Bilateral platform walker Gait Pattern/deviations: Step-to pattern;Decreased step length - right;Decreased stance time - left Gait velocity: decreased   General Gait Details: transfer back to bed only this session due to pain and fatigue   Stairs             Wheelchair Mobility    Modified Rankin (Stroke Patients Only)       Balance                                            Cognition Arousal/Alertness: Awake/alert Behavior During Therapy: WFL for tasks assessed/performed Overall Cognitive Status: Within Functional Limits for tasks assessed                                 General Comments: AxO x 4 with less anxiety this session      Exercises  10 reps AP and knee presses    General Comments        Pertinent Vitals/Pain Pain Assessment: 0-10 Pain Score: 8  Pain Location: L  hip Pain Descriptors / Indicators: Aching;Grimacing;Guarding;Sore;Tender;Tightness Pain Intervention(s): Monitored during session;Premedicated before session;Repositioned;Ice applied    Home Living                      Prior Function            PT Goals (current goals can now be found in the care plan section) Progress towards PT goals: Progressing toward goals    Frequency    Min 3X/week      PT Plan Current plan remains appropriate    Co-evaluation              AM-PAC PT "6 Clicks" Mobility   Outcome Measure  Help needed turning from your back to your side while in a flat bed without using bedrails?: A Lot Help needed moving from lying on your  back to sitting on the side of a flat bed without using bedrails?: A Lot Help needed moving to and from a bed to a chair (including a wheelchair)?: A Lot Help needed standing up from a chair using your arms (e.g., wheelchair or bedside chair)?: A Lot Help needed to walk in hospital room?: A Lot Help needed climbing 3-5 steps with a railing? : Total 6 Click Score: 11    End of Session Equipment Utilized During Treatment: Gait belt Activity Tolerance: Patient tolerated treatment well Patient left: in bed;with call bell/phone within reach;with bed alarm set;with family/visitor present Nurse Communication: Mobility status PT Visit Diagnosis: Other abnormalities of gait and mobility (R26.89)     Time: 8416-6063 PT Time Calculation (min) (ACUTE ONLY): 25 min  Charges:  $Gait Training: 8-22 mins $Therapeutic Activity: 23-37 mins                     Rica Koyanagi  PTA Acute  Rehabilitation Services Pager      [phone] Office      [phone]

## 2019-02-01 NOTE — Care Management Important Message (Signed)
Important Message  Patient Details IM Letter given to Jennifer Phi RN Case Manager to present to the Patient Name: Jennifer Estrada MRN: [id number] Date of Birth: [birth year]   Medicare Important Message Given:  Yes     Jennifer Estrada 02/01/2019, 2:45 PM

## 2019-02-01 NOTE — Progress Notes (Signed)
Physical Therapy Treatment Patient Details Name: Jennifer Estrada MRN: [id number] DOB: 1945-10-20 Today's Date: 02/01/2019    History of Present Illness 73 y.o. female with medical history significant of allergic rhinitis, unspecified CKD, depression, arthritis of the knee, GERD, hyperlipidemia, migraine headaches, tremors, osteoporosis, vitamin D deficiency who is coming to the emergency department from the surgical center of [place], where she was waiting to have elective eye surgery, after having an unwitnessed fall while she was trying to use the bathroom falling on her left side.  Pt s/p ORIF left femur and currently TDWB status    PT Comments    Returned to room to assist pt off BSC and attempt to amb.  General Gait Details: heavy lean on EVA walker requiring + 2 side by side assist and 75% VC's to decrease anxiety.  Pt was able to take a few pivot steps from [facility] to recliner. Pt progressing slowly and will ST Rehab at SNF prior to returning home.  Follow Up Recommendations  SNF     Equipment Recommendations  None recommended by PT    Recommendations for Other Services       Precautions / Restrictions Precautions Precautions: Fall Precaution Comments: anxiety Restrictions Weight Bearing Restrictions: Yes    Mobility  Bed Mobility  OOB on BSC  Transfers Overall transfer level: Needs assistance Equipment used: None Transfers:  Transfers sit to stand  Sit to Stand: Max assist;+2 physical assistance;+2 safety/equipment           Assisted off BSC was + 2 side by side assist from [facility] to B Platform EVA walker.  Ambulation/Gait Ambulation/Gait assistance: Max assist;+2 physical assistance;+2 safety/equipment Gait Distance (Feet): 2 Feet Assistive device: Bilateral platform walker Gait Pattern/deviations: Step-to pattern;Decreased step length - right;Decreased stance time - left Gait velocity: decreased   Assisted + 2 side by side assist from [facility] to stand using B  platform EVA walker.  75% VC's to stand upright and "all weight on R LE".   Stairs             Wheelchair Mobility    Modified Rankin (Stroke Patients Only)       Balance                                            Cognition Arousal/Alertness: Awake/alert Behavior During Therapy: WFL for tasks assessed/performed Overall Cognitive Status: Within Functional Limits for tasks assessed                                 General Comments: AxO x 4 but very nervous with HIGH anxiety with mvt      Exercises  15 reps AP 5 reps LAQ's L LR    General Comments        Pertinent Vitals/Pain Pain Assessment: 0-10 Pain Score: 10-Worst pain ever Pain Location: L hip Pain Descriptors / Indicators: Aching;Grimacing;Guarding;Sore;Tender;Tightness Pain Intervention(s): Monitored during session;Premedicated before session;Repositioned;Ice applied    Home Living                      Prior Function            PT Goals (current goals can now be found in the care plan section) Progress towards PT goals: Progressing toward goals    Frequency    Min  3X/week      PT Plan Current plan remains appropriate    Co-evaluation              AM-PAC PT "6 Clicks" Mobility   Outcome Measure  Help needed turning from your back to your side while in a flat bed without using bedrails?: A Lot Help needed moving from lying on your back to sitting on the side of a flat bed without using bedrails?: A Lot Help needed moving to and from a bed to a chair (including a wheelchair)?: A Lot Help needed standing up from a chair using your arms (e.g., wheelchair or bedside chair)?: A Lot Help needed to walk in hospital room?: A Lot Help needed climbing 3-5 steps with a railing? : Total 6 Click Score: 11    End of Session Equipment Utilized During Treatment: Gait belt Activity Tolerance: Other (comment)(required increased time and TLC to decrease anxiety  and increase self participation) Patient left: in chair;with call bell/phone within reach Nurse Communication: Mobility status PT Visit Diagnosis: Other abnormalities of gait and mobility (R26.89)     Time:11:24 - 11:50 PT Time Calculation (min) (ACUTE ONLY): 26 min  Charges:   1 gt                        1 te                     Rica Koyanagi  PTA Acute  Rehabilitation Services Pager      [phone] Office      [phone]

## 2019-02-01 NOTE — TOC Progression Note (Signed)
Transition of Care [facility]) - Progression Note    Patient Details  Name: LYSA LIVENGOOD MRN: [id number] Date of Birth: 1945-03-14  Transition of [facility]) CM/SW Contact  Jomari Bartnik, Juliann Pulse, RN Phone Number: 02/01/2019, 10:37 AM  Clinical Narrative:  Await covid results. Has a bed @ [facility].     Expected Discharge Plan: Skilled Nursing Facility Barriers to Discharge: Other (comment)(await covid;No auth needed.)  Expected Discharge Plan and Services Expected Discharge Plan: [place]   Discharge Planning Services: CM Consult Post Acute Care Choice: [facility] Living arrangements for the past 2 months: Single Family Home                                       Social Determinants of Health (SDOH) Interventions    Readmission Risk Interventions No flowsheet data found.

## 2019-02-01 NOTE — Progress Notes (Signed)
Physical Therapy Treatment Patient Details Name: Jennifer Estrada MRN: [id number] DOB: 12/11/1945 Today's Date: 02/01/2019    History of Present Illness 73 y.o. female with medical history significant of allergic rhinitis, unspecified CKD, depression, arthritis of the knee, GERD, hyperlipidemia, migraine headaches, tremors, osteoporosis, vitamin D deficiency who is coming to the emergency department from the surgical center of [place], where she was waiting to have elective eye surgery, after having an unwitnessed fall while she was trying to use the bathroom falling on her left side.  Pt s/p ORIF left femur and currently TDWB status    PT Comments    Pt in bed with spouse at bedside. General Comments: AxO x 4 but very nervous with HIGH anxiety with mvt.  Assisted OOB to [facility].   General bed mobility comments: carefully and slowly due to pt's increased anxiety, guarding and pain level.  General transfer comment: demonstarted and instructed then pt performed stand pivot 1/4 turn towards her R from elevated bed to [facility] with 50% VC's on proper hand placement, proper hand transfer and instructed to "keep all weight on R LE.  Pt requested increased time to sit on [facility] in attempt to have a BM Pt will need ST Rehab at SNF prior to returning home.    Follow Up Recommendations  SNF     Equipment Recommendations  None recommended by PT    Recommendations for Other Services       Precautions / Restrictions Precautions Precautions: Fall Precaution Comments: anxiety Restrictions Weight Bearing Restrictions: Yes    Mobility  Bed Mobility Overal bed mobility: Needs Assistance Bed Mobility: Supine to Sit     Supine to sit: Independent;+2 for physical assistance;+2 for safety/equipment     General bed mobility comments: carefully and slowly due to pt's increased anxiety, guarding and pain level.  Transfers Overall transfer level: Needs assistance Equipment used: None Transfers: Stand Pivot  Transfers Sit to Stand: Max assist;+2 physical assistance;+2 safety/equipment         General transfer comment: demonstarted and instructed then pt performed stand pivot 1/4 turn towards her R from elevated bed to [facility] with 50% VC's on proper hand placement, proper hand transfer and instructed to "keep all weight on R LE" and pt partially stood requiring increased assist to complete turn.   Ambulation/Gait  Stairs             Wheelchair Mobility    Modified Rankin (Stroke Patients Only)       Balance                                            Cognition Arousal/Alertness: Awake/alert Behavior During Therapy: WFL for tasks assessed/performed Overall Cognitive Status: Within Functional Limits for tasks assessed                                 General Comments: AxO x 4 but very nervous with HIGH anxiety with mvt      Exercises      General Comments        Pertinent Vitals/Pain Pain Assessment: 0-10 Pain Score: 10-Worst pain ever Pain Location: L hip Pain Descriptors / Indicators: Aching;Grimacing;Guarding;Sore;Tender;Tightness Pain Intervention(s): Monitored during session;Premedicated before session;Repositioned;Ice applied    Home Living  Prior Function            PT Goals (current goals can now be found in the care plan section) Progress towards PT goals: Progressing toward goals    Frequency    Min 3X/week      PT Plan Current plan remains appropriate    Co-evaluation              AM-PAC PT "6 Clicks" Mobility   Outcome Measure  Help needed turning from your back to your side while in a flat bed without using bedrails?: A Lot Help needed moving from lying on your back to sitting on the side of a flat bed without using bedrails?: A Lot Help needed moving to and from a bed to a chair (including a wheelchair)?: A Lot Help needed standing up from a chair using your arms (e.g.,  wheelchair or bedside chair)?: A Lot Help needed to walk in hospital room?: A Lot Help needed climbing 3-5 steps with a railing? : Total 6 Click Score: 11    End of Session Equipment Utilized During Treatment: Gait belt Activity Tolerance: Other (comment)(required increased time and TLC to decrease anxiety and increase self participation) Patient left: on [facility] ;with call bell/phone within reach Nurse Communication: Mobility status PT Visit Diagnosis: Other abnormalities of gait and mobility (R26.89)     Time: 9373-4287 PT Time Calculation (min) (ACUTE ONLY): 26 min  Charges:  $Therapeutic Activity: 23-37 mins                     Rica Koyanagi  PTA Acute  Rehabilitation Services Pager      [phone] Office      [phone]

## 2019-02-01 NOTE — Progress Notes (Addendum)
Progress Note    Jennifer MillinerBrenda L Estrada  ZOX:[id number] DOB: [birth year]  DOA: 01/26/2019 PCP: Jennifer Estrada, Hal, MD         Assessment/Plan:   Principal Problem:   Closed left hip fracture, initial encounter (HCC) Active Problems:   Tremors of nervous system   GERD (gastroesophageal reflux disease)   Hyperlipidemia   Depression   Normocytic anemia   Syncope   Body mass index is 27.75 kg/m.   Closed left hip fracture: s/p open reduction and intramedullary fixation of left femur 01/27/2019.  Analgesics as needed for pain.  PT and OT.  Acute blood loss anemia: Hemoglobin improved status post blood transfusion with 1 unit of PRBCs.  Constipation: Continue laxatives.  GERD: Continue Protonix  Hyperlipidemia: On Crestor  Hypotension: Resolved  History of migraine: Continue propranolol  Depression: Continue antidepressants    Family Communication/Anticipated D/C date and plan/Code Status   DVT prophylaxis: Lovenox Code Status: Full code Family Communication: Plan discussed with the patient and her husband at the bedside Disposition Plan: Discharge to SNF tomorrow pending Covid test.  Patient will be discharged if repeat Covid test is negative.      Subjective:   She complains of constipation and abdominal discomfort.  She still has some pain in the left hip but is better.  Objective:    Vitals:   01/31/19 1912 01/31/19 2105 02/01/19 0550 02/01/19 1244  BP:  107/66 126/74 122/72  Pulse: 95 87 79 94  Resp:  18 20 16   Temp:  98.9 F (37.2 C) 98.1 F (36.7 C) 97.9 F (36.6 C)  TempSrc:  Oral Oral Oral  SpO2:   95% 99%  Weight:      Height:        Intake/Output Summary (Last 24 hours) at 02/01/2019 1255 Last data filed at 02/01/2019 [id number] Gross per 24 hour  Intake 2436.41 ml  Output 1109 ml  Net 1327.41 ml   Filed Weights   01/26/19 2200  Weight: 78 kg    Exam:  GEN: NAD SKIN: No rash EYES: Abnormal left eye ENT: MMM CV: RRR PULM:  CTA B ABD: soft, ND, NT, +BS CNS: AAO x 3, non focal EXT: Mild swelling and tenderness of left hip.  Left hip surgical wound is clean, dry and intact   Data Reviewed:   I have personally reviewed following labs and imaging studies:  Labs: Labs show the following:   Basic Metabolic Panel: Recent Labs  Lab 01/28/19 0531 01/29/19 0517 01/30/19 0559 01/31/19 0515 02/01/19 0520  NA 135 137 136 138 138  K 4.1 3.9 3.7 3.7 3.7  CL 104 107 106 106 108  CO2 23 24 23 26 24   GLUCOSE 121* 104* 95 98 98  BUN 15 15 13 12 10   CREATININE 0.97 0.85 0.77 0.79 0.74  CALCIUM 8.4* 8.4* 8.4* 8.2* 8.5*   GFR Estimated Creatinine Clearance: 65.1 mL/min (by C-G formula based on SCr of 0.74 mg/dL). Liver Function Tests: Recent Labs  Lab 01/27/19 0545  AST 19  ALT 19  ALKPHOS 47  BILITOT 1.4*  PROT 5.8*  ALBUMIN 3.1*   No results for input(s): LIPASE, AMYLASE in the last 168 hours. No results for input(s): AMMONIA in the last 168 hours. Coagulation profile Recent Labs  Lab 01/26/19 0931  INR 1.0    CBC: Recent Labs  Lab 01/26/19 0931 01/28/19 0531 01/29/19 0517 01/30/19 0559 01/31/19 0515 01/31/19 1547 02/01/19 0520  WBC 7.1 9.2 10.1 6.9 6.6  --  6.2  NEUTROABS 4.4  --   --   --   --   --   --   HGB 11.3* 8.6* 8.3* 7.5* 6.8* 8.6* 8.5*  HCT 36.4 27.8* 26.8* 24.3* 22.2* 26.5* 27.3*  MCV 97.6 97.9 99.3 98.8 98.2  --  96.8  PLT 185 147* 140* 147* 185  --  206   Cardiac Enzymes: No results for input(s): CKTOTAL, CKMB, CKMBINDEX, TROPONINI in the last 168 hours. BNP (last 3 results) No results for input(s): PROBNP in the last 8760 hours. CBG: No results for input(s): GLUCAP in the last 168 hours. D-Dimer: No results for input(s): DDIMER in the last 72 hours. Hgb A1c: No results for input(s): HGBA1C in the last 72 hours. Lipid Profile: No results for input(s): CHOL, HDL, LDLCALC, TRIG, CHOLHDL, LDLDIRECT in the last 72 hours. Thyroid function studies: No results for  input(s): TSH, T4TOTAL, T3FREE, THYROIDAB in the last 72 hours.  Invalid input(s): FREET3 Anemia work up: No results for input(s): VITAMINB12, FOLATE, FERRITIN, TIBC, IRON, RETICCTPCT in the last 72 hours. Sepsis Labs: Recent Labs  Lab 01/29/19 0517 01/30/19 0559 01/31/19 0515 02/01/19 0520  WBC 10.1 6.9 6.6 6.2    Microbiology Recent Results (from the past 240 hour(s))  Respiratory Panel by RT PCR (Flu A&B, Covid) - Nasopharyngeal Swab     Status: None   Collection Time: 01/26/19 11:44 AM   Specimen: Nasopharyngeal Swab  Result Value Ref Range Status   SARS Coronavirus 2 by RT PCR NEGATIVE NEGATIVE Final    Comment: (NOTE) SARS-CoV-2 target nucleic acids are NOT DETECTED. The SARS-CoV-2 RNA is generally detectable in upper respiratoy specimens during the acute phase of infection. The lowest concentration of SARS-CoV-2 viral copies this assay can detect is 131 copies/mL. A negative result does not preclude SARS-Cov-2 infection and should not be used as the sole basis for treatment or other patient management decisions. A negative result may occur with  improper specimen collection/handling, submission of specimen other than nasopharyngeal swab, presence of viral mutation(s) within the areas targeted by this assay, and inadequate number of viral copies (<131 copies/mL). A negative result must be combined with clinical observations, patient history, and epidemiological information. The expected result is Negative. Fact Sheet for Patients:  https://www.moore.com/ Fact Sheet for Healthcare Providers:  https://www.young.biz/ This test is not yet ap proved or cleared by the [place] FDA and  has been authorized for detection and/or diagnosis of SARS-CoV-2 by FDA under an Emergency Use Authorization (EUA). This EUA will remain  in effect (meaning this test can be used) for the duration of the COVID-19 declaration under Section 564(b)(1)  of the Act, 21 U.S.C. section 360bbb-3(b)(1), unless the authorization is terminated or revoked sooner.    Influenza A by PCR NEGATIVE NEGATIVE Final   Influenza B by PCR NEGATIVE NEGATIVE Final    Comment: (NOTE) The Xpert Xpress SARS-CoV-2/FLU/RSV assay is intended as an aid in  the diagnosis of influenza from Nasopharyngeal swab specimens and  should not be used as a sole basis for treatment. Nasal washings and  aspirates are unacceptable for Xpert Xpress SARS-CoV-2/FLU/RSV  testing. Fact Sheet for Patients: https://www.moore.com/ Fact Sheet for Healthcare Providers: https://www.young.biz/ This test is not yet approved or cleared by the [place] FDA and  has been authorized for detection and/or diagnosis of SARS-CoV-2 by  FDA under an Emergency Use Authorization (EUA). This EUA will remain  in effect (meaning this test can be used) for the duration of the  Covid-19  declaration under Section 564(b)(1) of the Act, 21  U.S.C. section 360bbb-3(b)(1), unless the authorization is  terminated or revoked. Performed at [facility], [place] [address], [place], [place] [address]   Surgical PCR screen     Status: None   Collection Time: 01/27/19  1:08 AM   Specimen: Nasal Mucosa; Nasal Swab  Result Value Ref Range Status   MRSA, PCR NEGATIVE NEGATIVE Final   Staphylococcus aureus NEGATIVE NEGATIVE Final    Comment: (NOTE) The Xpert SA Assay (FDA approved for NASAL specimens in patients 81 years of age and older), is one component of a comprehensive surveillance program. It is not intended to diagnose infection nor to guide or monitor treatment. Performed at [facility], [place] [address] [place], [place] [address]     Procedures and diagnostic studies:  No results found.  Medications:   . sodium chloride   Intravenous Once  . docusate sodium  100 mg Oral BID  . enoxaparin (LOVENOX) injection  40 mg  Subcutaneous Q24H  . loratadine  10 mg Oral Daily  . magnesium citrate  1 Bottle Oral Once  . mirabegron ER  50 mg Oral QHS  . ofloxacin  1 drop Right Eye QID  . pantoprazole  40 mg Oral Daily  . PARoxetine  20 mg Oral q morning - 10a  . polyethylene glycol  17 g Oral Daily  . prednisoLONE acetate  1 drop Left Eye BID  . propranolol ER  60 mg Oral Daily  . rosuvastatin  5 mg Oral QHS  . senna  1 tablet Oral BID  . Vitamin D (Ergocalciferol)  50,000 Units Oral Q7 days   Continuous Infusions: . sodium chloride 50 mL/hr at 01/31/19 2144  . methocarbamol (ROBAXIN) IV       LOS: 6 days   Kaleea Penner  [facility]   *Please refer to [email or web address], password [email or web address] to get updated schedule on who will round on this patient, as hospitalists switch teams weekly. If 7PM-7AM, please contact night-coverage at [email or web address], password [email or web address] for any overnight needs.  02/01/2019, 12:55 PM

## 2019-02-01 NOTE — Progress Notes (Addendum)
Patient is complaining of a migraine. She stated that her propranolol was held today and that is what helps with her migraines. Patient's vital signs are stable. MD Maudie Mercury was paged. Verbal order by MD Maudie Mercury to give patient's propranolol.

## 2019-02-02 MED ORDER — POLYETHYLENE GLYCOL 3350 17 G PO PACK: 17.0000 g | PACK | Freq: Every day | ORAL | 0 refills | Status: DC | PRN

## 2019-02-02 MED ORDER — ENSURE ENLIVE PO LIQD: 237.0000 mL | ORAL | Status: DC

## 2019-02-02 MED ORDER — ADULT MULTIVITAMIN W/MINERALS CH
1.0000 | ORAL_TABLET | Freq: Every day | ORAL | Status: DC
  Administered 2019-02-02: 09:00:00 1 via ORAL
  Filled 2019-02-02: qty 1

## 2019-02-02 NOTE — TOC Transition Note (Signed)
Transition of Care [facility]) - CM/SW Discharge Note   Patient Details  Name: Jennifer Estrada MRN: [id number] Date of Birth: [birth year]  Transition of Care [facility]) CM/SW Contact:  Dessa Phi, RN Phone Number: 02/02/2019, 11:36 AM   Clinical Narrative:  Faxed discharg summary-[facility] rep Lavella Lemons has bed available going to rm 311 tel# for nurse to call report [phone]. PTAR for transport.     Final next level of care: [place] Barriers to Discharge: No Barriers Identified   Patient Goals and CMS Choice Patient states their goals for this hospitalization and ongoing recovery are:: go home      Discharge Placement   Existing PASRR number confirmed : 02/02/19          Patient chooses bed at: [place] Patient to be transferred to facility by: [place] Name of family member notified: Chrissie Noa spouse Patient and family notified of of transfer: 02/02/19  Discharge Plan and Services   Discharge Planning Services: CM Consult Post Acute Care Choice: [place]                               Social Determinants of Health (SDOH) Interventions     Readmission Risk Interventions No flowsheet data found.

## 2019-02-02 NOTE — Progress Notes (Signed)
Nutrition Follow-up  DOCUMENTATION CODES:   Not applicable  INTERVENTION:  - will order Ensure Enlive once/day, each supplement provides 350 kcal and 20 grams of protein. - will order daily multivitamin with minerals. - continue to encourage PO intakes.  - weigh patient today.    NUTRITION DIAGNOSIS:   Increased nutrient needs related to post-op healing as evidenced by estimated needs. -ongoing  GOAL:   Patient will meet greater than or equal to 90% of their needs -minimally met  MONITOR:   PO intake, Supplement acceptance, Labs, Weight trends, Skin  ASSESSMENT:   73 y.o. female with medical history significant of allergic rhinitis, unspecified CKD, depression, arthritis of the knee, GERD, hyperlipidemia, migraine headaches, basal tremors, osteoporosis, and vitamin D deficiency. She presented to the ED from [place] after an unwitnessed fall while trying to use the bathroom. She was at the Center for elective eye surgery.  Diet advanced from NPO to Regular on 12/16 at 1640 and patient was able to eat 50% of dinner that night. She has been 80-100% of meals since that time without any difficulty. Patient has not been weighed since 12/15.   Per notes: - closed L hip fx s/p ORIF on 12/16 - acute blood loss anemia--s/p 1 unit PRBCs - constipation--bowel regimen ordered - plan for discharge to SNF as soon as today (12/22) pending repeat COVID test    Labs reviewed; Ca: 8.5 mg/dl. Medications reviewed; 100 mg colace BID, 1 packet miralax/day, 1 tablet senokot BID, 23762 units drisdol every 7 days starting 12/18.     Diet Order:   Diet Order            Diet regular Room service appropriate? Yes; Fluid consistency: Thin  Diet effective now              EDUCATION NEEDS:   No education needs have been identified at this time  Skin:  Skin Assessment: Skin Integrity Issues: Skin Integrity Issues:: Incisions Incisions: L hip (12/16)  Last BM:  12/21  Height:    Ht Readings from Last 1 Encounters:  01/26/19 '5\' 6"'  (1.676 m)    Weight:   Wt Readings from Last 1 Encounters:  01/26/19 78 kg    Ideal Body Weight:  59.1 kg  BMI:  Body mass index is 27.75 kg/m.  Estimated Nutritional Needs:   Kcal:  1850-2100 kcal  Protein:  85-100 grams  Fluid:  >/= 1.8 L/day     Jarome Matin, MS, RD, LDN, [facility] Inpatient Clinical Dietitian Pager # [phone] After hours/weekend pager # [phone]

## 2019-02-02 NOTE — Progress Notes (Signed)
Report called to Myna Hidalgo at [facility],  IV and telemetry removed, personal belongings with husband, Corey Harold to transport

## 2019-02-02 NOTE — TOC Transition Note (Signed)
Transition of Care [facility]) - CM/SW Discharge Note   Patient Details  Name: Jennifer Estrada MRN: [id number] Date of Birth: 05/17/1945  Transition of Care [facility]) CM/SW Contact:  Dessa Phi, RN Phone Number: 02/02/2019, 11:05 AM   Clinical Narrative: Venia Minks aware-CM await rm tel# for nurse to call report. PTAR for transport.      Final next level of care: Skilled Nursing Facility Barriers to Discharge: No Barriers Identified   Patient Goals and CMS Choice Patient states their goals for this hospitalization and ongoing recovery are:: go home      Discharge Placement              Patient chooses bed at: [facility] Patient to be transferred to facility by: [place] Name of family member notified: Chrissie Noa son Patient and family notified of of transfer: 02/02/19  Discharge Plan and Services   Discharge Planning Services: CM Consult Post Acute Care Choice: [place]                               Social Determinants of Health (SDOH) Interventions     Readmission Risk Interventions No flowsheet data found.

## 2019-02-02 NOTE — Progress Notes (Signed)
Occupational Therapy Treatment Patient Details Name: Jennifer Estrada MRN: [id number] DOB: 1946/01/31 Today's Date: 02/02/2019    History of present illness 73 y.o. female with medical history significant of allergic rhinitis, unspecified CKD, depression, arthritis of the knee, GERD, hyperlipidemia, migraine headaches, tremors, osteoporosis, vitamin D deficiency who is coming to the emergency department from the surgical center of [place], where she was waiting to have elective eye surgery, after having an unwitnessed fall while she was trying to use the bathroom falling on her left side.  Pt s/p ORIF left femur and currently TDWB status   OT comments  Pt plans to go to SNF for rehab. Pt is very participative and motivated  Follow Up Recommendations  SNF    Equipment Recommendations  Other (comment)(TBD at next venue of care)    Recommendations for Other Services      Precautions / Restrictions Precautions Precautions: Fall Restrictions Weight Bearing Restrictions: Yes LLE Weight Bearing: Touchdown weight bearing       Mobility Bed Mobility Overal bed mobility: Needs Assistance Bed Mobility: Supine to Sit     Supine to sit: Max assist        Transfers Overall transfer level: Needs assistance Equipment used: None Transfers: Stand Pivot Transfers Sit to Stand: Max assist Stand pivot transfers: Max assist       General transfer comment: bed to chair maintaining TDWB    Balance Overall balance assessment: History of Falls                                         ADL either performed or assessed with clinical judgement   ADL Overall ADL's : Needs assistance/impaired     Grooming: Sitting;Wash/dry face;Cueing for safety                   Toilet Transfer: Maximal assistance;RW;Cueing for sequencing;Cueing for safety;Stand-pivot Toilet Transfer Details (indicate cue type and reason): MAX VC for TDWB Toileting- Clothing Manipulation and  Hygiene: Total assistance;Sit to/from stand;Cueing for sequencing;Cueing for compensatory techniques;Cueing for safety Toileting - Clothing Manipulation Details (indicate cue type and reason): VC to maintain TDWB             Vision Patient Visual Report: No change from baseline            Cognition Arousal/Alertness: Awake/alert Behavior During Therapy: WFL for tasks assessed/performed Overall Cognitive Status: Within Functional Limits for tasks assessed                                 General Comments: AxO x 4 with less anxiety this session                   Pertinent Vitals/ Pain       Pain Assessment: 0-10 Pain Score: 3  Pain Location: L hip Pain Descriptors / Indicators: Aching;Grimacing;Guarding;Sore;Tender;Tightness         Frequency  Min 2X/week        Progress Toward Goals  OT Goals(current goals can now be found in the care plan section)  Progress towards OT goals: Progressing toward goals     Plan Discharge plan remains appropriate    Co-evaluation                 AM-PAC OT "6 Clicks" Daily Activity     Outcome  Measure   Help from another person eating meals?: None Help from another person taking care of personal grooming?: A Little Help from another person toileting, which includes using toliet, bedpan, or urinal?: Total Help from another person bathing (including washing, rinsing, drying)?: A Lot Help from another person to put on and taking off regular upper body clothing?: A Little Help from another person to put on and taking off regular lower body clothing?: Total 6 Click Score: 14    End of Session Equipment Utilized During Treatment: Rolling walker;Gait belt  OT Visit Diagnosis: Other abnormalities of gait and mobility (R26.89);Muscle weakness (generalized) (M62.81);History of falling (Z91.81);Pain;Other symptoms and signs involving cognitive function Pain - Right/Left: Left Pain - part of body: Hip;Leg    Activity Tolerance No increased pain;Patient tolerated treatment well   Patient Left with call bell/phone within reach;with family/visitor present;in chair;with chair alarm set   Nurse Communication Mobility status        Time: 1040-1102 OT Time Calculation (min): 22 min  Charges: OT General Charges $OT Visit: 1 Visit OT Treatments $Self Care/Home Management : 8-22 mins  Lise Auer, OT Acute Rehabilitation Services [phone] Office- [phone]      Chanequa Spees, Karin Golden D 02/02/2019, 11:56 AM

## 2019-02-02 NOTE — Discharge Summary (Signed)
Physician Discharge Summary  Jennifer Estrada EAV:[id number] DOB: 04-01-1945 DOA: 01/26/2019  PCP: Merlene Laughter, MD  Admit date: 01/26/2019 Discharge date: 02/02/2019  Discharge disposition: Skilled nursing facility   Recommendations for Outpatient Follow-Up:   1. Outpatient follow-up with orthopedic surgeon, Dr. Leroy Libman, within 2 weeks 2. Follow-up with physician at the nursing home within 3 days of discharge 3. Follow up with ophthalmologist as scheduled   Discharge Diagnosis:   Principal Problem:   Closed left hip fracture, initial encounter (HCC) Active Problems:   Tremors of nervous system   GERD (gastroesophageal reflux disease)   Hyperlipidemia   Depression   Normocytic anemia   Syncope    Discharge Condition: Stable.  Diet recommendation: Low-salt diet  Code status: Full code    Hospital Course:   Ms. Jennifer Estrada is a 73 year old man with past medical history significant for allergic rhinitis, depression, arthritis of the knee, GERD, hyperlipidemia, migraine headaches, essential tremors, osteoporosis, vitamin D deficiency, cataract in the right eye: History of left endophthalmitis and pseudophakia..  She presented to the hospital after a fall at the eye clinic.  She was found to have left hip fracture.  She was seen in consultation by orthopedic surgeon, Dr. Linna Caprice and she underwent open reduction and intramedullary fixation of left femur on 01/27/2019. She developed acute blood loss anemia.  Her hemoglobin dropped to 6.8 on 01/31/2019 from an admission hemoglobin of 11.3 on 01/26/2019.  She was transfused with 1 unit of packed red blood cells and hemoglobin has improved.  She was evaluated by PT and OT who recommended further rehabilitation at the skilled nursing facility.  Her condition has improved and she is deemed stable for discharge to SNF today.  Repeat coronavirus test was negative.        Discharge Exam:   Vitals:   02/01/19 2022  02/02/19 0448  BP: 137/64 128/79  Pulse: 100 72  Resp: 18 16  Temp: 97.8 F (36.6 C) 98.1 F (36.7 C)  SpO2: 97% 96%   Vitals:   02/01/19 1244 02/01/19 2022 02/02/19 0448 02/02/19 0855  BP: 122/72 137/64 128/79   Pulse: 94 100 72   Resp: Temp: 97.9 F (36.6 C) 97.8 F (36.6 C) 98.1 F (36.7 C)   TempSrc: Oral Oral    SpO2: 99% 97% 96%   Weight:    81.3 kg  Height:         GEN: NAD SKIN: No rash EYES: No pallor or icterus ENT: MMM CV: RRR PULM: CTA B ABD: soft, ND, NT, +BS CNS: AAO x 3, non focal EXT: Left leg is slightly swollen and tender from recent surgery.  Left hip surgical wound is intact.   The results of significant diagnostics from this hospitalization (including imaging, microbiology, ancillary and laboratory) are listed below for reference.     Procedures and Diagnostic Studies:   DG Chest 1 View  Result Date: 01/26/2019 CLINICAL DATA:  Transported by [facility] from [facility] where pt was about to have cataract surgery-- pt has a unwitnessed fall injuring LLE. +shortening, obvious deformity, swelling, pain. No prior injury to L hip. EXAM: CHEST  1 VIEW COMPARISON:  05/02/2012 FINDINGS: Cardiac silhouette is normal in size. No mediastinal or hilar masses. No evidence of adenopathy. Mild scarring noted at the lung apices. Lungs otherwise clear. No pleural effusion or pneumothorax. Skeletal structures are grossly intact. IMPRESSION: No acute cardiopulmonary disease. Electronically Signed   By: Renard Hamper.D.  On: 01/26/2019 10:11   CT Head Wo Contrast  Result Date: 01/26/2019 CLINICAL DATA:  Unwitnessed fall. EXAM: CT HEAD WITHOUT CONTRAST TECHNIQUE: Contiguous axial images were obtained from the base of the skull through the vertex without intravenous contrast. COMPARISON:  February 07, 2014. FINDINGS: Brain: Mild chronic ischemic white matter disease is noted. No mass effect or midline shift is noted. Ventricular size is  within normal limits. There is no evidence of mass lesion, hemorrhage or acute infarction. Vascular: No hyperdense vessel or unexpected calcification. Skull: Normal. Negative for fracture or focal lesion. Sinuses/Orbits: No acute finding. Other: None. IMPRESSION: Mild chronic ischemic white matter disease. No acute intracranial abnormality seen. Electronically Signed   By: Lupita RaiderJames  Green Jr M.D.   On: 01/26/2019 10:21   Pelvis Portable  Result Date: 01/27/2019 CLINICAL DATA:  Status post left intramedullary rod fixation. EXAM: PORTABLE PELVIS 1-2 VIEWS COMPARISON:  January 26, 2019. FINDINGS: Status post surgical internal fixation and intramedullary rod fixation of intertrochanteric fracture of proximal left femur. Significantly improved alignment of fracture components is noted. Expected postoperative changes are seen in the surrounding soft tissues. IMPRESSION: Status post surgical internal fixation of intertrochanteric fracture of proximal left femur. Significantly improved alignment of fracture components is noted. Electronically Signed   By: Lupita RaiderJames  Green Jr M.D.   On: 01/27/2019 15:33   DG Knee Complete 4 Views Left  Result Date: 01/26/2019 CLINICAL DATA:  Prior left hip fracture. Insert EXAM: LEFT KNEE - COMPLETE 4+ VIEW COMPARISON:  05/20/2017 images, report not available. FINDINGS: Lateral view is inverted. AP and oblique views are not centered. Surgical screws noted in the distal femur appear to be intact. Diffuse osteopenia and degenerative change. No acute bony abnormality identified. IMPRESSION: Limited exam. No acute bony or joint abnormality identified. Surgical screws in the distal left femur intact. Diffuse degenerative change. Electronically Signed   By: Maisie Fushomas  Register   On: 01/26/2019 14:36   DG C-Arm 1-60 Min-No Report  Result Date: 01/27/2019 Fluoroscopy was utilized by the requesting physician.  No radiographic interpretation.   ECHOCARDIOGRAM COMPLETE  Result Date:  01/26/2019   ECHOCARDIOGRAM REPORT   Patient Name:   Jennifer MillinerBRENDA L Estrada Date of Exam: 01/26/2019 Medical Rec #:  [id number]         Height:       66.0 in Accession #:    [id number]        Weight:       166.0 lb Date of Birth:  [birth year]        BSA:          1.85 m Patient Age:    73 years          BP:           135/83 mmHg Patient Gender: F                 HR:           70 bpm. Exam Location:  Inpatient Procedure: 2D Echo Indications:    syncope 780.2  History:        Patient has prior history of Echocardiogram examinations, most                 recent 05/02/2012. Risk Factors:Dyslipidemia.  Sonographer:    Celene SkeenVijay Shankar RDCS (AE) Referring Phys: [id number] DAVID MANUEL ORTIZ  Sonographer Comments: restricted mobility IMPRESSIONS  1. Left ventricular ejection fraction, by visual estimation, is 60 to 65%. The left ventricle has normal function. There is no left  ventricular hypertrophy.  2. The left ventricle has no regional wall motion abnormalities.  3. Global right ventricle has normal systolic function.The right ventricular size is normal. No increase in right ventricular wall thickness.  4. Left atrial size was normal.  5. Right atrial size was normal.  6. The mitral valve is normal in structure. No evidence of mitral valve regurgitation. No evidence of mitral stenosis.  7. The tricuspid valve is normal in structure. Tricuspid valve regurgitation is not demonstrated.  8. The aortic valve is normal in structure. Aortic valve regurgitation is not visualized. No evidence of aortic valve sclerosis or stenosis.  9. The pulmonic valve was normal in structure. Pulmonic valve regurgitation is not visualized. 10. The inferior vena cava is normal in size with greater than 50% respiratory variability, suggesting right atrial pressure of 3 mmHg. FINDINGS  Left Ventricle: Left ventricular ejection fraction, by visual estimation, is 60 to 65%. The left ventricle has normal function. The left ventricle has no regional wall motion  abnormalities. There is no left ventricular hypertrophy. Left ventricular diastolic parameters were normal. Indeterminate filling pressures. Right Ventricle: The right ventricular size is normal. No increase in right ventricular wall thickness. Global RV systolic function is has normal systolic function. Left Atrium: Left atrial size was normal in size. Right Atrium: Right atrial size was normal in size Pericardium: There is no evidence of pericardial effusion. Mitral Valve: The mitral valve is normal in structure. No evidence of mitral valve regurgitation. No evidence of mitral valve stenosis by observation. Tricuspid Valve: The tricuspid valve is normal in structure. Tricuspid valve regurgitation is not demonstrated. Aortic Valve: The aortic valve is normal in structure. Aortic valve regurgitation is not visualized. The aortic valve is structurally normal, with no evidence of sclerosis or stenosis. Pulmonic Valve: The pulmonic valve was normal in structure. Pulmonic valve regurgitation is not visualized. Pulmonic regurgitation is not visualized. Aorta: The aortic root, ascending aorta and aortic arch are all structurally normal, with no evidence of dilitation or obstruction. Venous: The inferior vena cava is normal in size with greater than 50% respiratory variability, suggesting right atrial pressure of 3 mmHg. IAS/Shunts: No atrial level shunt detected by color flow Doppler. There is no evidence of a patent foramen ovale. No ventricular septal defect is seen or detected. There is no evidence of an atrial septal defect.  LEFT VENTRICLE PLAX 2D LVIDd:         3.50 cm  Diastology LVIDs:         2.20 cm  LV e' lateral:   7.62 cm/s LV PW:         0.90 cm  LV E/e' lateral: 9.6 LV IVS:        0.70 cm  LV e' medial:    6.85 cm/s LVOT diam:     2.00 cm  LV E/e' medial:  10.7 LV SV:         35 ml LV SV Index:   18.36 LVOT Area:     3.14 cm  RIGHT VENTRICLE RV S prime:     11.20 cm/s TAPSE (M-mode): 1.6 cm LEFT ATRIUM              Index       RIGHT ATRIUM           Index LA diam:        3.70 cm 2.00 cm/m  RA Area:     12.80 cm LA Vol (A2C):   23.9 ml 12.94 ml/m RA  Volume:   29.50 ml  15.97 ml/m LA Vol (A4C):   25.4 ml 13.75 ml/m LA Biplane Vol: 25.4 ml 13.75 ml/m  AORTIC VALVE LVOT Vmax:   95.30 cm/s LVOT Vmean:  57.500 cm/s LVOT VTI:    0.171 m  AORTA Ao Root diam: 2.60 cm MITRAL VALVE MV Area (PHT): 2.96 cm             SHUNTS MV PHT:        74.24 msec           Systemic VTI:  0.17 m MV Decel Time: 256 msec             Systemic Diam: 2.00 cm MV E velocity: 73.30 cm/s 103 cm/s MV A velocity: 77.60 cm/s 70.3 cm/s MV E/A ratio:  0.94       1.5  Mihai Croitoru MD Electronically signed by Thurmon Fair MD Signature Date/Time: 01/26/2019/3:55:50 PM    Final    DG HIP OPERATIVE UNILAT W OR W/O PELVIS LEFT  Result Date: 01/27/2019 CLINICAL DATA:  Hip fracture repair FLUOROSCOPY TIME:  1 minutes 57 seconds. Images: 7 EXAM: OPERATIVE LEFT HIP (WITH PELVIS IF PERFORMED) 7 VIEWS TECHNIQUE: Fluoroscopic spot image(s) were submitted for interpretation post-operatively. COMPARISON:  None. FINDINGS: The patient's left hip fracture is repaired throughout the study with a gamma nail and intramedullary femoral rod, affixed distally with a single interlocking screw. Hardware is in good position. IMPRESSION: Left hip fracture repair as above. Electronically Signed   By: Gerome Sam III M.D   On: 01/27/2019 14:13   DG Hip Unilat With Pelvis 2-3 Views Left  Result Date: 01/26/2019 CLINICAL DATA:  Transported by [facility] from [facility] where pt was about to have cataract surgery-- pt has a unwitnessed fall injuring LLE. +shortening, obvious deformity, swelling, pain. No prior injury to L hip. EXAM: DG HIP (WITH OR WITHOUT PELVIS) 2-3V LEFT COMPARISON:  None. FINDINGS: Intertrochanteric fracture of the proximal left femur. A fracture line crosses base of the femoral neck with another extending superior to the lesser  trochanter to the lateral proximal femoral metaphysis. Proximal fracture component is displaced laterally by 1.7 cm, with mild varus angulation. No other fractures.  Hip joints normally spaced and aligned. IMPRESSION: 1. Mildly displaced and varus angulated intertrochanteric fracture of the proximal left femur. No dislocation. Electronically Signed   By: Amie Portland M.D.   On: 01/26/2019 10:13   DG FEMUR PORT MIN 2 VIEWS LEFT  Result Date: 01/27/2019 CLINICAL DATA:  Status post left intramedullary rod femur fixation. EXAM: LEFT FEMUR PORTABLE 2 VIEWS COMPARISON:  January 26, 2019. FINDINGS: Status post surgical internal fixation and intramedullary rod fixation of left femur for treatment of intertrochanteric fracture of proximal left femur. Improved alignment of fracture components is noted. Expected postoperative changes are seen in the surrounding soft tissues. IMPRESSION: Status post surgical internal fixation of intertrochanteric fracture of proximal left femur. Improved alignment of fracture components is noted. Expected postoperative changes are noted. Electronically Signed   By: Lupita Raider M.D.   On: 01/27/2019 15:34     Labs:   Basic Metabolic Panel: Recent Labs  Lab 01/28/19 0531 01/29/19 0517 01/30/19 0559 01/31/19 0515 02/01/19 0520  NA 135 137 136 138 138  K 4.1 3.9 3.7 3.7 3.7  CL 104 107 106 106 108  CO2 GLUCOSE 121* 104* 95 98 98  BUN CREATININE 0.97 0.85  0.77 0.79 0.74  CALCIUM 8.4* 8.4* 8.4* 8.2* 8.5*   GFR Estimated Creatinine Clearance: 66.3 mL/min (by C-G formula based on SCr of 0.74 mg/dL). Liver Function Tests: Recent Labs  Lab 01/27/19 0545  AST 19  ALT 19  ALKPHOS 47  BILITOT 1.4*  PROT 5.8*  ALBUMIN 3.1*   No results for input(s): LIPASE, AMYLASE in the last 168 hours. No results for input(s): AMMONIA in the last 168 hours. Coagulation profile No results for input(s): INR, PROTIME in the last 168  hours.  CBC: Recent Labs  Lab 01/28/19 0531 01/29/19 0517 01/30/19 0559 01/31/19 0515 01/31/19 1547 02/01/19 0520  WBC 9.2 10.1 6.9 6.6  --  6.2  HGB 8.6* 8.3* 7.5* 6.8* 8.6* 8.5*  HCT 27.8* 26.8* 24.3* 22.2* 26.5* 27.3*  MCV 97.9 99.3 98.8 98.2  --  96.8  PLT 147* 140* 147* 185  --  206   Cardiac Enzymes: No results for input(s): CKTOTAL, CKMB, CKMBINDEX, TROPONINI in the last 168 hours. BNP: Invalid input(s): POCBNP CBG: No results for input(s): GLUCAP in the last 168 hours. D-Dimer No results for input(s): DDIMER in the last 72 hours. Hgb A1c No results for input(s): HGBA1C in the last 72 hours. Lipid Profile No results for input(s): CHOL, HDL, LDLCALC, TRIG, CHOLHDL, LDLDIRECT in the last 72 hours. Thyroid function studies No results for input(s): TSH, T4TOTAL, T3FREE, THYROIDAB in the last 72 hours.  Invalid input(s): FREET3 Anemia work up No results for input(s): VITAMINB12, FOLATE, FERRITIN, TIBC, IRON, RETICCTPCT in the last 72 hours. Microbiology Recent Results (from the past 240 hour(s))  Respiratory Panel by RT PCR (Flu A&B, Covid) - Nasopharyngeal Swab     Status: None   Collection Time: 01/26/19 11:44 AM   Specimen: Nasopharyngeal Swab  Result Value Ref Range Status   SARS Coronavirus 2 by RT PCR NEGATIVE NEGATIVE Final    Comment: (NOTE) SARS-CoV-2 target nucleic acids are NOT DETECTED. The SARS-CoV-2 RNA is generally detectable in upper respiratoy specimens during the acute phase of infection. The lowest concentration of SARS-CoV-2 viral copies this assay can detect is 131 copies/mL. A negative result does not preclude SARS-Cov-2 infection and should not be used as the sole basis for treatment or other patient management decisions. A negative result may occur with  improper specimen collection/handling, submission of specimen other than nasopharyngeal swab, presence of viral mutation(s) within the areas targeted by this assay, and inadequate number  of viral copies (<131 copies/mL). A negative result must be combined with clinical observations, patient history, and epidemiological information. The expected result is Negative. Fact Sheet for Patients:  PinkCheek.be Fact Sheet for Healthcare Providers:  GravelBags.it This test is not yet ap proved or cleared by the [place] FDA and  has been authorized for detection and/or diagnosis of SARS-CoV-2 by FDA under an Emergency Use Authorization (EUA). This EUA will remain  in effect (meaning this test can be used) for the duration of the COVID-19 declaration under Section 564(b)(1) of the Act, 21 U.S.C. section 360bbb-3(b)(1), unless the authorization is terminated or revoked sooner.    Influenza A by PCR NEGATIVE NEGATIVE Final   Influenza B by PCR NEGATIVE NEGATIVE Final    Comment: (NOTE) The Xpert Xpress SARS-CoV-2/FLU/RSV assay is intended as an aid in  the diagnosis of influenza from Nasopharyngeal swab specimens and  should not be used as a sole basis for treatment. Nasal washings and  aspirates are unacceptable for Xpert Xpress SARS-CoV-2/FLU/RSV  testing. Fact Sheet for Patients: PinkCheek.be  Fact Sheet for Healthcare Providers: https://www.young.biz/ This test is not yet approved or cleared by the [place] FDA and  has been authorized for detection and/or diagnosis of SARS-CoV-2 by  FDA under an Emergency Use Authorization (EUA). This EUA will remain  in effect (meaning this test can be used) for the duration of the  Covid-19 declaration under Section 564(b)(1) of the Act, 21  U.S.C. section 360bbb-3(b)(1), unless the authorization is  terminated or revoked. Performed at [facility], [address], [place], [place] [address]   Surgical PCR screen     Status: None   Collection Time: 01/27/19  1:08 AM   Specimen: Nasal Mucosa; Nasal Swab   Result Value Ref Range Status   MRSA, PCR NEGATIVE NEGATIVE Final   Staphylococcus aureus NEGATIVE NEGATIVE Final    Comment: (NOTE) The Xpert SA Assay (FDA approved for NASAL specimens in patients 85 years of age and older), is one component of a comprehensive surveillance program. It is not intended to diagnose infection nor to guide or monitor treatment. Performed at [facility], [address], [place], [place] [address]   SARS CORONAVIRUS 2 (TAT 6-24 HRS) Nasopharyngeal Nasopharyngeal Swab     Status: None   Collection Time: 02/01/19 10:01 AM   Specimen: Nasopharyngeal Swab  Result Value Ref Range Status   SARS Coronavirus 2 NEGATIVE NEGATIVE Final    Comment: (NOTE) SARS-CoV-2 target nucleic acids are NOT DETECTED. The SARS-CoV-2 RNA is generally detectable in upper and lower respiratory specimens during the acute phase of infection. Negative results do not preclude SARS-CoV-2 infection, do not rule out co-infections with other pathogens, and should not be used as the sole basis for treatment or other patient management decisions. Negative results must be combined with clinical observations, patient history, and epidemiological information. The expected result is Negative. Fact Sheet for Patients: HairSlick.no Fact Sheet for Healthcare Providers: quierodirigir.com This test is not yet approved or cleared by the [place] FDA and  has been authorized for detection and/or diagnosis of SARS-CoV-2 by FDA under an Emergency Use Authorization (EUA). This EUA will remain  in effect (meaning this test can be used) for the duration of the COVID-19 declaration under Section 56 4(b)(1) of the Act, 21 U.S.C. section 360bbb-3(b)(1), unless the authorization is terminated or revoked sooner. Performed at [facility], [address], [place], [place] [address]      Discharge Instructions:    Discharge Instructions    Diet general   Complete by: As directed    Increase activity slowly   Complete by: As directed      Allergies as of 02/02/2019      Reactions   Atorvastatin Other (See Comments)   Feels anxous   Cortisone Other (See Comments)   headache   Oxycodone Other (See Comments)   confusion   Paroxetine Other (See Comments)   anxiety   Penicillins Other (See Comments)   Unknown per pt   Pravastatin Other (See Comments)   muscle pain    Singulair [montelukast] Other (See Comments)   Chest pain   Sulfa Antibiotics Nausea Only   Aleve [naproxen Sodium] Rash   Augmentin [amoxicillin-pot Clavulanate] Rash   Prednisone Other (See Comments)   headaches      Medication List    TAKE these medications   Dexilant 60 MG capsule Generic drug: dexlansoprazole Take 60 mg by mouth daily.   DSS 100 MG Caps Take 100 mg by mouth 2 (two)  times daily. What changed:   when to take this  reasons to take this   enoxaparin 40 MG/0.4ML injection Commonly known as: LOVENOX Inject 0.4 mLs (40 mg total) into the skin daily.   fexofenadine 30 MG tablet Commonly known as: ALLEGRA Take 30 mg by mouth as needed (allergies).   HYDROcodone-acetaminophen 5-325 MG tablet Commonly known as: NORCO/VICODIN Take 1 tablet by mouth every 4 (four) hours as needed for moderate pain (pain score 4-6).   ketorolac 0.5 % ophthalmic solution Commonly known as: ACULAR Place 1 drop into the right eye 4 (four) times daily.   Myrbetriq 50 MG Tb24 tablet Generic drug: mirabegron ER Take 50 mg by mouth at bedtime.   ofloxacin 0.3 % ophthalmic solution Commonly known as: OCUFLOX Place 1 drop into the right eye 4 (four) times daily.   PARoxetine 20 MG tablet Commonly known as: PAXIL Take 20 mg by mouth every morning.   polyethylene glycol 17 g packet Commonly known as: MIRALAX / GLYCOLAX Take 17 g by mouth daily as needed.   Polytrim ophthalmic solution Generic drug:  trimethoprim-polymyxin b Place 1 drop into the right eye 4 (four) times daily.   prednisoLONE acetate 1 % ophthalmic suspension Commonly known as: PRED FORTE INSTILL 1 DROP INTO LEFT EYE TWICE DAILY What changed: See the new instructions.   propranolol ER 60 MG 24 hr capsule Commonly known as: INDERAL LA Take 60 mg by mouth daily.   rosuvastatin 5 MG tablet Commonly known as: CRESTOR Take 5 mg by mouth at bedtime.   Vitamin D (Ergocalciferol) 1.25 MG (50000 UT) Caps capsule Commonly known as: DRISDOL Take 50,000 Units by mouth every 7 (seven) days.       Contact information for follow-up providers    Swinteck, Arlys John, MD. Schedule an appointment as soon as possible for a visit in 2 weeks.   Specialty: Orthopedic Surgery Why: For wound re-check Contact information: [address] [place] [address] [phone]            Contact information for after-discharge care    Destination    [facility] .   Service: Skilled Nursing Contact information: [address] [place] [address] [phone]                   Time coordinating discharge: 28 minutes  Signed:  Lurene Shadow  [facility] 02/02/2019, 10:26 AM

## 2019-02-03 ENCOUNTER — Non-Acute Institutional Stay (SKILLED_NURSING_FACILITY): Payer: Medicare Other | Admitting: Internal Medicine

## 2019-02-03 ENCOUNTER — Encounter: Payer: Self-pay | Admitting: Internal Medicine

## 2019-02-03 DIAGNOSIS — S72002A Fracture of unspecified part of neck of left femur, initial encounter for closed fracture: Secondary | ICD-10-CM

## 2019-02-03 DIAGNOSIS — H44009 Unspecified purulent endophthalmitis, unspecified eye: Secondary | ICD-10-CM | POA: Insufficient documentation

## 2019-02-03 DIAGNOSIS — Z961 Presence of intraocular lens: Secondary | ICD-10-CM | POA: Diagnosis not present

## 2019-02-03 DIAGNOSIS — D649 Anemia, unspecified: Secondary | ICD-10-CM

## 2019-02-03 NOTE — Assessment & Plan Note (Signed)
Dr. Dairl Ponder office was contacted requesting verification of which topical eyedrop should be continued at the SNF.  Ophthalmologic follow-up 02/15/2019 is marked as cancelled

## 2019-02-03 NOTE — Assessment & Plan Note (Addendum)
Monitor for bleeding dyscrasias at SNF on Lovenox DVT prophylaxis.

## 2019-02-03 NOTE — Patient Instructions (Signed)
See assessment and plan under each diagnosis in the problem list and acutely for this visit 

## 2019-02-03 NOTE — Assessment & Plan Note (Addendum)
Orthopedic follow-up approximately 2 weeks post discharge. PT/OT at SNF.

## 2019-02-03 NOTE — Progress Notes (Signed)
NURSING HOME LOCATION:  [facility] ROOM NUMBER:  [id number]  CODE STATUS: Full code  PCP: Dr. Merlene Laughter  This is a comprehensive admission note to [facility] performed on this date less than 30 days from date of admission. Included are preadmission medical/surgical history; reconciled medication list; family history; social history and comprehensive review of systems.  Corrections and additions to the records were documented. Comprehensive physical exam was also performed. Additionally a clinical summary was entered for each active diagnosis pertinent to this admission in the Problem List to enhance continuity of care.  HPI: Patient was hospitalized 12/15-12/22/2020 with closed left hip [facility]  She was there for cataract surgery by Dr Vanessa Barbara and was reaching while sitting on the commode and fell striking her left temple.  This resulted in active bleeding around the L ear and the fracture.  She denied straining at urine or stool or attempting to arise from the commode.  She also denied any other cardio or neurologic prodrome phenomena. She was transported by ambulance from the surgical center to the hospital. Left intertrochanteric intramedullary nailing was completed 01/27/2019 by Dr. Leroy Libman. Postoperatively she developed acute blood loss anemia with a hemoglobin of 6.8 on 12/20, down from admission hemoglobin of 11.3 on 12/15.  She received 1 unit of packed cells and hemoglobin stabilized. PT/OT recommended rehab at the SNF. Orthopedic follow-up was to be approximately 2 weeks post discharge..  Past medical and surgical history: Includes history of TIA, vitamin D deficiency, osteoporosis, tremors, migraines, dyslipidemia, GERD, depression, CKD, allergic rhinitis, endophthalmitis & pseudophakia. Over 2 decades ago she had surgery on the left foot.  She is also had wrist fracture surgery and ORIF of the left patella.  Upper endoscopy  revealed esophagitis, hiatal hernia, and ulcer.  Social history: Nondrinker; never smoked.  Family history: Reviewed.  She states that her mother had Alzheimer's and father had a stroke.   Review of systems: She is alert and oriented and very loquacious. When she answers a question she will tend to go into extended background of remote history, both personal & familial. She has chronic constipation which has been aggravated by the opioids. She states that propranolol has been "a life-saving medication" for her as it prevents her migraines. She stated that she previously has had dyspepsia off Dexilant. 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  She went on to elaborate the health issues in her family and her husband's family.  Constitutional: No fever, significant weight change Eyes: No redness, discharge, pain, new vision change ENT/mouth: No nasal congestion, purulent discharge, earache, change in hearing, sore throat  Cardiovascular: No chest pain, palpitations, paroxysmal nocturnal dyspnea, claudication, edema  Respiratory: No cough, sputum production, hemoptysis, DOE, significant snoring, apnea Gastrointestinal: No dysphagia, abdominal pain, nausea /vomiting, rectal bleeding, melena Genitourinary: No dysuria, hematuria, pyuria, incontinence, nocturia Musculoskeletal: No joint stiffness, joint swelling, weakness Dermatologic: No rash, pruritus, change in appearance of skin Neurologic: No dizziness, headache, syncope, seizures, numbness, tingling Endocrine: No change in hair/skin/nails, excessive thirst, excessive hunger, excessive urination  Hematologic/lymphatic: No significant bruising, lymphadenopathy, abnormal bleeding Allergy/immunology: No itchy/watery eyes, significant sneezing, urticaria, angioedema  Physical exam:  Pertinent or positive findings: OS pupil is enlarged asymmetrically. Her cheeks are slightly sunken without definite associated facial muscular wasting.   The mandibular teeth are malaligned.  Abdomen is slightly protuberant.  Posterior tibial pulses are stronger than the dorsalis pedis pulses.  She has tremors of the upper  extremities which is worse with intentional activity.  She has scattered large keratoses over the posterior thorax.  General appearance: Adequately nourished; no acute distress, increased work of breathing is present.   Lymphatic: No lymphadenopathy about the head, neck, axilla. Eyes: No conjunctival inflammation or lid edema is present. There is no scleral icterus. Ears:  External ear exam shows no significant lesions or deformities.   Nose:  External nasal examination shows no deformity or inflammation. Nasal mucosa are pink and moist without lesions, exudates Oral exam: Lips and gums are healthy appearing.There is no oropharyngeal erythema or exudate. Neck:  No thyromegaly, masses, tenderness noted.    Heart:  Normal rate and regular rhythm. S1 and S2 normal without gallop, murmur, click, rub.  Lungs: Chest clear to auscultation without wheezes, rhonchi, rales, rubs. Abdomen: Bowel sounds are normal.  Abdomen is soft and nontender with no organomegaly, hernias, masses. GU: Deferred  Extremities:  No cyanosis, clubbing, edema. Neurologic exam: Balance, Rhomberg, finger to nose testing could not be completed due to clinical state Skin: Warm & dry w/o tenting. No significant rash.  See clinical summary under each active problem in the Problem List with associated updated therapeutic plan

## 2019-02-04 ENCOUNTER — Encounter: Payer: Self-pay | Admitting: Internal Medicine

## 2019-02-15 ENCOUNTER — Encounter (INDEPENDENT_AMBULATORY_CARE_PROVIDER_SITE_OTHER): Payer: Medicare Other | Admitting: Ophthalmology

## 2019-02-24 ENCOUNTER — Encounter: Payer: Self-pay | Admitting: Adult Health

## 2019-02-24 ENCOUNTER — Telehealth: Payer: Self-pay

## 2019-02-24 ENCOUNTER — Non-Acute Institutional Stay (SKILLED_NURSING_FACILITY): Payer: Medicare Other | Admitting: Adult Health

## 2019-02-24 DIAGNOSIS — E782 Mixed hyperlipidemia: Secondary | ICD-10-CM

## 2019-02-24 DIAGNOSIS — E559 Vitamin D deficiency, unspecified: Secondary | ICD-10-CM | POA: Insufficient documentation

## 2019-02-24 DIAGNOSIS — H44002 Unspecified purulent endophthalmitis, left eye: Secondary | ICD-10-CM | POA: Diagnosis not present

## 2019-02-24 DIAGNOSIS — R251 Tremor, unspecified: Secondary | ICD-10-CM | POA: Diagnosis not present

## 2019-02-24 DIAGNOSIS — S72002S Fracture of unspecified part of neck of left femur, sequela: Secondary | ICD-10-CM

## 2019-02-24 DIAGNOSIS — N3281 Overactive bladder: Secondary | ICD-10-CM | POA: Insufficient documentation

## 2019-02-24 DIAGNOSIS — J309 Allergic rhinitis, unspecified: Secondary | ICD-10-CM | POA: Insufficient documentation

## 2019-02-24 DIAGNOSIS — K219 Gastro-esophageal reflux disease without esophagitis: Secondary | ICD-10-CM

## 2019-02-24 DIAGNOSIS — D62 Acute posthemorrhagic anemia: Secondary | ICD-10-CM

## 2019-02-24 DIAGNOSIS — F410 Panic disorder [episodic paroxysmal anxiety] without agoraphobia: Secondary | ICD-10-CM

## 2019-02-24 NOTE — Telephone Encounter (Signed)
[facility] called and stated patient should NOT be taking ketoralac and ofloxacin eye drops.  These were prescribed for her eye surgery, but she fell before her surgery and she should not be taking them.  The prednisolone should be one drop to the left eye daily.  They are going to fax this information to [facility] office tomorrow and I'll get it to you.  She just wanted the drops stopped.  She said they had tried to contact [facility], but were not able to get anywhere.

## 2019-02-24 NOTE — Progress Notes (Signed)
Location:  [facility] Room Number: 223/A Place of Service:  SNF (31) Provider:  Kenard Gower, DNP, FNP-BC  Patient Care Team: Merlene Laughter, MD as PCP - General (Internal Medicine)  Extended Emergency Contact Information Primary Emergency Contact: Marc Morgans Address: [address]          [place], [place] [address] [place] Home Phone: [phone] Mobile Phone: [phone] Relation: Spouse  Code Status:  Full Code  Goals of care: Advanced Directive information Advanced Directives 02/24/2019  Does Patient Have a Medical Advance Directive? Yes  Type of Advance Directive (No Data)  Does patient want to make changes to medical advance directive? No - Patient declined  Would patient like information on creating a medical advance directive? -     Chief Complaint  Patient presents with  . Medical Management of Chronic Issues    Routine visit of medical management    HPI:  Pt is a 74 y.o. female seen today for medical management of chronic diseases. She has PMH of allergic rhinitis, depression, arthritis of the knee, GERD, hyperlipidemia, migraine headaches, essential tremors, osteoporosis, vitamin D deficiency and cataract in the right eye.She was seen in her room today. She reported that her husband has been trying to put a ramp at their house for her when she gets home. Left hip surgical incision has dry scabs and has no erythema. Dr. Cherlyn Cushing office has communicated that Ketorolac and Ofloxacin eye gtts needs to be discontinued since they were supposed to be given post right eye cataract extraction.   She was admitted to [facility] on 02/02/19 from hospitalization 01/26/19 to 02/02/19. She had a fall at the eye clinic and sustained a left hip fracture. Orthopedic surgeon, Dr. Linna Caprice was consulted and she underwent open reduction and intramedullary fixation of left femur on 01/27/19.  Hospitalization was complicated by acute blood loss anemia. Her hemoglobin dropped to 6.8 on 01/31/19 from 11.3 on admission 01/26/19. She was transfused 1 unit PRBC and hgb improved.  She has been admitted to [facility] for a short-term rehabilitation.   Past Medical History:  Diagnosis Date  . Allergic rhinitis   . Chronic kidney disease   . Depression   . DJD (degenerative joint disease) of knee   . GERD (gastroesophageal reflux disease)   . Hyperlipidemia   . Migraine    Takes Propranolol  . Neuromuscular disorder (HCC)    Body tremors at times,h/o falling cast right arm  . Osteoporosis   . Tremors of nervous system   . Vitamin D deficiency    Past Surgical History:  Procedure Laterality Date  . cataract surgery Left   . INTRAMEDULLARY (IM) NAIL INTERTROCHANTERIC Left 01/27/2019   Procedure: INTRAMEDULLARY (IM) NAIL INTERTROCHANTRIC;  Surgeon: Samson Frederic, MD;  Location: [facility] ORS;  Service: Orthopedics;  Laterality: Left;  . KNEE SURGERY    . Left Foot Surgery  20 years ago   20 years ago  . ORIF PATELLA Left 02/12/2014   Procedure: OPEN REDUCTION INTERNAL (ORIF) FIXATION LEFT PATELLA;  Surgeon: [name], MD;  Location: [facility] ORS;  Service: Orthopedics;  Laterality: Left;  . PARS PLANA VITRECTOMY Left 02/21/2017   Procedure: PARS PLANA VITRECTOMY 25 GAUGE FOR ENDOPHTHALMITIS;  Surgeon: [name], MD;  Location: [facility] OR;  Service: Ophthalmology;  Laterality: Left;  . WRIST FRACTURE SURGERY      Allergies  Allergen Reactions  . Atorvastatin Other (See Comments)    Feels anxous  . Cortisone  Other (See Comments)    headache  . Oxycodone Other (See Comments)    confusion  . Paroxetine Other (See Comments)    anxiety  . Penicillins Other (See Comments)    Unknown per pt  . Pravastatin Other (See Comments)    muscle pain   . Singulair [Montelukast] Other (See Comments)    Chest pain  . Sulfa Antibiotics Nausea Only  . Aleve [Naproxen Sodium] Rash  . Augmentin  [Amoxicillin-Pot Clavulanate] Rash  . Prednisone Other (See Comments)    headaches    Outpatient Encounter Medications as of 02/24/2019  Medication Sig  . dexlansoprazole (DEXILANT) 60 MG capsule Take 60 mg by mouth daily.  Marland Kitchen docusate sodium 100 MG CAPS Take 100 mg by mouth 2 (two) times daily.  Marland Kitchen enoxaparin (LOVENOX) 40 MG/0.4ML injection Inject 0.4 mLs (40 mg total) into the skin daily.  . fexofenadine (ALLEGRA) 30 MG tablet Take 30 mg by mouth as needed (allergies).   Marland Kitchen HYDROcodone-acetaminophen (NORCO/VICODIN) 5-325 MG tablet Take 1 tablet by mouth every 4 (four) hours as needed for moderate pain (pain score 4-6).  Marland Kitchen ketorolac (ACULAR) 0.5 % ophthalmic solution Place 1 drop into the right eye 4 (four) times daily.  Marland Kitchen MYRBETRIQ 50 MG TB24 tablet Take 50 mg by mouth at bedtime.  . Nutritional Supplements (ENSURE CLEAR PO) Take by mouth. Ensure Clear daily  . ofloxacin (OCUFLOX) 0.3 % ophthalmic solution Place 1 drop into the right eye 4 (four) times daily.  Marland Kitchen PARoxetine (PAXIL) 20 MG tablet Take 20 mg by mouth every morning.  . polyethylene glycol (MIRALAX / GLYCOLAX) 17 g packet Take 17 g by mouth daily as needed.  Marland Kitchen POLYTRIM ophthalmic solution Place 1 drop into the right eye 4 (four) times daily.  . prednisoLONE acetate (PRED FORTE) 1 % ophthalmic suspension Place 1 drop into the left eye 2 (two) times daily.  . propranolol ER (INDERAL LA) 60 MG 24 hr capsule Take 60 mg by mouth daily.  . rosuvastatin (CRESTOR) 5 MG tablet Take 5 mg by mouth at bedtime.   . Vitamin D, Ergocalciferol, (DRISDOL) 50000 units CAPS capsule Take 50,000 Units by mouth every 7 (seven) days.   . [DISCONTINUED] prednisoLONE acetate (PRED FORTE) 1 % ophthalmic suspension INSTILL 1 DROP INTO LEFT EYE TWICE DAILY  . [DISCONTINUED] propranolol ER (INDERAL LA) 60 MG 24 hr capsule Take 60 mg by mouth daily.   No facility-administered encounter medications on file as of 02/24/2019.    Review of Systems  GENERAL: No  change in appetite, no fatigue, no weight changes, no fever, chills or weakness MOUTH and THROAT: Denies oral discomfort, gingival pain or bleeding RESPIRATORY: no cough, SOB, DOE, wheezing, hemoptysis CARDIAC: No chest pain, edema or palpitations GI: No abdominal pain, diarrhea, constipation, heart burn, nausea or vomiting GU: Denies dysuria, frequency, hematuria, incontinence, or discharge NEUROLOGICAL: Denies dizziness, syncope, numbness, or headache PSYCHIATRIC: Denies feelings of depression or anxiety. No report of hallucinations, insomnia, paranoia, or agitation    There is no immunization history on file for this patient. Pertinent  Health Maintenance Due  Topic Date Due  . COLONOSCOPY  01/11/1995  . PNA vac Low Risk Adult (1 of 2 - PCV13) 01/10/2010  . INFLUENZA VACCINE  09/12/2018  . MAMMOGRAM  12/24/2020  . DEXA SCAN  Completed   No flowsheet data found.   Vitals:   02/24/19 1426  BP: 104/76  Pulse: 84  Resp: 18  Temp: 98.7 F (37.1 C)  TempSrc: Oral  SpO2: 98%  Weight: 164 lb (74.4 kg)  Height: 5\' 6"  (1.676 m)   Body mass index is 26.47 kg/m.  Physical Exam  GENERAL APPEARANCE: Well nourished. In no acute distress. Normal body habitus SKIN:  Left hip surgical incision is dry with scabs, no erythema MOUTH and THROAT: Lips are without lesions. Oral mucosa is moist and without lesions. Tongue is normal in shape, size, and color and without lesions RESPIRATORY: Breathing is even & unlabored, BS CTAB CARDIAC: RRR, no murmur,no extra heart sounds, no edema GI: Abdomen soft, normal BS, no masses, no tenderness EXTREMITIES:  Able to move X 4 extremities NEUROLOGICAL: There is no tremor. Speech is clear. Alert and oriented X 3. PSYCHIATRIC:  Affect and behavior are appropriate  Labs reviewed: Recent Labs    01/30/19 0559 01/31/19 0515 02/01/19 0520  NA 136 138 138  K 3.7 3.7 3.7  CL 106 106 108  CO2 23 26 24   GLUCOSE 95 98 98  BUN 13 12 10   CREATININE  0.77 0.79 0.74  CALCIUM 8.4* 8.2* 8.5*   Recent Labs    01/27/19 0545  AST 19  ALT 19  ALKPHOS 47  BILITOT 1.4*  PROT 5.8*  ALBUMIN 3.1*   Recent Labs    01/26/19 0931 01/30/19 0559 01/31/19 0515 01/31/19 1547 02/01/19 0520  WBC 7.1 6.9 6.6  --  6.2  NEUTROABS 4.4  --   --   --   --   HGB 11.3* 7.5* 6.8* 8.6* 8.5*  HCT 36.4 24.3* 22.2* 26.5* 27.3*  MCV 97.6 98.8 98.2  --  96.8  PLT 185 147* 185  --  206   No results found for: TSH Lab Results  Component Value Date   HGBA1C 5.8 (H) 05/02/2012   Lab Results  Component Value Date   CHOL 222 (H) 05/02/2012   HDL 78 05/02/2012   LDLCALC 133 (H) 05/02/2012   TRIG 54 05/02/2012   CHOLHDL 2.8 05/02/2012    Significant Diagnostic Results in last 30 days:  DG Chest 1 View  Result Date: 01/26/2019 CLINICAL DATA:  Transported by [facility] from [facility] where pt was about to have cataract surgery-- pt has a unwitnessed fall injuring LLE. +shortening, obvious deformity, swelling, pain. No prior injury to L hip. EXAM: CHEST  1 VIEW COMPARISON:  05/02/2012 FINDINGS: Cardiac silhouette is normal in size. No mediastinal or hilar masses. No evidence of adenopathy. Mild scarring noted at the lung apices. Lungs otherwise clear. No pleural effusion or pneumothorax. Skeletal structures are grossly intact. IMPRESSION: No acute cardiopulmonary disease. Electronically Signed   By: 01/28/2019 M.D.   On: 01/26/2019 10:11   CT Head Wo Contrast  Result Date: 01/26/2019 CLINICAL DATA:  Unwitnessed fall. EXAM: CT HEAD WITHOUT CONTRAST TECHNIQUE: Contiguous axial images were obtained from the base of the skull through the vertex without intravenous contrast. COMPARISON:  February 07, 2014. FINDINGS: Brain: Mild chronic ischemic white matter disease is noted. No mass effect or midline shift is noted. Ventricular size is within normal limits. There is no evidence of mass lesion, hemorrhage or acute infarction. Vascular: No  hyperdense vessel or unexpected calcification. Skull: Normal. Negative for fracture or focal lesion. Sinuses/Orbits: No acute finding. Other: None. IMPRESSION: Mild chronic ischemic white matter disease. No acute intracranial abnormality seen. Electronically Signed   By: 01/28/2019 M.D.   On: 01/26/2019 10:21   Pelvis Portable  Result Date: 01/27/2019 CLINICAL DATA:  Status post left intramedullary rod fixation.  EXAM: PORTABLE PELVIS 1-2 VIEWS COMPARISON:  January 26, 2019. FINDINGS: Status post surgical internal fixation and intramedullary rod fixation of intertrochanteric fracture of proximal left femur. Significantly improved alignment of fracture components is noted. Expected postoperative changes are seen in the surrounding soft tissues. IMPRESSION: Status post surgical internal fixation of intertrochanteric fracture of proximal left femur. Significantly improved alignment of fracture components is noted. Electronically Signed   By: Lupita RaiderJames  Green Jr M.D.   On: 01/27/2019 15:33   DG Knee Complete 4 Views Left  Result Date: 01/26/2019 CLINICAL DATA:  Prior left hip fracture. Insert EXAM: LEFT KNEE - COMPLETE 4+ VIEW COMPARISON:  05/20/2017 images, report not available. FINDINGS: Lateral view is inverted. AP and oblique views are not centered. Surgical screws noted in the distal femur appear to be intact. Diffuse osteopenia and degenerative change. No acute bony abnormality identified. IMPRESSION: Limited exam. No acute bony or joint abnormality identified. Surgical screws in the distal left femur intact. Diffuse degenerative change. Electronically Signed   By: Maisie Fushomas  Register   On: 01/26/2019 14:36   DG C-Arm 1-60 Min-No Report  Result Date: 01/27/2019 Fluoroscopy was utilized by the requesting physician.  No radiographic interpretation.   ECHOCARDIOGRAM COMPLETE  Result Date: 01/26/2019   ECHOCARDIOGRAM REPORT   Patient Name:   Jenell MillinerBRENDA L Gehret Date of Exam: 01/26/2019 Medical Rec #:   [id number]         Height:       66.0 in Accession #:    [id number]        Weight:       166.0 lb Date of Birth:  12/25/1945        BSA:          1.85 m Patient Age:    73 years          BP:           135/83 mmHg Patient Gender: F                 HR:           70 bpm. Exam Location:  Inpatient Procedure: 2D Echo Indications:    syncope 780.2  History:        Patient has prior history of Echocardiogram examinations, most                 recent 05/02/2012. Risk Factors:Dyslipidemia.  Sonographer:    Celene SkeenVijay Shankar RDCS (AE) Referring Phys: [id number] DAVID MANUEL ORTIZ  Sonographer Comments: restricted mobility IMPRESSIONS  1. Left ventricular ejection fraction, by visual estimation, is 60 to 65%. The left ventricle has normal function. There is no left ventricular hypertrophy.  2. The left ventricle has no regional wall motion abnormalities.  3. Global right ventricle has normal systolic function.The right ventricular size is normal. No increase in right ventricular wall thickness.  4. Left atrial size was normal.  5. Right atrial size was normal.  6. The mitral valve is normal in structure. No evidence of mitral valve regurgitation. No evidence of mitral stenosis.  7. The tricuspid valve is normal in structure. Tricuspid valve regurgitation is not demonstrated.  8. The aortic valve is normal in structure. Aortic valve regurgitation is not visualized. No evidence of aortic valve sclerosis or stenosis.  9. The pulmonic valve was normal in structure. Pulmonic valve regurgitation is not visualized. 10. The inferior vena cava is normal in size with greater than 50% respiratory variability, suggesting right atrial pressure of 3 mmHg. FINDINGS  Left Ventricle:  Left ventricular ejection fraction, by visual estimation, is 60 to 65%. The left ventricle has normal function. The left ventricle has no regional wall motion abnormalities. There is no left ventricular hypertrophy. Left ventricular diastolic parameters were normal.  Indeterminate filling pressures. Right Ventricle: The right ventricular size is normal. No increase in right ventricular wall thickness. Global RV systolic function is has normal systolic function. Left Atrium: Left atrial size was normal in size. Right Atrium: Right atrial size was normal in size Pericardium: There is no evidence of pericardial effusion. Mitral Valve: The mitral valve is normal in structure. No evidence of mitral valve regurgitation. No evidence of mitral valve stenosis by observation. Tricuspid Valve: The tricuspid valve is normal in structure. Tricuspid valve regurgitation is not demonstrated. Aortic Valve: The aortic valve is normal in structure. Aortic valve regurgitation is not visualized. The aortic valve is structurally normal, with no evidence of sclerosis or stenosis. Pulmonic Valve: The pulmonic valve was normal in structure. Pulmonic valve regurgitation is not visualized. Pulmonic regurgitation is not visualized. Aorta: The aortic root, ascending aorta and aortic arch are all structurally normal, with no evidence of dilitation or obstruction. Venous: The inferior vena cava is normal in size with greater than 50% respiratory variability, suggesting right atrial pressure of 3 mmHg. IAS/Shunts: No atrial level shunt detected by color flow Doppler. There is no evidence of a patent foramen ovale. No ventricular septal defect is seen or detected. There is no evidence of an atrial septal defect.  LEFT VENTRICLE PLAX 2D LVIDd:         3.50 cm  Diastology LVIDs:         2.20 cm  LV e' lateral:   7.62 cm/s LV PW:         0.90 cm  LV E/e' lateral: 9.6 LV IVS:        0.70 cm  LV e' medial:    6.85 cm/s LVOT diam:     2.00 cm  LV E/e' medial:  10.7 LV SV:         35 ml LV SV Index:   18.36 LVOT Area:     3.14 cm  RIGHT VENTRICLE RV S prime:     11.20 cm/s TAPSE (M-mode): 1.6 cm LEFT ATRIUM             Index       RIGHT ATRIUM           Index LA diam:        3.70 cm 2.00 cm/m  RA Area:     12.80 cm  LA Vol (A2C):   23.9 ml 12.94 ml/m RA Volume:   29.50 ml  15.97 ml/m LA Vol (A4C):   25.4 ml 13.75 ml/m LA Biplane Vol: 25.4 ml 13.75 ml/m  AORTIC VALVE LVOT Vmax:   95.30 cm/s LVOT Vmean:  57.500 cm/s LVOT VTI:    0.171 m  AORTA Ao Root diam: 2.60 cm MITRAL VALVE MV Area (PHT): 2.96 cm             SHUNTS MV PHT:        74.24 msec           Systemic VTI:  0.17 m MV Decel Time: 256 msec             Systemic Diam: 2.00 cm MV E velocity: 73.30 cm/s 103 cm/s MV A velocity: 77.60 cm/s 70.3 cm/s MV E/A ratio:  0.94       1.5  Mihai  Croitoru MD Electronically signed by Sanda Klein MD Signature Date/Time: 01/26/2019/3:55:50 PM    Final    DG HIP OPERATIVE UNILAT W OR W/O PELVIS LEFT  Result Date: 01/27/2019 CLINICAL DATA:  Hip fracture repair FLUOROSCOPY TIME:  1 minutes 57 seconds. Images: 7 EXAM: OPERATIVE LEFT HIP (WITH PELVIS IF PERFORMED) 7 VIEWS TECHNIQUE: Fluoroscopic spot image(s) were submitted for interpretation post-operatively. COMPARISON:  None. FINDINGS: The patient's left hip fracture is repaired throughout the study with a gamma nail and intramedullary femoral rod, affixed distally with a single interlocking screw. Hardware is in good position. IMPRESSION: Left hip fracture repair as above. Electronically Signed   By: Dorise Bullion III M.D   On: 01/27/2019 14:13   DG Hip Unilat With Pelvis 2-3 Views Left  Result Date: 01/26/2019 CLINICAL DATA:  Transported by [facility] from [place] where pt was about to have cataract surgery-- pt has a unwitnessed fall injuring LLE. +shortening, obvious deformity, swelling, pain. No prior injury to L hip. EXAM: DG HIP (WITH OR WITHOUT PELVIS) 2-3V LEFT COMPARISON:  None. FINDINGS: Intertrochanteric fracture of the proximal left femur. A fracture line crosses base of the femoral neck with another extending superior to the lesser trochanter to the lateral proximal femoral metaphysis. Proximal fracture component is displaced laterally by  1.7 cm, with mild varus angulation. No other fractures.  Hip joints normally spaced and aligned. IMPRESSION: 1. Mildly displaced and varus angulated intertrochanteric fracture of the proximal left femur. No dislocation. Electronically Signed   By: Lajean Manes M.D.   On: 01/26/2019 10:13   DG FEMUR PORT MIN 2 VIEWS LEFT  Result Date: 01/27/2019 CLINICAL DATA:  Status post left intramedullary rod femur fixation. EXAM: LEFT FEMUR PORTABLE 2 VIEWS COMPARISON:  January 26, 2019. FINDINGS: Status post surgical internal fixation and intramedullary rod fixation of left femur for treatment of intertrochanteric fracture of proximal left femur. Improved alignment of fracture components is noted. Expected postoperative changes are seen in the surrounding soft tissues. IMPRESSION: Status post surgical internal fixation of intertrochanteric fracture of proximal left femur. Improved alignment of fracture components is noted. Expected postoperative changes are noted. Electronically Signed   By: Marijo Conception M.D.   On: 01/27/2019 15:34    Assessment/Plan  1. Closed fracture of left hip, sequela - S/P open reduction and intramedullary fixation of left femur on 01/27/2019, continue Lovenox for DVT prophylaxis with stop date 02/28/2019  -Pain is well controlled, will decrease Norco 5-325 mg from every 4 hours as needed to every 8 hours as needed -Continue PT and OT for therapeutic and strengthening exercises, fall precautions  2. Tremors of nervous system -Continue propanolol ER 60 mg 1 capsule daily  3. Mixed hyperlipidemia Lab Results  Component Value Date   CHOL 222 (H) 05/02/2012   HDL 78 05/02/2012   LDLCALC 133 (H) 05/02/2012   TRIG 54 05/02/2012   CHOLHDL 2.8 05/02/2012  -Continue rosuvastatin 5 mg 1 tab at bedtime  4. Left endophthalmia -Continue polymyxin eyedrops and prednisolone eyedrops, follow-up with ophthalmologist  5. Anxiety attack -Mood is stable, continue paroxetine 20 mg  daily  6. Acute blood loss anemia - S/P transfusion of 1 unit packed RBC Lab Results  Component Value Date   HGB 8.5 (L) 02/01/2019  - will re-check CBC  7. Vitamin D deficiency -Continue vitamin D2 50,000 units weekly  8. OAB (overactive bladder) -Continue Myrbetriq ER 50 mg 1 tab at bedtime  9. Gastroesophageal reflux disease without esophagitis -Stable, continue  Dexilant DR 60 mg 1 capsule daily  10. Allergic rhinitis, unspecified seasonality, unspecified trigger -Stable, continue fexofenadine PRN      Family/ staff Communication:  Discussed plan of care with resident.  Labs/tests ordered: CBC  Goals of care:   Short-term care   Kenard GowerMonina Medina-Vargas, DNP, FNP-BC [facility] [phone] (Monday-Friday 8:00 a.m. - 5:00 p.m.) [phone] (after hours)

## 2019-02-25 ENCOUNTER — Encounter: Payer: Self-pay | Admitting: Adult Health

## 2019-02-25 ENCOUNTER — Non-Acute Institutional Stay (SKILLED_NURSING_FACILITY): Payer: Medicare Other | Admitting: Adult Health

## 2019-02-25 DIAGNOSIS — E782 Mixed hyperlipidemia: Secondary | ICD-10-CM

## 2019-02-25 DIAGNOSIS — H44002 Unspecified purulent endophthalmitis, left eye: Secondary | ICD-10-CM | POA: Diagnosis not present

## 2019-02-25 DIAGNOSIS — K219 Gastro-esophageal reflux disease without esophagitis: Secondary | ICD-10-CM

## 2019-02-25 DIAGNOSIS — D62 Acute posthemorrhagic anemia: Secondary | ICD-10-CM

## 2019-02-25 DIAGNOSIS — F339 Major depressive disorder, recurrent, unspecified: Secondary | ICD-10-CM

## 2019-02-25 DIAGNOSIS — E559 Vitamin D deficiency, unspecified: Secondary | ICD-10-CM

## 2019-02-25 DIAGNOSIS — N3281 Overactive bladder: Secondary | ICD-10-CM

## 2019-02-25 DIAGNOSIS — R251 Tremor, unspecified: Secondary | ICD-10-CM

## 2019-02-25 DIAGNOSIS — S72002S Fracture of unspecified part of neck of left femur, sequela: Secondary | ICD-10-CM

## 2019-02-25 MED ORDER — POLYTRIM 10000-0.1 UNIT/ML-% OP SOLN: 1.0000 [drp] | Freq: Four times a day (QID) | OPHTHALMIC | 0 refills | Status: DC

## 2019-02-25 MED ORDER — HYDROCODONE-ACETAMINOPHEN 5-325 MG PO TABS: 1.0000 | ORAL_TABLET | Freq: Three times a day (TID) | ORAL | 0 refills | Status: DC | PRN

## 2019-02-25 MED ORDER — PAROXETINE HCL 20 MG PO TABS: 20.0000 mg | ORAL_TABLET | Freq: Every morning | ORAL | 0 refills | Status: AC

## 2019-02-25 MED ORDER — DEXILANT 60 MG PO CPDR: 60.0000 mg | DELAYED_RELEASE_CAPSULE | Freq: Every day | ORAL | 0 refills | Status: DC

## 2019-02-25 MED ORDER — VITAMIN D (ERGOCALCIFEROL) 1.25 MG (50000 UNIT) PO CAPS: 50000.0000 [IU] | ORAL_CAPSULE | ORAL | 0 refills | Status: AC

## 2019-02-25 MED ORDER — PREDNISOLONE ACETATE 1 % OP SUSP: 1.0000 [drp] | Freq: Every day | OPHTHALMIC | 0 refills | Status: DC

## 2019-02-25 MED ORDER — ROSUVASTATIN CALCIUM 5 MG PO TABS: 5.0000 mg | ORAL_TABLET | Freq: Every day | ORAL | 0 refills | Status: AC

## 2019-02-25 MED ORDER — PROPRANOLOL HCL ER 60 MG PO CP24: 60.0000 mg | ORAL_CAPSULE | Freq: Every day | ORAL | 0 refills | Status: AC

## 2019-02-25 MED ORDER — MYRBETRIQ 50 MG PO TB24: 50.0000 mg | ORAL_TABLET | Freq: Every day | ORAL | 0 refills | Status: AC

## 2019-02-25 MED ORDER — ENOXAPARIN SODIUM 40 MG/0.4ML ~~LOC~~ SOLN: 40.0000 mg | SUBCUTANEOUS | 0 refills | Status: DC

## 2019-02-25 NOTE — Progress Notes (Signed)
Location:  [facility] Room Number: 223/A Place of Service:  SNF (31) Provider:  Kenard GowerMedina-Vargas, Evelise Reine, DNP, FNP-BC  Patient Care Team: Merlene LaughterStoneking, Hal, MD as PCP - General (Internal Medicine)  Extended Emergency Contact Information Primary Emergency Contact: Marc Morgansoberson,Walter Kenneth Address: [address]          [place], [place] [address] [place] Home Phone: [phone] Mobile Phone: [phone] Relation: Spouse  Code Status:  Full Code  Goals of care: Advanced Directive information Advanced Directives 02/25/2019  Does Patient Have a Medical Advance Directive? Yes  Type of Advance Directive (No Data)  Does patient want to make changes to medical advance directive? No - Patient declined  Would patient like information on creating a medical advance directive? -     Chief Complaint  Patient presents with   Discharge Note    Discharge Visit    HPI:  Pt is a 74 y.o. female who is for discharge home  On 02/27/19 with Home health PT and OT.  She was admitted to [facility] on 02/02/19 from hospitalization 01/26/19 to 02/02/19. She had a fall at the eye clinic and sustained a left hip fracture. Orthopedic surgeon, Dr. Linna CapriceSwinteck, was consulted and she underwent open reduction and intramedullary fixation of left femur on 01/27/19. Hospitalization was complicated by acute blood loss anemia. Her hgb dropped to 6.8 on 01/31/19 from 11.3 on admission 01/26/19. She was transfused 1 unit pRBC and hgb improved.   Patient was admitted to this facility for short-term rehabilitation after the patient's recent hospitalization.  Patient has completed SNF rehabilitation and therapy has cleared the patient for discharge.   Past Medical History:  Diagnosis Date   Allergic rhinitis    Chronic kidney disease    Depression    DJD (degenerative joint disease) of knee    GERD (gastroesophageal reflux disease)    Hyperlipidemia     Migraine    Takes Propranolol   Neuromuscular disorder (HCC)    Body tremors at times,h/o falling cast right arm   Osteoporosis    Tremors of nervous system    Vitamin D deficiency    Past Surgical History:  Procedure Laterality Date   cataract surgery Left    INTRAMEDULLARY (IM) NAIL INTERTROCHANTERIC Left 01/27/2019   Procedure: INTRAMEDULLARY (IM) NAIL INTERTROCHANTRIC;  Surgeon: Samson FredericSwinteck, Brian, MD;  Location: [facility] ORS;  Service: Orthopedics;  Laterality: Left;   KNEE SURGERY     Left Foot Surgery  20 years ago   20 years ago   ORIF PATELLA Left 02/12/2014   Procedure: OPEN REDUCTION INTERNAL (ORIF) FIXATION LEFT PATELLA;  Surgeon: Shelda PalMatthew D Olin, MD;  Location: [facility] ORS;  Service: Orthopedics;  Laterality: Left;   PARS PLANA VITRECTOMY Left 02/21/2017   Procedure: PARS PLANA VITRECTOMY 25 GAUGE FOR ENDOPHTHALMITIS;  Surgeon: Rennis ChrisZamora, Brian, MD;  [facility];  Service: Ophthalmology;  Laterality: Left;   WRIST FRACTURE SURGERY      Allergies  Allergen Reactions   Atorvastatin Other (See Comments)    Feels anxous   Cortisone Other (See Comments)    headache   Oxycodone Other (See Comments)    confusion   Paroxetine Other (See Comments)    anxiety   Penicillins Other (See Comments)    Unknown per pt   Pravastatin Other (See Comments)    muscle pain    Singulair [Montelukast] Other (See Comments)    Chest pain   Sulfa Antibiotics Nausea Only   Aleve [Naproxen Sodium]  Rash   Augmentin [Amoxicillin-Pot Clavulanate] Rash   Prednisone Other (See Comments)    headaches    Outpatient Encounter Medications as of 02/25/2019  Medication Sig   dexlansoprazole (DEXILANT) 60 MG capsule Take 60 mg by mouth daily.   docusate sodium 100 MG CAPS Take 100 mg by mouth 2 (two) times daily.   enoxaparin (LOVENOX) 40 MG/0.4ML injection Inject 0.4 mLs (40 mg total) into the skin daily.   fexofenadine (ALLEGRA) 30 MG tablet Take 30 mg by mouth as needed (allergies).     HYDROcodone-acetaminophen (NORCO/VICODIN) 5-325 MG tablet Take 1 tablet by mouth every 8 (eight) hours as needed for moderate pain.   MYRBETRIQ 50 MG TB24 tablet Take 50 mg by mouth at bedtime.   Nutritional Supplements (ENSURE CLEAR PO) Take by mouth. Ensure Clear daily   PARoxetine (PAXIL) 20 MG tablet Take 20 mg by mouth every morning.   polyethylene glycol (MIRALAX / GLYCOLAX) 17 g packet Take 17 g by mouth daily as needed.   POLYTRIM ophthalmic solution Place 1 drop into the right eye 4 (four) times daily.   prednisoLONE acetate (PRED FORTE) 1 % ophthalmic suspension Place 1 drop into the left eye daily.    propranolol ER (INDERAL LA) 60 MG 24 hr capsule Take 60 mg by mouth daily.   rosuvastatin (CRESTOR) 5 MG tablet Take 5 mg by mouth at bedtime.    Vitamin D, Ergocalciferol, (DRISDOL) 50000 units CAPS capsule Take 50,000 Units by mouth every 7 (seven) days.    [DISCONTINUED] HYDROcodone-acetaminophen (NORCO/VICODIN) 5-325 MG tablet Take 1 tablet by mouth every 4 (four) hours as needed for moderate pain (pain score 4-6). (Patient taking differently: Take 1 tablet by mouth every 8 (eight) hours as needed for moderate pain (pain score 4-6). )   [DISCONTINUED] ketorolac (ACULAR) 0.5 % ophthalmic solution Place 1 drop into the right eye 4 (four) times daily.   [DISCONTINUED] ofloxacin (OCUFLOX) 0.3 % ophthalmic solution Place 1 drop into the right eye 4 (four) times daily.   No facility-administered encounter medications on file as of 02/25/2019.    Review of Systems  GENERAL: No change in appetite, no fatigue, no weight changes, no fever, chills or weakness MOUTH and THROAT: Denies oral discomfort, gingival pain or bleeding, pain from teeth or hoarseness   RESPIRATORY: no cough, SOB, DOE, wheezing, hemoptysis CARDIAC: No chest pain, edema or palpitations GI: No abdominal pain, diarrhea, constipation, heart burn, nausea or vomiting GU: Denies dysuria, frequency, hematuria,  incontinence, or discharge NEUROLOGICAL: Denies dizziness, syncope, numbness, or headache PSYCHIATRIC: Denies feelings of depression or anxiety. No report of hallucinations, insomnia, paranoia, or agitation    There is no immunization history on file for this patient. Pertinent  Health Maintenance Due  Topic Date Due   COLONOSCOPY  01/11/1995   PNA vac Low Risk Adult (1 of 2 - PCV13) 01/10/2010   INFLUENZA VACCINE  09/12/2018   MAMMOGRAM  12/24/2020   DEXA SCAN  Completed   No flowsheet data found.   Vitals:   02/25/19 0935  BP: 103/71  Pulse: 76  Resp: 20  Temp: (!) 97.3 F (36.3 C)  TempSrc: Oral  SpO2: 98%  Weight: 164 lb (74.4 kg)  Height: 5\' 6"  (1.676 m)   Body mass index is 26.47 kg/m.  Physical Exam  GENERAL APPEARANCE: Well nourished. In no acute distress. Normal body habitus SKIN:  Left hip surgical incision dry, has scabs, no erythema MOUTH and THROAT: Lips are without lesions. Oral mucosa is moist  and without lesions. Tongue is normal in shape, size, and color and without lesions RESPIRATORY: Breathing is even & unlabored, BS CTAB CARDIAC: RRR, no murmur,no extra heart sounds, no edema GI: Abdomen soft, normal BS, no masses, no tenderness EXTREMITIES:  Able to move X 4 extremities NEUROLOGICAL: There is no tremor. Speech is clear. Alert and oriented X 3. PSYCHIATRIC:  Affect and behavior are appropriate  Labs reviewed: Recent Labs    01/30/19 0559 01/31/19 0515 02/01/19 0520  NA 136 138 138  K 3.7 3.7 3.7  CL 106 106 108  CO2 23 26 24   GLUCOSE 95 98 98  BUN 13 12 10   CREATININE 0.77 0.79 0.74  CALCIUM 8.4* 8.2* 8.5*   Recent Labs    01/27/19 0545  AST 19  ALT 19  ALKPHOS 47  BILITOT 1.4*  PROT 5.8*  ALBUMIN 3.1*   Recent Labs    01/26/19 0931 01/27/19 0545 01/30/19 0559 01/30/19 0559 01/31/19 0515 01/31/19 1547 02/01/19 0520  WBC 7.1   < > 6.9  --  6.6  --  6.2  NEUTROABS 4.4  --   --   --   --   --   --   HGB 11.3*   <  > 7.5*   < > 6.8* 8.6* 8.5*  HCT 36.4   < > 24.3*   < > 22.2* 26.5* 27.3*  MCV 97.6   < > 98.8  --  98.2  --  96.8  PLT 185   < > 147*  --  185  --  206   < > = values in this interval not displayed.   No results found for: TSH Lab Results  Component Value Date   HGBA1C 5.8 (H) 05/02/2012   Lab Results  Component Value Date   CHOL 222 (H) 05/02/2012   HDL 78 05/02/2012   LDLCALC 133 (H) 05/02/2012   TRIG 54 05/02/2012   CHOLHDL 2.8 05/02/2012    Significant Diagnostic Results in last 30 days:  DG Chest 1 View  Result Date: 01/26/2019 CLINICAL DATA:  Transported by [facility] from [place] where pt was about to have cataract surgery-- pt has a unwitnessed fall injuring LLE. +shortening, obvious deformity, swelling, pain. No prior injury to L hip. EXAM: CHEST  1 VIEW COMPARISON:  05/02/2012 FINDINGS: Cardiac silhouette is normal in size. No mediastinal or hilar masses. No evidence of adenopathy. Mild scarring noted at the lung apices. Lungs otherwise clear. No pleural effusion or pneumothorax. Skeletal structures are grossly intact. IMPRESSION: No acute cardiopulmonary disease. Electronically Signed   By: Lajean Manes M.D.   On: 01/26/2019 10:11   CT Head Wo Contrast  Result Date: 01/26/2019 CLINICAL DATA:  Unwitnessed fall. EXAM: CT HEAD WITHOUT CONTRAST TECHNIQUE: Contiguous axial images were obtained from the base of the skull through the vertex without intravenous contrast. COMPARISON:  February 07, 2014. FINDINGS: Brain: Mild chronic ischemic white matter disease is noted. No mass effect or midline shift is noted. Ventricular size is within normal limits. There is no evidence of mass lesion, hemorrhage or acute infarction. Vascular: No hyperdense vessel or unexpected calcification. Skull: Normal. Negative for fracture or focal lesion. Sinuses/Orbits: No acute finding. Other: None. IMPRESSION: Mild chronic ischemic white matter disease. No acute intracranial  abnormality seen. Electronically Signed   By: Marijo Conception M.D.   On: 01/26/2019 10:21   Pelvis Portable  Result Date: 01/27/2019 CLINICAL DATA:  Status post left intramedullary rod fixation. EXAM: PORTABLE  PELVIS 1-2 VIEWS COMPARISON:  January 26, 2019. FINDINGS: Status post surgical internal fixation and intramedullary rod fixation of intertrochanteric fracture of proximal left femur. Significantly improved alignment of fracture components is noted. Expected postoperative changes are seen in the surrounding soft tissues. IMPRESSION: Status post surgical internal fixation of intertrochanteric fracture of proximal left femur. Significantly improved alignment of fracture components is noted. Electronically Signed   By: Lupita Raider M.D.   On: 01/27/2019 15:33   DG Knee Complete 4 Views Left  Result Date: 01/26/2019 CLINICAL DATA:  Prior left hip fracture. Insert EXAM: LEFT KNEE - COMPLETE 4+ VIEW COMPARISON:  05/20/2017 images, report not available. FINDINGS: Lateral view is inverted. AP and oblique views are not centered. Surgical screws noted in the distal femur appear to be intact. Diffuse osteopenia and degenerative change. No acute bony abnormality identified. IMPRESSION: Limited exam. No acute bony or joint abnormality identified. Surgical screws in the distal left femur intact. Diffuse degenerative change. Electronically Signed   By: Maisie Fus  Register   On: 01/26/2019 14:36   DG C-Arm 1-60 Min-No Report  Result Date: 01/27/2019 Fluoroscopy was utilized by the requesting physician.  No radiographic interpretation.   ECHOCARDIOGRAM COMPLETE  Result Date: 01/26/2019   ECHOCARDIOGRAM REPORT   Patient Name:   Jennifer Estrada Date of Exam: 01/26/2019 Medical Rec #:  [id number]         Height:       66.0 in Accession #:    [id number]        Weight:       166.0 lb Date of Birth:  1945-11-17        BSA:          1.85 m Patient Age:    73 years          BP:           135/83 mmHg Patient  Gender: F                 HR:           70 bpm. Exam Location:  Inpatient Procedure: 2D Echo Indications:    syncope 780.2  History:        Patient has prior history of Echocardiogram examinations, most                 recent 05/02/2012. Risk Factors:Dyslipidemia.  Sonographer:    Celene Skeen RDCS (AE) Referring Phys: [id number] DAVID MANUEL ORTIZ  Sonographer Comments: restricted mobility IMPRESSIONS  1. Left ventricular ejection fraction, by visual estimation, is 60 to 65%. The left ventricle has normal function. There is no left ventricular hypertrophy.  2. The left ventricle has no regional wall motion abnormalities.  3. Global right ventricle has normal systolic function.The right ventricular size is normal. No increase in right ventricular wall thickness.  4. Left atrial size was normal.  5. Right atrial size was normal.  6. The mitral valve is normal in structure. No evidence of mitral valve regurgitation. No evidence of mitral stenosis.  7. The tricuspid valve is normal in structure. Tricuspid valve regurgitation is not demonstrated.  8. The aortic valve is normal in structure. Aortic valve regurgitation is not visualized. No evidence of aortic valve sclerosis or stenosis.  9. The pulmonic valve was normal in structure. Pulmonic valve regurgitation is not visualized. 10. The inferior vena cava is normal in size with greater than 50% respiratory variability, suggesting right atrial pressure of 3 mmHg. FINDINGS  Left Ventricle: Left ventricular  ejection fraction, by visual estimation, is 60 to 65%. The left ventricle has normal function. The left ventricle has no regional wall motion abnormalities. There is no left ventricular hypertrophy. Left ventricular diastolic parameters were normal. Indeterminate filling pressures. Right Ventricle: The right ventricular size is normal. No increase in right ventricular wall thickness. Global RV systolic function is has normal systolic function. Left Atrium: Left atrial  size was normal in size. Right Atrium: Right atrial size was normal in size Pericardium: There is no evidence of pericardial effusion. Mitral Valve: The mitral valve is normal in structure. No evidence of mitral valve regurgitation. No evidence of mitral valve stenosis by observation. Tricuspid Valve: The tricuspid valve is normal in structure. Tricuspid valve regurgitation is not demonstrated. Aortic Valve: The aortic valve is normal in structure. Aortic valve regurgitation is not visualized. The aortic valve is structurally normal, with no evidence of sclerosis or stenosis. Pulmonic Valve: The pulmonic valve was normal in structure. Pulmonic valve regurgitation is not visualized. Pulmonic regurgitation is not visualized. Aorta: The aortic root, ascending aorta and aortic arch are all structurally normal, with no evidence of dilitation or obstruction. Venous: The inferior vena cava is normal in size with greater than 50% respiratory variability, suggesting right atrial pressure of 3 mmHg. IAS/Shunts: No atrial level shunt detected by color flow Doppler. There is no evidence of a patent foramen ovale. No ventricular septal defect is seen or detected. There is no evidence of an atrial septal defect.  LEFT VENTRICLE PLAX 2D LVIDd:         3.50 cm  Diastology LVIDs:         2.20 cm  LV e' lateral:   7.62 cm/s LV PW:         0.90 cm  LV E/e' lateral: 9.6 LV IVS:        0.70 cm  LV e' medial:    6.85 cm/s LVOT diam:     2.00 cm  LV E/e' medial:  10.7 LV SV:         35 ml LV SV Index:   18.36 LVOT Area:     3.14 cm  RIGHT VENTRICLE RV S prime:     11.20 cm/s TAPSE (M-mode): 1.6 cm LEFT ATRIUM             Index       RIGHT ATRIUM           Index LA diam:        3.70 cm 2.00 cm/m  RA Area:     12.80 cm LA Vol (A2C):   23.9 ml 12.94 ml/m RA Volume:   29.50 ml  15.97 ml/m LA Vol (A4C):   25.4 ml 13.75 ml/m LA Biplane Vol: 25.4 ml 13.75 ml/m  AORTIC VALVE LVOT Vmax:   95.30 cm/s LVOT Vmean:  57.500 cm/s LVOT VTI:     0.171 m  AORTA Ao Root diam: 2.60 cm MITRAL VALVE MV Area (PHT): 2.96 cm             SHUNTS MV PHT:        74.24 msec           Systemic VTI:  0.17 m MV Decel Time: 256 msec             Systemic Diam: 2.00 cm MV E velocity: 73.30 cm/s 103 cm/s MV A velocity: 77.60 cm/s 70.3 cm/s MV E/A ratio:  0.94       1.5  Mihai Croitoru MD  Electronically signed by Thurmon Fair MD Signature Date/Time: 01/26/2019/3:55:50 PM    Final    DG HIP OPERATIVE UNILAT W OR W/O PELVIS LEFT  Result Date: 01/27/2019 CLINICAL DATA:  Hip fracture repair FLUOROSCOPY TIME:  1 minutes 57 seconds. Images: 7 EXAM: OPERATIVE LEFT HIP (WITH PELVIS IF PERFORMED) 7 VIEWS TECHNIQUE: Fluoroscopic spot image(s) were submitted for interpretation post-operatively. COMPARISON:  None. FINDINGS: The patient's left hip fracture is repaired throughout the study with a gamma nail and intramedullary femoral rod, affixed distally with a single interlocking screw. Hardware is in good position. IMPRESSION: Left hip fracture repair as above. Electronically Signed   By: Gerome Sam III M.D   On: 01/27/2019 14:13   DG Hip Unilat With Pelvis 2-3 Views Left  Result Date: 01/26/2019 CLINICAL DATA:  Transported by [facility] from [facility] where pt was about to have cataract surgery-- pt has a unwitnessed fall injuring LLE. +shortening, obvious deformity, swelling, pain. No prior injury to L hip. EXAM: DG HIP (WITH OR WITHOUT PELVIS) 2-3V LEFT COMPARISON:  None. FINDINGS: Intertrochanteric fracture of the proximal left femur. A fracture line crosses base of the femoral neck with another extending superior to the lesser trochanter to the lateral proximal femoral metaphysis. Proximal fracture component is displaced laterally by 1.7 cm, with mild varus angulation. No other fractures.  Hip joints normally spaced and aligned. IMPRESSION: 1. Mildly displaced and varus angulated intertrochanteric fracture of the proximal left femur. No dislocation.  Electronically Signed   By: Amie Portland M.D.   On: 01/26/2019 10:13   DG FEMUR PORT MIN 2 VIEWS LEFT  Result Date: 01/27/2019 CLINICAL DATA:  Status post left intramedullary rod femur fixation. EXAM: LEFT FEMUR PORTABLE 2 VIEWS COMPARISON:  January 26, 2019. FINDINGS: Status post surgical internal fixation and intramedullary rod fixation of left femur for treatment of intertrochanteric fracture of proximal left femur. Improved alignment of fracture components is noted. Expected postoperative changes are seen in the surrounding soft tissues. IMPRESSION: Status post surgical internal fixation of intertrochanteric fracture of proximal left femur. Improved alignment of fracture components is noted. Expected postoperative changes are noted. Electronically Signed   By: Lupita Raider M.D.   On: 01/27/2019 15:34    Assessment/Plan  1. Closed fracture of left hip, sequela S/P ORIF of left femur on 01/27/19, continue Lovenox for DVT prophylaxis (stop date 02/27/18), PRN Norco for pain -For home health PT and OT, for therapeutic strengthening exercises, fall precautions -Follow-up with orthopedics - enoxaparin (LOVENOX) 40 MG/0.4ML injection; Inject 0.4 mLs (40 mg total) into the skin daily.  Dispense: 12 mL; Refill: 0 - HYDROcodone-acetaminophen (NORCO/VICODIN) 5-325 MG tablet; Take 1 tablet by mouth every 8 (eight) hours as needed for moderate pain.  Dispense: 21 tablet; Refill: 0  2. Gastroesophageal reflux disease without esophagitis - dexlansoprazole (DEXILANT) 60 MG capsule; Take 1 capsule (60 mg total) by mouth daily.  Dispense: 30 capsule; Refill: 0  3. OAB (overactive bladder) - MYRBETRIQ 50 MG TB24 tablet; Take 1 tablet (50 mg total) by mouth at bedtime.  Dispense: 30 tablet; Refill: 0  4. Left endophthalmia - prednisoLONE acetate (PRED FORTE) 1 % ophthalmic suspension; Place 1 drop into the left eye daily.  Dispense: 5 mL; Refill: 0 - POLYTRIM ophthalmic solution; Place 1 drop into the  right eye 4 (four) times daily.  Dispense: 10 mL; Refill: 0  5. Recurrent major depressive disorder, remission status unspecified (HCC) - PARoxetine (PAXIL) 20 MG tablet; Take 1 tablet (20 mg  total) by mouth every morning.  Dispense: 30 tablet; Refill: 0  6. Acute blood loss anemia Lab Results  Component Value Date   HGB 8.5 (L) 02/01/2019    7. Tremors of nervous system - propranolol ER (INDERAL LA) 60 MG 24 hr capsule; Take 1 capsule (60 mg total) by mouth daily.  Dispense: 30 capsule; Refill: 0  8. Vitamin D deficiency - Vitamin D, Ergocalciferol, (DRISDOL) 1.25 MG (50000 UNIT) CAPS capsule; Take 1 capsule (50,000 Units total) by mouth every 7 (seven) days.  Dispense: 5 capsule; Refill: 0  9. Mixed hyperlipidemia - rosuvastatin (CRESTOR) 5 MG tablet; Take 1 tablet (5 mg total) by mouth at bedtime.  Dispense: 30 tablet; Refill: 0      I have filled out patient's discharge paperwork and written prescriptions.  Patient will receive home health PT and OT.  DME provided: Ramp, wheelchair, folding walker and shower transfer bench  Wheelchair - due to left hip fracture S/P ORIF she has a mobility limitation that prevents  her from completing ADLs. She is unable to safely ambulate with an assistive device such as a cane or walker. She can safely propel a wheelchair and has a caregiver available at all times, able and willing to assist in propelling wheelchair.   Total discharge time: Greater than 30 minutes Greater than 50% was spent in counseling and coordination of care.    Discharge time involved coordination of the discharge process with social worker, nursing staff and therapy department. Medical justification for home health services/DME verified.   Kenard Gower, DNP, FNP-BC [facility] [phone] (Monday-Friday 8:00 a.m. - 5:00 p.m.) [phone] (after hours)

## 2019-02-28 NOTE — Telephone Encounter (Signed)
Called provider and told her to what [facility] said.  She stopped the medications.  Received a fax from [facility] with the instructions - faxed them to the provider at the facility.

## 2019-03-09 DIAGNOSIS — S72142A Displaced intertrochanteric fracture of left femur, initial encounter for closed fracture: Secondary | ICD-10-CM | POA: Insufficient documentation

## 2019-04-11 ENCOUNTER — Other Ambulatory Visit: Payer: Self-pay | Admitting: Adult Health

## 2019-04-11 DIAGNOSIS — E782 Mixed hyperlipidemia: Secondary | ICD-10-CM

## 2019-04-14 ENCOUNTER — Other Ambulatory Visit: Payer: Self-pay | Admitting: Adult Health

## 2019-04-14 DIAGNOSIS — R251 Tremor, unspecified: Secondary | ICD-10-CM

## 2019-05-13 NOTE — Progress Notes (Addendum)
.  [facility] Note  05/17/2019     CHIEF COMPLAINT Patient presents for Retina Follow Up s/p phaco/PCIOL OS, 1.7.19, rg S/p PPV/AC washout/intravitreal vanc, ceftaz, cefepime OS, 1.11.19, bz  HISTORY OF PRESENT ILLNESS: Jennifer Estrada is a 74 y.o. female who presents to the clinic today for:   HPI    Retina Follow Up    Patient presents with  Other (endophthalmitis).  In left eye.  Severity is moderate.  Duration of 9 months.  Since onset it is stable.  I, the attending physician,  performed the HPI with the patient and updated documentation appropriately.          Comments    Patient lost to f/u for about 3 months, due to fall and hip surgery in December 2020.       Last edited by [name], MD on 05/17/2019  3:00 PM. (History)    pt states back in December (12.15.20) she went to the surgery center for cataract sx with [name] and she fell while in the bathroom, she states she hit the trash can and the sink with her head on the left side, pt states she broke/crushed her hip and now has a rod in her hip, pt states she was originally scheduled with [name] for sx, but did not feel comfortable with him so she switched to [name], pt states she spent Christmas and New Years in the hospital, she states she was in rehab until the end of January, she states she just started walking again (pt has a walker), pt has no follow up appt with [name], pt is currently only using PF Qdaily  Referring physician: [name], MD [place],  [place] [address]  HISTORICAL INFORMATION:   Selected notes from the MEDICAL RECORD NUMBER Referred by Dr. [name] for concern of endophthalmitis OS  LEE- 01.11.19  Ocular Hx- CE/toric IOL OS (01.07.19 - R. Groat) - VA 1 day PO OS 20/30, VA OS (01.11.19) HM PMH-     CURRENT MEDICATIONS: Current Outpatient Medications (Ophthalmic Drugs)  Medication Sig  . prednisoLONE acetate (PRED FORTE)  1 % ophthalmic suspension Place 1 drop into the left eye daily.  Marland Kitchen POLYTRIM ophthalmic solution Place 1 drop into the right eye 4 (four) times daily. (Patient not taking: Reported on 05/17/2019)   No current facility-administered medications for this visit. (Ophthalmic Drugs)   Current Outpatient Medications (Other)  Medication Sig  . dexlansoprazole (DEXILANT) 60 MG capsule Take 1 capsule (60 mg total) by mouth daily.  Marland Kitchen docusate sodium 100 MG CAPS Take 100 mg by mouth 2 (two) times daily.  . fexofenadine (ALLEGRA) 30 MG tablet Take 30 mg by mouth as needed (allergies).   Marland Kitchen HYDROcodone-acetaminophen (NORCO/VICODIN) 5-325 MG tablet Take 1 tablet by mouth every 8 (eight) hours as needed for moderate pain.  Marland Kitchen MYRBETRIQ 50 MG TB24 tablet Take 1 tablet (50 mg total) by mouth at bedtime.  . Nutritional Supplements (ENSURE CLEAR PO) Take by mouth. Ensure Clear daily  . PARoxetine (PAXIL) 20 MG tablet Take 1 tablet (20 mg total) by mouth every morning.  . polyethylene glycol (MIRALAX / GLYCOLAX) 17 g packet Take 17 g by mouth daily as needed.  . propranolol ER (INDERAL LA) 60 MG 24 hr capsule Take 1 capsule (60 mg total) by mouth daily.  . rosuvastatin (CRESTOR) 5 MG tablet Take 1 tablet (5 mg total) by mouth at bedtime.  Marland Kitchen  Vitamin D, Ergocalciferol, (DRISDOL) 1.25 MG (50000 UNIT) CAPS capsule Take 1 capsule (50,000 Units total) by mouth every 7 (seven) days.  Marland Kitchen enoxaparin (LOVENOX) 40 MG/0.4ML injection Inject 0.4 mLs (40 mg total) into the skin daily.   No current facility-administered medications for this visit. (Other)      REVIEW OF SYSTEMS: ROS    Positive for: Eyes   Negative for: Constitutional, Gastrointestinal, Neurological, Skin, Genitourinary, Musculoskeletal, HENT, Endocrine, Cardiovascular, Respiratory, Psychiatric, Allergic/Imm, Heme/Lymph   Last edited by Annalee Genta D, COT on 05/17/2019  2:04 PM. (History)       ALLERGIES Allergies  Allergen Reactions  . Atorvastatin  Other (See Comments)    Feels anxous  . Cortisone Other (See Comments)    headache  . Oxycodone Other (See Comments)    confusion  . Paroxetine Other (See Comments)    anxiety  . Penicillins Other (See Comments)    Unknown per pt  . Pravastatin Other (See Comments)    muscle pain   . Singulair [Montelukast] Other (See Comments)    Chest pain  . Sulfa Antibiotics Nausea Only  . Aleve [Naproxen Sodium] Rash  . Augmentin [Amoxicillin-Pot Clavulanate] Rash  . Prednisone Other (See Comments)    headaches    PAST MEDICAL HISTORY Past Medical History:  Diagnosis Date  . Allergic rhinitis   . Chronic kidney disease   . Depression   . DJD (degenerative joint disease) of knee   . GERD (gastroesophageal reflux disease)   . Hyperlipidemia   . Migraine    Takes Propranolol  . Neuromuscular disorder (HCC)    Body tremors at times,h/o falling cast right arm  . Osteoporosis   . Tremors of nervous system   . Vitamin D deficiency    Past Surgical History:  Procedure Laterality Date  . cataract surgery Left   . INTRAMEDULLARY (IM) NAIL INTERTROCHANTERIC Left 01/27/2019   Procedure: INTRAMEDULLARY (IM) NAIL INTERTROCHANTRIC;  Surgeon: Samson Frederic, MD;  Location: [facility];  Service: Orthopedics;  Laterality: Left;  . KNEE SURGERY    . Left Foot Surgery  20 years ago   20 years ago  . ORIF PATELLA Left 02/12/2014   Procedure: OPEN REDUCTION INTERNAL (ORIF) FIXATION LEFT PATELLA;  Surgeon: [name], MD;  Location: [facility];  Service: Orthopedics;  Laterality: Left;  . PARS PLANA VITRECTOMY Left 02/21/2017   Procedure: PARS PLANA VITRECTOMY 25 GAUGE FOR ENDOPHTHALMITIS;  Surgeon: [name], MD;  Location: [facility] OR;  Service: Ophthalmology;  Laterality: Left;  . WRIST FRACTURE SURGERY      FAMILY HISTORY Family History  Problem Relation Age of Onset  . Alzheimer's disease Mother   . Osteoporosis Father   . GER disease Father   . CVA Father   . Breast cancer Neg Hx     SOCIAL  HISTORY Social History   Tobacco Use  . Smoking status: Never Smoker  . Smokeless tobacco: Never Used  Substance Use Topics  . Alcohol use: No  . Drug use: No         OPHTHALMIC EXAM:  Base Eye Exam    Visual Acuity (Snellen - Linear)      Right Left   Dist cc 20/50 +2 20/30 -1   Dist ph cc 20/40 +2 20/30 +2   Correction: Glasses       Tonometry (Tonopen, 2:14 PM)      Right Left   Pressure 14 16       Pupils  Dark Light Shape React APD   Right 3 2 Round Brisk None   Left 3 2 Round Brisk None       Visual Fields (Counting fingers)      Left Right    Full Full       Extraocular Movement      Right Left    Full, Ortho Full, Ortho       Neuro/Psych    Oriented x3: Yes   Mood/Affect: Normal       Dilation    Both eyes: 1.0% Mydriacyl, 2.5% Phenylephrine @ 2:14 PM        Slit Lamp and Fundus Exam    Slit Lamp Exam      Right Left   Lids/Lashes Normal Normal   Conjunctiva/Sclera White and quiet White and quiet   Cornea Arcus, 1+ inferior Punctate epithelial erosions Arcus, trace Edema, trace descemet folds; nylon suture temporally; wounds seidel negative; 2+ PEE  - greatest inferiorly, dry tear film, irregular surface inferiorly   Anterior Chamber Deep and quiet Deep and quiet   Iris Round and dilated Irregular pupil with temporal iridectomy/iridodialysis   Lens 3+ Nuclear sclerosis with brunescence, 3+ Cortical cataract, trace Posterior subcapsular cataract PCIOL displaced superiorly, trace pigment on IOL   Vitreous Vitreous syneresis, Posterior vitreous detachment Post vitrectomy, vitreous condensations and pigment settled inferiorly       Fundus Exam      Right Left   Disc Tilted disc, mild temporal PPA tilted disc, inferior PPA, mild Pallor   C/D Ratio 0.5 0.5   Macula Flat, blunted foveal reflex, Retinal pigment epithelial mottling, No heme or edema flat; blunted foveal reflex, ERM with mild striae superiorly, Retinal pigment epithelial  mottling, No heme    Vessels Vascular attenuation Vascular attenuation   Periphery Attached attached        Refraction    Wearing Rx      Sphere Cylinder Axis   Right +2.50 +0.50 153   Left +1.75 +1.50 180          IMAGING AND PROCEDURES  OCT, Retina - OU - Both Eyes       Right Eye Quality was good. Central Foveal Thickness: 275. Progression has been stable. Findings include normal foveal contour, no IRF, no SRF.   Left Eye Quality was good. Central Foveal Thickness: 303. Progression has been stable. Findings include no IRF, no SRF, abnormal foveal contour, epiretinal membrane, outer retinal atrophy, macular pucker (Blunted foveal contour, patchy ellipsoid atrophy, attached, nasal ERM with mild macular pucker).   Notes *Images captured and stored on drive  Diagnosis / Impression:  OD: NFP, No IRF/SRF  OS: Blunted foveal contour, patchy ellipsoid atrophy, nasal ERM with mild macular pucker, attached, no IRF/SRF  Clinical management:  See below  Abbreviations: NFP - Normal foveal profile. CME - cystoid macular edema. PED - pigment epithelial detachment. IRF - intraretinal fluid. SRF - subretinal fluid. EZ - ellipsoid zone. ERM - epiretinal membrane. ORA - outer retinal atrophy. ORT - outer retinal tubulation. SRHM - subretinal hyper-reflective material                 ASSESSMENT/PLAN:    ICD-10-CM   1. 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 Iridodialysis of left eye  H21.532   3. Pseudophakia, left eye  Z96.1   4. Epiretinal membrane (ERM) of left eye  H35.372   5. Retinal edema  H35.81 OCT, Retina - OU - Both Eyes  6. Combined forms  of age-related cataract of right eye  H25.811     1. Endophthalmitis OS- resolved  - presented on POD4 s/p phaco/PCIOL on Monday, 01.7.19  - VA HM OS w/ 0.688mm hypopyon, fibrin membrane and irregular pupil, and dense vitreous opacities on b-scan ultrasound  - s/p PPV + AC washout + intravitreal vanc, ceftaz, and cefepime, OS,  on 01.11.19  - extensive AC and posterior debris / purulent material noted intra op  - gram stain and culture positive for coag negative staph  - today, doing well with stable VA with just mild residual vit debris inferiorly  - BCVA 20/30 today             - IOP 16 OS today  - inferior nylon suture removed at slit lamp with betadine and polymixin drops post removal -- no complications (03.05.19)             - cont PF Qdaily OS  - cont AT OU QID  - f/u 6-9 months  2. Iridodialysis OS  - sequelae of endophthalmitis and surgery  - pt with significant glare symptoms, but tolerating for now  - monitor  - if symptoms worsen or fail to improve, consider referral to anterior segment surgeon for evaluation  - discussed possibility of corneal tattoo to improve glare symptoms  - pt wishes to think about it further -- will call back if decides to move forward with referral   3. Pseudophakia OS  - s/p phaco/PCIOL OS on Monday, 11.7.19  - toric IOL displaced superiorly, but stable  - do not recommend surgical intervention at this time  4,5 ERM OS  - mild nasal ERM OS with some pucker  - stable from prior  - do not recommend surgical intervention at this time  - monitor  6. Mixed cataract OD  - The symptoms of cataract, surgical options, and treatments and risks were discussed with patient.  - discussed diagnosis and progression  - approaching visual significance, but pt understandably anxious about undergoing cataract surgery  - was at the surgical center for surgery w/ Dr. Elmer PickerHecker in Dec 2020 when she fell and broke her hip pre op -- underwent emergent surgery and a stint in rehab  - clear from a retina standpoint to proceed with cataract surgery when pt and surgeon are ready   Ophthalmic Meds Ordered this visit:  No orders of the defined types were placed in this encounter.     Return for f/u 6-9 months endophthalmitis OS, DFE, OCT.  There are no Patient Instructions on file for this  visit.   Explained the diagnoses, plan, and follow up with the patient and they expressed understanding.  Patient expressed understanding of the importance of proper follow up care.   This document serves as a record of services personally performed by Karie ChimeraBrian G. Stasha Naraine, MD, PhD. It was created on their behalf by Herby AbrahamAshley English, COA, a certified ophthalmic assistant. The creation of this record is the provider's dictation and/or activities during the visit.    Electronically signed by: Herby AbrahamAshley English, COA @TODAY @ 4:28 PM   This document serves as a record of services personally performed by Karie ChimeraBrian G. Akela Pocius, MD, PhD. It was created on their behalf by Laurian BrimAmanda Brown, OA, an ophthalmic assistant. The creation of this record is the provider's dictation and/or activities during the visit.    Electronically signed by: Laurian BrimAmanda Brown, OA 04.05.2021 4:28 PM   Karie ChimeraBrian G. Sayda Grable, M.D., Ph.D. Diseases & Surgery of the Retina and Vitreous  [facility]  I have reviewed the above documentation for accuracy and completeness, and I agree with the above. Karie Chimera, M.D., Ph.D. 05/17/19 4:28 PM   Abbreviations: M myopia (nearsighted); A astigmatism; H hyperopia (farsighted); P presbyopia; Mrx spectacle prescription;  CTL contact lenses; OD right eye; OS left eye; OU both eyes  XT exotropia; ET esotropia; PEK punctate epithelial keratitis; PEE punctate epithelial erosions; DES dry eye syndrome; MGD meibomian gland dysfunction; ATs artificial tears; PFAT's preservative free artificial tears; NSC nuclear sclerotic cataract; PSC posterior subcapsular cataract; ERM epi-retinal membrane; PVD posterior vitreous detachment; RD retinal detachment; DM diabetes mellitus; DR diabetic retinopathy; NPDR non-proliferative diabetic retinopathy; PDR proliferative diabetic retinopathy; CSME clinically significant macular edema; DME diabetic macular edema; dbh dot blot hemorrhages; CWS cotton wool spot; POAG  primary open angle glaucoma; C/D cup-to-disc ratio; HVF humphrey visual field; GVF goldmann visual field; OCT optical coherence tomography; IOP intraocular pressure; BRVO Branch retinal vein occlusion; CRVO central retinal vein occlusion; CRAO central retinal artery occlusion; BRAO branch retinal artery occlusion; RT retinal tear; SB scleral buckle; PPV pars plana vitrectomy; VH Vitreous hemorrhage; PRP panretinal laser photocoagulation; IVK intravitreal kenalog; VMT vitreomacular traction; MH Macular hole;  NVD neovascularization of the disc; NVE neovascularization elsewhere; AREDS age related eye disease study; ARMD age related macular degeneration; POAG primary open angle glaucoma; EBMD epithelial/anterior basement membrane dystrophy; ACIOL anterior chamber intraocular lens; IOL intraocular lens; PCIOL posterior chamber intraocular lens; Phaco/IOL phacoemulsification with intraocular lens placement; PRK photorefractive keratectomy; LASIK laser assisted in situ keratomileusis; HTN hypertension; DM diabetes mellitus; COPD chronic obstructive pulmonary disease

## 2019-05-17 ENCOUNTER — Encounter (INDEPENDENT_AMBULATORY_CARE_PROVIDER_SITE_OTHER): Payer: Self-pay | Admitting: Ophthalmology

## 2019-05-17 ENCOUNTER — Ambulatory Visit (INDEPENDENT_AMBULATORY_CARE_PROVIDER_SITE_OTHER): Payer: Medicare Other | Admitting: Ophthalmology

## 2019-05-17 DIAGNOSIS — Z961 Presence of intraocular lens: Secondary | ICD-10-CM

## 2019-05-17 DIAGNOSIS — H25811 Combined forms of age-related cataract, right eye: Secondary | ICD-10-CM

## 2019-05-17 DIAGNOSIS — H44002 Unspecified purulent endophthalmitis, left eye: Secondary | ICD-10-CM | POA: Diagnosis not present

## 2019-05-17 DIAGNOSIS — H21532 Iridodialysis, left eye: Secondary | ICD-10-CM

## 2019-05-17 DIAGNOSIS — H3581 Retinal edema: Secondary | ICD-10-CM | POA: Diagnosis not present

## 2019-05-17 DIAGNOSIS — H35372 Puckering of macula, left eye: Secondary | ICD-10-CM

## 2019-07-19 ENCOUNTER — Other Ambulatory Visit: Payer: Self-pay

## 2019-07-19 ENCOUNTER — Encounter (HOSPITAL_COMMUNITY): Payer: Self-pay | Admitting: Emergency Medicine

## 2019-07-19 ENCOUNTER — Emergency Department (HOSPITAL_COMMUNITY): Payer: Medicare Other

## 2019-07-19 ENCOUNTER — Emergency Department (HOSPITAL_COMMUNITY)
Admission: EM | Admit: 2019-07-19 | Discharge: 2019-07-19 | Disposition: A | Discharge status: Home / Self Care | Payer: Medicare Other | Attending: Emergency Medicine | Admitting: Emergency Medicine

## 2019-07-19 DIAGNOSIS — Z79899 Other long term (current) drug therapy: Secondary | ICD-10-CM | POA: Insufficient documentation

## 2019-07-19 DIAGNOSIS — K279 Peptic ulcer, site unspecified, unspecified as acute or chronic, without hemorrhage or perforation: Secondary | ICD-10-CM | POA: Diagnosis not present

## 2019-07-19 DIAGNOSIS — R1013 Epigastric pain: Secondary | ICD-10-CM | POA: Diagnosis present

## 2019-07-19 LAB — CBC
HCT: 41.5 % (ref 36.0–46.0)
Hemoglobin: 13.5 g/dL (ref 12.0–15.0)
MCH: 31.2 pg (ref 26.0–34.0)
MCHC: 32.5 g/dL (ref 30.0–36.0)
MCV: 95.8 fL (ref 80.0–100.0)
Platelets: 241 10*3/uL (ref 150–400)
RBC: 4.33 MIL/uL (ref 3.87–5.11)
RDW: 14.1 % (ref 11.5–15.5)
WBC: 6.4 10*3/uL (ref 4.0–10.5)
nRBC: 0 % (ref 0.0–0.2)

## 2019-07-19 LAB — COMPREHENSIVE METABOLIC PANEL
ALT: 22 U/L (ref 0–44)
AST: 29 U/L (ref 15–41)
Albumin: 3.8 g/dL (ref 3.5–5.0)
Alkaline Phosphatase: 102 U/L (ref 38–126)
Anion gap: 11 (ref 5–15)
BUN: 14 mg/dL (ref 8–23)
CO2: 22 mmol/L (ref 22–32)
Calcium: 9.8 mg/dL (ref 8.9–10.3)
Chloride: 106 mmol/L (ref 98–111)
Creatinine, Ser: 1.02 mg/dL — ABNORMAL HIGH (ref 0.44–1.00)
GFR calc Af Amer: >60 mL/min (ref >60)
GFR calc non Af Amer: 54 mL/min — ABNORMAL LOW (ref >60)
Glucose, Bld: 107 mg/dL — ABNORMAL HIGH (ref 70–99)
Potassium: 3.7 mmol/L (ref 3.5–5.1)
Sodium: 139 mmol/L (ref 135–145)
Total Bilirubin: 1.2 mg/dL (ref 0.3–1.2)
Total Protein: 6.7 g/dL (ref 6.5–8.1)

## 2019-07-19 LAB — LIPASE, BLOOD: Lipase: 31 U/L (ref 11–51)

## 2019-07-19 MED ORDER — SODIUM CHLORIDE 0.9% FLUSH: 3.0000 mL | Freq: Once | INTRAVENOUS | Status: DC

## 2019-07-19 MED ORDER — SUCRALFATE 1 G PO TABS
1.0000 g | ORAL_TABLET | Freq: Once | ORAL | Status: AC
  Administered 2019-07-19: 1 g via ORAL
  Filled 2019-07-19: qty 1

## 2019-07-19 MED ORDER — ALUM & MAG HYDROXIDE-SIMETH 200-200-20 MG/5ML PO SUSP
30.0000 mL | Freq: Once | ORAL | Status: AC
  Administered 2019-07-19: 30 mL via ORAL
  Filled 2019-07-19: qty 30

## 2019-07-19 MED ORDER — SUCRALFATE 1 G PO TABS: 1.0000 g | ORAL_TABLET | Freq: Three times a day (TID) | ORAL | 0 refills | Status: DC

## 2019-07-19 MED ORDER — FAMOTIDINE 20 MG PO TABS
20.0000 mg | ORAL_TABLET | Freq: Once | ORAL | Status: AC
  Administered 2019-07-19: 20 mg via ORAL
  Filled 2019-07-19: qty 1

## 2019-07-19 NOTE — ED Notes (Signed)
States she is feeling much better 

## 2019-07-19 NOTE — ED Provider Notes (Signed)
[facility] EMERGENCY DEPARTMENT Provider Note   CSN: [id number] Arrival date & time: 07/19/19  0744     History Chief Complaint  Patient presents with  . Abdominal Pain    Jennifer Estrada is a 74 y.o. female.  HPI     74 y/o with hx of PUD comes in with cc of epigastric pain. Pain is 9/10 and started overnight. Pain is epigastric and radiating to the back. She had singular episode of emesis, non bloody. BM have been normal. Pt has no chest pain. She reports that the symptoms are similar to her reflux pain, but it has never been this intense.  Past Medical History:  Diagnosis Date  . Allergic rhinitis   . Chronic kidney disease   . Depression   . DJD (degenerative joint disease) of knee   . GERD (gastroesophageal reflux disease)   . Hyperlipidemia   . Migraine    Takes Propranolol  . Neuromuscular disorder (HCC)    Body tremors at times,h/o falling cast right arm  . Osteoporosis   . Tremors of nervous system   . Vitamin D deficiency     Patient Active Problem List   Diagnosis Date Noted  . OAB (overactive bladder) 02/24/2019  . Vitamin D deficiency   . Allergic rhinitis   . 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 Pseudophakia 02/03/2019  . Closed left hip fracture (HCC) 01/26/2019  . Tremors of nervous system   . GERD (gastroesophageal reflux disease)   . Hyperlipidemia   . Depression   . Acute blood loss anemia   . Syncope   . Pain in both knees 05/20/2017  . Patella fracture 02/12/2014  . Migraine syndrome 05/02/2012  . Anxiety attack 05/02/2012  . TIA (transient ischemic attack) 05/02/2012    Past Surgical History:  Procedure Laterality Date  . cataract surgery Left   . INTRAMEDULLARY (IM) NAIL INTERTROCHANTERIC Left 01/27/2019   Procedure: INTRAMEDULLARY (IM) NAIL INTERTROCHANTRIC;  Surgeon: Samson Frederic, MD;  Location: [facility];  Service: Orthopedics;  Laterality: Left;  . KNEE SURGERY    . Left Foot Surgery  20 years ago   20 years  ago  . ORIF PATELLA Left 02/12/2014   Procedure: OPEN REDUCTION INTERNAL (ORIF) FIXATION LEFT PATELLA;  Surgeon: [name], MD;  Location: [facility];  Service: Orthopedics;  Laterality: Left;  . PARS PLANA VITRECTOMY Left 02/21/2017   Procedure: PARS PLANA VITRECTOMY 25 GAUGE FOR ENDOPHTHALMITIS;  Surgeon: [name], MD;  Location: [facility];  Service: Ophthalmology;  Laterality: Left;  . WRIST FRACTURE SURGERY       OB History   No obstetric history on file.     Family History  Problem Relation Age of Onset  . Alzheimer's disease Mother   . Osteoporosis Father   . GER disease Father   . CVA Father   . Breast cancer Neg Hx     Social History   Tobacco Use  . Smoking status: Never Smoker  . Smokeless tobacco: Never Used  Substance Use Topics  . Alcohol use: No  . Drug use: No    Home Medications Prior to Admission medications   Medication Sig Start Date End Date Taking? Authorizing Provider  acetaminophen (TYLENOL) 500 MG tablet Take 500 mg by mouth every 6 (six) hours as needed for mild pain or headache.   Yes [provider]  dexlansoprazole (DEXILANT) 60 MG capsule Take 1 capsule (60 mg total) by mouth daily. 02/25/19  Yes Medina-Vargas, Margit Banda, NP  docusate sodium 100 MG CAPS Take 100 mg by mouth 2 (two) times daily. 02/14/14  Yes Babish, Molli Hazard, PA-C  fexofenadine (ALLEGRA) 30 MG tablet Take 30 mg by mouth as needed (allergies).    Yes [provider]  MYRBETRIQ 50 MG TB24 tablet Take 1 tablet (50 mg total) by mouth at bedtime. 02/25/19  Yes Medina-Vargas, Monina C, NP  PARoxetine (PAXIL) 20 MG tablet Take 1 tablet (20 mg total) by mouth every morning. 02/25/19  Yes Medina-Vargas, Monina C, NP  prednisoLONE acetate (PRED FORTE) 1 % ophthalmic suspension Place 1 drop into the left eye daily. 02/25/19  Yes Medina-Vargas, Monina C, NP  propranolol ER (INDERAL LA) 60 MG 24 hr capsule Take 1 capsule (60 mg total) by mouth daily. 02/25/19  Yes Medina-Vargas,  Monina C, NP  rosuvastatin (CRESTOR) 5 MG tablet Take 1 tablet (5 mg total) by mouth at bedtime. Patient taking differently: Take 5 mg by mouth 3 (three) times a week. Mon, Wed, Fri 02/25/19  Yes Medina-Vargas, Monina C, NP  Vitamin D, Ergocalciferol, (DRISDOL) 1.25 MG (50000 UNIT) CAPS capsule Take 1 capsule (50,000 Units total) by mouth every 7 (seven) days. Patient taking differently: Take 50,000 Units by mouth every 14 (fourteen) days.  02/25/19  Yes Medina-Vargas, Monina C, NP  enoxaparin (LOVENOX) 40 MG/0.4ML injection Inject 0.4 mLs (40 mg total) into the skin daily. 02/25/19 03/27/19  Medina-Vargas, Monina C, NP  HYDROcodone-acetaminophen (NORCO/VICODIN) 5-325 MG tablet Take 1 tablet by mouth every 8 (eight) hours as needed for moderate pain. Patient not taking: Reported on 07/19/2019 02/25/19   Medina-Vargas, Monina C, NP  polyethylene glycol (MIRALAX / GLYCOLAX) 17 g packet Take 17 g by mouth daily as needed. Patient not taking: Reported on 07/19/2019 02/02/19   Lurene Shadow, MD  POLYTRIM ophthalmic solution Place 1 drop into the right eye 4 (four) times daily. Patient not taking: Reported on 05/17/2019 02/25/19   Medina-Vargas, Monina C, NP  sucralfate (CARAFATE) 1 g tablet Take 1 tablet (1 g total) by mouth 4 (four) times daily -  with meals and at bedtime. 07/19/19   Derwood Kaplan, MD    Allergies    Atorvastatin, Cortisone, Oxycodone, Paroxetine, Penicillins, Pravastatin, Singulair [montelukast], Sulfa antibiotics, Aleve [naproxen sodium], Augmentin [amoxicillin-pot clavulanate], and Prednisone  Review of Systems   Review of Systems  Constitutional: Positive for activity change.  Respiratory: Negative for shortness of breath.   Cardiovascular: Negative for chest pain.  Gastrointestinal: Positive for abdominal pain. Negative for constipation, diarrhea, nausea and vomiting.  Genitourinary: Negative for dysuria.  All other systems reviewed and are negative.   Physical Exam Updated Vital  Signs BP 125/71   Pulse 68   Temp 97.8 F (36.6 C) (Oral)   Resp (!) 22   Ht 5\' 6"  (1.676 m)   Wt 75.8 kg   SpO2 90%   BMI 26.95 kg/m   Physical Exam Vitals and nursing note reviewed.  Constitutional:      Appearance: She is well-developed.  HENT:     Head: Normocephalic and atraumatic.  Eyes:     Pupils: Pupils are equal, round, and reactive to light.  Cardiovascular:     Rate and Rhythm: Normal rate and regular rhythm.     Heart sounds: Normal heart sounds. No murmur.  Pulmonary:     Effort: Pulmonary effort is normal. No respiratory distress.  Abdominal:     General: There is no distension.     Palpations: Abdomen is soft.     Tenderness:  There is abdominal tenderness in the epigastric area. There is no guarding or rebound.  Musculoskeletal:     Cervical back: Neck supple.  Skin:    General: Skin is warm and dry.  Neurological:     Mental Status: She is alert and oriented to person, place, and time.     ED Results / Procedures / Treatments   Labs (all labs ordered are listed, but only abnormal results are displayed) Labs Reviewed  COMPREHENSIVE METABOLIC PANEL - Abnormal; Notable for the following components:      Result Value   Glucose, Bld 107 (*)    Creatinine, Ser 1.02 (*)    GFR calc non Af Amer 54 (*)    All other components within normal limits  LIPASE, BLOOD  CBC  URINALYSIS, ROUTINE W REFLEX MICROSCOPIC    EKG EKG Interpretation  Date/Time:  Monday July 19 2019 07:41:34 EDT Ventricular Rate:  66 PR Interval:  158 QRS Duration: 80 QT Interval:  386 QTC Calculation: 404 R Axis:   88 Text Interpretation: Normal sinus rhythm Nonspecific ST abnormality Abnormal ECG No acute changes Confirmed by Derwood Kaplan [phone]) on 07/19/2019 9:35:56 AM   Radiology DG Chest Port 1 View  Result Date: 07/19/2019 CLINICAL DATA:  Epigastric abdominal pain since this morning, 8 greasy food yesterday, history GERD EXAM: PORTABLE CHEST 1 VIEW COMPARISON:   Portable exam 1036 hours compared to 01/26/2019 FINDINGS: Normal heart size, mediastinal contours, and pulmonary vascularity. Atherosclerotic calcification aorta. Minimal peribronchial thickening and hyperinflation question COPD. No pulmonary infiltrate, pleural effusion or pneumothorax. Bones demineralized. IMPRESSION: Peribronchial thickening and mild hyperinflation which could represent COPD or asthma. No acute infiltrate. Aortic Atherosclerosis (ICD10-I70.0). Electronically Signed   By: Ulyses Southward M.D.   On: 07/19/2019 10:51    Procedures Procedures (including critical care time)  Medications Ordered in ED Medications  sodium chloride flush (NS) 0.9 % injection 3 mL (3 mLs Intravenous Not Given 07/19/19 1010)  alum & mag hydroxide-simeth (MAALOX/MYLANTA) 200-200-20 MG/5ML suspension 30 mL (30 mLs Oral Given 07/19/19 1119)  sucralfate (CARAFATE) tablet 1 g (1 g Oral Given 07/19/19 1118)  famotidine (PEPCID) tablet 20 mg (20 mg Oral Given 07/19/19 1119)    ED Course  I have reviewed the triage vital signs and the nursing notes.  Pertinent labs & imaging results that were available during my care of the patient were reviewed by me and considered in my medical decision making (see chart for details).  Clinical Course as of Jul 18 1309  Mon Jul 19, 2019  1310 Patient reassessed.  Her labs are completely normal.  Hemodynamically she has had vital signs within normal limits for the entire time.  During reassessment she reports that her pain has improved dramatically.  She has no right upper quadrant tenderness and continues to have no peritoneal findings.  I do not think any further imaging is needed.  Results of the ED work-up and the recommendations for close outpatient follow-up and strict ER return precautions have been discussed with her.   [AN]    Clinical Course User Index [AN] Derwood Kaplan, MD   MDM Rules/Calculators/A&P                      DDx includes: Pancreatitis Hepatobiliary  pathology including cholecystitis Gastritis/PUD SBO ACS syndrome Aortic Dissection   74 year old female comes in a chief complaint of abdominal pain. She has known history of peptic ulcer disease.  She reports having epigastric abdominal pain radiating up  her chest starting this morning.  She went to bed feeling okay.  Her symptoms is described as similar to her acid reflux pain.  She has had  pain like this before but not to this extent.  She also reports possible gallbladder disease.  Our exam is overall reassuring.  She has mild epigastric tenderness without any peritoneal findings and no Murphy's or right upper quadrant tenderness.  We performed a vascular exam and she has intact and equal 2+ dorsalis pedis pulse and radial pulse.  No high risk features for ACS or dissection.  EKG is not showing any ischemic changes.  Clinically this does not appear to be a cardiovascular process.  We will focus on symptom control and get a chest x-ray to ensure there is no free air.  No risk factors for pancreatitis, lipase has been ordered as well.  Final Clinical Impression(s) / ED Diagnoses Final diagnoses:  PUD (peptic ulcer disease)    Rx / DC Orders ED Discharge Orders         Ordered    sucralfate (CARAFATE) 1 g tablet  3 times daily with meals & bedtime     07/19/19 1310           Varney Biles, MD 07/19/19 1313

## 2019-07-19 NOTE — Discharge Instructions (Addendum)
Please take medication as prescribed for the next 3 to 5 days.  Thereafter you can take the medicine only if needed.  It is prudent that you follow-up with your GI doctor soon as possible.  Please read the instructions provided on diet change.  Return to the ER if your symptoms get worse or you start having severe vomiting, chest pain, shortness of breath.

## 2019-07-19 NOTE — ED Triage Notes (Signed)
Patient arrives to ED with complaints of epigastric abdominal pain starting this morning. Patient states she ate greasy foods yesterday and has hx of GERD. Patient denies N/V.

## 2019-08-18 ENCOUNTER — Emergency Department (HOSPITAL_COMMUNITY): Payer: Medicare Other

## 2019-08-18 ENCOUNTER — Encounter (HOSPITAL_COMMUNITY): Payer: Self-pay

## 2019-08-18 ENCOUNTER — Inpatient Hospital Stay (HOSPITAL_COMMUNITY)
Admission: EM | Admit: 2019-08-18 | Discharge: 2019-08-20 | DRG: 419 | Disposition: A | Discharge status: Home / Self Care | Payer: Medicare Other | Attending: General Surgery | Admitting: General Surgery

## 2019-08-18 DIAGNOSIS — Z20822 Contact with and (suspected) exposure to covid-19: Secondary | ICD-10-CM | POA: Diagnosis present

## 2019-08-18 DIAGNOSIS — Z79899 Other long term (current) drug therapy: Secondary | ICD-10-CM | POA: Diagnosis not present

## 2019-08-18 DIAGNOSIS — Z885 Allergy status to narcotic agent status: Secondary | ICD-10-CM | POA: Diagnosis not present

## 2019-08-18 DIAGNOSIS — E559 Vitamin D deficiency, unspecified: Secondary | ICD-10-CM | POA: Diagnosis present

## 2019-08-18 DIAGNOSIS — K219 Gastro-esophageal reflux disease without esophagitis: Secondary | ICD-10-CM | POA: Diagnosis present

## 2019-08-18 DIAGNOSIS — Z882 Allergy status to sulfonamides status: Secondary | ICD-10-CM | POA: Diagnosis not present

## 2019-08-18 DIAGNOSIS — F329 Major depressive disorder, single episode, unspecified: Secondary | ICD-10-CM | POA: Diagnosis present

## 2019-08-18 DIAGNOSIS — M810 Age-related osteoporosis without current pathological fracture: Secondary | ICD-10-CM | POA: Diagnosis present

## 2019-08-18 DIAGNOSIS — Z888 Allergy status to other drugs, medicaments and biological substances status: Secondary | ICD-10-CM | POA: Diagnosis not present

## 2019-08-18 DIAGNOSIS — R1011 Right upper quadrant pain: Secondary | ICD-10-CM

## 2019-08-18 DIAGNOSIS — N189 Chronic kidney disease, unspecified: Secondary | ICD-10-CM | POA: Diagnosis present

## 2019-08-18 DIAGNOSIS — K810 Acute cholecystitis: Secondary | ICD-10-CM | POA: Diagnosis present

## 2019-08-18 DIAGNOSIS — Z880 Allergy status to penicillin: Secondary | ICD-10-CM

## 2019-08-18 DIAGNOSIS — K819 Cholecystitis, unspecified: Secondary | ICD-10-CM | POA: Diagnosis present

## 2019-08-18 DIAGNOSIS — Z8262 Family history of osteoporosis: Secondary | ICD-10-CM | POA: Diagnosis not present

## 2019-08-18 DIAGNOSIS — E785 Hyperlipidemia, unspecified: Secondary | ICD-10-CM | POA: Diagnosis present

## 2019-08-18 DIAGNOSIS — G43909 Migraine, unspecified, not intractable, without status migrainosus: Secondary | ICD-10-CM | POA: Diagnosis present

## 2019-08-18 LAB — CBC
HCT: 44.5 % (ref 36.0–46.0)
Hemoglobin: 14.5 g/dL (ref 12.0–15.0)
MCH: 31 pg (ref 26.0–34.0)
MCHC: 32.6 g/dL (ref 30.0–36.0)
MCV: 95.1 fL (ref 80.0–100.0)
Platelets: 247 10*3/uL (ref 150–400)
RBC: 4.68 MIL/uL (ref 3.87–5.11)
RDW: 13.5 % (ref 11.5–15.5)
WBC: 11.7 10*3/uL — ABNORMAL HIGH (ref 4.0–10.5)
nRBC: 0 % (ref 0.0–0.2)

## 2019-08-18 LAB — URINALYSIS, ROUTINE W REFLEX MICROSCOPIC
Bilirubin Urine: NEGATIVE
Glucose, UA: NEGATIVE mg/dL
Ketones, ur: NEGATIVE mg/dL
Leukocytes,Ua: NEGATIVE
Nitrite: NEGATIVE
Protein, ur: NEGATIVE mg/dL
Specific Gravity, Urine: >1.046 — ABNORMAL HIGH (ref 1.005–1.030)
pH: 6 (ref 5.0–8.0)

## 2019-08-18 LAB — COMPREHENSIVE METABOLIC PANEL
ALT: 25 U/L (ref 0–44)
AST: 27 U/L (ref 15–41)
Albumin: 3.8 g/dL (ref 3.5–5.0)
Alkaline Phosphatase: 85 U/L (ref 38–126)
Anion gap: 12 (ref 5–15)
BUN: 10 mg/dL (ref 8–23)
CO2: 22 mmol/L (ref 22–32)
Calcium: 9.7 mg/dL (ref 8.9–10.3)
Chloride: 101 mmol/L (ref 98–111)
Creatinine, Ser: 1.08 mg/dL — ABNORMAL HIGH (ref 0.44–1.00)
GFR calc Af Amer: 59 mL/min — ABNORMAL LOW (ref >60)
GFR calc non Af Amer: 51 mL/min — ABNORMAL LOW (ref >60)
Glucose, Bld: 120 mg/dL — ABNORMAL HIGH (ref 70–99)
Potassium: 3.9 mmol/L (ref 3.5–5.1)
Sodium: 135 mmol/L (ref 135–145)
Total Bilirubin: 0.9 mg/dL (ref 0.3–1.2)
Total Protein: 7.2 g/dL (ref 6.5–8.1)

## 2019-08-18 LAB — SARS CORONAVIRUS 2 BY RT PCR (HOSPITAL ORDER, PERFORMED IN ~~LOC~~ HOSPITAL LAB): SARS Coronavirus 2: NEGATIVE

## 2019-08-18 LAB — LACTIC ACID, PLASMA: Lactic Acid, Venous: 1.8 mmol/L (ref 0.5–1.9)

## 2019-08-18 LAB — LIPASE, BLOOD: Lipase: 24 U/L (ref 11–51)

## 2019-08-18 MED ORDER — SODIUM CHLORIDE 0.9 % IV SOLN
2.0000 g | INTRAVENOUS | Status: DC
  Administered 2019-08-18: 2 g via INTRAVENOUS
  Filled 2019-08-18: qty 20

## 2019-08-18 MED ORDER — ALUM & MAG HYDROXIDE-SIMETH 200-200-20 MG/5ML PO SUSP
30.0000 mL | Freq: Once | ORAL | Status: AC
  Administered 2019-08-18: 30 mL via ORAL
  Filled 2019-08-18: qty 30

## 2019-08-18 MED ORDER — ONDANSETRON HCL 4 MG/2ML IJ SOLN: 4.0000 mg | Freq: Four times a day (QID) | INTRAMUSCULAR | Status: DC | PRN

## 2019-08-18 MED ORDER — PANTOPRAZOLE SODIUM 40 MG PO TBEC: 40.0000 mg | DELAYED_RELEASE_TABLET | Freq: Every day | ORAL | Status: DC

## 2019-08-18 MED ORDER — SIMETHICONE 80 MG PO CHEW: 40.0000 mg | CHEWABLE_TABLET | Freq: Four times a day (QID) | ORAL | Status: DC | PRN

## 2019-08-18 MED ORDER — ACETAMINOPHEN 500 MG PO TABS: 1000.0000 mg | ORAL_TABLET | Freq: Four times a day (QID) | ORAL | Status: DC

## 2019-08-18 MED ORDER — PROPRANOLOL HCL ER 60 MG PO CP24
60.0000 mg | ORAL_CAPSULE | Freq: Every day | ORAL | Status: DC
  Filled 2019-08-18: qty 1

## 2019-08-18 MED ORDER — ONDANSETRON 4 MG PO TBDP: 4.0000 mg | ORAL_TABLET | Freq: Four times a day (QID) | ORAL | Status: DC | PRN

## 2019-08-18 MED ORDER — HEPARIN SODIUM (PORCINE) 5000 UNIT/ML IJ SOLN
5000.0000 [IU] | Freq: Three times a day (TID) | INTRAMUSCULAR | Status: DC
  Administered 2019-08-19 (×2): 5000 [IU] via SUBCUTANEOUS
  Filled 2019-08-18 (×2): qty 1

## 2019-08-18 MED ORDER — DIPHENHYDRAMINE HCL 12.5 MG/5ML PO ELIX: 12.5000 mg | ORAL_SOLUTION | Freq: Four times a day (QID) | ORAL | Status: DC | PRN

## 2019-08-18 MED ORDER — TRAMADOL HCL 50 MG PO TABS: 50.0000 mg | ORAL_TABLET | Freq: Four times a day (QID) | ORAL | Status: DC | PRN

## 2019-08-18 MED ORDER — PREDNISOLONE ACETATE 1 % OP SUSP: 1.0000 [drp] | Freq: Every day | OPHTHALMIC | Status: DC

## 2019-08-18 MED ORDER — SUCRALFATE 1 G PO TABS: 1.0000 g | ORAL_TABLET | Freq: Three times a day (TID) | ORAL | Status: DC

## 2019-08-18 MED ORDER — IOHEXOL 300 MG/ML  SOLN
100.0000 mL | Freq: Once | INTRAMUSCULAR | Status: AC | PRN
  Administered 2019-08-18: 100 mL via INTRAVENOUS

## 2019-08-18 MED ORDER — CIPROFLOXACIN IN D5W 400 MG/200ML IV SOLN
400.0000 mg | Freq: Two times a day (BID) | INTRAVENOUS | Status: DC
  Administered 2019-08-19: 400 mg via INTRAVENOUS
  Filled 2019-08-18: qty 200

## 2019-08-18 MED ORDER — ROSUVASTATIN CALCIUM 5 MG PO TABS: 5.0000 mg | ORAL_TABLET | ORAL | Status: DC

## 2019-08-18 MED ORDER — HYDROMORPHONE HCL 1 MG/ML IJ SOLN: 0.5000 mg | INTRAMUSCULAR | Status: DC | PRN

## 2019-08-18 MED ORDER — FENTANYL CITRATE (PF) 100 MCG/2ML IJ SOLN
50.0000 ug | Freq: Once | INTRAMUSCULAR | Status: AC
  Administered 2019-08-18: 50 ug via INTRAVENOUS
  Filled 2019-08-18: qty 2

## 2019-08-18 MED ORDER — DIPHENHYDRAMINE HCL 50 MG/ML IJ SOLN: 12.5000 mg | Freq: Four times a day (QID) | INTRAMUSCULAR | Status: DC | PRN

## 2019-08-18 MED ORDER — METRONIDAZOLE IN NACL 5-0.79 MG/ML-% IV SOLN
500.0000 mg | Freq: Three times a day (TID) | INTRAVENOUS | Status: DC
  Administered 2019-08-18: 500 mg via INTRAVENOUS
  Filled 2019-08-18: qty 100

## 2019-08-18 MED ORDER — METRONIDAZOLE IN NACL 5-0.79 MG/ML-% IV SOLN
500.0000 mg | Freq: Three times a day (TID) | INTRAVENOUS | Status: DC
  Administered 2019-08-19: 500 mg via INTRAVENOUS
  Filled 2019-08-18: qty 100

## 2019-08-18 MED ORDER — PAROXETINE HCL 20 MG PO TABS
20.0000 mg | ORAL_TABLET | Freq: Every morning | ORAL | Status: DC
  Filled 2019-08-18: qty 1

## 2019-08-18 MED ORDER — LACTATED RINGERS IV SOLN: INTRAVENOUS | Status: DC

## 2019-08-18 MED ORDER — DOCUSATE SODIUM 100 MG PO CAPS: 100.0000 mg | ORAL_CAPSULE | Freq: Two times a day (BID) | ORAL | Status: DC

## 2019-08-18 MED ORDER — ONDANSETRON HCL 4 MG/2ML IJ SOLN
4.0000 mg | Freq: Once | INTRAMUSCULAR | Status: AC
  Administered 2019-08-18: 4 mg via INTRAVENOUS
  Filled 2019-08-18: qty 2

## 2019-08-18 NOTE — H&P (Signed)
CC: RUQ abdominal pain  Requesting provider: Dr. Scharlene Gloss, MD  HPI: Jennifer Estrada is an 74 y.o. female with hx of HLD, DJD, mild CKD, hiatal hernia and reflux whom presents to ED complaining of ~24 hr hx of severe RUQ pain. She has dealt with reflux/GERD and even what she relates as peptic ulcer dz (although no clear EGD on file here showing this) and with this has had mild intermittent MEG discomforts. This is different. This is sharp, severe and nonradiating. Has persisted for ~24 hrs now. Associated fever/chills and nausea/vomiting. Nothing makes it better/worse.  She is here with her husband whom has had a laparoscopic cholecystectomy previously as well  She denies any prior abdominal or pelvic surgical hx  Past Medical History:  Diagnosis Date  . Allergic rhinitis   . Chronic kidney disease   . Depression   . DJD (degenerative joint disease) of knee   . GERD (gastroesophageal reflux disease)   . Hyperlipidemia   . Migraine    Takes Propranolol  . Neuromuscular disorder (HCC)    Body tremors at times,h/o falling cast right arm  . Osteoporosis   . Tremors of nervous system   . Vitamin D deficiency     Past Surgical History:  Procedure Laterality Date  . cataract surgery Left   . INTRAMEDULLARY (IM) NAIL INTERTROCHANTERIC Left 01/27/2019   Procedure: INTRAMEDULLARY (IM) NAIL INTERTROCHANTRIC;  Surgeon: Samson Frederic, MD;  Location: [facility];  Service: Orthopedics;  Laterality: Left;  . KNEE SURGERY    . Left Foot Surgery  20 years ago   20 years ago  . ORIF PATELLA Left 02/12/2014   Procedure: OPEN REDUCTION INTERNAL (ORIF) FIXATION LEFT PATELLA;  Surgeon: [name], MD;  Location: [facility];  Service: Orthopedics;  Laterality: Left;  . PARS PLANA VITRECTOMY Left 02/21/2017   Procedure: PARS PLANA VITRECTOMY 25 GAUGE FOR ENDOPHTHALMITIS;  Surgeon: [name], MD;  Location: [facility] OR;  Service: Ophthalmology;  Laterality: Left;  . WRIST FRACTURE SURGERY      Family  History  Problem Relation Age of Onset  . Alzheimer's disease Mother   . Osteoporosis Father   . GER disease Father   . CVA Father   . Breast cancer Neg Hx     Social:  reports that she has never smoked. She has never used smokeless tobacco. She reports that she does not drink alcohol and does not use drugs.  Allergies:  Allergies  Allergen Reactions  . Atorvastatin Other (See Comments)    Feels anxous  . Cortisone Other (See Comments)    headache  . Oxycodone Other (See Comments)    confusion  . Paroxetine Other (See Comments)    anxiety  . Penicillins Other (See Comments)    Unknown per pt  . Pravastatin Other (See Comments)    muscle pain   . Singulair [Montelukast] Other (See Comments)    Chest pain  . Sulfa Antibiotics Nausea Only  . Aleve [Naproxen Sodium] Rash  . Augmentin [Amoxicillin-Pot Clavulanate] Rash  . Prednisone Other (See Comments)    headaches    Medications: I have reviewed the patient's current medications. Prior to Admission: (Not in a hospital admission)   Results for orders placed or performed during the hospital encounter of 08/18/19 (from the past 48 hour(s))  Lipase, blood     Status: None   Collection Time: 08/18/19  8:57 AM  Result Value Ref Range   Lipase 24 11 - 51 U/L    Comment:  Performed at [facility], [address] [place], [place] [address]  Comprehensive metabolic panel     Status: Abnormal   Collection Time: 08/18/19  8:57 AM  Result Value Ref Range   Sodium 135 135 - 145 mmol/L   Potassium 3.9 3.5 - 5.1 mmol/L   Chloride 101 98 - 111 mmol/L   CO2 22 22 - 32 mmol/L   Glucose, Bld 120 (H) 70 - 99 mg/dL    Comment: Glucose reference range applies only to samples taken after fasting for at least 8 hours.   BUN 10 8 - 23 mg/dL   Creatinine, Ser 6.041.08 (H) 0.44 - 1.00 mg/dL   Calcium 9.7 8.9 - 54.010.3 mg/dL   Total Protein 7.2 6.5 - 8.1 g/dL   Albumin 3.8 3.5 - 5.0 g/dL   AST 27 15 - 41 U/L   ALT 25 0 - 44 U/L    Alkaline Phosphatase 85 38 - 126 U/L   Total Bilirubin 0.9 0.3 - 1.2 mg/dL   GFR calc non Af Amer 51 (L) >60 mL/min   GFR calc Af Amer 59 (L) >60 mL/min   Anion gap 12 5 - 15    Comment: Performed at [facility], [address] [place], [place] [address]  CBC     Status: Abnormal   Collection Time: 08/18/19  8:57 AM  Result Value Ref Range   WBC 11.7 (H) 4.0 - 10.5 K/uL   RBC 4.68 3.87 - 5.11 MIL/uL   Hemoglobin 14.5 12.0 - 15.0 g/dL   HCT 14.744.5 36 - 46 %   MCV 95.1 80.0 - 100.0 fL   MCH 31.0 26.0 - 34.0 pg   MCHC 32.6 30.0 - 36.0 g/dL   RDW 82.913.5 56.211.5 - 13.015.5 %   Platelets 247 150 - 400 K/uL   nRBC 0.0 0.0 - 0.2 %    Comment: Performed at [facility], [address] [place], [place] [address]  Urinalysis, Routine w reflex microscopic     Status: Abnormal   Collection Time: 08/18/19  8:20 PM  Result Value Ref Range   Color, Urine YELLOW YELLOW   APPearance CLEAR CLEAR   Specific Gravity, Urine >1.046 (H) 1.005 - 1.030   pH 6.0 5.0 - 8.0   Glucose, UA NEGATIVE NEGATIVE mg/dL   Hgb urine dipstick MODERATE (A) NEGATIVE   Bilirubin Urine NEGATIVE NEGATIVE   Ketones, ur NEGATIVE NEGATIVE mg/dL   Protein, ur NEGATIVE NEGATIVE mg/dL   Nitrite NEGATIVE NEGATIVE   Leukocytes,Ua NEGATIVE NEGATIVE   RBC / HPF 6-10 0 - 5 RBC/hpf   WBC, UA 0-5 0 - 5 WBC/hpf   Bacteria, UA RARE (A) NONE SEEN   Squamous Epithelial / LPF 0-5 0 - 5   Mucus PRESENT     Comment: Performed at [facility], [address] [place], [place] [address]    CT ABDOMEN PELVIS W CONTRAST  Result Date: 08/18/2019 CLINICAL DATA:  Abdominal distention with right-sided pain. EXAM: CT ABDOMEN AND PELVIS WITH CONTRAST TECHNIQUE: Multidetector CT imaging of the abdomen and pelvis was performed using the standard protocol following bolus administration of intravenous contrast. CONTRAST:  100mL OMNIPAQUE IOHEXOL 300 MG/ML  SOLN COMPARISON:  None. FINDINGS: Lower chest: Basilar atelectasis. Hepatobiliary: No  suspicious focal abnormality within the liver parenchyma. Gallbladder is markedly distended with gallbladder wall thickening and pericholecystic edema. Tiny 3 x 3 x 5 mm calcified stone identified in the region of the gallbladder neck/cystic  duct (axial image 20/series 3 and best demonstrated on coronal image 51/series 6) no intrahepatic or extrahepatic biliary dilation. Pancreas: No focal mass lesion. No dilatation of the main duct. No intraparenchymal cyst. No peripancreatic edema. Spleen: No splenomegaly. No focal mass lesion. Adrenals/Urinary Tract: No adrenal nodule or mass. Right kidney unremarkable. 8 mm exophytic lesion lower pole left kidney is technically too small to characterize but is well-defined and homogeneous low-density (coronal image 65/series 6) suggesting cyst. No evidence for hydroureter. The urinary bladder appears normal for the degree of distention. Stomach/Bowel: Large hiatal hernia with approximately 50% of the stomach in the chest. Duodenum is normally positioned as is the ligament of Treitz. Duodenal diverticulum noted. No small bowel wall thickening. No small bowel dilatation. The terminal ileum is normal. The appendix is not visualized, but there is no edema or inflammation in the region of the cecum. No gross colonic mass. No colonic wall thickening. Vascular/Lymphatic: There is abdominal aortic atherosclerosis without aneurysm. Upper normal lymph nodes identified in the hepatoduodenal ligament. No retroperitoneal lymphadenopathy. No pelvic sidewall lymphadenopathy. Reproductive: Unremarkable. Other: No intraperitoneal free fluid. Musculoskeletal: Fixation hardware noted left femur No worrisome lytic or sclerotic osseous abnormality. IMPRESSION: 1. Markedly distended gallbladder with gallbladder wall thickening and pericholecystic edema. 3 x 3 x 5 mm stone identified in the gallbladder neck/cystic duct region. Imaging features are compatible with acute cholecystitis. No evidence for  biliary duct dilatation. 2. Large hiatal hernia with approximately 50% of the stomach in the chest. 3. Aortic Atherosclerosis (ICD10-I70.0). Electronically Signed   By: Kennith Center M.D.   On: 08/18/2019 18:36   US Abdomen Limited RUQ  Result Date: 08/18/2019 CLINICAL DATA:  Right upper quadrant pain for 2 weeks EXAM: ULTRASOUND ABDOMEN LIMITED RIGHT UPPER QUADRANT COMPARISON:  CT 08/18/2019 FINDINGS: Gallbladder: No visible shadowing gallstones or gallbladder polyps. Echogenic biliary sludge is noted within the gallbladder lumen. Mild wall thickening at 3.9 mm. Trace pericholecystic fluid. Sonographic Eulah Pont sign is reportedly negative. Common bile duct: Diameter: 2.6 mm, nondilated Liver: No focal lesion identified. Within normal limits in parenchymal echogenicity. Portal vein is patent on color Doppler imaging with normal direction of blood flow towards the liver. Other: None. IMPRESSION: Biliary sludge with a thickened gallbladder wall and pericholecystic fluid. Sonographic Eulah Pont sign is absent however findings remain compatible with an acute cholecystitis in the appropriate clinical setting. Electronically Signed   By: Kreg Shropshire M.D.   On: 08/18/2019 20:08    ROS - all of the below systems have been reviewed with the patient and positives are indicated with bold text General: chills, fever or night sweats Eyes: blurry vision or double vision ENT: epistaxis or sore throat Allergy/Immunology: itchy/watery eyes or nasal congestion Hematologic/Lymphatic: bleeding problems, blood clots or swollen lymph nodes Endocrine: temperature intolerance or unexpected weight changes Breast: new or changing breast lumps or nipple discharge Resp: cough, shortness of breath, or wheezing CV: chest pain or dyspnea on exertion GI: as per HPI GU: dysuria, trouble voiding, or hematuria MSK: joint pain or joint stiffness Neuro: TIA or stroke symptoms Derm: pruritus and skin lesion changes Psych: anxiety and  depression  PE Blood pressure 115/66, pulse 78, temperature 99.1 F (37.3 C), temperature source Oral, resp. rate 16, height  (1.676 m), weight 75.8 kg, SpO2 95 %. Constitutional: NAD; conversant; no deformities Eyes: Moist conjunctiva; no lid lag; anicteric; PERRL Neck: Trachea midline; no thyromegaly Lungs: Normal respiratory effort; no tactile fremitus CV: RRR; no palpable thrills; no pitting edema GI: Abd soft,  focally ttp in RUQ with +Murphy's; no rebound/guarding elsewhere, no tenderness elsewherel; nondistended; no palpable hepatosplenomegaly MSK: Normal range of motion of extremities; no clubbing/cyanosis Psychiatric: Appropriate affect; alert and oriented x3 Lymphatic: No palpable cervical or axillary lymphadenopathy  Results for orders placed or performed during the hospital encounter of 08/18/19 (from the past 48 hour(s))  Lipase, blood     Status: None   Collection Time: 08/18/19  8:57 AM  Result Value Ref Range   Lipase 24 11 - 51 U/L    Comment: Performed at [facility], [address], [place], [place] [address]  Comprehensive metabolic panel     Status: Abnormal   Collection Time: 08/18/19  8:57 AM  Result Value Ref Range   Sodium 135 135 - 145 mmol/L   Potassium 3.9 3.5 - 5.1 mmol/L   Chloride 101 98 - 111 mmol/L   CO2 22 22 - 32 mmol/L   Glucose, Bld 120 (H) 70 - 99 mg/dL    Comment: Glucose reference range applies only to samples taken after fasting for at least 8 hours.   BUN 10 8 - 23 mg/dL   Creatinine, Ser 4.25 (H) 0.44 - 1.00 mg/dL   Calcium 9.7 8.9 - 95.6 mg/dL   Total Protein 7.2 6.5 - 8.1 g/dL   Albumin 3.8 3.5 - 5.0 g/dL   AST 27 15 - 41 U/L   ALT 25 0 - 44 U/L   Alkaline Phosphatase 85 38 - 126 U/L   Total Bilirubin 0.9 0.3 - 1.2 mg/dL   GFR calc non Af Amer 51 (L) >60 mL/min   GFR calc Af Amer 59 (L) >60 mL/min   Anion gap 12 5 - 15    Comment: Performed at [facility], [address] [place], [place] [address]  CBC      Status: Abnormal   Collection Time: 08/18/19  8:57 AM  Result Value Ref Range   WBC 11.7 (H) 4.0 - 10.5 K/uL   RBC 4.68 3.87 - 5.11 MIL/uL   Hemoglobin 14.5 12.0 - 15.0 g/dL   HCT 43.3 36 - 46 %   MCV 95.1 80.0 - 100.0 fL   MCH 31.0 26.0 - 34.0 pg   MCHC 32.6 30.0 - 36.0 g/dL   RDW 29.5 18.8 - 41.6 %   Platelets 247 150 - 400 K/uL   nRBC 0.0 0.0 - 0.2 %    Comment: Performed at [facility], [address] [place], [place] [address]  Urinalysis, Routine w reflex microscopic     Status: Abnormal   Collection Time: 08/18/19  8:20 PM  Result Value Ref Range   Color, Urine YELLOW YELLOW   APPearance CLEAR CLEAR   Specific Gravity, Urine >1.046 (H) 1.005 - 1.030   pH 6.0 5.0 - 8.0   Glucose, UA NEGATIVE NEGATIVE mg/dL   Hgb urine dipstick MODERATE (A) NEGATIVE   Bilirubin Urine NEGATIVE NEGATIVE   Ketones, ur NEGATIVE NEGATIVE mg/dL   Protein, ur NEGATIVE NEGATIVE mg/dL   Nitrite NEGATIVE NEGATIVE   Leukocytes,Ua NEGATIVE NEGATIVE   RBC / HPF 6-10 0 - 5 RBC/hpf   WBC, UA 0-5 0 - 5 WBC/hpf   Bacteria, UA RARE (A) NONE SEEN   Squamous Epithelial / LPF 0-5 0 - 5   Mucus PRESENT     Comment: Performed at [facility], [address] [place], [place] [address]    CT ABDOMEN PELVIS W CONTRAST  Result Date: 08/18/2019  CLINICAL DATA:  Abdominal distention with right-sided pain. EXAM: CT ABDOMEN AND PELVIS WITH CONTRAST TECHNIQUE: Multidetector CT imaging of the abdomen and pelvis was performed using the standard protocol following bolus administration of intravenous contrast. CONTRAST:  OMNIPAQUE IOHEXOL 300 MG/ML  SOLN COMPARISON:  None. FINDINGS: Lower chest: Basilar atelectasis. Hepatobiliary: No suspicious focal abnormality within the liver parenchyma. Gallbladder is markedly distended with gallbladder wall thickening and pericholecystic edema. Tiny 3 x 3 x 5 mm calcified stone identified in the region of the gallbladder neck/cystic duct (axial image 20/series 3 and  best demonstrated on coronal image 51/series 6) no intrahepatic or extrahepatic biliary dilation. Pancreas: No focal mass lesion. No dilatation of the main duct. No intraparenchymal cyst. No peripancreatic edema. Spleen: No splenomegaly. No focal mass lesion. Adrenals/Urinary Tract: No adrenal nodule or mass. Right kidney unremarkable. 8 mm exophytic lesion lower pole left kidney is technically too small to characterize but is well-defined and homogeneous low-density (coronal image 65/series 6) suggesting cyst. No evidence for hydroureter. The urinary bladder appears normal for the degree of distention. Stomach/Bowel: Large hiatal hernia with approximately 50% of the stomach in the chest. Duodenum is normally positioned as is the ligament of Treitz. Duodenal diverticulum noted. No small bowel wall thickening. No small bowel dilatation. The terminal ileum is normal. The appendix is not visualized, but there is no edema or inflammation in the region of the cecum. No gross colonic mass. No colonic wall thickening. Vascular/Lymphatic: There is abdominal aortic atherosclerosis without aneurysm. Upper normal lymph nodes identified in the hepatoduodenal ligament. No retroperitoneal lymphadenopathy. No pelvic sidewall lymphadenopathy. Reproductive: Unremarkable. Other: No intraperitoneal free fluid. Musculoskeletal: Fixation hardware noted left femur No worrisome lytic or sclerotic osseous abnormality. IMPRESSION: 1. Markedly distended gallbladder with gallbladder wall thickening and pericholecystic edema. 3 x 3 x 5 mm stone identified in the gallbladder neck/cystic duct region. Imaging features are compatible with acute cholecystitis. No evidence for biliary duct dilatation. 2. Large hiatal hernia with approximately 50% of the stomach in the chest. 3. Aortic Atherosclerosis (ICD10-I70.0). Electronically Signed   By: Kennith Center M.D.   On: 08/18/2019 18:36   US Abdomen Limited RUQ  Result Date: 08/18/2019 CLINICAL  DATA:  Right upper quadrant pain for 2 weeks EXAM: ULTRASOUND ABDOMEN LIMITED RIGHT UPPER QUADRANT COMPARISON:  CT 08/18/2019 FINDINGS: Gallbladder: No visible shadowing gallstones or gallbladder polyps. Echogenic biliary sludge is noted within the gallbladder lumen. Mild wall thickening at 3.9 mm. Trace pericholecystic fluid. Sonographic Eulah Pont sign is reportedly negative. Common bile duct: Diameter: 2.6 mm, nondilated Liver: No focal lesion identified. Within normal limits in parenchymal echogenicity. Portal vein is patent on color Doppler imaging with normal direction of blood flow towards the liver. Other: None. IMPRESSION: Biliary sludge with a thickened gallbladder wall and pericholecystic fluid. Sonographic Eulah Pont sign is absent however findings remain compatible with an acute cholecystitis in the appropriate clinical setting. Electronically Signed   By: Kreg Shropshire M.D.   On: 08/18/2019 20:08     A/P: Jennifer Estrada is an 74 y.o. female with acute cholecystitis  WBC 11.7 LFTs normal Lipase nml  CT which I have personally reviewed shows tense edematous gallbladder with wall thickening and pericholecystic fluid as well as gallstones in neck. Incidentally noted hiatal hernia with ~half of stomach in chest.  RUQ US shows GBW thickening, PCCF, CBD 3 mm  -Admit to floor -Clear liquids, NPO after midnight -IV abx  -The anatomy and physiology of the hepatobiliary system was discussed at length with  the patient with associated pictures. The pathophysiology of gallbladder disease was discussed as well. -The options for treatment were discussed including ongoing observation which may result in subsequent gallbladder complications (infection, pancreatitis, choledocholithiasis, etc) vs surgery this admission - cholecystectomy - laparoscopic and potential open techniques. Possible intraoperative cholangiogram. Scenarios where 'subtotal' cholecystectomy may be warranted. -The planned procedure,  material risks (including, but not limited to, pain, bleeding, infection, scarring, need for blood transfusion, damage to surrounding structures- blood vessels/nerves/viscus/organs, damage to bile duct, bile leak, need for additional procedures, hernia, worsening of pre-existing medical conditions, pancreatitis, pneumonia, heart attack, stroke, death) benefits and alternatives to surgery were discussed at length. I noted a good probability that the procedure would help improve her symptoms. The patient's questions were answered to her satisfaction, she voiced understanding and they elected to proceed with surgery. Additionally, we discussed typical postoperative expectations and the recovery process. -We reviewed timing to surgery being with my partner, Dr. Janee Morn, whom she will meet. And timing being based on OR availability and other emergent cases. We discussed this would be Thursday or even Friday but would plan for IV abx in interim.   Stephanie Coup. Cliffton Asters, M.D. [facility] Use [email or web address] to contact on call provider

## 2019-08-18 NOTE — ED Triage Notes (Signed)
Pt reports severe epigastric pain, known peptic ulcer, was seen here 2 weeks ago and told to follow up with GI but could not wait until her apt. No vomiting

## 2019-08-18 NOTE — ED Notes (Signed)
Pt transported to US

## 2019-08-18 NOTE — ED Provider Notes (Signed)
[facility]   CSN: [id number] Arrival date & time: 08/18/19  0841     History Chief Complaint  Patient presents with  . Abdominal Pain    Jennifer Estrada is a 74 y.o. female.  The history is provided by the patient.  Abdominal Pain Pain location:  Epigastric Pain quality: aching   Pain radiates to:  Does not radiate Pain severity:  Mild Onset quality:  Gradual Timing:  Intermittent Progression:  Waxing and waning Chronicity:  Recurrent Context: eating   Relieved by:  Nothing Worsened by:  Eating Associated symptoms: nausea and vomiting   Associated symptoms: no anorexia, no belching, no chest pain, no chills, no constipation, no cough, no dysuria, no fever, no hematuria, no shortness of breath and no sore throat   Risk factors: has not had multiple surgeries        Past Medical History:  Diagnosis Date  . Allergic rhinitis   . Chronic kidney disease   . Depression   . DJD (degenerative joint disease) of knee   . GERD (gastroesophageal reflux disease)   . Hyperlipidemia   . Migraine    Takes Propranolol  . Neuromuscular disorder (HCC)    Body tremors at times,h/o falling cast right arm  . Osteoporosis   . Tremors of nervous system   . Vitamin D deficiency     Patient Active Problem List   Diagnosis Date Noted  . OAB (overactive bladder) 02/24/2019  . Vitamin D deficiency   . Allergic rhinitis   . 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 Pseudophakia 02/03/2019  . Closed left hip fracture (HCC) 01/26/2019  . Tremors of nervous system   . GERD (gastroesophageal reflux disease)   . Hyperlipidemia   . Depression   . Acute blood loss anemia   . Syncope   . Pain in both knees 05/20/2017  . Patella fracture 02/12/2014  . Migraine syndrome 05/02/2012  . Anxiety attack 05/02/2012  . TIA (transient ischemic attack) 05/02/2012    Past Surgical History:  Procedure Laterality Date  . cataract surgery Left   .  INTRAMEDULLARY (IM) NAIL INTERTROCHANTERIC Left 01/27/2019   Procedure: INTRAMEDULLARY (IM) NAIL INTERTROCHANTRIC;  Surgeon: Samson FredericSwinteck, Brian, MD;  Location: [facility];  Service: Orthopedics;  Laterality: Left;  . KNEE SURGERY    . Left Foot Surgery  20 years ago   20 years ago  . ORIF PATELLA Left 02/12/2014   Procedure: OPEN REDUCTION INTERNAL (ORIF) FIXATION LEFT PATELLA;  Surgeon: Shelda PalMatthew D Olin, MD;  Location: [facility];  Service: Orthopedics;  Laterality: Left;  . PARS PLANA VITRECTOMY Left 02/21/2017   Procedure: PARS PLANA VITRECTOMY 25 GAUGE FOR ENDOPHTHALMITIS;  Surgeon: Rennis ChrisZamora, Brian, MD;  [facility];  Service: Ophthalmology;  Laterality: Left;  . WRIST FRACTURE SURGERY       OB History   No obstetric history on file.     Family History  Problem Relation Age of Onset  . Alzheimer's disease Mother   . Osteoporosis Father   . GER disease Father   . CVA Father   . Breast cancer Neg Hx     Social History   Tobacco Use  . Smoking status: Never Smoker  . Smokeless tobacco: Never Used  Vaping Use  . Vaping Use: Never used  Substance Use Topics  . Alcohol use: No  . Drug use: No    Home Medications Prior to Admission medications   Medication Sig Start Date End Date Taking? Authorizing Provider  acetaminophen (TYLENOL) 500 MG tablet Take 500 mg by mouth every 6 (six) hours as needed for mild pain or headache.    [provider]  dexlansoprazole (DEXILANT) 60 MG capsule Take 1 capsule (60 mg total) by mouth daily. 02/25/19   Medina-Vargas, Monina C, NP  docusate sodium 100 MG CAPS Take 100 mg by mouth 2 (two) times daily. 02/14/14   Lanney Gins, PA-C  enoxaparin (LOVENOX) 40 MG/0.4ML injection Inject 0.4 mLs (40 mg total) into the skin daily. 02/25/19 03/27/19  Medina-Vargas, Monina C, NP  fexofenadine (ALLEGRA) 30 MG tablet Take 30 mg by mouth as needed (allergies).     [provider]  HYDROcodone-acetaminophen (NORCO/VICODIN) 5-325 MG tablet Take 1 tablet  by mouth every 8 (eight) hours as needed for moderate pain. Patient not taking: Reported on 07/19/2019 02/25/19   Medina-Vargas, Monina C, NP  MYRBETRIQ 50 MG TB24 tablet Take 1 tablet (50 mg total) by mouth at bedtime. 02/25/19   Medina-Vargas, Monina C, NP  PARoxetine (PAXIL) 20 MG tablet Take 1 tablet (20 mg total) by mouth every morning. 02/25/19   Medina-Vargas, Monina C, NP  polyethylene glycol (MIRALAX / GLYCOLAX) 17 g packet Take 17 g by mouth daily as needed. Patient not taking: Reported on 07/19/2019 02/02/19   Lurene Shadow, MD  POLYTRIM ophthalmic solution Place 1 drop into the right eye 4 (four) times daily. Patient not taking: Reported on 05/17/2019 02/25/19   Medina-Vargas, Monina C, NP  prednisoLONE acetate (PRED FORTE) 1 % ophthalmic suspension Place 1 drop into the left eye daily. 02/25/19   Medina-Vargas, Monina C, NP  propranolol ER (INDERAL LA) 60 MG 24 hr capsule Take 1 capsule (60 mg total) by mouth daily. 02/25/19   Medina-Vargas, Monina C, NP  rosuvastatin (CRESTOR) 5 MG tablet Take 1 tablet (5 mg total) by mouth at bedtime. Patient taking differently: Take 5 mg by mouth 3 (three) times a week. Mon, Wed, Fri 02/25/19   Medina-Vargas, Monina C, NP  sucralfate (CARAFATE) 1 g tablet Take 1 tablet (1 g total) by mouth 4 (four) times daily -  with meals and at bedtime. 07/19/19   Derwood Kaplan, MD  Vitamin D, Ergocalciferol, (DRISDOL) 1.25 MG (50000 UNIT) CAPS capsule Take 1 capsule (50,000 Units total) by mouth every 7 (seven) days. Patient taking differently: Take 50,000 Units by mouth every 14 (fourteen) days.  02/25/19   Medina-Vargas, Monina C, NP    Allergies    Atorvastatin, Cortisone, Oxycodone, Paroxetine, Penicillins, Pravastatin, Singulair [montelukast], Sulfa antibiotics, Aleve [naproxen sodium], Augmentin [amoxicillin-pot clavulanate], and Prednisone  Review of Systems   Review of Systems  Constitutional: Negative for chills and fever.  HENT: Negative for ear pain and sore  throat.   Eyes: Negative for pain and visual disturbance.  Respiratory: Negative for cough and shortness of breath.   Cardiovascular: Negative for chest pain and palpitations.  Gastrointestinal: Positive for abdominal pain, nausea and vomiting. Negative for anorexia and constipation.  Genitourinary: Negative for dysuria and hematuria.  Musculoskeletal: Negative for arthralgias and back pain.  Skin: Negative for color change and rash.  Neurological: Negative for seizures and syncope.  All other systems reviewed and are negative.   Physical Exam Updated Vital Signs  ED Triage Vitals  Enc Vitals Group     BP 08/18/19 0843 (!) 111/99     Pulse Rate 08/18/19 0843 96     Resp 08/18/19 0843 20     Temp 08/18/19 0843 98.6 F (37 C)  Temp Source 08/18/19 0843 Oral     SpO2 08/18/19 0843 100 %     Weight 08/18/19 0849 167 lb (75.8 kg)     Height 08/18/19 0849 5\' 6"  (1.676 m)     Head Circumference --      Peak Flow --      Pain Score 08/18/19 0855 10     Pain Loc --      Pain Edu? --      Excl. in GC? --     Physical Exam Vitals and nursing note reviewed.  Constitutional:      General: She is not in acute distress.    Appearance: She is well-developed. She is not ill-appearing.  HENT:     Head: Normocephalic and atraumatic.  Eyes:     Extraocular Movements: Extraocular movements intact.     Conjunctiva/sclera: Conjunctivae normal.     Pupils: Pupils are equal, round, and reactive to light.  Cardiovascular:     Rate and Rhythm: Normal rate and regular rhythm.     Heart sounds: Normal heart sounds. No murmur heard.   Pulmonary:     Effort: Pulmonary effort is normal. No respiratory distress.     Breath sounds: Normal breath sounds.  Abdominal:     General: Abdomen is flat.     Palpations: Abdomen is soft.     Tenderness: There is abdominal tenderness in the right upper quadrant and epigastric area. There is guarding. There is no rebound. Negative signs include Murphy's  sign (equivical).  Musculoskeletal:     Cervical back: Neck supple.  Skin:    General: Skin is warm and dry.     Capillary Refill: Capillary refill takes less than 2 seconds.  Neurological:     General: No focal deficit present.     Mental Status: She is alert.  Psychiatric:        Mood and Affect: Mood normal.     ED Results / Procedures / Treatments   Labs (all labs ordered are listed, but only abnormal results are displayed) Labs Reviewed  COMPREHENSIVE METABOLIC PANEL - Abnormal; Notable for the following components:      Result Value   Glucose, Bld 120 (*)    Creatinine, Ser 1.08 (*)    GFR calc non Af Amer 51 (*)    GFR calc Af Amer 59 (*)    All other components within normal limits  CBC - Abnormal; Notable for the following components:   WBC 11.7 (*)    All other components within normal limits  URINALYSIS, ROUTINE W REFLEX MICROSCOPIC - Abnormal; Notable for the following components:   Specific Gravity, Urine >1.046 (*)    Hgb urine dipstick MODERATE (*)    Bacteria, UA RARE (*)    All other components within normal limits  CULTURE, BLOOD (ROUTINE X 2)  CULTURE, BLOOD (ROUTINE X 2)  SARS CORONAVIRUS 2 BY RT PCR (HOSPITAL ORDER, PERFORMED IN [facility])  LIPASE, BLOOD  LACTIC ACID, PLASMA  LACTIC ACID, PLASMA    EKG EKG Interpretation  Date/Time:  Wednesday August 18 2019 08:54:46 EDT Ventricular Rate:  68 PR Interval:  142 QRS Duration: 82 QT Interval:  396 QTC Calculation: 421 R Axis:   83 Text Interpretation: Normal sinus rhythm Nonspecific ST and T wave abnormality Abnormal ECG Confirmed by [id number] [phone]) on 08/18/2019 3:38:37 PM   Radiology CT ABDOMEN PELVIS W CONTRAST  Result Date: 08/18/2019 CLINICAL DATA:  Abdominal distention with right-sided pain. EXAM:  CT ABDOMEN AND PELVIS WITH CONTRAST TECHNIQUE: Multidetector CT imaging of the abdomen and pelvis was performed using the standard protocol following bolus administration of  intravenous contrast. CONTRAST:  OMNIPAQUE IOHEXOL 300 MG/ML  SOLN COMPARISON:  None. FINDINGS: Lower chest: Basilar atelectasis. Hepatobiliary: No suspicious focal abnormality within the liver parenchyma. Gallbladder is markedly distended with gallbladder wall thickening and pericholecystic edema. Tiny 3 x 3 x 5 mm calcified stone identified in the region of the gallbladder neck/cystic duct (axial image 20/series 3 and best demonstrated on coronal image 51/series 6) no intrahepatic or extrahepatic biliary dilation. Pancreas: No focal mass lesion. No dilatation of the main duct. No intraparenchymal cyst. No peripancreatic edema. Spleen: No splenomegaly. No focal mass lesion. Adrenals/Urinary Tract: No adrenal nodule or mass. Right kidney unremarkable. 8 mm exophytic lesion lower pole left kidney is technically too small to characterize but is well-defined and homogeneous low-density (coronal image 65/series 6) suggesting cyst. No evidence for hydroureter. The urinary bladder appears normal for the degree of distention. Stomach/Bowel: Large hiatal hernia with approximately 50% of the stomach in the chest. Duodenum is normally positioned as is the ligament of Treitz. Duodenal diverticulum noted. No small bowel wall thickening. No small bowel dilatation. The terminal ileum is normal. The appendix is not visualized, but there is no edema or inflammation in the region of the cecum. No gross colonic mass. No colonic wall thickening. Vascular/Lymphatic: There is abdominal aortic atherosclerosis without aneurysm. Upper normal lymph nodes identified in the hepatoduodenal ligament. No retroperitoneal lymphadenopathy. No pelvic sidewall lymphadenopathy. Reproductive: Unremarkable. Other: No intraperitoneal free fluid. Musculoskeletal: Fixation hardware noted left femur No worrisome lytic or sclerotic osseous abnormality. IMPRESSION: 1. Markedly distended gallbladder with gallbladder wall thickening and pericholecystic  edema. 3 x 3 x 5 mm stone identified in the gallbladder neck/cystic duct region. Imaging features are compatible with acute cholecystitis. No evidence for biliary duct dilatation. 2. Large hiatal hernia with approximately 50% of the stomach in the chest. 3. Aortic Atherosclerosis (ICD10-I70.0). Electronically Signed   By: Kennith Center M.D.   On: 08/18/2019 18:36   US Abdomen Limited RUQ  Result Date: 08/18/2019 CLINICAL DATA:  Right upper quadrant pain for 2 weeks EXAM: ULTRASOUND ABDOMEN LIMITED RIGHT UPPER QUADRANT COMPARISON:  CT 08/18/2019 FINDINGS: Gallbladder: No visible shadowing gallstones or gallbladder polyps. Echogenic biliary sludge is noted within the gallbladder lumen. Mild wall thickening at 3.9 mm. Trace pericholecystic fluid. Sonographic Eulah Pont sign is reportedly negative. Common bile duct: Diameter: 2.6 mm, nondilated Liver: No focal lesion identified. Within normal limits in parenchymal echogenicity. Portal vein is patent on color Doppler imaging with normal direction of blood flow towards the liver. Other: None. IMPRESSION: Biliary sludge with a thickened gallbladder wall and pericholecystic fluid. Sonographic Eulah Pont sign is absent however findings remain compatible with an acute cholecystitis in the appropriate clinical setting. Electronically Signed   By: Kreg Shropshire M.D.   On: 08/18/2019 20:08    Procedures .Critical Care Performed by: Virgina Norfolk, DO Authorized by: Virgina Norfolk, DO     (including critical care time)  Medications Ordered in ED Medications  cefTRIAXone (ROCEPHIN) 2 g in sodium chloride 0.9 % 100 mL IVPB (has no administration in time range)  metroNIDAZOLE (FLAGYL) IVPB 500 mg (has no administration in time range)  alum & mag hydroxide-simeth (MAALOX/MYLANTA) 200-200-20 MG/5ML suspension 30 mL (30 mLs Oral Given 08/18/19 0859)  ondansetron (ZOFRAN) injection 4 mg (4 mg Intravenous Given 08/18/19 1730)  iohexol (OMNIPAQUE) 300 MG/ML solution 100 mL (100 mLs  Intravenous Contrast Given 08/18/19 1803)  fentaNYL (SUBLIMAZE) injection 50 mcg (50 mcg Intravenous Given 08/18/19 1853)    ED Course  I have reviewed the triage vital signs and the nursing notes.  Pertinent labs & imaging results that were available during my care of the patient were reviewed by me and considered in my medical decision making (see chart for details).    MDM Rules/Calculators/A&P                          KRYSTINE PABST is a 74 year old female with history of depression, high cholesterol, reflux, CKD who presents to the ED with abdominal pain.  Normal vitals.  No fever.  On and off epigastric abdominal pain over the last several weeks but got acutely worse over the last 24 hours.  Has been taking PPI and Carafate with some improvement.  However ran out of Carafate.  Pain has gotten worse.  She is tender in epigastric region/right upper quadrant tenderness.  Gallbladder and liver enzymes overall unremarkable.  Lipase unremarkable doubt pancreatitis.  Given her discomfort we will give her IV fluids, IV Zofran, GI cocktail and evaluate with a CT of abdomen pelvis to evaluate for any type of perforation or other etiology of her pain. Possible GB pain.  Ultrasound concerning for cholecystitis.  Patient overall appears to have acute cholecystitis.  IV antibiotics have been given.  General surgery consulted and will admit for further care.  This chart was dictated using voice recognition software.  Despite best efforts to proofread,  errors can occur which can change the documentation meaning.    Final Clinical Impression(s) / ED Diagnoses Final diagnoses:  RUQ pain  Acute cholecystitis    Rx / DC Orders ED Discharge Orders    None       Virgina Norfolk, DO 08/18/19 2118

## 2019-08-19 ENCOUNTER — Inpatient Hospital Stay (HOSPITAL_COMMUNITY): Payer: Medicare Other | Admitting: Anesthesiology

## 2019-08-19 ENCOUNTER — Encounter (HOSPITAL_COMMUNITY): Payer: Self-pay

## 2019-08-19 ENCOUNTER — Other Ambulatory Visit: Payer: Self-pay

## 2019-08-19 ENCOUNTER — Encounter (HOSPITAL_COMMUNITY): Admission: EM | Disposition: A | Discharge status: Home / Self Care | Payer: Self-pay | Source: Home / Self Care

## 2019-08-19 DIAGNOSIS — K819 Cholecystitis, unspecified: Secondary | ICD-10-CM | POA: Diagnosis present

## 2019-08-19 HISTORY — PX: CHOLECYSTECTOMY: SHX55

## 2019-08-19 LAB — COMPREHENSIVE METABOLIC PANEL
ALT: 20 U/L (ref 0–44)
AST: 20 U/L (ref 15–41)
Albumin: 2.8 g/dL — ABNORMAL LOW (ref 3.5–5.0)
Alkaline Phosphatase: 63 U/L (ref 38–126)
Anion gap: 9 (ref 5–15)
BUN: 9 mg/dL (ref 8–23)
CO2: 22 mmol/L (ref 22–32)
Calcium: 8.7 mg/dL — ABNORMAL LOW (ref 8.9–10.3)
Chloride: 105 mmol/L (ref 98–111)
Creatinine, Ser: 0.88 mg/dL (ref 0.44–1.00)
GFR calc Af Amer: >60 mL/min (ref >60)
GFR calc non Af Amer: >60 mL/min (ref >60)
Glucose, Bld: 119 mg/dL — ABNORMAL HIGH (ref 70–99)
Potassium: 3.6 mmol/L (ref 3.5–5.1)
Sodium: 136 mmol/L (ref 135–145)
Total Bilirubin: 1.6 mg/dL — ABNORMAL HIGH (ref 0.3–1.2)
Total Protein: 5.6 g/dL — ABNORMAL LOW (ref 6.5–8.1)

## 2019-08-19 LAB — CBC WITH DIFFERENTIAL/PLATELET
Abs Immature Granulocytes: 0.08 10*3/uL — ABNORMAL HIGH (ref 0.00–0.07)
Basophils Absolute: 0.1 10*3/uL (ref 0.0–0.1)
Basophils Relative: 0 %
Eosinophils Absolute: 0.4 10*3/uL (ref 0.0–0.5)
Eosinophils Relative: 3 %
HCT: 40.5 % (ref 36.0–46.0)
Hemoglobin: 13.2 g/dL (ref 12.0–15.0)
Immature Granulocytes: 1 %
Lymphocytes Relative: 13 %
Lymphs Abs: 1.7 10*3/uL (ref 0.7–4.0)
MCH: 31.2 pg (ref 26.0–34.0)
MCHC: 32.6 g/dL (ref 30.0–36.0)
MCV: 95.7 fL (ref 80.0–100.0)
Monocytes Absolute: 1.4 10*3/uL — ABNORMAL HIGH (ref 0.1–1.0)
Monocytes Relative: 10 %
Neutro Abs: 10.2 10*3/uL — ABNORMAL HIGH (ref 1.7–7.7)
Neutrophils Relative %: 73 %
Platelets: 179 10*3/uL (ref 150–400)
RBC: 4.23 MIL/uL (ref 3.87–5.11)
RDW: 13.5 % (ref 11.5–15.5)
WBC: 13.8 10*3/uL — ABNORMAL HIGH (ref 4.0–10.5)
nRBC: 0 % (ref 0.0–0.2)

## 2019-08-19 SURGERY — LAPAROSCOPIC CHOLECYSTECTOMY WITH INTRAOPERATIVE CHOLANGIOGRAM
Anesthesia: General | Site: Abdomen

## 2019-08-19 MED ORDER — DIPHENHYDRAMINE HCL 50 MG/ML IJ SOLN: 12.5000 mg | Freq: Four times a day (QID) | INTRAMUSCULAR | Status: DC | PRN

## 2019-08-19 MED ORDER — LIDOCAINE 2% (20 MG/ML) 5 ML SYRINGE
INTRAMUSCULAR | Status: AC
  Filled 2019-08-19: qty 5

## 2019-08-19 MED ORDER — FENTANYL CITRATE (PF) 100 MCG/2ML IJ SOLN: 25.0000 ug | INTRAMUSCULAR | Status: DC | PRN

## 2019-08-19 MED ORDER — ONDANSETRON HCL 4 MG/2ML IJ SOLN
INTRAMUSCULAR | Status: DC | PRN
  Administered 2019-08-19: 4 mg via INTRAVENOUS

## 2019-08-19 MED ORDER — ONDANSETRON HCL 4 MG/2ML IJ SOLN
INTRAMUSCULAR | Status: AC
  Filled 2019-08-19: qty 2

## 2019-08-19 MED ORDER — MORPHINE SULFATE (PF) 4 MG/ML IV SOLN
4.0000 mg | INTRAVENOUS | Status: DC | PRN
  Administered 2019-08-19 (×2): 4 mg via INTRAVENOUS
  Filled 2019-08-19 (×2): qty 1

## 2019-08-19 MED ORDER — GABAPENTIN 300 MG PO CAPS
300.0000 mg | ORAL_CAPSULE | Freq: Two times a day (BID) | ORAL | Status: DC
  Administered 2019-08-19 – 2019-08-20 (×2): 300 mg via ORAL
  Filled 2019-08-19 (×2): qty 1

## 2019-08-19 MED ORDER — DIPHENHYDRAMINE HCL 12.5 MG/5ML PO ELIX: 12.5000 mg | ORAL_SOLUTION | Freq: Four times a day (QID) | ORAL | Status: DC | PRN

## 2019-08-19 MED ORDER — ONDANSETRON HCL 4 MG/2ML IJ SOLN
4.0000 mg | Freq: Four times a day (QID) | INTRAMUSCULAR | Status: DC | PRN
  Administered 2019-08-19: 4 mg via INTRAVENOUS
  Filled 2019-08-19: qty 2

## 2019-08-19 MED ORDER — FENTANYL CITRATE (PF) 250 MCG/5ML IJ SOLN
INTRAMUSCULAR | Status: AC
  Filled 2019-08-19: qty 5

## 2019-08-19 MED ORDER — SODIUM CHLORIDE 0.9 % IR SOLN
Status: DC | PRN
  Administered 2019-08-19: 1000 mL

## 2019-08-19 MED ORDER — CHLORHEXIDINE GLUCONATE 0.12 % MT SOLN
OROMUCOSAL | Status: AC
  Administered 2019-08-19: 15 mL via OROMUCOSAL
  Filled 2019-08-19: qty 15

## 2019-08-19 MED ORDER — PHENYLEPHRINE 40 MCG/ML (10ML) SYRINGE FOR IV PUSH (FOR BLOOD PRESSURE SUPPORT)
PREFILLED_SYRINGE | INTRAVENOUS | Status: DC | PRN
  Administered 2019-08-19: 40 ug via INTRAVENOUS

## 2019-08-19 MED ORDER — ROCURONIUM BROMIDE 10 MG/ML (PF) SYRINGE
PREFILLED_SYRINGE | INTRAVENOUS | Status: AC
  Filled 2019-08-19: qty 10

## 2019-08-19 MED ORDER — 0.9 % SODIUM CHLORIDE (POUR BTL) OPTIME
TOPICAL | Status: DC | PRN
  Administered 2019-08-19: 1000 mL

## 2019-08-19 MED ORDER — AMISULPRIDE (ANTIEMETIC) 5 MG/2ML IV SOLN
5.0000 mg | Freq: Once | INTRAVENOUS | Status: AC
  Administered 2019-08-19: 5 mg via INTRAVENOUS
  Filled 2019-08-19: qty 2

## 2019-08-19 MED ORDER — ENOXAPARIN SODIUM 40 MG/0.4ML ~~LOC~~ SOLN
40.0000 mg | SUBCUTANEOUS | Status: DC
  Administered 2019-08-20: 40 mg via SUBCUTANEOUS
  Filled 2019-08-19: qty 0.4

## 2019-08-19 MED ORDER — FENTANYL CITRATE (PF) 250 MCG/5ML IJ SOLN
INTRAMUSCULAR | Status: DC | PRN
  Administered 2019-08-19 (×2): 50 ug via INTRAVENOUS
  Administered 2019-08-19: 125 ug via INTRAVENOUS
  Administered 2019-08-19: 75 ug via INTRAVENOUS

## 2019-08-19 MED ORDER — CHLORHEXIDINE GLUCONATE 0.12 % MT SOLN: 15.0000 mL | Freq: Once | OROMUCOSAL | Status: AC

## 2019-08-19 MED ORDER — TRAMADOL HCL 50 MG PO TABS
50.0000 mg | ORAL_TABLET | Freq: Four times a day (QID) | ORAL | Status: DC | PRN
  Administered 2019-08-20: 50 mg via ORAL
  Filled 2019-08-19: qty 1

## 2019-08-19 MED ORDER — PROPOFOL 10 MG/ML IV BOLUS
INTRAVENOUS | Status: DC | PRN
  Administered 2019-08-19: 130 mg via INTRAVENOUS

## 2019-08-19 MED ORDER — SUGAMMADEX SODIUM 200 MG/2ML IV SOLN
INTRAVENOUS | Status: DC | PRN
  Administered 2019-08-19: 200 mg via INTRAVENOUS

## 2019-08-19 MED ORDER — LIDOCAINE 2% (20 MG/ML) 5 ML SYRINGE
INTRAMUSCULAR | Status: DC | PRN
  Administered 2019-08-19: 60 mg via INTRAVENOUS

## 2019-08-19 MED ORDER — ONDANSETRON 4 MG PO TBDP: 4.0000 mg | ORAL_TABLET | Freq: Four times a day (QID) | ORAL | Status: DC | PRN

## 2019-08-19 MED ORDER — POTASSIUM CHLORIDE IN NACL 20-0.9 MEQ/L-% IV SOLN
INTRAVENOUS | Status: DC
  Filled 2019-08-19 (×2): qty 1000

## 2019-08-19 MED ORDER — PROPOFOL 10 MG/ML IV BOLUS
INTRAVENOUS | Status: AC
  Filled 2019-08-19: qty 20

## 2019-08-19 MED ORDER — ROCURONIUM BROMIDE 10 MG/ML (PF) SYRINGE
PREFILLED_SYRINGE | INTRAVENOUS | Status: DC | PRN
  Administered 2019-08-19: 40 mg via INTRAVENOUS

## 2019-08-19 MED ORDER — BUPIVACAINE HCL (PF) 0.25 % IJ SOLN
INTRAMUSCULAR | Status: AC
  Filled 2019-08-19: qty 30

## 2019-08-19 MED ORDER — BUPIVACAINE HCL (PF) 0.25 % IJ SOLN
INTRAMUSCULAR | Status: DC | PRN
  Administered 2019-08-19: 21 mL

## 2019-08-19 MED ORDER — HEMOSTATIC AGENTS (NO CHARGE) OPTIME
TOPICAL | Status: DC | PRN
  Administered 2019-08-19: 1 via TOPICAL

## 2019-08-19 MED ORDER — AMISULPRIDE (ANTIEMETIC) 5 MG/2ML IV SOLN
INTRAVENOUS | Status: AC
  Filled 2019-08-19: qty 2

## 2019-08-19 MED ORDER — PROMETHAZINE HCL 25 MG/ML IJ SOLN: 6.2500 mg | INTRAMUSCULAR | Status: DC | PRN

## 2019-08-19 MED ORDER — PANTOPRAZOLE SODIUM 40 MG IV SOLR
40.0000 mg | Freq: Every day | INTRAVENOUS | Status: DC
  Administered 2019-08-19: 40 mg via INTRAVENOUS
  Filled 2019-08-19: qty 40

## 2019-08-19 SURGICAL SUPPLY — 48 items
APPLIER CLIP 5 13 M/L LIGAMAX5 (MISCELLANEOUS) ×3
BLADE CLIPPER SURG (BLADE) IMPLANT
CANISTER SUCT 3000ML PPV (MISCELLANEOUS) ×3 IMPLANT
CHLORAPREP W/TINT 26 (MISCELLANEOUS) ×3 IMPLANT
CLIP APPLIE 5 13 M/L LIGAMAX5 (MISCELLANEOUS) ×1 IMPLANT
COVER MAYO STAND STRL (DRAPES) ×3 IMPLANT
COVER SURGICAL LIGHT HANDLE (MISCELLANEOUS) ×3 IMPLANT
COVER WAND RF STERILE (DRAPES) ×3 IMPLANT
DERMABOND ADVANCED (GAUZE/BANDAGES/DRESSINGS) ×2
DERMABOND ADVANCED .7 DNX12 (GAUZE/BANDAGES/DRESSINGS) ×1 IMPLANT
DRAPE C-ARM 42X120 X-RAY (DRAPES) ×3 IMPLANT
ELECT REM PT RETURN 9FT ADLT (ELECTROSURGICAL) ×3
ELECTRODE REM PT RTRN 9FT ADLT (ELECTROSURGICAL) ×1 IMPLANT
FILTER SMOKE EVAC LAPAROSHD (FILTER) IMPLANT
GLOVE BIO SURGEON STRL SZ8 (GLOVE) ×3 IMPLANT
GLOVE BIOGEL PI IND STRL 8 (GLOVE) ×1 IMPLANT
GLOVE BIOGEL PI INDICATOR 8 (GLOVE) ×2
GOWN STRL REUS W/ TWL LRG LVL3 (GOWN DISPOSABLE) ×2 IMPLANT
GOWN STRL REUS W/ TWL XL LVL3 (GOWN DISPOSABLE) ×1 IMPLANT
GOWN STRL REUS W/TWL LRG LVL3 (GOWN DISPOSABLE) ×6
GOWN STRL REUS W/TWL XL LVL3 (GOWN DISPOSABLE) ×3
GRASPER SUT TROCAR 14GX15 (MISCELLANEOUS) ×3 IMPLANT
HEMOSTAT SNOW SURGICEL 2X4 (HEMOSTASIS) ×3 IMPLANT
KIT BASIN OR (CUSTOM PROCEDURE TRAY) ×3 IMPLANT
KIT TURNOVER KIT B (KITS) ×3 IMPLANT
L-HOOK LAP DISP 36CM (ELECTROSURGICAL) ×3
LHOOK LAP DISP 36CM (ELECTROSURGICAL) ×1 IMPLANT
NEEDLE 22X1 1/2 (OR ONLY) (NEEDLE) ×3 IMPLANT
NS IRRIG 1000ML POUR BTL (IV SOLUTION) ×3 IMPLANT
PAD ARMBOARD 7.5X6 YLW CONV (MISCELLANEOUS) ×3 IMPLANT
PENCIL BUTTON HOLSTER BLD 10FT (ELECTRODE) ×3 IMPLANT
POUCH RETRIEVAL ECOSAC 10 (ENDOMECHANICALS) ×1 IMPLANT
POUCH RETRIEVAL ECOSAC 10MM (ENDOMECHANICALS) ×3
SCISSORS LAP 5X35 DISP (ENDOMECHANICALS) ×3 IMPLANT
SET CHOLANGIOGRAPH 5 50 .035 (SET/KITS/TRAYS/PACK) ×3 IMPLANT
SET IRRIG TUBING LAPAROSCOPIC (IRRIGATION / IRRIGATOR) ×3 IMPLANT
SET TUBE SMOKE EVAC HIGH FLOW (TUBING) ×3 IMPLANT
SLEEVE ENDOPATH XCEL 5M (ENDOMECHANICALS) ×6 IMPLANT
SPECIMEN JAR SMALL (MISCELLANEOUS) ×3 IMPLANT
SUT VIC AB 4-0 PS2 18 (SUTURE) ×3 IMPLANT
SUT VIC AB 4-0 PS2 27 (SUTURE) ×3 IMPLANT
SUT VICRYL 0 UR6 27IN ABS (SUTURE) ×9 IMPLANT
TOWEL GREEN STERILE (TOWEL DISPOSABLE) ×3 IMPLANT
TOWEL GREEN STERILE FF (TOWEL DISPOSABLE) ×3 IMPLANT
TRAY LAPAROSCOPIC MC (CUSTOM PROCEDURE TRAY) ×3 IMPLANT
TROCAR XCEL BLUNT TIP 100MML (ENDOMECHANICALS) ×3 IMPLANT
TROCAR XCEL NON-BLD 5MMX100MML (ENDOMECHANICALS) ×3 IMPLANT
WATER STERILE IRR 1000ML POUR (IV SOLUTION) ×3 IMPLANT

## 2019-08-19 NOTE — Progress Notes (Signed)
Day of Surgery   Subjective/Chief Complaint: RUQ pain persists   Objective: Vital signs in last 24 hours: Temp:  [98 F (36.7 C)-100.4 F (38 C)] 99 F (37.2 C) (07/08 0729) Pulse Rate:  [62-78] 75 (07/08 1012) Resp:  [16-20] 16 (07/08 1012) BP: (113-170)/(60-104) 116/64 (07/08 1012) SpO2:  [92 %-100 %] 96 % (07/08 1012) Weight:  [75.8 kg] 75.8 kg (07/08 1018)    Intake/Output from previous day: No intake/output data recorded. Intake/Output this shift: No intake/output data recorded.  General appearance: cooperative Resp: clear to auscultation bilaterally Cardio: regular rate and rhythm GI: soft, quite tender RUQ  Lab Results:  Recent Labs    08/18/19 0857 08/19/19 0749  WBC 11.7* 13.8*  HGB 14.5 13.2  HCT 44.5 40.5  PLT 247 179   BMET Recent Labs    08/18/19 0857 08/19/19 0749  NA 135 136  K 3.9 3.6  CL 101 105  CO2 22 22  GLUCOSE 120* 119*  BUN 10 9  CREATININE 1.08* 0.88  CALCIUM 9.7 8.7*   PT/INR No results for input(s): LABPROT, INR in the last 72 hours. ABG No results for input(s): PHART, HCO3 in the last 72 hours.  Invalid input(s): PCO2, PO2  Studies/Results: CT ABDOMEN PELVIS W CONTRAST  Result Date: 08/18/2019 CLINICAL DATA:  Abdominal distention with right-sided pain. EXAM: CT ABDOMEN AND PELVIS WITH CONTRAST TECHNIQUE: Multidetector CT imaging of the abdomen and pelvis was performed using the standard protocol following bolus administration of intravenous contrast. CONTRAST:  OMNIPAQUE IOHEXOL 300 MG/ML  SOLN COMPARISON:  None. FINDINGS: Lower chest: Basilar atelectasis. Hepatobiliary: No suspicious focal abnormality within the liver parenchyma. Gallbladder is markedly distended with gallbladder wall thickening and pericholecystic edema. Tiny 3 x 3 x 5 mm calcified stone identified in the region of the gallbladder neck/cystic duct (axial image 20/series 3 and best demonstrated on coronal image 51/series 6) no intrahepatic or extrahepatic  biliary dilation. Pancreas: No focal mass lesion. No dilatation of the main duct. No intraparenchymal cyst. No peripancreatic edema. Spleen: No splenomegaly. No focal mass lesion. Adrenals/Urinary Tract: No adrenal nodule or mass. Right kidney unremarkable. 8 mm exophytic lesion lower pole left kidney is technically too small to characterize but is well-defined and homogeneous low-density (coronal image 65/series 6) suggesting cyst. No evidence for hydroureter. The urinary bladder appears normal for the degree of distention. Stomach/Bowel: Large hiatal hernia with approximately 50% of the stomach in the chest. Duodenum is normally positioned as is the ligament of Treitz. Duodenal diverticulum noted. No small bowel wall thickening. No small bowel dilatation. The terminal ileum is normal. The appendix is not visualized, but there is no edema or inflammation in the region of the cecum. No gross colonic mass. No colonic wall thickening. Vascular/Lymphatic: There is abdominal aortic atherosclerosis without aneurysm. Upper normal lymph nodes identified in the hepatoduodenal ligament. No retroperitoneal lymphadenopathy. No pelvic sidewall lymphadenopathy. Reproductive: Unremarkable. Other: No intraperitoneal free fluid. Musculoskeletal: Fixation hardware noted left femur No worrisome lytic or sclerotic osseous abnormality. IMPRESSION: 1. Markedly distended gallbladder with gallbladder wall thickening and pericholecystic edema. 3 x 3 x 5 mm stone identified in the gallbladder neck/cystic duct region. Imaging features are compatible with acute cholecystitis. No evidence for biliary duct dilatation. 2. Large hiatal hernia with approximately 50% of the stomach in the chest. 3. Aortic Atherosclerosis (ICD10-I70.0). Electronically Signed   By: Kennith Center M.D.   On: 08/18/2019 18:36   US Abdomen Limited RUQ  Result Date: 08/18/2019 CLINICAL DATA:  Right upper  quadrant pain for 2 weeks EXAM: ULTRASOUND ABDOMEN LIMITED RIGHT  UPPER QUADRANT COMPARISON:  CT 08/18/2019 FINDINGS: Gallbladder: No visible shadowing gallstones or gallbladder polyps. Echogenic biliary sludge is noted within the gallbladder lumen. Mild wall thickening at 3.9 mm. Trace pericholecystic fluid. Sonographic Eulah Pont sign is reportedly negative. Common bile duct: Diameter: 2.6 mm, nondilated Liver: No focal lesion identified. Within normal limits in parenchymal echogenicity. Portal vein is patent on color Doppler imaging with normal direction of blood flow towards the liver. Other: None. IMPRESSION: Biliary sludge with a thickened gallbladder wall and pericholecystic fluid. Sonographic Eulah Pont sign is absent however findings remain compatible with an acute cholecystitis in the appropriate clinical setting. Electronically Signed   By: Kreg Shropshire M.D.   On: 08/18/2019 20:08    Anti-infectives: Anti-infectives (From admission, onward)   Start     Dose/Rate Route Frequency Ordered Stop   08/18/19 2331  ciprofloxacin (CIPRO) IVPB 400 mg     Discontinue    "And" Linked Group Details   400 mg 200 mL/hr over 60 Minutes Intravenous Every 12 hours 08/18/19 2331     08/18/19 2331  metroNIDAZOLE (FLAGYL) IVPB 500 mg     Discontinue    "And" Linked Group Details   500 mg 100 mL/hr over 60 Minutes Intravenous Every 8 hours 08/18/19 2331     08/18/19 2000  cefTRIAXone (ROCEPHIN) 2 g in sodium chloride 0.9 % 100 mL IVPB  Status:  Discontinued        2 g 200 mL/hr over 30 Minutes Intravenous Every 24 hours 08/18/19 1918 08/18/19 2331   08/18/19 2000  metroNIDAZOLE (FLAGYL) IVPB 500 mg  Status:  Discontinued        500 mg 100 mL/hr over 60 Minutes Intravenous Every 8 hours 08/18/19 1918 08/18/19 2331      Assessment/Plan: Cholecystitis - to OR for laparoscopic cholecystectomy, possible cholangiogram.  I discussed the procedure, risks, and benefits with her.  I also discussed the expected postoperative course.  She is agreeable.  On Cipro and Flagyl IV.  LOS: 1  day    Liz Malady 08/19/2019

## 2019-08-19 NOTE — Anesthesia Procedure Notes (Signed)
Procedure Name: Intubation Date/Time: 08/19/2019 11:18 AM Performed by: Kyung Rudd, CRNA Pre-anesthesia Checklist: Patient identified, Emergency Drugs available, Suction available and Patient being monitored Patient Re-evaluated:Patient Re-evaluated prior to induction Oxygen Delivery Method: Circle System Utilized Preoxygenation: Pre-oxygenation with 100% oxygen Induction Type: IV induction Ventilation: Mask ventilation without difficulty Laryngoscope Size: Mac and 3 Grade View: Grade I Tube type: Oral Tube size: 7.0 mm Number of attempts: 1 Airway Equipment and Method: Stylet Placement Confirmation: ETT inserted through vocal cords under direct vision,  positive ETCO2 and breath sounds checked- equal and bilateral Secured at: 21 cm Tube secured with: Tape Dental Injury: Teeth and Oropharynx as per pre-operative assessment

## 2019-08-19 NOTE — Transfer of Care (Signed)
Immediate Anesthesia Transfer of Care Note  Patient: Jennifer Estrada  Procedure(s) Performed: LAPAROSCOPIC CHOLECYSTECTOMY (N/A Abdomen)  Patient Location: PACU  Anesthesia Type:General  Level of Consciousness: drowsy  Airway & Oxygen Therapy: Patient Spontanous Breathing and Patient connected to face mask oxygen  Post-op Assessment: Report given to RN, Post -op Vital signs reviewed and stable and Patient moving all extremities  Post vital signs: Reviewed and stable  Last Vitals:  Vitals Value Taken Time  BP 169/98 08/19/19 1248  Temp    Pulse 73 08/19/19 1255  Resp 18 08/19/19 1255  SpO2 98 % 08/19/19 1255  Vitals shown include unvalidated device data.  Last Pain:  Vitals:   08/19/19 1018  TempSrc:   PainSc: 2       Patients Stated Pain Goal: 3 (08/19/19 1018)  Complications: No complications documented.

## 2019-08-19 NOTE — Progress Notes (Signed)
Called husband to come to room.

## 2019-08-19 NOTE — ED Notes (Signed)
Assisted up to bathroom.

## 2019-08-19 NOTE — Progress Notes (Signed)
Pt admitted to [id number] s/p Lap chole from PACU, Dr. Janee Morn.  Pt sleepy but arouseable.  No c/o pain at present.

## 2019-08-19 NOTE — Op Note (Signed)
  08/18/2019 - 08/19/2019  12:23 PM  PATIENT:  Jennifer Estrada  74 y.o. female  PRE-OPERATIVE DIAGNOSIS:  cholecystitis  POST-OPERATIVE DIAGNOSIS:  Cholecystitis with empyema of the gallbladder  PROCEDURE:  Procedure(s): LAPAROSCOPIC CHOLECYSTECTOMY  SURGEON:  Surgeon(s): Violeta Gelinas, MD  ASSISTANTS: none   ANESTHESIA:   local and general  EBL:  No intake/output data recorded.  BLOOD ADMINISTERED:none  DRAINS: none   SPECIMEN:  Excision  DISPOSITION OF SPECIMEN:  PATHOLOGY  COUNTS:  YES  DICTATION: .Dragon Dictation Findings: Severe acute cholecystitis  Procedure in detail: Informed consent was obtained.  She received intravenous antibiotics.  She was brought to the operating room and general endotracheal anesthesia was administered by the anesthesia staff.  Her abdomen was prepped and draped in a sterile fashion.The infraumbilical region was infiltrated with local. Infraumbilical incision was made. Subcutaneous tissues were dissected down revealing the anterior fascia. This was divided sharply along the midline. Peritoneal cavity was entered under direct vision without complication. A 0 Vicryl pursestring was placed around the fascial opening. Hassan trocar was inserted into the abdomen. The abdomen was insufflated with carbon dioxide in standard fashion. Under direct vision a 5 mm epigastric and 5 mm right abdominal port x2 were placed.  Local was used at each port site.  Laparoscopic exploration revealed a very inflamed gallbladder encased in omentum.  The omentum was gradually dissected away from the gallbladder.  The gallbladder was tense and distended.  It was drained with a Nahzat suction.  This facilitated grasping the dome of the gallbladder and retracting it superior medially.  The infundibulum was retracted inferior laterally.  Dissection began laterally and progressed medially identifying the cystic duct and cystic artery.  Dissection of the cystic duct continue to I  had an excellent critical view of safety.  3 clips were placed proximally and one was placed distally and it was divided.  The cystic artery was clipped twice proximally and divided distally with cautery.  The gallbladder was removed from the liver bed using cautery and achieving excellent hemostasis.  It was placed in a bag and removed from the abdomen.  It was sent to pathology. The liver bed was cauterized to get good hemostasis.  The area was thoroughly irrigated.  I then placed a piece of Surgicel snow in the liver bed.  At this point there was good hemostasis & the liver bed was dry.  Clips remain in good position.Next, I removed the infraumbilical port and tied the pursestring to close the fascia.  Under direct vision I then used the PMI to place 2 additional 0 Vicryl sutures to reinforce the umbilical closure.  Pneumoperitoneum was released.  All of the wounds were irrigated and the skin of each was closed with running 4-0 Vicryl followed by Dermabond.  All counts were correct.  She tolerated procedure well without apparent complication was taken recovery in stable condition.  PATIENT DISPOSITION:  PACU - hemodynamically stable.   Delay start of Pharmacological VTE agent (>24hrs) due to surgical blood loss or risk of bleeding:  no  Violeta Gelinas, MD, MPH, FACS Pager: [phone]  7/8/202112:23 PM

## 2019-08-19 NOTE — ED Notes (Addendum)
Report called to short stay, all belongings given to husband. 2 pr. Earrings in belonging bag. Sent to bay 36. Consent signed.

## 2019-08-19 NOTE — Anesthesia Preprocedure Evaluation (Signed)
Anesthesia Evaluation  Patient identified by MRN, date of birth, ID band Patient awake    Reviewed: Allergy & Precautions, NPO status , Patient's Chart, lab work & pertinent test results  Airway Mallampati: II  TM Distance: >3 FB Neck ROM: Full    Dental  (+) Dental Advisory Given   Pulmonary neg pulmonary ROS,    breath sounds clear to auscultation       Cardiovascular negative cardio ROS   Rhythm:Regular Rate:Normal     Neuro/Psych  Headaches, TIA Neuromuscular disease    GI/Hepatic Neg liver ROS, GERD  ,  Endo/Other  negative endocrine ROS  Renal/GU CRFRenal disease     Musculoskeletal  (+) Arthritis ,   Abdominal   Peds  Hematology negative hematology ROS (+)   Anesthesia Other Findings   Reproductive/Obstetrics                             Anesthesia Physical Anesthesia Plan  ASA: II  Anesthesia Plan: General   Post-op Pain Management:    Induction: Intravenous  PONV Risk Score and Plan: 3 and Ondansetron, Dexamethasone and Treatment may vary due to age or medical condition  Airway Management Planned: Oral ETT  Additional Equipment: None  Intra-op Plan:   Post-operative Plan: Extubation in OR  Informed Consent: I have reviewed the patients History and Physical, chart, labs and discussed the procedure including the risks, benefits and alternatives for the proposed anesthesia with the patient or authorized representative who has indicated his/her understanding and acceptance.     Dental advisory given  Plan Discussed with: CRNA  Anesthesia Plan Comments:         Anesthesia Quick Evaluation

## 2019-08-19 NOTE — Anesthesia Postprocedure Evaluation (Signed)
Anesthesia Post Note  Patient: Jennifer Estrada  Procedure(s) Performed: LAPAROSCOPIC CHOLECYSTECTOMY (N/A Abdomen)     Patient location during evaluation: PACU Anesthesia Type: General Level of consciousness: awake and alert Pain management: pain level controlled Vital Signs Assessment: post-procedure vital signs reviewed and stable Respiratory status: spontaneous breathing, nonlabored ventilation, respiratory function stable and patient connected to nasal cannula oxygen Cardiovascular status: blood pressure returned to baseline and stable Postop Assessment: no apparent nausea or vomiting Anesthetic complications: no   No complications documented.  Last Vitals:  Vitals:   08/19/19 1411 08/19/19 1510  BP: (!) 158/90 (!) 145/82  Pulse: 69 75  Resp: 18 20  Temp: 36.7 C 37.1 C  SpO2: 94% 94%    Last Pain:  Vitals:   08/19/19 1529  TempSrc:   PainSc: 6                  Kennieth Rad

## 2019-08-20 ENCOUNTER — Encounter (HOSPITAL_COMMUNITY): Payer: Self-pay | Admitting: General Surgery

## 2019-08-20 LAB — COMPREHENSIVE METABOLIC PANEL
ALT: 31 U/L (ref 0–44)
AST: 43 U/L — ABNORMAL HIGH (ref 15–41)
Albumin: 2.6 g/dL — ABNORMAL LOW (ref 3.5–5.0)
Alkaline Phosphatase: 59 U/L (ref 38–126)
Anion gap: 9 (ref 5–15)
BUN: 11 mg/dL (ref 8–23)
CO2: 22 mmol/L (ref 22–32)
Calcium: 8.7 mg/dL — ABNORMAL LOW (ref 8.9–10.3)
Chloride: 105 mmol/L (ref 98–111)
Creatinine, Ser: 0.98 mg/dL (ref 0.44–1.00)
GFR calc Af Amer: >60 mL/min (ref >60)
GFR calc non Af Amer: 57 mL/min — ABNORMAL LOW (ref >60)
Glucose, Bld: 97 mg/dL (ref 70–99)
Potassium: 4 mmol/L (ref 3.5–5.1)
Sodium: 136 mmol/L (ref 135–145)
Total Bilirubin: 1.6 mg/dL — ABNORMAL HIGH (ref 0.3–1.2)
Total Protein: 5.4 g/dL — ABNORMAL LOW (ref 6.5–8.1)

## 2019-08-20 LAB — SURGICAL PATHOLOGY

## 2019-08-20 LAB — CBC
HCT: 40 % (ref 36.0–46.0)
Hemoglobin: 13 g/dL (ref 12.0–15.0)
MCH: 31.9 pg (ref 26.0–34.0)
MCHC: 32.5 g/dL (ref 30.0–36.0)
MCV: 98 fL (ref 80.0–100.0)
Platelets: 162 10*3/uL (ref 150–400)
RBC: 4.08 MIL/uL (ref 3.87–5.11)
RDW: 14 % (ref 11.5–15.5)
WBC: 13.7 10*3/uL — ABNORMAL HIGH (ref 4.0–10.5)
nRBC: 0 % (ref 0.0–0.2)

## 2019-08-20 LAB — LIPASE, BLOOD: Lipase: 20 U/L (ref 11–51)

## 2019-08-20 MED ORDER — ACETAMINOPHEN 325 MG PO TABS
650.0000 mg | ORAL_TABLET | ORAL | Status: DC | PRN
  Administered 2019-08-20: 650 mg via ORAL
  Filled 2019-08-20: qty 2

## 2019-08-20 MED ORDER — TRAMADOL HCL 50 MG PO TABS: 50.0000 mg | ORAL_TABLET | Freq: Four times a day (QID) | ORAL | 0 refills | Status: DC | PRN

## 2019-08-20 MED ORDER — LIDOCAINE 5 % EX PTCH
1.0000 | MEDICATED_PATCH | CUTANEOUS | Status: DC
  Administered 2019-08-20: 1 via TRANSDERMAL
  Filled 2019-08-20: qty 1

## 2019-08-20 MED ORDER — ACETAMINOPHEN 500 MG PO TABS: 500.0000 mg | ORAL_TABLET | Freq: Four times a day (QID) | ORAL | 0 refills | Status: DC | PRN

## 2019-08-20 MED ORDER — PANTOPRAZOLE SODIUM 40 MG PO TBEC: 40.0000 mg | DELAYED_RELEASE_TABLET | Freq: Every day | ORAL | Status: DC

## 2019-08-20 MED ORDER — METHOCARBAMOL 500 MG PO TABS: 500.0000 mg | ORAL_TABLET | Freq: Four times a day (QID) | ORAL | Status: DC | PRN

## 2019-08-20 MED ORDER — ACETAMINOPHEN 500 MG PO TABS
1000.0000 mg | ORAL_TABLET | Freq: Four times a day (QID) | ORAL | Status: DC
  Administered 2019-08-20: 1000 mg via ORAL
  Filled 2019-08-20: qty 2

## 2019-08-20 MED ORDER — LIDOCAINE 5 % EX PTCH: 1.0000 | MEDICATED_PATCH | CUTANEOUS | 0 refills | Status: DC

## 2019-08-20 MED ORDER — PAROXETINE HCL 20 MG PO TABS
20.0000 mg | ORAL_TABLET | Freq: Every day | ORAL | Status: DC
  Administered 2019-08-20: 20 mg via ORAL
  Filled 2019-08-20: qty 1

## 2019-08-20 NOTE — Discharge Instructions (Signed)
[facility] LAPAROSCOPIC SURGERY: POST OP INSTRUCTIONS Always review your discharge instruction sheet given to you by the facility where your surgery was performed. IF YOU HAVE DISABILITY OR FAMILY LEAVE FORMS, YOU MUST BRING THEM TO THE OFFICE FOR PROCESSING.   DO NOT GIVE THEM TO YOUR DOCTOR.  PAIN CONTROL  1. First take acetaminophen (Tylenol) AND/or ibuprofen (Advil) to control your pain after surgery.  Follow directions on package.  Taking acetaminophen (Tylenol) and/or ibuprofen (Advil) regularly after surgery will help to control your pain and lower the amount of prescription pain medication you may need.  You should not take more than 3,000 mg (3 grams) of acetaminophen (Tylenol) in 24 hours.  You should not take ibuprofen (Advil), aleve, motrin, naprosyn or other NSAIDS if you have a history of stomach ulcers or chronic kidney disease.  2. A prescription for pain medication may be given to you upon discharge.  Take your pain medication as prescribed, if you still have uncontrolled pain after taking acetaminophen (Tylenol) or ibuprofen (Advil). 3. Use ice packs to help control pain. 4. If you need a refill on your pain medication, please contact your pharmacy.  They will contact our office to request authorization. Prescriptions will not be filled after 5pm or on week-ends.  HOME MEDICATIONS 5. Take your usually prescribed medications unless otherwise directed.  DIET 6. You should follow a light diet the first few days after arrival home.  Be sure to include lots of fluids daily. Avoid fatty, fried foods.   CONSTIPATION 7. It is common to experience some constipation after surgery and if you are taking pain medication.  Increasing fluid intake and taking a stool softener (such as Colace) will usually help or prevent this problem from occurring.  A mild laxative (Milk of Magnesia or Miralax) should be taken according to package instructions if there are no bowel  movements after 48 hours.  WOUND/INCISION CARE 8. Most patients will experience some swelling and bruising in the area of the incisions.  Ice packs will help.  Swelling and bruising can take several days to resolve.  9. Unless discharge instructions indicate otherwise, follow guidelines below  a. STERI-STRIPS - you may remove your outer bandages 48 hours after surgery, and you may shower at that time.  You have steri-strips (small skin tapes) in place directly over the incision.  These strips should be left on the skin for 7-10 days.   b. DERMABOND/SKIN GLUE - you may shower in 24 hours.  The glue will flake off over the next 2-3 weeks. 10. Any sutures or staples will be removed at the office during your follow-up visit.  ACTIVITIES 11. You may resume regular (light) daily activities beginning the next day--such as daily self-care, walking, climbing stairs--gradually increasing activities as tolerated.  You may have sexual intercourse when it is comfortable.  Refrain from any heavy lifting or straining until approved by your doctor. a. You may drive when you are no longer taking prescription pain medication, you can comfortably wear a seatbelt, and you can safely maneuver your car and apply brakes.  FOLLOW-UP 12. You should see your doctor in the office for a follow-up appointment approximately 2-3 weeks after your surgery.  You should have been given your post-op/follow-up appointment when your surgery was scheduled.  If you did not receive a post-op/follow-up appointment, make sure that you call for this appointment within a day or two after you arrive home to insure a convenient appointment time.  WHEN   TO CALL YOUR DOCTOR: 1. Fever over 101.0 2. Inability to urinate 3. Continued bleeding from incision. 4. Increased pain, redness, or drainage from the incision. 5. Increasing abdominal pain  The clinic staff is available to answer your questions during regular business hours.  Please don't  hesitate to call and ask to speak to one of the nurses for clinical concerns.  If you have a medical emergency, go to the nearest emergency room or call 911.  A surgeon from [facility] is always on call at the hospital. [address], [address], [place], [place]  [address] ? [address] [address], [place], [place]   [address] [phone] ? [phone] ? FAX [phone] Web site: [email or web address]  

## 2019-08-20 NOTE — Progress Notes (Signed)
[facility] Progress Note  1 Day Post-Op  Subjective: CC:  Up in chair. C/o abdominal pain and appears uncomfortable. Also c/o ripping back pain. Tolerating PO and drinking a lot of water/ginger ale because she is thirsty. Denies flatus or BM. Voiding using external cath. Reports history of hip surgery 01/2019 so she has 3-in-1, walker, and cane at home.   Objective: Vital signs in last 24 hours: Temp:  [97.6 F (36.4 C)-98.8 F (37.1 C)] 98.6 F (37 C) (07/09 0405) Pulse Rate:  [69-88] 88 (07/09 0405) Resp:  [10-20] 14 (07/09 0405) BP: (101-173)/(54-103) 101/54 (07/09 0405) SpO2:  [86 %-100 %] 91 % (07/09 0600) Last BM Date: 08/18/19  Intake/Output from previous day: 07/08 0701 - 07/09 0700 In: 2031.3 [P.O.:100; I.V.:1631.3; IV Piggyback:300] Out: 650 [Urine:600; Blood:50] Intake/Output this shift: Total I/O In: 1409.8 [P.O.:200; I.V.:1209.8] Out: 300 [Urine:300]  PE: Gen:  Alert, NAD, pleasant Card:  Regular rate and rhythm, pedal pulses 2+ BL Pulm:  Normal effort, splinting breathing 2/2 pain, pulled <200 on IS Abd: Soft, appropriately tender, mild distention, incisions c/d/i with some surrounding ecchymosis Skin: warm and dry, no rashes  Psych: A&Ox3   Lab Results:  Recent Labs    08/19/19 0749 08/20/19 0328  WBC 13.8* 13.7*  HGB 13.2 13.0  HCT 40.5 40.0  PLT 179 162   BMET Recent Labs    08/19/19 0749 08/20/19 0328  NA 136 136  K 3.6 4.0  CL 105 105  CO2 22 22  GLUCOSE 119* 97  BUN 9 11  CREATININE 0.88 0.98  CALCIUM 8.7* 8.7*   PT/INR No results for input(s): LABPROT, INR in the last 72 hours. CMP     Component Value Date/Time   NA 136 08/20/2019 0328   K 4.0 08/20/2019 0328   CL 105 08/20/2019 0328   CO2 22 08/20/2019 0328   GLUCOSE 97 08/20/2019 0328   BUN 11 08/20/2019 0328   CREATININE 0.98 08/20/2019 0328   CALCIUM 8.7 (L) 08/20/2019 0328   PROT 5.4 (L) 08/20/2019 0328   ALBUMIN 2.6 (L) 08/20/2019 0328   AST 43 (H)  08/20/2019 0328   ALT 31 08/20/2019 0328   ALKPHOS 59 08/20/2019 0328   BILITOT 1.6 (H) 08/20/2019 0328   GFRNONAA 57 (L) 08/20/2019 0328   GFRAA >60 08/20/2019 0328   Lipase     Component Value Date/Time   LIPASE 24 08/18/2019 0857       Studies/Results: CT ABDOMEN PELVIS W CONTRAST  Result Date: 08/18/2019 CLINICAL DATA:  Abdominal distention with right-sided pain. EXAM: CT ABDOMEN AND PELVIS WITH CONTRAST TECHNIQUE: Multidetector CT imaging of the abdomen and pelvis was performed using the standard protocol following bolus administration of intravenous contrast. CONTRAST:  OMNIPAQUE IOHEXOL 300 MG/ML  SOLN COMPARISON:  None. FINDINGS: Lower chest: Basilar atelectasis. Hepatobiliary: No suspicious focal abnormality within the liver parenchyma. Gallbladder is markedly distended with gallbladder wall thickening and pericholecystic edema. Tiny 3 x 3 x 5 mm calcified stone identified in the region of the gallbladder neck/cystic duct (axial image 20/series 3 and best demonstrated on coronal image 51/series 6) no intrahepatic or extrahepatic biliary dilation. Pancreas: No focal mass lesion. No dilatation of the main duct. No intraparenchymal cyst. No peripancreatic edema. Spleen: No splenomegaly. No focal mass lesion. Adrenals/Urinary Tract: No adrenal nodule or mass. Right kidney unremarkable. 8 mm exophytic lesion lower pole left kidney is technically too small to characterize but is well-defined and homogeneous low-density (coronal image 65/series 6) suggesting cyst.  No evidence for hydroureter. The urinary bladder appears normal for the degree of distention. Stomach/Bowel: Large hiatal hernia with approximately 50% of the stomach in the chest. Duodenum is normally positioned as is the ligament of Treitz. Duodenal diverticulum noted. No small bowel wall thickening. No small bowel dilatation. The terminal ileum is normal. The appendix is not visualized, but there is no edema or inflammation in  the region of the cecum. No gross colonic mass. No colonic wall thickening. Vascular/Lymphatic: There is abdominal aortic atherosclerosis without aneurysm. Upper normal lymph nodes identified in the hepatoduodenal ligament. No retroperitoneal lymphadenopathy. No pelvic sidewall lymphadenopathy. Reproductive: Unremarkable. Other: No intraperitoneal free fluid. Musculoskeletal: Fixation hardware noted left femur No worrisome lytic or sclerotic osseous abnormality. IMPRESSION: 1. Markedly distended gallbladder with gallbladder wall thickening and pericholecystic edema. 3 x 3 x 5 mm stone identified in the gallbladder neck/cystic duct region. Imaging features are compatible with acute cholecystitis. No evidence for biliary duct dilatation. 2. Large hiatal hernia with approximately 50% of the stomach in the chest. 3. Aortic Atherosclerosis (ICD10-I70.0). Electronically Signed   By: Kennith Center M.D.   On: 08/18/2019 18:36   US Abdomen Limited RUQ  Result Date: 08/18/2019 CLINICAL DATA:  Right upper quadrant pain for 2 weeks EXAM: ULTRASOUND ABDOMEN LIMITED RIGHT UPPER QUADRANT COMPARISON:  CT 08/18/2019 FINDINGS: Gallbladder: No visible shadowing gallstones or gallbladder polyps. Echogenic biliary sludge is noted within the gallbladder lumen. Mild wall thickening at 3.9 mm. Trace pericholecystic fluid. Sonographic Eulah Pont sign is reportedly negative. Common bile duct: Diameter: 2.6 mm, nondilated Liver: No focal lesion identified. Within normal limits in parenchymal echogenicity. Portal vein is patent on color Doppler imaging with normal direction of blood flow towards the liver. Other: None. IMPRESSION: Biliary sludge with a thickened gallbladder wall and pericholecystic fluid. Sonographic Eulah Pont sign is absent however findings remain compatible with an acute cholecystitis in the appropriate clinical setting. Electronically Signed   By: Kreg Shropshire M.D.   On: 08/18/2019 20:08    Anti-infectives: Anti-infectives  (From admission, onward)   Start     Dose/Rate Route Frequency Ordered Stop   08/18/19 2331  ciprofloxacin (CIPRO) IVPB 400 mg  Status:  Discontinued       "And" Linked Group Details   400 mg 200 mL/hr over 60 Minutes Intravenous Every 12 hours 08/18/19 2331 08/19/19 1359   08/18/19 2331  metroNIDAZOLE (FLAGYL) IVPB 500 mg  Status:  Discontinued       "And" Linked Group Details   500 mg 100 mL/hr over 60 Minutes Intravenous Every 8 hours 08/18/19 2331 08/19/19 1359   08/18/19 2000  cefTRIAXone (ROCEPHIN) 2 g in sodium chloride 0.9 % 100 mL IVPB  Status:  Discontinued        2 g 200 mL/hr over 30 Minutes Intravenous Every 24 hours 08/18/19 1918 08/18/19 2331   08/18/19 2000  metroNIDAZOLE (FLAGYL) IVPB 500 mg  Status:  Discontinued        500 mg 100 mL/hr over 60 Minutes Intravenous Every 8 hours 08/18/19 1918 08/18/19 2331      Assessment/Plan HLD GERD - PPI Depression - home medication  Acute cholecystitis POD#1 s/p laparoscopic cholecystectomy 08/19/19 Dr. Janee Morn - afebrile, VSS, WBC 13 from 13 (stable)  - bilirubin 1.6 from 1.6, stable, add on lipase due to severe back pain - poor pain control - schedule tylenol, add PRN robaxin, add lidoderm patch, continue PRN tramadol  - PT eval  - IS 10x q 1h   FEN: HH diet, drinking  well so will continue to hold IVF ID: perioperative rocephin x1 dose VTE: SCD's, lovenox Foley: external cath  Dispo: PT eval, increase PO pain control, anticipate discharge home this afternoon or tomorrow    LOS: 2 days    Hosie Spangle, [facility] Please see Amion for pager number during day hours 7:00am-4:30pm

## 2019-08-20 NOTE — Progress Notes (Signed)
Pt has had good pain relief with tylenol, will keep administering it as ordered. Applied Lidoderm patch to abdomen. Started home med Paxil as ordered. Husband at bedside.

## 2019-08-20 NOTE — TOC Initial Note (Signed)
Transition of Care [facility]) - Initial/Assessment Note    Patient Details  Name: Jennifer Estrada MRN: [id number] Date of Birth: 06/19/1945  [facility]) CM/SW Contact:    Kingsley Plan, RN Phone Number: 08/20/2019, 3:06 PM  Clinical Narrative:                 Patient from home with husband Jennifer Estrada. Spoke to patient and Jennifer Estrada at bedside. Discussed PT recommendation for HHPT. Patient voiced understanding but declined at this time.   NCM provided and left a Medicare.gov home health list at bedside in case patient changes her mind. Instructed patient to left her nurse know if she does decide on [facility] prior to discharge. Patient voiced understanding.   Patient already has 3 in 1 walker and cane at home.   Expected Discharge Plan: Home/Self Care Barriers to Discharge: Continued Medical Work up   Patient Goals and CMS Choice Patient states their goals for this hospitalization and ongoing recovery are:: to return to home CMS Medicare.gov Compare Post Acute Care list provided to:: Patient Choice offered to / list presented to : Patient, Spouse  Expected Discharge Plan and Services Expected Discharge Plan: Home/Self Care   Discharge Planning Services: CM Consult Post Acute Care Choice: Home Health Living arrangements for the past 2 months: Single Family Home                 DME Arranged: N/A DME Agency: NA       HH Arranged: Refused HH          Prior Living Arrangements/Services Living arrangements for the past 2 months: Single Family Home Lives with:: Spouse Patient language and need for interpreter reviewed:: Yes        Need for Family Participation in Patient Care: Yes (Comment) Care giver support system in place?: Yes (comment) Current home services: DME Criminal Activity/Legal Involvement Pertinent to Current Situation/Hospitalization: No - Comment as needed  Activities of Daily Living Home Assistive Devices/Equipment: Cane (specify quad or  straight) ADL Screening (condition at time of admission) Patient's cognitive ability adequate to safely complete daily activities?: Yes Is the patient deaf or have difficulty hearing?: No Does the patient have difficulty seeing, even when wearing glasses/contacts?: No Does the patient have difficulty concentrating, remembering, or making decisions?: No Patient able to express need for assistance with ADLs?: Yes Does the patient have difficulty dressing or bathing?: No Independently performs ADLs?: Yes (appropriate for developmental age) Does the patient have difficulty walking or climbing stairs?: Yes Weakness of Legs: Both Weakness of Arms/Hands: None  Permission Sought/Granted   Permission granted to share information with : Yes, Verbal Permission Granted  Share Information with NAME: Husband Jennifer Estrada at bedside           Emotional Assessment Appearance:: Appears stated age Attitude/Demeanor/Rapport: Engaged Affect (typically observed): Accepting Orientation: : Oriented to Self, Oriented to Place, Oriented to  Time, Oriented to Situation Alcohol / Substance Use: Not Applicable Psych Involvement: No (comment)  Admission diagnosis:  Acute cholecystitis [K81.0] RUQ pain [R10.11] Cholecystitis [K81.9] Patient Active Problem List   Diagnosis Date Noted  . Cholecystitis 08/19/2019  . Acute cholecystitis 08/18/2019  . OAB (overactive bladder) 02/24/2019  . Vitamin D deficiency   . Allergic rhinitis   . 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 Pseudophakia 02/03/2019  . Closed left hip fracture (HCC) 01/26/2019  . Tremors of nervous system   . GERD (gastroesophageal reflux disease)   . Hyperlipidemia   . Depression   . Acute  blood loss anemia   . Syncope   . Pain in both knees 05/20/2017  . Patella fracture 02/12/2014  . Migraine syndrome 05/02/2012  . Anxiety attack 05/02/2012  . TIA (transient ischemic attack) 05/02/2012   PCP:  Merlene Laughter, MD Pharmacy:   [facility]  [address] ([place]), [place] - [address] [place]) [place] [address] Phone: [phone] Fax: [phone]     Social Determinants of Health (SDOH) Interventions    Readmission Risk Interventions No flowsheet data found.

## 2019-08-20 NOTE — Progress Notes (Signed)
Pt ready to go home. Husband at bedside. Pt does not want HHPT at this time for home.  Pt has ambulated down the hallway with fairly good toleration.  Will review all DC instructions with pt and husband.

## 2019-08-20 NOTE — Evaluation (Signed)
Physical Therapy Evaluation Patient Details Name: Jennifer Estrada MRN: [id number] DOB: 07-Nov-1945 Today's Date: 08/20/2019   History of Present Illness  Pt is a 74 y.o. F with significant PMH of HLD, L femur fracture s/p ORIF (01/2019), osteoporosis who prsents with severe RUQ pain. Admitted with cholecystitis with empyema of gallbladder now s/p laparoscopic cholecystectomy.  Clinical Impression  Prior to admission, pt lives with her spouse, ambulates with walker vs cane, and is independent with ADL's. Pt husband reports she is fairly sedentary. On PT evaluation, pt presents with decreased functional mobility secondary to abdominal pain, weakness, and balance deficits. Requiring min assist for transfers, ambulating 40 feet with a walker at a min guard assist level. Recommended use of walker for all mobility for increased stability/pain control. Pt would benefit from HHPT at discharge to maximize functional independence.     Follow Up Recommendations Home health PT;Supervision for mobility/OOB    Equipment Recommendations  None recommended by PT (well equipped)   Recommendations for Other Services       Precautions / Restrictions Precautions Precautions: Fall Restrictions Weight Bearing Restrictions: No      Mobility  Bed Mobility               General bed mobility comments: OOB in chair  Transfers Overall transfer level: Needs assistance Equipment used: Rolling walker (2 wheeled) Transfers: Sit to/from Stand Sit to Stand: Min assist         General transfer comment: MinA to rise to stand from recliner  Ambulation/Gait Ambulation/Gait assistance: Min guard Gait Distance (Feet): 40 Feet Assistive device: Rolling walker (2 wheeled) Gait Pattern/deviations: Step-through pattern;Decreased stride length;Trunk flexed Gait velocity: decreased   General Gait Details: Cues for upright posture, walker proximity, min guard for safety  Stairs            Wheelchair  Mobility    Modified Rankin (Stroke Patients Only)       Balance Overall balance assessment: Needs assistance Sitting-balance support: Feet supported Sitting balance-Leahy Scale: Good     Standing balance support: Bilateral upper extremity supported Standing balance-Leahy Scale: Poor Standing balance comment: reliant on external support                             Pertinent Vitals/Pain Pain Assessment: Faces Faces Pain Scale: Hurts little more Pain Location: abdomen Pain Descriptors / Indicators: Sore Pain Intervention(s): Monitored during session;Limited activity within patient's tolerance    Home Living Family/patient expects to be discharged to:: Private residence Living Arrangements: Spouse/significant other Available Help at Discharge: Family Type of Home: House Home Access: Stairs to enter Entrance Stairs-Rails: Doctor, general practice of Steps: 3 Home Layout: One level Home Equipment: Environmental consultant - 2 wheels;Walker - 4 wheels;Bedside commode;Cane - single point      Prior Function Level of Independence: Needs assistance   Gait / Transfers Assistance Needed: using "walls," inside house, walker outside house  ADL's / Homemaking Assistance Needed: independent ADL's        Hand Dominance        Extremity/Trunk Assessment   Upper Extremity Assessment Upper Extremity Assessment: Generalized weakness    Lower Extremity Assessment Lower Extremity Assessment: RLE deficits/detail;LLE deficits/detail RLE Deficits / Details: Grossly 4/5 LLE Deficits / Details: Grossly 4/5    Cervical / Trunk Assessment Cervical / Trunk Assessment: Kyphotic  Communication   Communication: No difficulties  Cognition Arousal/Alertness: Awake/alert Behavior During Therapy: WFL for tasks assessed/performed Overall Cognitive Status: Impaired/Different  from baseline Area of Impairment: Following commands;Problem solving                        Following Commands: Follows one step commands with increased time     Problem Solving: Slow processing;Difficulty sequencing;Requires verbal cues General Comments: A&Ox4, slower processing noted      General Comments      Exercises     Assessment/Plan    PT Assessment Patient needs continued PT services  PT Problem List Decreased strength;Decreased activity tolerance;Decreased balance;Decreased mobility;Pain       PT Treatment Interventions DME instruction;Gait training;Stair training;Functional mobility training;Therapeutic activities;Therapeutic exercise;Balance training;Patient/family education    PT Goals (Current goals can be found in the Care Plan section)  Acute Rehab PT Goals Patient Stated Goal: be more mobile PT Goal Formulation: With patient Time For Goal Achievement: 09/03/19 Potential to Achieve Goals: Good    Frequency Min 3X/week   Barriers to discharge        Co-evaluation               AM-PAC PT "6 Clicks" Mobility  Outcome Measure Help needed turning from your back to your side while in a flat bed without using bedrails?: None Help needed moving from lying on your back to sitting on the side of a flat bed without using bedrails?: A Little Help needed moving to and from a bed to a chair (including a wheelchair)?: A Little Help needed standing up from a chair using your arms (e.g., wheelchair or bedside chair)?: A Little Help needed to walk in hospital room?: A Little Help needed climbing 3-5 steps with a railing? : A Little 6 Click Score: 19    End of Session Equipment Utilized During Treatment: Gait belt Activity Tolerance: Patient tolerated treatment well Patient left: in chair;with call bell/phone within reach;with family/visitor present   PT Visit Diagnosis: Pain;Difficulty in walking, not elsewhere classified (R26.2);History of falling (Z91.81);Muscle weakness (generalized) (M62.81) Pain - part of body:  (abdomen)    Time:  2423-5361 PT Time Calculation (min) (ACUTE ONLY): 20 min   Charges:   PT Evaluation $PT Eval Moderate Complexity: 1 Mod            Lillia Pauls, PT, DPT Acute Rehabilitation Services Pager [phone] Office [phone]   Norval Morton 08/20/2019, 12:43 PM

## 2019-08-20 NOTE — Progress Notes (Signed)
Pt assisted to the [facility] this am with assistance. She has had a hip fracture in the past and uses a BSC at home by her bed. She also uses a cane, walker and wheelchair at home. Husband is at bedside and states he helped her after she went to rehab at their home (post hip).  [place], [place] for some tylenol to see if it would assist with her pain management. She has multiple allergies.

## 2019-08-23 LAB — CULTURE, BLOOD (ROUTINE X 2)
Culture: NO GROWTH
Culture: NO GROWTH
Special Requests: ADEQUATE
Special Requests: ADEQUATE

## 2019-08-26 NOTE — Discharge Summary (Signed)
[facility] Discharge Summary   Patient ID: Jennifer Estrada MRN: [id number] DOB/AGE: 75-15-1947 75 y.o.  Admit date: 08/18/2019 Discharge date: 08/20/2019  Discharge Diagnosis Patient Active Problem List   Diagnosis Date Noted  . Cholecystitis 08/19/2019  . Acute cholecystitis 08/18/2019  . OAB (overactive bladder) 02/24/2019  . Vitamin D deficiency   . Allergic rhinitis   . 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 Pseudophakia 02/03/2019  . Closed left hip fracture (HCC) 01/26/2019  . Tremors of nervous system   . GERD (gastroesophageal reflux disease)   . Hyperlipidemia   . Depression   . Acute blood loss anemia   . Syncope   . Pain in both knees 05/20/2017  . Patella fracture 02/12/2014  . Migraine syndrome 05/02/2012  . Anxiety attack 05/02/2012  . TIA (transient ischemic attack) 05/02/2012    Imaging: RUQ U/S: Biliary sludge with a thickened gallbladder wall and pericholecystic fluid. Sonographic Eulah Pont sign is absent however findings remain compatible with an acute cholecystitis in the appropriate clinical setting.  Procedures Dr. Violeta Gelinas 08/19/19 - Laparoscopic Cholecystectomy with [facility]  Hospital Course:  74 y/o F who presented to [facility] with a cc RUW pain associated with nausea and vomiting.  Workup showed acute cholecystitis.  Patient was admitted and underwent procedure listed above. Intraoperatively noted to have cholecystitis with empyema of the gallbladder Tolerated procedure well and was transferred to the floor.  Diet was advanced as tolerated.  On POD#2, the patient was voiding well, tolerating diet, ambulating well, pain well controlled, vital signs stable, incisions c/d/i and felt stable for discharge home.  Patient will follow up in our office in 2 weeks and knows to call with questions or concerns.   Allergies as of 08/20/2019      Reactions   Atorvastatin Other (See Comments)   Feels anxous   Cortisone Other (See Comments)   headache    Oxycodone Other (See Comments)   confusion   Paroxetine Other (See Comments)   anxiety   Penicillins Other (See Comments)   Unknown per pt   Pravastatin Other (See Comments)   muscle pain    Singulair [montelukast] Other (See Comments)   Chest pain   Sulfa Antibiotics Nausea Only   Aleve [naproxen Sodium] Rash   Augmentin [amoxicillin-pot Clavulanate] Rash   Prednisone Other (See Comments)   headaches      Medication List    TAKE these medications   acetaminophen 500 MG tablet Commonly known as: TYLENOL Take 1-2 tablets (500-1,000 mg total) by mouth every 6 (six) hours as needed. What changed:   how much to take  reasons to take this   Dexilant 60 MG capsule Generic drug: dexlansoprazole Take 1 capsule (60 mg total) by mouth daily.   DSS 100 MG Caps Take 100 mg by mouth 2 (two) times daily.   fexofenadine 30 MG tablet Commonly known as: ALLEGRA Take 30 mg by mouth as needed (allergies).   lidocaine 5 % Commonly known as: LIDODERM Place 1 patch onto the skin daily. Remove & Discard patch within 12 hours or as directed by MD   Myrbetriq 50 MG Tb24 tablet Generic drug: mirabegron ER Take 1 tablet (50 mg total) by mouth at bedtime.   PARoxetine 20 MG tablet Commonly known as: PAXIL Take 1 tablet (20 mg total) by mouth every morning. What changed: when to take this   prednisoLONE acetate 1 % ophthalmic suspension Commonly known as: PRED FORTE Place 1 drop into the left eye daily.  propranolol ER 60 MG 24 hr capsule Commonly known as: INDERAL LA Take 1 capsule (60 mg total) by mouth daily.   rosuvastatin 5 MG tablet Commonly known as: CRESTOR Take 1 tablet (5 mg total) by mouth at bedtime. What changed:   when to take this  additional instructions   sucralfate 1 g tablet Commonly known as: Carafate Take 1 tablet (1 g total) by mouth 4 (four) times daily -  with meals and at bedtime.   traMADol 50 MG tablet Commonly known as: ULTRAM Take 1 tablet  (50 mg total) by mouth every 6 (six) hours as needed (mild pain).   Vitamin D (Ergocalciferol) 1.25 MG (50000 UNIT) Caps capsule Commonly known as: DRISDOL Take 1 capsule (50,000 Units total) by mouth every 7 (seven) days. What changed: when to take this         Follow-up Information    [facility] Follow up.   Specialty: General Surgery Why: our office is scheduling you for post-operative follow up. please call to confirm appointment date/time. Contact information: [address] [place] [address] [phone]              Signed: Hosie Spangle, [facility] 08/26/2019, 3:03 PM

## 2019-12-29 ENCOUNTER — Other Ambulatory Visit (INDEPENDENT_AMBULATORY_CARE_PROVIDER_SITE_OTHER): Payer: Self-pay | Admitting: Ophthalmology

## 2019-12-29 DIAGNOSIS — H44002 Unspecified purulent endophthalmitis, left eye: Secondary | ICD-10-CM

## 2020-02-14 NOTE — Progress Notes (Signed)
.  [facility] - Clinic Note  02/18/2020     CHIEF COMPLAINT Patient presents for Retina Follow Up s/p phaco/PCIOL OS, 1.7.19, rg S/p PPV/AC washout/intravitreal vanc, ceftaz, cefepime OS, 1.11.19, bz  HISTORY OF PRESENT ILLNESS: Jennifer Estrada is a 75 y.o. female who presents to the clinic today for:   HPI    Retina Follow Up    Patient presents with  Other (Endophthalmitis ).  In left eye.  Severity is moderate.  Duration of 9 months.  Since onset it is stable.  I, the attending physician,  performed the HPI with the patient and updated documentation appropriately.          Comments    Patient states had CE with IOL OD with Dr. Ezzie Dural improved OD since CE. Vision seems about the same OS. Still sees halos around car lights. On Pred Forte at bedtime OS only.        Last edited by [name], MD on 02/18/2020  1:13 PM. (History)    pt states back in December (12.15.20) she went to the surgery center for cataract sx with Dr. Elmer Picker and she fell while in the bathroom, she states she hit the trash can and the sink with her head on the left side, pt states she broke/crushed her hip and now has a rod in her hip, pt states she was originally scheduled with Dr. Alben Spittle for sx, but did not feel comfortable with him so she switched to Dr. Elmer Picker, pt states she spent Christmas and New Years in the hospital, she states she was in rehab until the end of January, she states she just started walking again (pt has a walker), pt has no follow up appt with Dr. Elmer Picker, pt is currently only using PF Qdaily  Referring physician: Merlene Laughter, MD [address] [place],  [place] [address]  HISTORICAL INFORMATION:   Selected notes from the MEDICAL RECORD NUMBER Referred by Dr. Hortense Ramal for concern of endophthalmitis OS  LEE- 01.11.19  Ocular Hx- CE/toric IOL OS (01.07.19 - R. Groat) - VA 1 day PO OS 20/30, VA OS (01.11.19) HM PMH-     CURRENT  MEDICATIONS: Current Outpatient Medications (Ophthalmic Drugs)  Medication Sig  . prednisoLONE acetate (PRED FORTE) 1 % ophthalmic suspension INSTILL 1 DROP INTO LEFT EYE TWICE DAILY (Patient taking differently: Place 1 drop into the left eye daily.)   No current facility-administered medications for this visit. (Ophthalmic Drugs)   Current Outpatient Medications (Other)  Medication Sig  . acetaminophen (TYLENOL) 500 MG tablet Take 1-2 tablets (500-1,000 mg total) by mouth every 6 (six) hours as needed.  Marland Kitchen dexlansoprazole (DEXILANT) 60 MG capsule Take 1 capsule (60 mg total) by mouth daily.  Marland Kitchen docusate sodium 100 MG CAPS Take 100 mg by mouth 2 (two) times daily.  . fexofenadine (ALLEGRA) 30 MG tablet Take 30 mg by mouth as needed (allergies).   Marland Kitchen lidocaine (LIDODERM) 5 % Place 1 patch onto the skin daily. Remove & Discard patch within 12 hours or as directed by MD  . MYRBETRIQ 50 MG TB24 tablet Take 1 tablet (50 mg total) by mouth at bedtime.  Marland Kitchen PARoxetine (PAXIL) 20 MG tablet Take 1 tablet (20 mg total) by mouth every morning. (Patient taking differently: Take 20 mg by mouth daily.)  . propranolol ER (INDERAL LA) 60 MG 24 hr capsule Take 1 capsule (60 mg total) by mouth daily.  . rosuvastatin (CRESTOR) 5 MG  tablet Take 1 tablet (5 mg total) by mouth at bedtime. (Patient taking differently: Take 5 mg by mouth 3 (three) times a week. Mon, Wed, Fri)  . sucralfate (CARAFATE) 1 g tablet Take 1 tablet (1 g total) by mouth 4 (four) times daily -  with meals and at bedtime.  . traMADol (ULTRAM) 50 MG tablet Take 1 tablet (50 mg total) by mouth every 6 (six) hours as needed (mild pain).  . Vitamin D, Ergocalciferol, (DRISDOL) 1.25 MG (50000 UNIT) CAPS capsule Take 1 capsule (50,000 Units total) by mouth every 7 (seven) days. (Patient taking differently: Take 50,000 Units by mouth every 14 (fourteen) days.)   No current facility-administered medications for this visit. (Other)      REVIEW OF  SYSTEMS: ROS    Positive for: Eyes   Negative for: Constitutional, Gastrointestinal, Neurological, Skin, Genitourinary, Musculoskeletal, HENT, Endocrine, Cardiovascular, Respiratory, Psychiatric, Allergic/Imm, Heme/Lymph   Last edited by Annalee Genta D, COT on 02/18/2020 12:45 PM. (History)       ALLERGIES Allergies  Allergen Reactions  . Atorvastatin Other (See Comments)    Feels anxous  . Cortisone Other (See Comments)    headache  . Oxycodone Other (See Comments)    confusion  . Paroxetine Other (See Comments)    anxiety  . Penicillins Other (See Comments)    Unknown per pt  . Pravastatin Other (See Comments)    muscle pain   . Singulair [Montelukast] Other (See Comments)    Chest pain  . Sulfa Antibiotics Nausea Only  . Aleve [Naproxen Sodium] Rash  . Augmentin [Amoxicillin-Pot Clavulanate] Rash  . Prednisone Other (See Comments)    headaches    PAST MEDICAL HISTORY Past Medical History:  Diagnosis Date  . Allergic rhinitis   . Chronic kidney disease   . Depression   . DJD (degenerative joint disease) of knee   . GERD (gastroesophageal reflux disease)   . Hyperlipidemia   . Migraine    Takes Propranolol  . Neuromuscular disorder (HCC)    Body tremors at times,h/o falling cast right arm  . Osteoporosis   . Tremors of nervous system   . Vitamin D deficiency    Past Surgical History:  Procedure Laterality Date  . cataract surgery Left   . CHOLECYSTECTOMY  08/19/2019  . CHOLECYSTECTOMY N/A 08/19/2019   Procedure: LAPAROSCOPIC CHOLECYSTECTOMY;  Surgeon: Violeta Gelinas, MD;  Location: [facility] OR;  Service: General;  Laterality: N/A;  . INTRAMEDULLARY (IM) NAIL INTERTROCHANTERIC Left 01/27/2019   Procedure: INTRAMEDULLARY (IM) NAIL INTERTROCHANTRIC;  Surgeon: Samson Frederic, MD;  Location: [facility] ORS;  Service: Orthopedics;  Laterality: Left;  . KNEE SURGERY    . Left Foot Surgery  20 years ago   20 years ago  . ORIF PATELLA Left 02/12/2014   Procedure: OPEN REDUCTION  INTERNAL (ORIF) FIXATION LEFT PATELLA;  Surgeon: [name], MD;  Location: [facility] ORS;  Service: Orthopedics;  Laterality: Left;  . PARS PLANA VITRECTOMY Left 02/21/2017   Procedure: PARS PLANA VITRECTOMY 25 GAUGE FOR ENDOPHTHALMITIS;  Surgeon: [name], MD;  Location: [facility] OR;  Service: Ophthalmology;  Laterality: Left;  . WRIST FRACTURE SURGERY      FAMILY HISTORY Family History  Problem Relation Age of Onset  . Alzheimer's disease Mother   . Osteoporosis Father   . GER disease Father   . CVA Father   . Breast cancer Neg Hx     SOCIAL HISTORY Social History   Tobacco Use  . Smoking status: Never Smoker  .  Smokeless tobacco: Never Used  Vaping Use  . Vaping Use: Never used  Substance Use Topics  . Alcohol use: No  . Drug use: No         OPHTHALMIC EXAM:  Base Eye Exam    Visual Acuity (Snellen - Linear)      Right Left   Dist cc 20/25 -2 20/30 -2   Dist ph cc NI 20/25 -2   Correction: Glasses       Tonometry (Tonopen, 1:09 PM)      Right Left   Pressure 11 15       Pupils      Dark Light Shape React APD   Right 3 2 Round Brisk None   Left 6 6 Round Minimal None       Visual Fields (Counting fingers)      Left Right    Full Full       Extraocular Movement      Right Left    Full, Ortho Full, Ortho       Neuro/Psych    Oriented x3: Yes   Mood/Affect: Normal       Dilation    Both eyes: 1.0% Mydriacyl, 2.5% Phenylephrine @ 1:09 PM        Slit Lamp and Fundus Exam    Slit Lamp Exam      Right Left   Lids/Lashes Normal Normal   Conjunctiva/Sclera White and quiet White and quiet   Cornea Arcus, 1+ inferior Punctate epithelial erosions, Debris in tear film, decreased TBUT, well healed cataract wounds Arcus, trace Edema, trace descemet folds; nylon suture temporally; wounds; 2+ PEE  - greatest inferiorly, dry tear film, irregular surface inferiorly   Anterior Chamber Deep and quiet Deep and quiet   Iris Round and dilated Irregular pupil with  temporal iridectomy/iridodialysis   Lens PCIOL in good position PCIOL displaced superiorly, trace pigment on IOL   Vitreous Vitreous syneresis, Posterior vitreous detachment Post vitrectomy, vitreous condensations and pigment settled inferiorly       Fundus Exam      Right Left   Disc Tilted disc, mild temporal PPA tilted disc, inferior PPA, mild Pallor   C/D Ratio 0.5 0.5   Macula Flat, blunted foveal reflex, Retinal pigment epithelial mottling, No heme or edema flat; blunted foveal reflex, ERM with mild striae superiorly, Retinal pigment epithelial mottling, No heme    Vessels Vascular attenuation, Tortuous Vascular attenuation, Tortuous   Periphery Attached attached        Refraction    Wearing Rx      Sphere Cylinder Axis Add   Right -1.50 +0.75 063 +2.50   Left +0.25 +0.50 018 +2.50       Manifest Refraction      Sphere Cylinder Axis Dist VA   Right -1.00 +0.75 065 NI   Left -0.25 +0.50 012 NI          IMAGING AND PROCEDURES  OCT, Retina - OU - Both Eyes       Right Eye Quality was good. Central Foveal Thickness: 278. Progression has been stable. Findings include normal foveal contour, no IRF, no SRF.   Left Eye Quality was good. Central Foveal Thickness: 304. Progression has been stable. Findings include no IRF, no SRF, abnormal foveal contour, epiretinal membrane, outer retinal atrophy, macular pucker (Blunted foveal contour, patchy ellipsoid atrophy, attached, nasal ERM with mild macular pucker).   Notes *Images captured and stored on drive  Diagnosis / Impression:  OD: NFP, No  IRF/SRF  OS: Blunted foveal contour, patchy ellipsoid atrophy, nasal ERM with mild macular pucker, attached, no IRF/SRF  Clinical management:  See below  Abbreviations: NFP - Normal foveal profile. CME - cystoid macular edema. PED - pigment epithelial detachment. IRF - intraretinal fluid. SRF - subretinal fluid. EZ - ellipsoid zone. ERM - epiretinal membrane. ORA - outer retinal  atrophy. ORT - outer retinal tubulation. SRHM - subretinal hyper-reflective material                 ASSESSMENT/PLAN:    ICD-10-CM   1. 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 Iridodialysis of left eye  H21.532   3. Pseudophakia of both eyes  Z96.1   4. Epiretinal membrane (ERM) of left eye  H35.372   5. Retinal edema  H35.81 OCT, Retina - OU - Both Eyes    1. Endophthalmitis OS- resolved  - presented on POD4 s/p phaco/PCIOL on Monday, 01.7.19  - VA HM OS w/ 0.70mm hypopyon, fibrin membrane and irregular pupil, and dense vitreous opacities on b-scan ultrasound  - s/p PPV + AC washout + intravitreal vanc, ceftaz, and cefepime, OS, on 01.11.19  - extensive AC and posterior debris / purulent material noted intra op  - gram stain and culture positive for coag negative staph  - today, doing well with stable VA with just tr residual vit debris inferiorly  - BCVA 20/25-2 today             - IOP 15 OS today  - inferior nylon suture removed at slit lamp with betadine and polymixin drops post removal -- no complications (03.05.19)             - cont PF Qdaily OS  - cont AT OU QID  - f/u 1 yr, sooner prn  2. Iridodialysis OS  - sequelae of endophthalmitis and surgery  - pt with significant glare symptoms, but tolerating for now  - monitor  - if symptoms worsen or fail to improve, consider referral to anterior segment surgeon for evaluation  - discussed possibility of corneal tattoo to improve glare symptoms  - pt wishes to think about it further -- will call back if decides to move forward with referral   3. Pseudophakia OU             - s/p phaco/PCIOL OD (Dr. Elmer Picker, 10.19.21)  - s/p phaco/PCIOL OS on Monday, 11.7.19 (Dr. Hortense Ramal)  - OS: toric IOL displaced superiorly, but stable  - do not recommend surgical intervention for OS at this time  4,5 ERM OS  - mild nasal ERM OS with some pucker  - stable from prior  - do not recommend surgical intervention at this time  -  monitor  Ophthalmic Meds Ordered this visit:  No orders of the defined types were placed in this encounter.     Return in about 1 year (around 02/17/2021) for h/o endophthalmitis OS, Dilated Exam, OCT.  There are no Patient Instructions on file for this visit.   This document serves as a record of services personally performed by Karie Chimera, MD, PhD. It was created on their behalf by Herby Abraham, COA, an ophthalmic technician. The creation of this record is the provider's dictation and/or activities during the visit.    Electronically signed by: Herby Abraham, COA @TODAY @ 1:23 AM  Karie Chimera, M.D., Ph.D. Diseases & Surgery of the Retina and Vitreous [facility]  I have reviewed the above  documentation for accuracy and completeness, and I agree with the above. Karie Chimera, M.D., Ph.D. 02/20/20 1:23 AM  Abbreviations: M myopia (nearsighted); A astigmatism; H hyperopia (farsighted); P presbyopia; Mrx spectacle prescription;  CTL contact lenses; OD right eye; OS left eye; OU both eyes  XT exotropia; ET esotropia; PEK punctate epithelial keratitis; PEE punctate epithelial erosions; DES dry eye syndrome; MGD meibomian gland dysfunction; ATs artificial tears; PFAT's preservative free artificial tears; NSC nuclear sclerotic cataract; PSC posterior subcapsular cataract; ERM epi-retinal membrane; PVD posterior vitreous detachment; RD retinal detachment; DM diabetes mellitus; DR diabetic retinopathy; NPDR non-proliferative diabetic retinopathy; PDR proliferative diabetic retinopathy; CSME clinically significant macular edema; DME diabetic macular edema; dbh dot blot hemorrhages; CWS cotton wool spot; POAG primary open angle glaucoma; C/D cup-to-disc ratio; HVF humphrey visual field; GVF goldmann visual field; OCT optical coherence tomography; IOP intraocular pressure; BRVO Branch retinal vein occlusion; CRVO central retinal vein occlusion; CRAO central retinal  artery occlusion; BRAO branch retinal artery occlusion; RT retinal tear; SB scleral buckle; PPV pars plana vitrectomy; VH Vitreous hemorrhage; PRP panretinal laser photocoagulation; IVK intravitreal kenalog; VMT vitreomacular traction; MH Macular hole;  NVD neovascularization of the disc; NVE neovascularization elsewhere; AREDS age related eye disease study; ARMD age related macular degeneration; POAG primary open angle glaucoma; EBMD epithelial/anterior basement membrane dystrophy; ACIOL anterior chamber intraocular lens; IOL intraocular lens; PCIOL posterior chamber intraocular lens; Phaco/IOL phacoemulsification with intraocular lens placement; PRK photorefractive keratectomy; LASIK laser assisted in situ keratomileusis; HTN hypertension; DM diabetes mellitus; COPD chronic obstructive pulmonary disease

## 2020-02-16 ENCOUNTER — Encounter (INDEPENDENT_AMBULATORY_CARE_PROVIDER_SITE_OTHER): Payer: Medicare Other | Admitting: Ophthalmology

## 2020-02-18 ENCOUNTER — Encounter (INDEPENDENT_AMBULATORY_CARE_PROVIDER_SITE_OTHER): Payer: Self-pay | Admitting: Ophthalmology

## 2020-02-18 ENCOUNTER — Other Ambulatory Visit: Payer: Self-pay

## 2020-02-18 ENCOUNTER — Ambulatory Visit (INDEPENDENT_AMBULATORY_CARE_PROVIDER_SITE_OTHER): Payer: Medicare Other | Admitting: Ophthalmology

## 2020-02-18 DIAGNOSIS — H3581 Retinal edema: Secondary | ICD-10-CM

## 2020-02-18 DIAGNOSIS — Z961 Presence of intraocular lens: Secondary | ICD-10-CM | POA: Diagnosis not present

## 2020-02-18 DIAGNOSIS — H21532 Iridodialysis, left eye: Secondary | ICD-10-CM

## 2020-02-18 DIAGNOSIS — H44002 Unspecified purulent endophthalmitis, left eye: Secondary | ICD-10-CM

## 2020-02-18 DIAGNOSIS — H35372 Puckering of macula, left eye: Secondary | ICD-10-CM

## 2020-02-18 DIAGNOSIS — H25811 Combined forms of age-related cataract, right eye: Secondary | ICD-10-CM

## 2020-02-18 DIAGNOSIS — H4312 Vitreous hemorrhage, left eye: Secondary | ICD-10-CM

## 2020-03-19 DIAGNOSIS — M25552 Pain in left hip: Secondary | ICD-10-CM | POA: Insufficient documentation

## 2020-04-24 DIAGNOSIS — M545 Low back pain, unspecified: Secondary | ICD-10-CM | POA: Insufficient documentation

## 2020-11-17 ENCOUNTER — Other Ambulatory Visit (INDEPENDENT_AMBULATORY_CARE_PROVIDER_SITE_OTHER): Payer: Self-pay | Admitting: Ophthalmology

## 2020-11-17 DIAGNOSIS — H44002 Unspecified purulent endophthalmitis, left eye: Secondary | ICD-10-CM

## 2020-11-24 IMAGING — RF DG HIP (WITH PELVIS) OPERATIVE*L*
1 series · 7 of 7 positions shown · non-contrast
Comparison: None.

CLINICAL DATA: Hip fracture repair

FLUOROSCOPY TIME:  1 minutes 57 seconds.
Images: 7
EXAM:
OPERATIVE LEFT HIP (WITH PELVIS IF PERFORMED) 7 VIEWS
TECHNIQUE: Fluoroscopic spot image(s) were submitted for interpretation
post-operatively.

[Series 1: unknown protocol · 0.20mm/px · 7 of 7 slices shown]
[im 1/7]
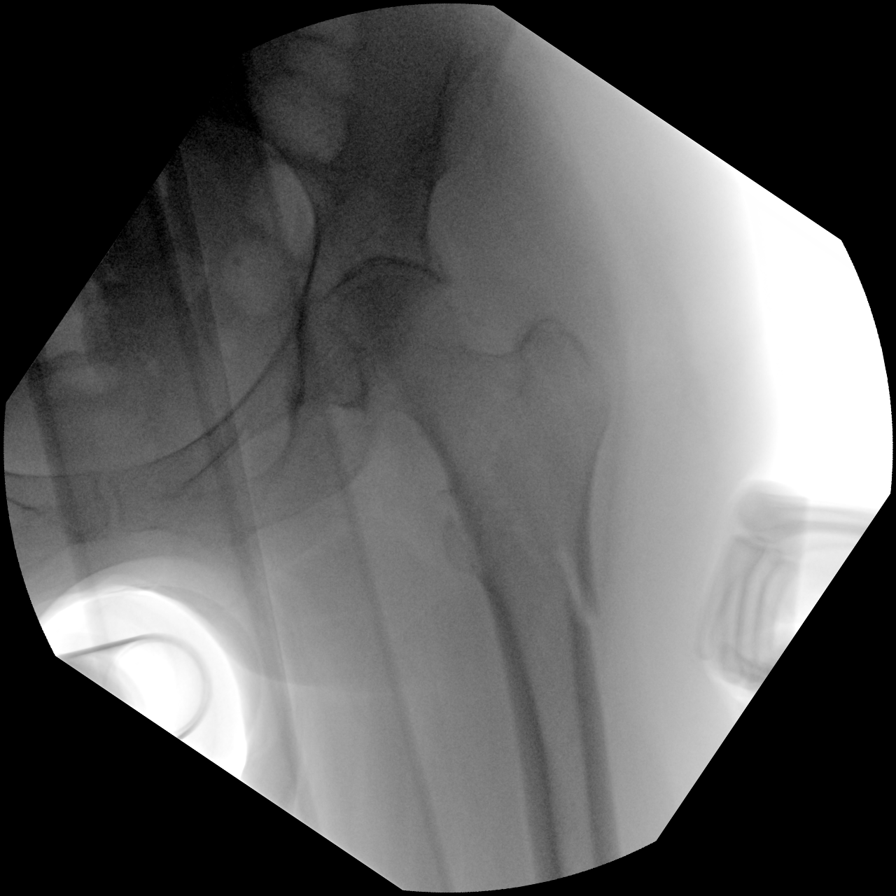
[im 2/7]
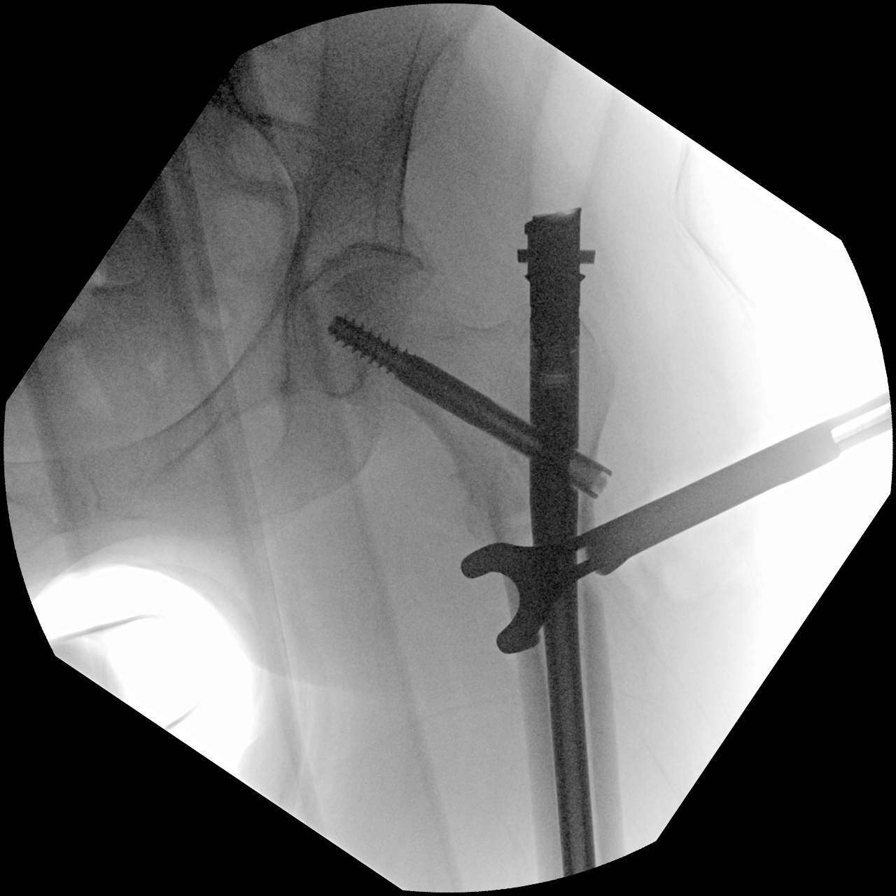
[im 3/7]
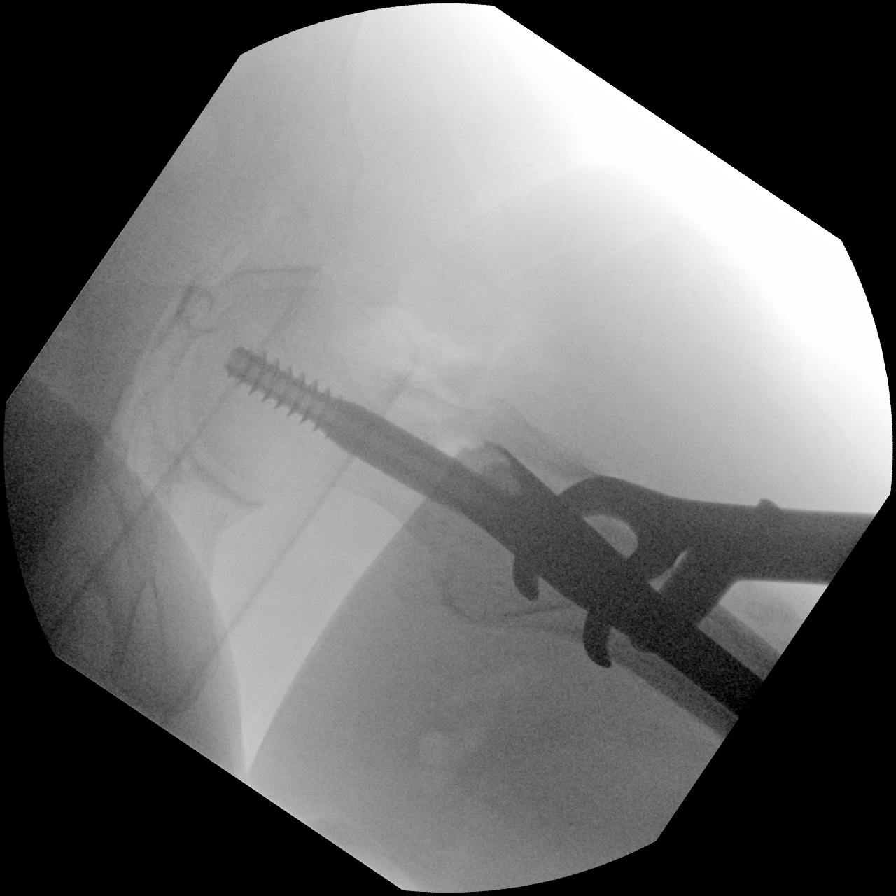
[im 4/7]
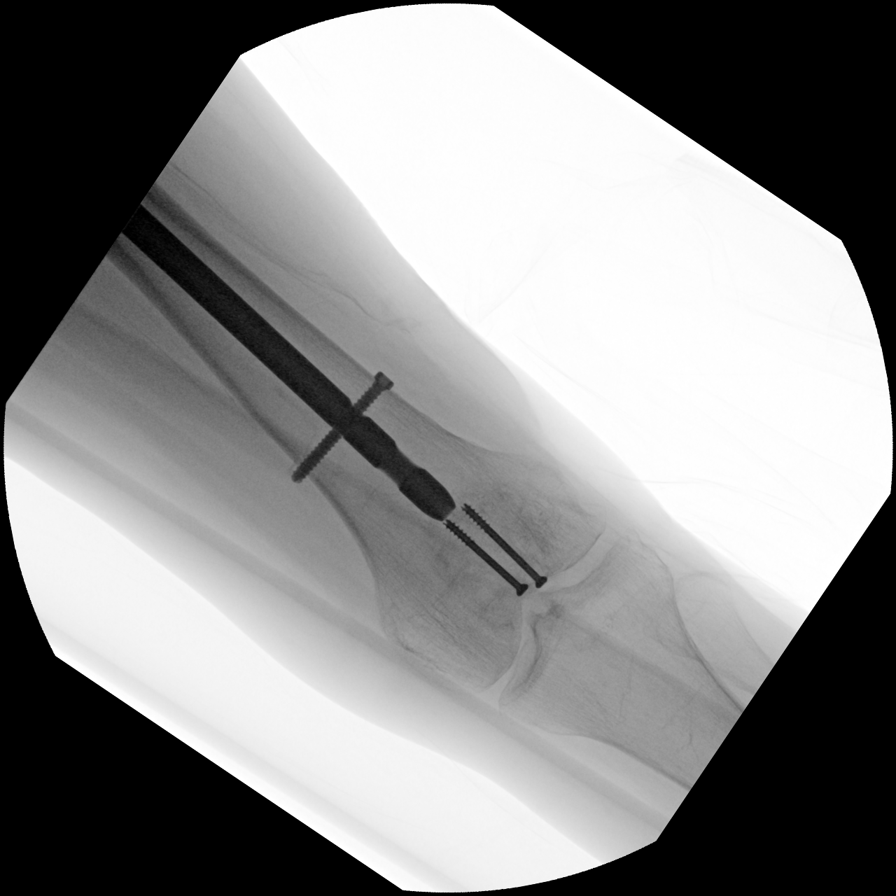
[im 5/7]
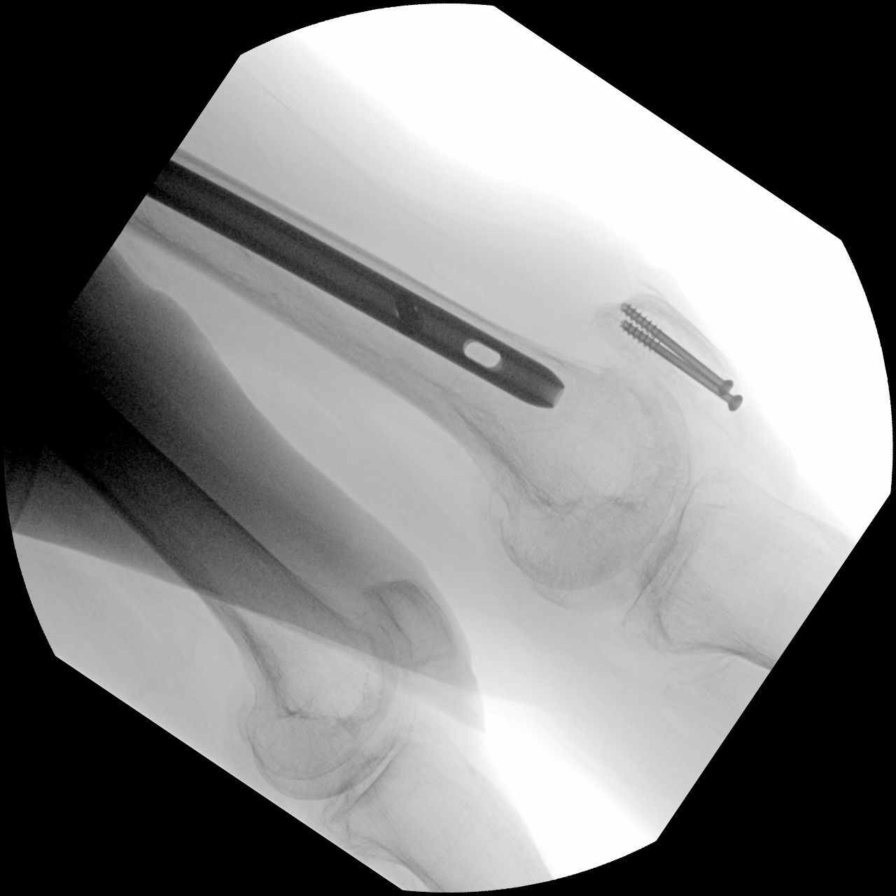
[im 6/7]
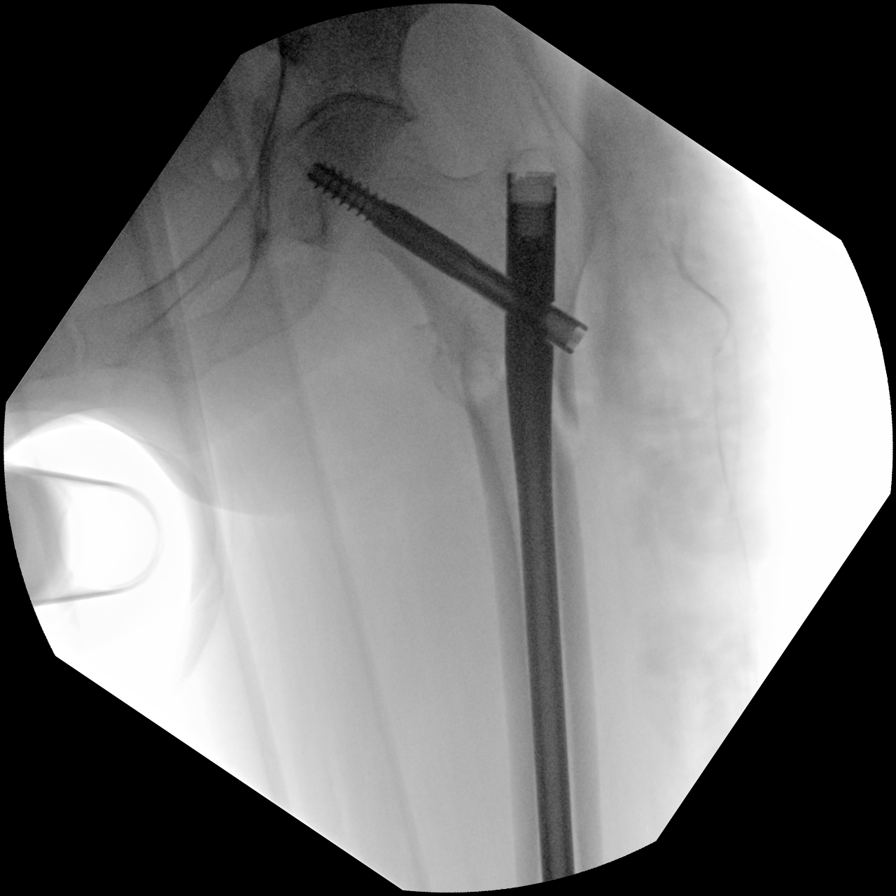
[im 7/7]
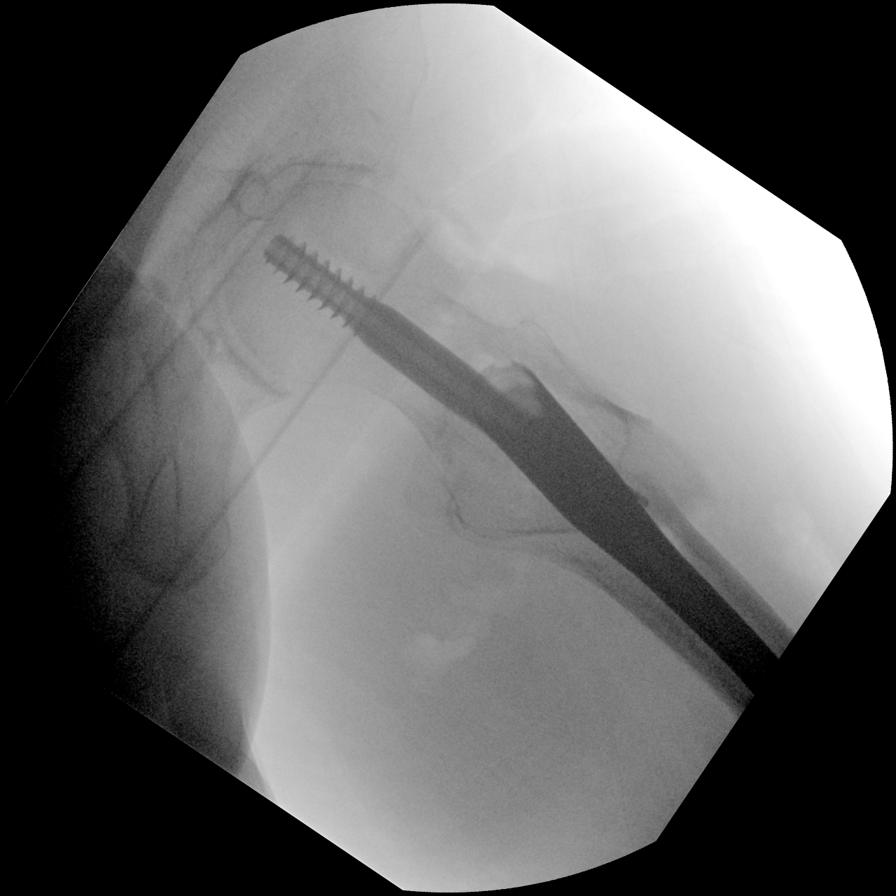

[7 of 7 positions shown; findings below may reference images not displayed]

FINDINGS: The patient's left hip fracture is repaired throughout the study
with a gamma nail and intramedullary femoral rod, affixed distally
with a single interlocking screw. Hardware is in good position.
IMPRESSION: Left hip fracture repair as above.

## 2021-01-20 ENCOUNTER — Other Ambulatory Visit (INDEPENDENT_AMBULATORY_CARE_PROVIDER_SITE_OTHER): Payer: Self-pay | Admitting: Ophthalmology

## 2021-01-20 DIAGNOSIS — H44002 Unspecified purulent endophthalmitis, left eye: Secondary | ICD-10-CM

## 2021-02-16 NOTE — Progress Notes (Signed)
[facility] Note  02/19/2021     CHIEF COMPLAINT Patient presents for Retina Follow Up s/p phaco/PCIOL OS, 1.7.19, rg S/p PPV/AC washout/intravitreal vanc, ceftaz, cefepime OS, 1.11.19, bz  HISTORY OF PRESENT ILLNESS: Jennifer Estrada is a 76 y.o. female who presents to the clinic today for:   HPI     Retina Follow Up   Patient presents with  Other.  In left eye.  Severity is moderate.  Duration of 12 months.  Since onset it is stable.  I, the attending physician,  performed the HPI with the patient and updated documentation appropriately.        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 Pt states vision is doing well. She does report having cataract sx OD in the last year by [name]. Reports procedure went well, vision is good. She is still taking PF 1 gtt OS QD.       Last edited by [name], MD on 02/19/2021  1:01 PM.    Pt states vision is not as good as last year   Referring physician: Lajean Manes, [address] [place],  [place] [address]  HISTORICAL INFORMATION:   Selected notes from the MEDICAL RECORD NUMBER Referred by Dr. [name] for concern of endophthalmitis OS  LEE- 01.11.19  Ocular Hx- CE/toric IOL OS (01.07.19 - R. Groat) - VA 1 day PO OS 20/30, VA OS (01.11.19) HM PMH-     CURRENT MEDICATIONS: Current Outpatient Medications (Ophthalmic Drugs)  Medication Sig   prednisoLONE acetate (PRED FORTE) 1 % ophthalmic suspension Place 1 drop into the left eye daily.   No current facility-administered medications for this visit. (Ophthalmic Drugs)   Current Outpatient Medications (Other)  Medication Sig   lansoprazole (PREVACID) 30 MG capsule Take 30 mg by mouth 2 (two) times daily.   PARoxetine (PAXIL) 20 MG tablet Take 1 tablet (20 mg total) by mouth every morning. (Patient taking differently: Take 20 mg by mouth daily.)   propranolol ER (INDERAL LA) 60 MG 24 hr capsule Take 1 capsule  (60 mg total) by mouth daily.   rosuvastatin (CRESTOR) 5 MG tablet Take 1 tablet (5 mg total) by mouth at bedtime. (Patient taking differently: Take 5 mg by mouth 3 (three) times a week. Mon, Wed, Fri)   Vibegron (GEMTESA) 75 MG TABS Take 75 mg by mouth daily.   Vitamin D, Ergocalciferol, (DRISDOL) 1.25 MG (50000 UNIT) CAPS capsule Take 1 capsule (50,000 Units total) by mouth every 7 (seven) days. (Patient taking differently: Take 50,000 Units by mouth every 14 (fourteen) days.)   acetaminophen (TYLENOL) 500 MG tablet Take 1-2 tablets (500-1,000 mg total) by mouth every 6 (six) hours as needed. (Patient not taking: Reported on 02/19/2021)   dexlansoprazole (DEXILANT) 60 MG capsule Take 1 capsule (60 mg total) by mouth daily. (Patient not taking: Reported on 02/19/2021)   docusate sodium 100 MG CAPS Take 100 mg by mouth 2 (two) times daily. (Patient not taking: Reported on 02/19/2021)   fexofenadine (ALLEGRA) 30 MG tablet Take 30 mg by mouth as needed (allergies).  (Patient not taking: Reported on 02/19/2021)   lidocaine (LIDODERM) 5 % Place 1 patch onto the skin daily. Remove & Discard patch within 12 hours or as directed by MD (Patient not taking: Reported on 02/19/2021)   MYRBETRIQ 50 MG TB24 tablet Take 1 tablet (50 mg total) by mouth at bedtime. (Patient not taking:  Reported on 02/19/2021)   sucralfate (CARAFATE) 1 g tablet Take 1 tablet (1 g total) by mouth 4 (four) times daily -  with meals and at bedtime. (Patient not taking: Reported on 02/19/2021)   traMADol (ULTRAM) 50 MG tablet Take 1 tablet (50 mg total) by mouth every 6 (six) hours as needed (mild pain). (Patient not taking: Reported on 02/19/2021)   No current facility-administered medications for this visit. (Other)   REVIEW OF SYSTEMS: ROS   Positive for: Eyes Negative for: Constitutional, Gastrointestinal, Neurological, Skin, Genitourinary, Musculoskeletal, HENT, Endocrine, Cardiovascular, Respiratory, Psychiatric, Allergic/Imm, Heme/Lymph Last  edited by Kingsley Spittle, COT on 02/19/2021  9:33 AM.     ALLERGIES Allergies  Allergen Reactions   Atorvastatin Other (See Comments)    Feels anxous   Cortisone Other (See Comments)    headache   Oxycodone Other (See Comments)    confusion   Paroxetine Other (See Comments)    anxiety   Penicillins Other (See Comments)    Unknown per pt   Pravastatin Other (See Comments)    muscle pain    Singulair [Montelukast] Other (See Comments)    Chest pain   Sulfa Antibiotics Nausea Only   Aleve [Naproxen Sodium] Rash   Augmentin [Amoxicillin-Pot Clavulanate] Rash   Prednisone Other (See Comments)    headaches   PAST MEDICAL HISTORY Past Medical History:  Diagnosis Date   Allergic rhinitis    Chronic kidney disease    Depression    DJD (degenerative joint disease) of knee    GERD (gastroesophageal reflux disease)    Hyperlipidemia    Migraine    Takes Propranolol   Neuromuscular disorder ([place])    Body tremors at times,h/o falling cast right arm   Osteoporosis    Tremors of nervous system    Vitamin D deficiency    Past Surgical History:  Procedure Laterality Date   cataract surgery Left    CHOLECYSTECTOMY  08/19/2019   CHOLECYSTECTOMY N/A 08/19/2019   Procedure: LAPAROSCOPIC CHOLECYSTECTOMY;  Surgeon: Georganna Skeans, MD;  Location: [facility];  Service: General;  Laterality: N/A;   INTRAMEDULLARY (IM) NAIL INTERTROCHANTERIC Left 01/27/2019   Procedure: INTRAMEDULLARY (IM) NAIL INTERTROCHANTRIC;  Surgeon: Rod Can, MD;  Location: [facility];  Service: Orthopedics;  Laterality: Left;   KNEE SURGERY     Left Foot Surgery  20 years ago   20 years ago   ORIF PATELLA Left 02/12/2014   Procedure: OPEN REDUCTION INTERNAL (ORIF) FIXATION LEFT PATELLA;  Surgeon: Mauri Pole, MD;  Location: [facility];  Service: Orthopedics;  Laterality: Left;   PARS PLANA VITRECTOMY Left 02/21/2017   Procedure: PARS PLANA VITRECTOMY 25 GAUGE FOR ENDOPHTHALMITIS;  Surgeon: [name], MD;   Location: [facility];  Service: Ophthalmology;  Laterality: Left;   WRIST FRACTURE SURGERY     FAMILY HISTORY Family History  Problem Relation Age of Onset   Alzheimer's disease Mother    Osteoporosis Father    GER disease Father    CVA Father    Breast cancer Neg Hx    SOCIAL HISTORY Social History   Tobacco Use   Smoking status: Never   Smokeless tobacco: Never  Vaping Use   Vaping Use: Never used  Substance Use Topics   Alcohol use: No   Drug use: No       OPHTHALMIC EXAM:  Base Eye Exam     Visual Acuity (Snellen - Linear)       Right Left   Dist cc 20/25 -2  20/30 +1   Dist ph cc NI NI    Correction: Glasses         Tonometry (Tonopen, 9:52 AM)       Right Left   Pressure 11 15         Pupils       Dark Light Shape React APD   Right 3 2 Round Brisk None   Left 6 6 Irregular None None         Visual Fields (Counting fingers)       Left Right    Full Full         Extraocular Movement       Right Left    Full, Ortho Full, Ortho         Neuro/Psych     Oriented x3: Yes   Mood/Affect: Normal         Dilation     Both eyes: 1.0% Mydriacyl, 2.5% Phenylephrine @ 9:53 AM           Slit Lamp and Fundus Exam     Slit Lamp Exam       Right Left   Lids/Lashes Dermatochalasis - upper lid, mild MGD Dermatochalasis - upper lid, mild MGD   Conjunctiva/Sclera White and quiet White and quiet   Cornea Arcus, 1-2+ inferior Punctate epithelial erosions, Debris in tear film, well healed cataract wounds, mild tear film debris Arcus, trace Edema, trace descemet folds; nylon suture temporally, 2+ PEE, dry tear film, irregular surface inferiorly, decreased TBUT, well healed cataract wound   Anterior Chamber Deep and quiet Deep and quiet   Iris Round and dilated Irregular pupil with temporal iridectomy/iridodialysis   Lens PCIOL in good position PCIOL displaced superiorly, trace pigment on IOL   Anterior Vitreous Vitreous syneresis, Posterior  vitreous detachment Post vitrectomy, trace vitreous condensations settled inferiorly         Fundus Exam       Right Left   Disc Tilted disc, mild temporal PPA tilted disc, inferior PPA, mild Pallor   C/D Ratio 0.5 0.5   Macula Flat, blunted foveal reflex, Retinal pigment epithelial mottling, No heme or edema flat; blunted foveal reflex, ERM with mild striae superiorly, Retinal pigment epithelial mottling, No heme   Vessels Vascular attenuation, mild tortuousity Vascular attenuation, Tortuous   Periphery Attached attached           Refraction     Wearing Rx       Sphere Cylinder Axis Add   Right -1.25 +1.00 069 +2.25   Left +0.50 +0.50 013 +2.25           IMAGING AND PROCEDURES  OCT, Retina - OU - Both Eyes       Right Eye Quality was good. Central Foveal Thickness: 277. Progression has been stable. Findings include normal foveal contour, no IRF, no SRF.   Left Eye Quality was good. Central Foveal Thickness: 305. Progression has been stable. Findings include no IRF, no SRF, abnormal foveal contour, epiretinal membrane, outer retinal atrophy, macular pucker (Blunted foveal contour, patchy ellipsoid atrophy, attached, nasal ERM with mild macular pucker).   Notes *Images captured and stored on drive  Diagnosis / Impression:  OD: NFP, No IRF/SRF  OS: Blunted foveal contour, patchy ellipsoid atrophy, nasal ERM with mild macular pucker, attached, no IRF/SRF -- stable  Clinical management:  See below  Abbreviations: NFP - Normal foveal profile. CME - cystoid macular edema. PED - pigment epithelial detachment. IRF - intraretinal fluid. SRF -  subretinal fluid. EZ - ellipsoid zone. ERM - epiretinal membrane. ORA - outer retinal atrophy. ORT - outer retinal tubulation. SRHM - subretinal hyper-reflective material             ASSESSMENT/PLAN:    ICD-10-CM   1. 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 Iridodialysis of left eye  H21.532     3. Pseudophakia of  both eyes  Z96.1     4. Epiretinal membrane (ERM) of left eye  H35.372 OCT, Retina - OU - Both Eyes     1. Endophthalmitis OS- resolved  - presented on POD4 s/p phaco/PCIOL on Monday, 01.7.19  - VA HM OS w/ 0.54m hypopyon, fibrin membrane and irregular pupil, and dense vitreous opacities on b-scan ultrasound  - s/p PPV + AC washout + intravitreal vanc, ceftaz, and cefepime, OS, on 01.11.19  - extensive AC and posterior debris / purulent material noted intra op  - gram stain and culture positive for coag negative staph  - today, doing well with stable VA with just tr residual vit debris inferiorly  - BCVA 20/30 +1 from 20/25-2 today             - IOP 15 OS today             - cont PF Qdaily OS   - cont AT OU QID  - f/u 1 yr, sooner prn  2. Iridodialysis OS  - sequelae of endophthalmitis and surgery  - pt with significant glare symptoms, but tolerating for now  - monitor  - if symptoms worsen or fail to improve, consider referral to anterior segment surgeon for evaluation  - discussed possibility of corneal tattoo to improve glare symptoms  - pt wishes to think about it further--will call back if wishes to proceed with referral   3. Pseudophakia OU             - s/p phaco/PCIOL OD (Dr. HHerbert Deaner 10.19.21)  - s/p phaco/PCIOL OS on Monday, 11.7.19 (Dr. RElliot Dally  - OS: toric IOL displaced superiorly, but stable  - do not recommend surgical intervention at this time  4 ERM OS  - mild nasal ERM OS with some pucker  - stable from prior  - do not recommend surgical intervention at this time  - monitor  Ophthalmic Meds Ordered this visit:  No orders of the defined types were placed in this encounter.     Return in about 1 year (around 02/19/2022) for f/u ERM OS, DFE, OCT.  There are no Patient Instructions on file for this visit.   This document serves as a record of services personally performed by BGardiner Sleeper MD, PhD. It was created on their behalf by DRoselee Nova COMT. The  creation of this record is the provider's dictation and/or activities during the visit.  Electronically signed by: DRoselee Nova COMT 02/19/21 1:04 PM  This document serves as a record of services personally performed by BGardiner Sleeper MD, PhD. It was created on their behalf by ASan Jetty BOwens Shark OA an ophthalmic technician. The creation of this record is the provider's dictation and/or activities during the visit.    Electronically signed by: ASan Jetty BOwens Shark O[place]01.09.2023 1:04 PM  BGardiner Sleeper M.D., Ph.D. Diseases & Surgery of the Retina and Vitreous [employer] I have reviewed the above documentation for accuracy and completeness, and I agree with the above. BGardiner Sleeper M.D., Ph.D. 02/19/21 1:04 PM  Abbreviations: M myopia (nearsighted); A astigmatism; H hyperopia (farsighted); P presbyopia; Mrx spectacle prescription;  CTL contact lenses; OD right eye; OS left eye; OU both eyes  XT exotropia; ET esotropia; PEK punctate epithelial keratitis; PEE punctate epithelial erosions; DES dry eye syndrome; MGD meibomian gland dysfunction; ATs artificial tears; PFAT's preservative free artificial tears; [place] nuclear sclerotic cataract; PSC posterior subcapsular cataract; ERM epi-retinal membrane; PVD posterior vitreous detachment; RD retinal detachment; DM diabetes mellitus; DR diabetic retinopathy; NPDR non-proliferative diabetic retinopathy; PDR proliferative diabetic retinopathy; CSME clinically significant macular edema; DME diabetic macular edema; dbh dot blot hemorrhages; CWS cotton wool spot; POAG primary open angle glaucoma; C/D cup-to-disc ratio; HVF humphrey visual field; GVF goldmann visual field; OCT optical coherence tomography; IOP intraocular pressure; BRVO Branch retinal vein occlusion; CRVO central retinal vein occlusion; CRAO central retinal artery occlusion; BRAO branch retinal artery occlusion; RT retinal tear; SB scleral buckle; PPV pars plana vitrectomy;  VH Vitreous hemorrhage; PRP panretinal laser photocoagulation; IVK intravitreal kenalog; VMT vitreomacular traction; MH Macular hole;  NVD neovascularization of the disc; NVE neovascularization elsewhere; AREDS age related eye disease study; ARMD age related macular degeneration; POAG primary open angle glaucoma; EBMD epithelial/anterior basement membrane dystrophy; ACIOL anterior chamber intraocular lens; IOL intraocular lens; PCIOL posterior chamber intraocular lens; Phaco/IOL phacoemulsification with intraocular lens placement; [place] photorefractive keratectomy; LASIK laser assisted in situ keratomileusis; HTN hypertension; DM diabetes mellitus; COPD chronic obstructive pulmonary disease

## 2021-02-19 ENCOUNTER — Other Ambulatory Visit: Payer: Self-pay

## 2021-02-19 ENCOUNTER — Ambulatory Visit (INDEPENDENT_AMBULATORY_CARE_PROVIDER_SITE_OTHER): Payer: Medicare Other | Admitting: Ophthalmology

## 2021-02-19 ENCOUNTER — Encounter (INDEPENDENT_AMBULATORY_CARE_PROVIDER_SITE_OTHER): Payer: Self-pay | Admitting: Ophthalmology

## 2021-02-19 DIAGNOSIS — H35372 Puckering of macula, left eye: Secondary | ICD-10-CM | POA: Diagnosis not present

## 2021-02-19 DIAGNOSIS — H21532 Iridodialysis, left eye: Secondary | ICD-10-CM | POA: Diagnosis not present

## 2021-02-19 DIAGNOSIS — H44002 Unspecified purulent endophthalmitis, left eye: Secondary | ICD-10-CM

## 2021-02-19 DIAGNOSIS — Z961 Presence of intraocular lens: Secondary | ICD-10-CM

## 2021-05-16 IMAGING — DX DG CHEST 1V PORT
1 series · 1 of 1 positions shown · non-contrast
Comparison: Portable exam 9253 hours compared to 01/26/2019

CLINICAL DATA: Epigastric abdominal pain since this morning, 8
greasy food yesterday, history GERD

EXAM:
PORTABLE CHEST 1 VIEW

[chest ap]
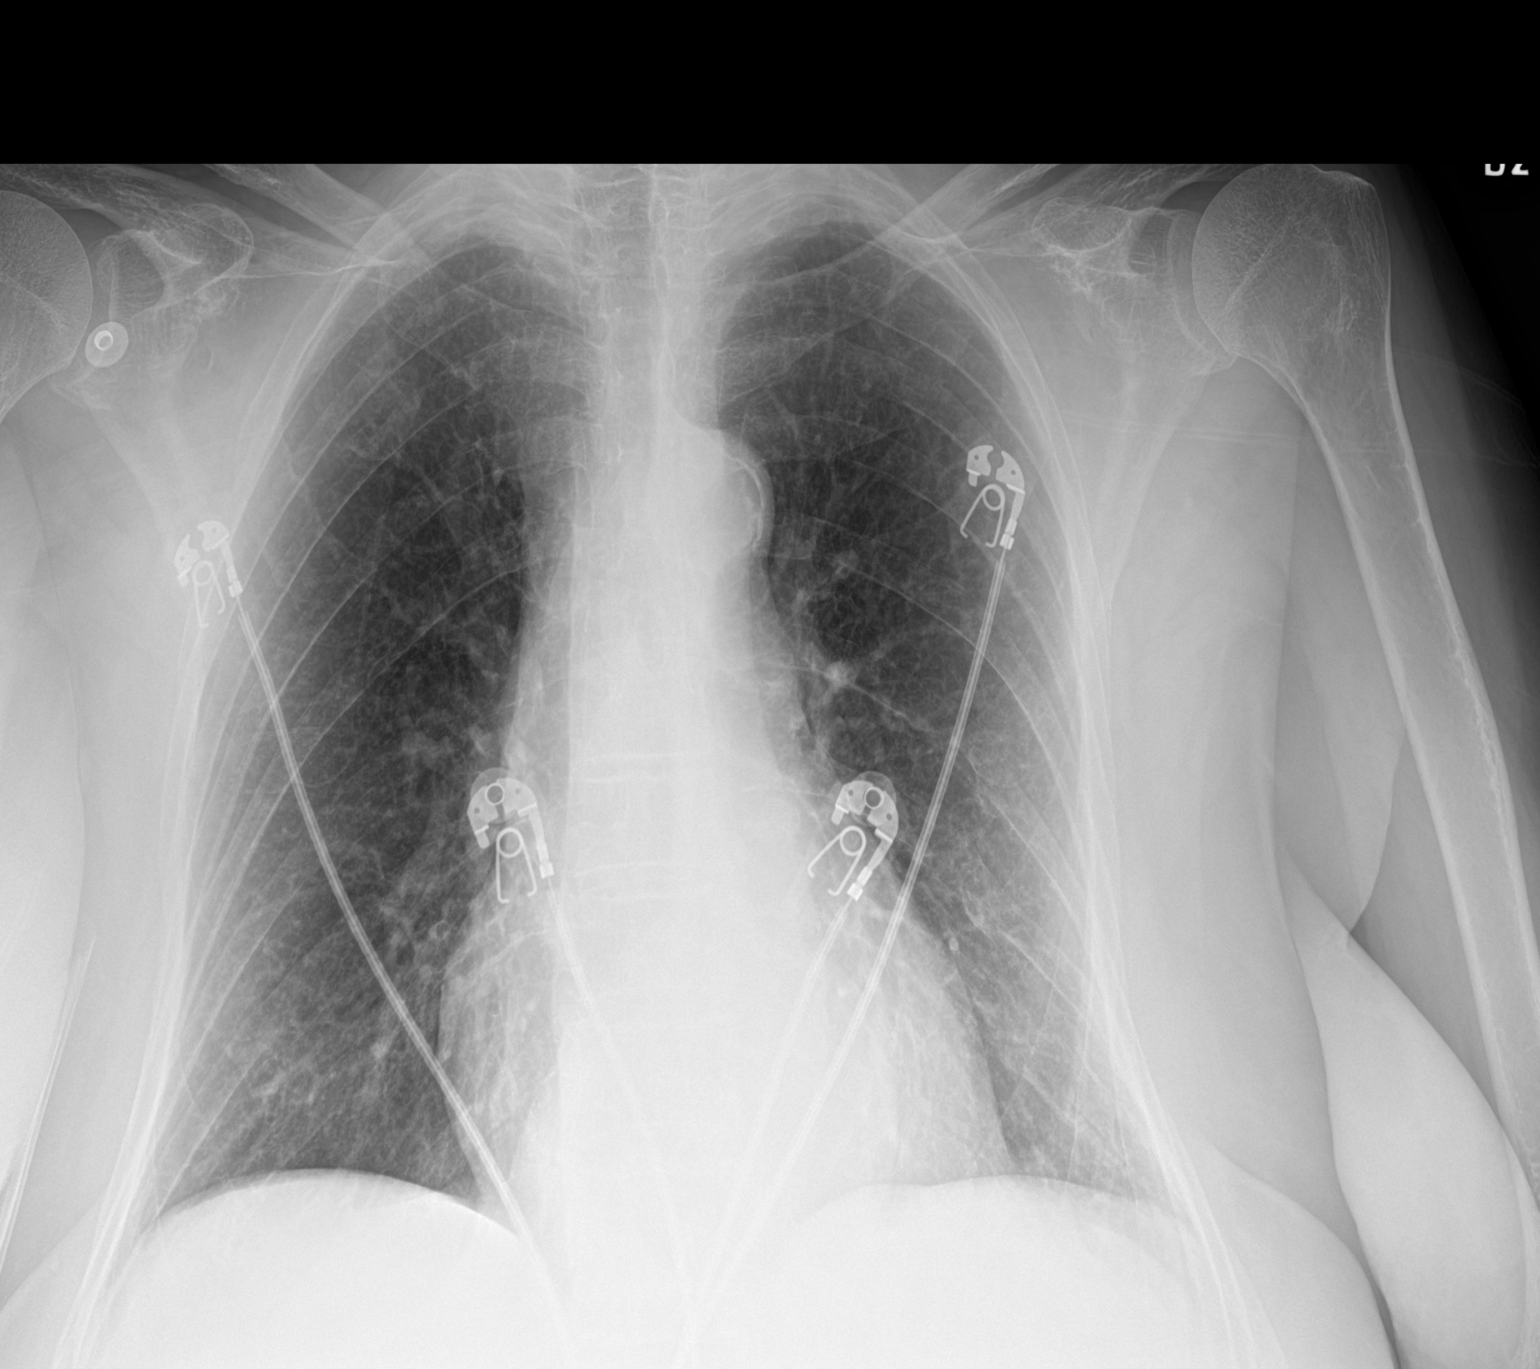

[1 of 1 positions shown; findings below may reference images not displayed]

FINDINGS: Normal heart size, mediastinal contours, and pulmonary vascularity.

Atherosclerotic calcification aorta.

Minimal peribronchial thickening and hyperinflation question COPD.

No pulmonary infiltrate, pleural effusion or pneumothorax.

Bones demineralized.
IMPRESSION: Peribronchial thickening and mild hyperinflation which could
represent COPD or asthma.

No acute infiltrate.

Aortic Atherosclerosis ([id number]).

## 2021-06-15 IMAGING — US US ABDOMEN LIMITED
1 series · 14 of 25 positions shown · non-contrast
Comparison: CT 08/18/2019

CLINICAL DATA: Right upper quadrant pain for 2 weeks

EXAM:
ULTRASOUND ABDOMEN LIMITED RIGHT UPPER QUADRANT

[Series 1: us abdomen limited ruq · 14 of 55 slices shown]
[im 1/55]
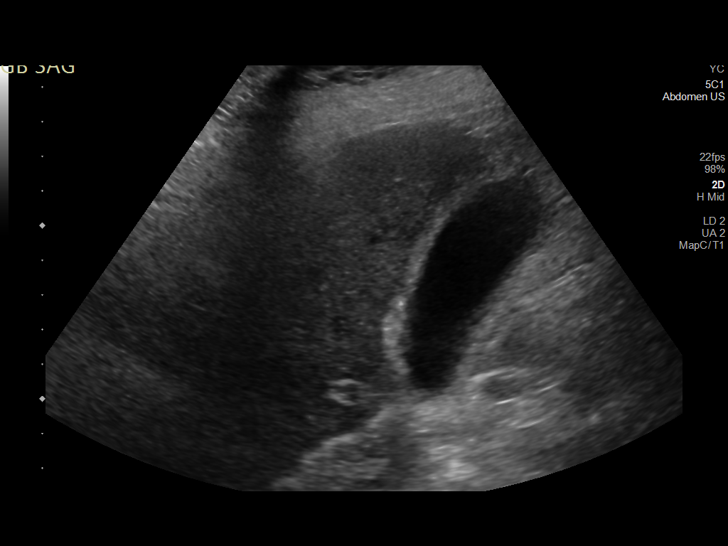
[im 5/55]
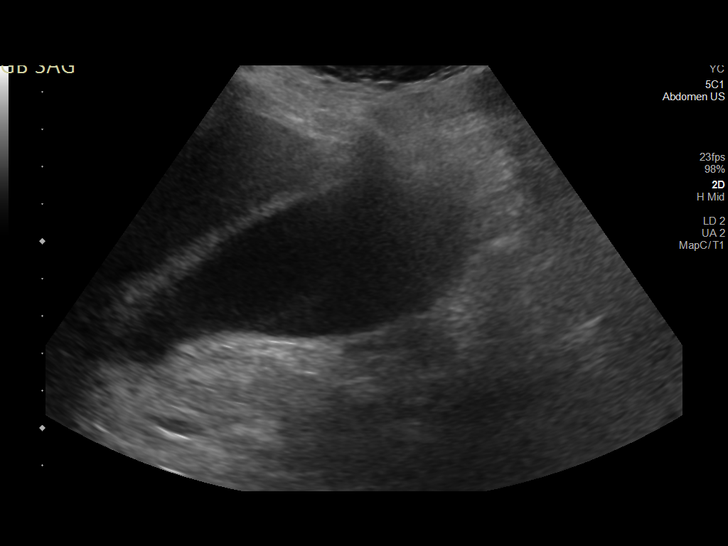
[im 10/55]
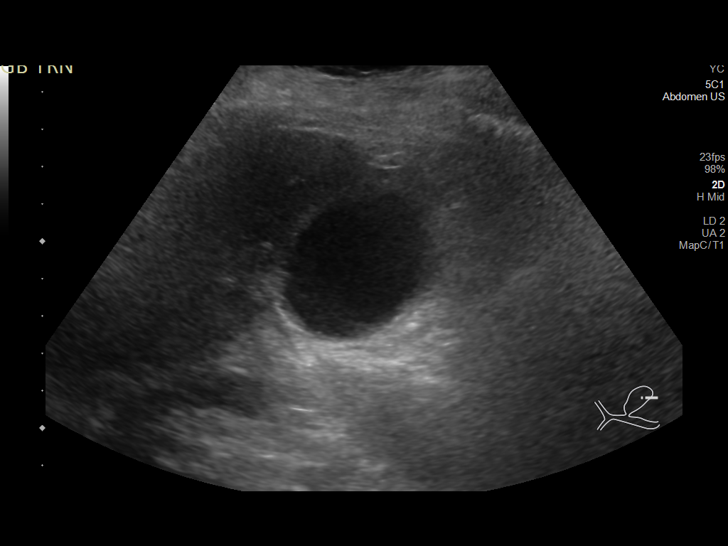
[im 14/55]
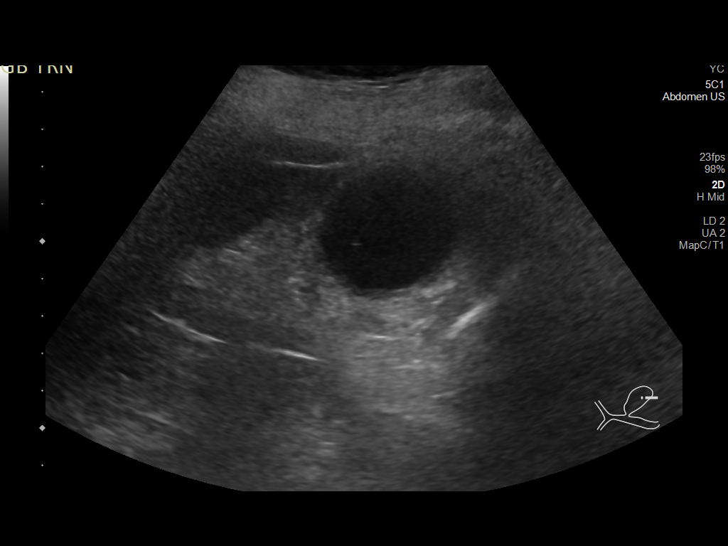
[im 19/55]
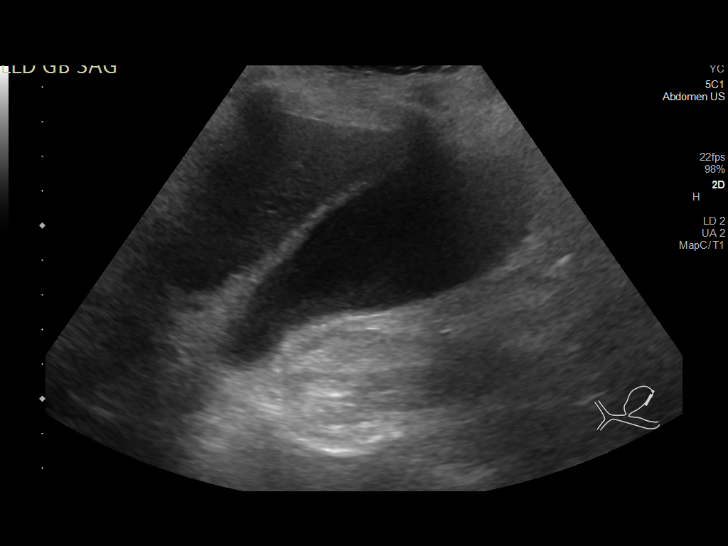
[im 21/55]
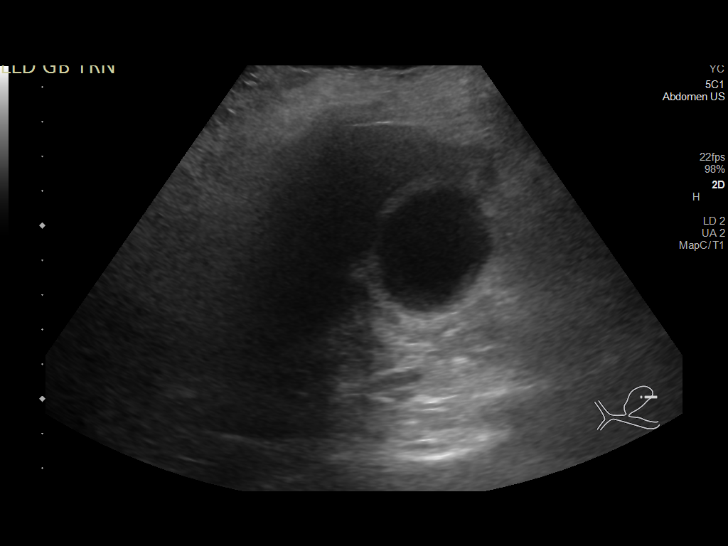
[im 25/55]
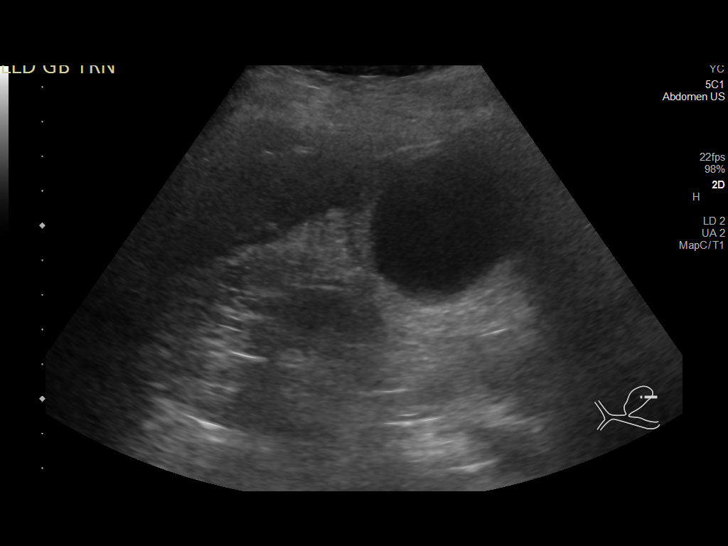
[im 30/55]
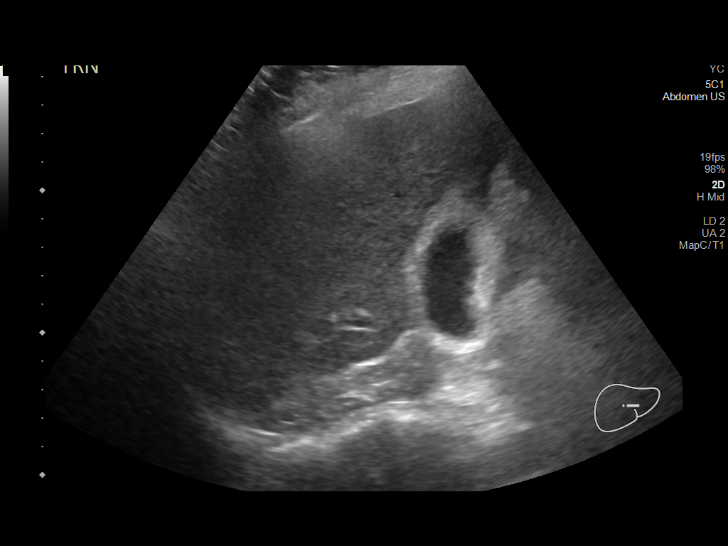
[im 34/55]
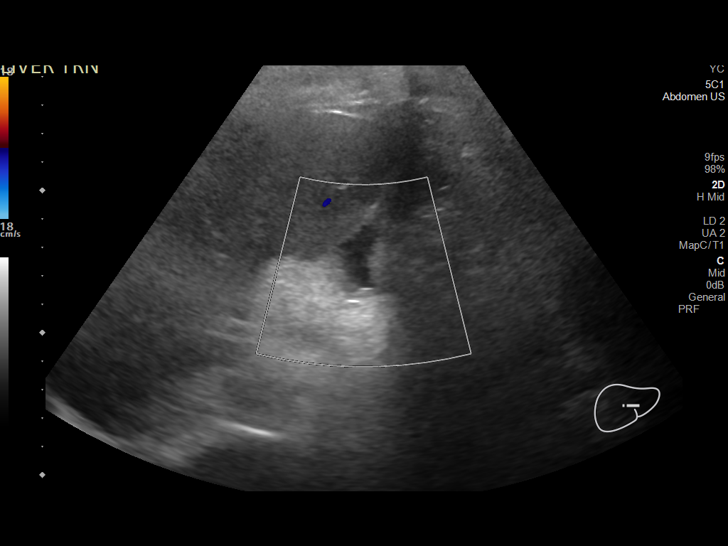
[im 37/55]
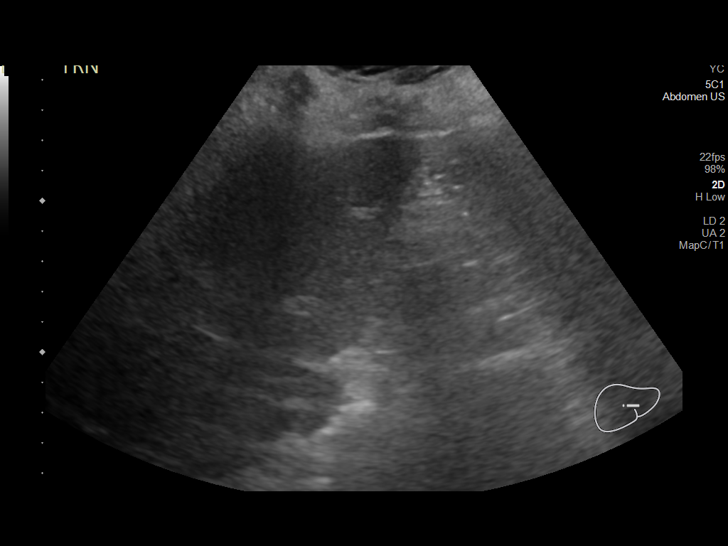
[im 41/55]
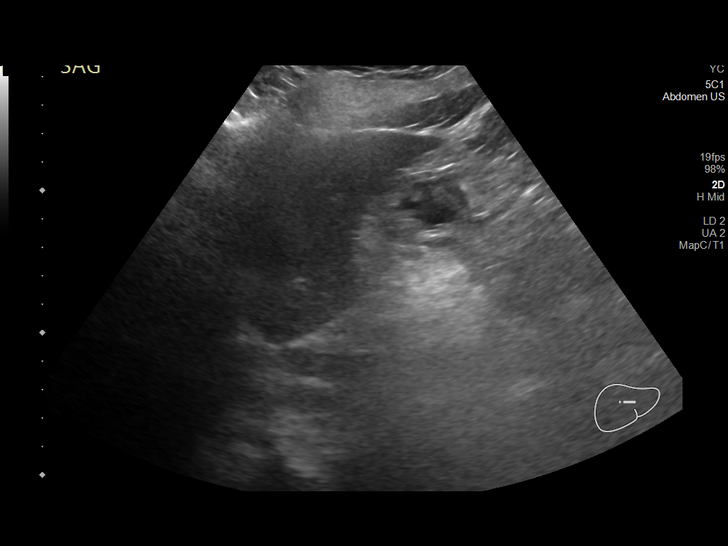
[im 46/55]
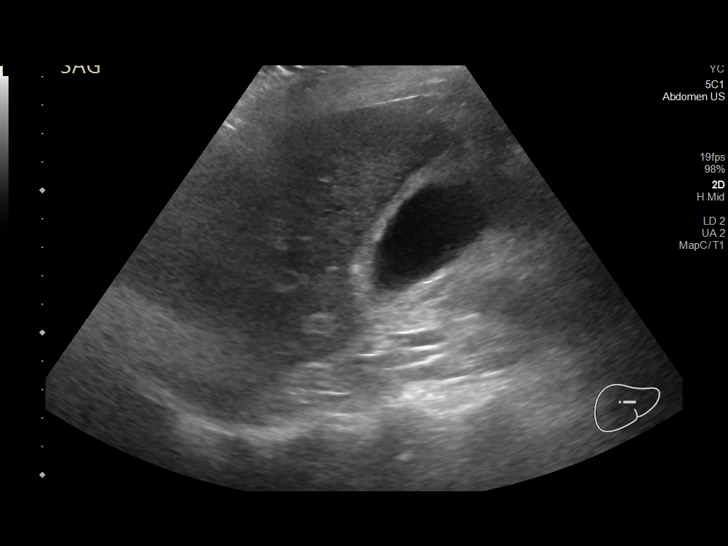
[im 50/55]
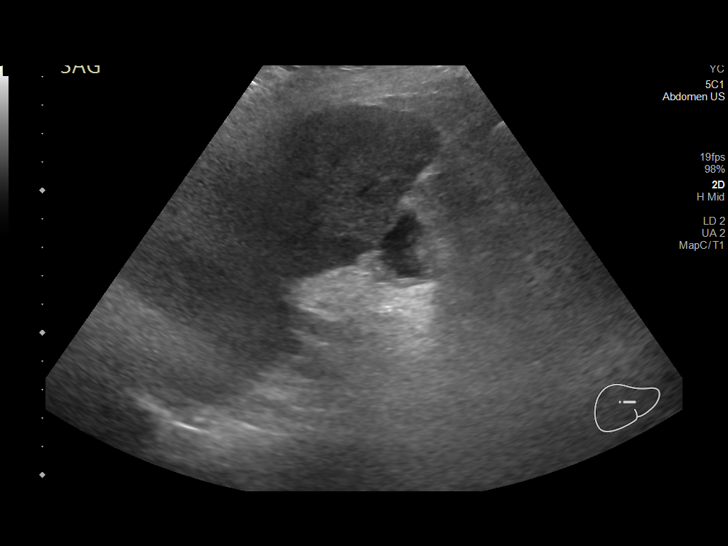
[im 55/55]
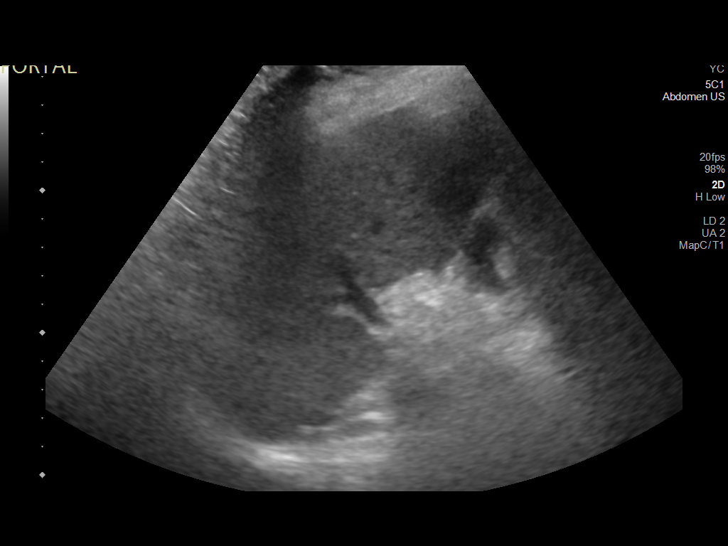

[14 of 25 positions shown; findings below may reference images not displayed]

FINDINGS: Gallbladder:

No visible shadowing gallstones or gallbladder polyps. Echogenic
biliary sludge is noted within the gallbladder lumen. Mild wall
thickening at 3.9 mm. Trace pericholecystic fluid. Sonographic
Murphy sign is reportedly negative.

Common bile duct:

Diameter: 2.6 mm, nondilated

Liver:

No focal lesion identified. Within normal limits in parenchymal
echogenicity. Portal vein is patent on color Doppler imaging with
normal direction of blood flow towards the liver.

Other: None.
IMPRESSION: Biliary sludge with a thickened gallbladder wall and pericholecystic
fluid. Sonographic Murphy sign is absent however findings remain
compatible with an acute cholecystitis in the appropriate clinical
setting.

## 2021-09-23 ENCOUNTER — Other Ambulatory Visit (INDEPENDENT_AMBULATORY_CARE_PROVIDER_SITE_OTHER): Payer: Self-pay | Admitting: Ophthalmology

## 2021-09-23 DIAGNOSIS — H44002 Unspecified purulent endophthalmitis, left eye: Secondary | ICD-10-CM

## 2021-09-27 ENCOUNTER — Other Ambulatory Visit (INDEPENDENT_AMBULATORY_CARE_PROVIDER_SITE_OTHER): Payer: Self-pay

## 2021-09-27 DIAGNOSIS — H44002 Unspecified purulent endophthalmitis, left eye: Secondary | ICD-10-CM

## 2021-09-27 MED ORDER — PREDNISOLONE ACETATE 1 % OP SUSP: 1.0000 [drp] | Freq: Every day | OPHTHALMIC | 5 refills | Status: DC

## 2022-02-07 NOTE — Progress Notes (Signed)
[facility] Note  02/19/2022     CHIEF COMPLAINT Patient presents for Retina Follow Up s/p phaco/PCIOL OS, 1.7.19, rg S/p PPV/AC washout/intravitreal vanc, ceftaz, cefepime OS, 1.11.19, bz  HISTORY OF PRESENT ILLNESS: Jennifer Estrada is a 76 y.o. female who presents to the clinic today for:   HPI     Retina Follow Up   Patient presents with  Other.  In left eye.  This started 12 months ago.  I, the attending physician,  performed the HPI with the patient and updated documentation appropriately.        Comments   Patient here for 12 months retina follow up for Endophthalmitis OS. Patient states vision has allergies OS itchy. Floaters on occasion. Has pain connected with sinus. Needs shots in knees. Uses systane prn.       Last edited by [name], MD on 02/19/2022 12:58 PM.    Pt states she has a hard time reading small print, she feels like her eyes are staying dry   Referring physician: [name], [place],  [place] [id number]  HISTORICAL INFORMATION:   Selected notes from the MEDICAL RECORD NUMBER Referred by Dr. [name] for concern of endophthalmitis OS  LEE- 01.11.19  Ocular Hx- CE/toric IOL OS (01.07.19 - R. Groat) - VA 1 day PO OS 20/30, VA OS (01.11.19) HM PMH-     CURRENT MEDICATIONS: Current Outpatient Medications (Ophthalmic Drugs)  Medication Sig   prednisoLONE acetate (PRED FORTE) 1 % ophthalmic suspension Place 1 drop into the left eye daily.   No current facility-administered medications for this visit. (Ophthalmic Drugs)   Current Outpatient Medications (Other)  Medication Sig   lansoprazole (PREVACID) 30 MG capsule Take 30 mg by mouth 2 (two) times daily.   PARoxetine (PAXIL) 20 MG tablet Take 1 tablet (20 mg total) by mouth every morning. (Patient taking differently: Take 20 mg by mouth daily.)   propranolol ER (INDERAL LA) 60 MG 24 hr capsule Take 1 capsule (60 mg total) by mouth daily.    rosuvastatin (CRESTOR) 5 MG tablet Take 1 tablet (5 mg total) by mouth at bedtime. (Patient taking differently: Take 5 mg by mouth 3 (three) times a week. Mon, Wed, Fri)   Vibegron (GEMTESA) 75 MG TABS Take 75 mg by mouth daily.   Vitamin D, Ergocalciferol, (DRISDOL) 1.25 MG (50000 UNIT) CAPS capsule Take 1 capsule (50,000 Units total) by mouth every 7 (seven) days. (Patient taking differently: Take 50,000 Units by mouth every 14 (fourteen) days.)   acetaminophen (TYLENOL) 500 MG tablet Take 1-2 tablets (500-1,000 mg total) by mouth every 6 (six) hours as needed. (Patient not taking: Reported on 02/19/2021)   dexlansoprazole (DEXILANT) 60 MG capsule Take 1 capsule (60 mg total) by mouth daily. (Patient not taking: Reported on 02/19/2021)   docusate sodium 100 MG CAPS Take 100 mg by mouth 2 (two) times daily. (Patient not taking: Reported on 02/19/2021)   fexofenadine (ALLEGRA) 30 MG tablet Take 30 mg by mouth as needed (allergies).  (Patient not taking: Reported on 02/19/2021)   lidocaine (LIDODERM) 5 % Place 1 patch onto the skin daily. Remove & Discard patch within 12 hours or as directed by MD (Patient not taking: Reported on 02/19/2021)   MYRBETRIQ 50 MG TB24 tablet Take 1 tablet (50 mg total) by mouth at bedtime. (Patient not taking: Reported on 02/19/2021)   sucralfate (CARAFATE) 1 g tablet Take 1 tablet (1 g total) by  mouth 4 (four) times daily -  with meals and at bedtime. (Patient not taking: Reported on 02/19/2021)   traMADol (ULTRAM) 50 MG tablet Take 1 tablet (50 mg total) by mouth every 6 (six) hours as needed (mild pain). (Patient not taking: Reported on 02/19/2021)   No current facility-administered medications for this visit. (Other)   REVIEW OF SYSTEMS: ROS   Positive for: Eyes Negative for: Constitutional, Gastrointestinal, Neurological, Skin, Genitourinary, Musculoskeletal, HENT, Endocrine, Cardiovascular, Respiratory, Psychiatric, Allergic/Imm, Heme/Lymph Last edited by Theodore Demark,  COA on 02/19/2022 10:31 AM.     ALLERGIES Allergies  Allergen Reactions   Atorvastatin Other (See Comments)    Feels anxous   Cortisone Other (See Comments)    headache   Oxycodone Other (See Comments)    confusion   Paroxetine Other (See Comments)    anxiety   Penicillins Other (See Comments)    Unknown per pt   Pravastatin Other (See Comments)    muscle pain    Singulair [Montelukast] Other (See Comments)    Chest pain   Sulfa Antibiotics Nausea Only   Aleve [Naproxen Sodium] Rash   Augmentin [Amoxicillin-Pot Clavulanate] Rash   Prednisone Other (See Comments)    headaches   PAST MEDICAL HISTORY Past Medical History:  Diagnosis Date   Allergic rhinitis    Chronic kidney disease    Depression    DJD (degenerative joint disease) of knee    GERD (gastroesophageal reflux disease)    Hyperlipidemia    Migraine    Takes Propranolol   Neuromuscular disorder ([place])    Body tremors at times,h/o falling cast right arm   Osteoporosis    Tremors of nervous system    Vitamin D deficiency    Past Surgical History:  Procedure Laterality Date   cataract surgery Left    CHOLECYSTECTOMY  08/19/2019   CHOLECYSTECTOMY N/A 08/19/2019   Procedure: LAPAROSCOPIC CHOLECYSTECTOMY;  Surgeon: Georganna Skeans, MD;  Location: [facility];  Service: General;  Laterality: N/A;   INTRAMEDULLARY (IM) NAIL INTERTROCHANTERIC Left 01/27/2019   Procedure: INTRAMEDULLARY (IM) NAIL INTERTROCHANTRIC;  Surgeon: Rod Can, MD;  Location: [facility];  Service: Orthopedics;  Laterality: Left;   KNEE SURGERY     Left Foot Surgery  20 years ago   20 years ago   ORIF PATELLA Left 02/12/2014   Procedure: OPEN REDUCTION INTERNAL (ORIF) FIXATION LEFT PATELLA;  Surgeon: Mauri Pole, MD;  Location: [facility];  Service: Orthopedics;  Laterality: Left;   PARS PLANA VITRECTOMY Left 02/21/2017   Procedure: PARS PLANA VITRECTOMY 25 GAUGE FOR ENDOPHTHALMITIS;  Surgeon: [name], MD;  Location: [facility];  Service:  Ophthalmology;  Laterality: Left;   WRIST FRACTURE SURGERY     FAMILY HISTORY Family History  Problem Relation Age of Onset   Alzheimer's disease Mother    Osteoporosis Father    GER disease Father    CVA Father    Breast cancer Neg Hx    SOCIAL HISTORY Social History   Tobacco Use   Smoking status: Never   Smokeless tobacco: Never  Vaping Use   Vaping Use: Never used  Substance Use Topics   Alcohol use: No   Drug use: No       OPHTHALMIC EXAM:  Base Eye Exam     Visual Acuity (Snellen - Linear)       Right Left   Dist cc 20/25 -1 20/30 -2   Dist ph cc 20/25 +2 20/30 +2    Correction: Glasses  Tonometry (Tonopen, 10:26 AM)       Right Left   Pressure 14 20         Pupils       Dark Light Shape React APD   Right 3 2 Round Brisk None   Left 6 6 Irregular NONE None         Visual Fields (Counting fingers)       Left Right    Full Full         Extraocular Movement       Right Left    Full, Ortho Full, Ortho         Neuro/Psych     Oriented x3: Yes   Mood/Affect: Normal         Dilation     Both eyes: 1.0% Mydriacyl, 2.5% Phenylephrine @ 10:25 AM           Slit Lamp and Fundus Exam     Slit Lamp Exam       Right Left   Lids/Lashes Dermatochalasis - upper lid, mild MGD Dermatochalasis - upper lid   Conjunctiva/Sclera White and quiet White and quiet   Cornea Arcus, 1+ Punctate epithelial erosions, well healed cataract wounds, mild tear film debris Arcus, nylon suture temporally, 2+ PEE, dry tear film, irregular surface inferiorly, decreased TBUT, well healed cataract wound   Anterior Chamber Deep and quiet Deep and quiet   Iris Round and dilated Irregular pupil with temporal iridectomy/iridodialysis   Lens PCIOL in good position PCIOL displaced superiorly, trace pigment on IOL   Anterior Vitreous Vitreous syneresis, Posterior vitreous detachment Post vitrectomy, pigmented vitreous condensations         Fundus  Exam       Right Left   Disc Tilted disc, mild temporal PPA, Pink and Sharp, +cupping tilted disc, inferior PPA, mild Pallor   C/D Ratio 0.6 0.6   Macula Flat, blunted foveal reflex, Retinal pigment epithelial mottling, No heme or edema flat; blunted foveal reflex, ERM with mild striae superiorly, Retinal pigment epithelial mottling, No heme   Vessels mild attenuation, mild tortuosity attenuated, mild tortuosity   Periphery Attached attached           Refraction     Wearing Rx       Sphere Cylinder Axis Add   Right -1.25 +1.00 069 +2.25   Left +0.50 +0.50 013 +2.25           IMAGING AND PROCEDURES  OCT, Retina - OU - Both Eyes       Right Eye Quality was good. Central Foveal Thickness: 281. Progression has been stable. Findings include normal foveal contour, no IRF, no SRF.   Left Eye Quality was good. Central Foveal Thickness: 307. Progression has been stable. Findings include no IRF, no SRF, abnormal foveal contour, epiretinal membrane, macular pucker, outer retinal atrophy (Blunted foveal contour, patchy ellipsoid atrophy, attached, nasal ERM with mild macular pucker).   Notes *Images captured and stored on drive  Diagnosis / Impression:  OD: NFP, No IRF/SRF  OS: Blunted foveal contour, patchy ellipsoid atrophy, nasal ERM with mild macular pucker, attached, no IRF/SRF -- stable  Clinical management:  See below  Abbreviations: NFP - Normal foveal profile. CME - cystoid macular edema. PED - pigment epithelial detachment. IRF - intraretinal fluid. SRF - subretinal fluid. EZ - ellipsoid zone. ERM - epiretinal membrane. ORA - outer retinal atrophy. ORT - outer retinal tubulation. SRHM - subretinal hyper-reflective material  ASSESSMENT/PLAN:    ICD-10-CM   1. 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 Iridodialysis of left  eye  H21.532     4. Pseudophakia of both eyes  Z96.1     5. Epiretinal membrane (ERM) of left eye  H35.372      1. Endophthalmitis OS- resolved  - presented on POD4 s/p phaco/PCIOL on Monday, 01.7.19  - VA HM OS w/ 0.97mm hypopyon, fibrin membrane and irregular pupil, and dense vitreous opacities on b-scan ultrasound  - s/p PPV + AC washout + intravitreal vanc, ceftaz, and cefepime, OS, on 01.11.19  - extensive AC and posterior debris / purulent material noted intra op  - gram stain and culture positive for coag negative staph  - today, doing well with stable VA with just tr residual vit debris inferiorly  - BCVA stable at 20/30 +2             - IOP 15 OS today             - cont PF Qdaily OS   - cont AT OU QID  - f/u 1 yr, sooner prn  2. Iridodialysis OS  - sequelae of endophthalmitis and surgery  - pt with significant glare symptoms, but tolerating for now  - monitor  - if symptoms worsen or fail to improve, consider referral to anterior segment surgeon for evaluation  - discussed possibility of corneal tattoo to improve glare symptoms  - pt wishes to hold off on further procedures at this time   3. Pseudophakia OU             - s/p phaco/PCIOL OD (Dr. Elmer Picker, 10.19.21)  - s/p phaco/PCIOL OS on Monday, 11.7.19 (Dr. Hortense Ramal)  - OS: toric IOL displaced superiorly, but stable  - BCVA 20/30 OS -- stable  - do not recommend surgical intervention at this time  4 ERM OS  - mild nasal ERM OS with some pucker  - stable from prior  - do not recommend surgical intervention at this time  - monitor  Ophthalmic Meds Ordered this visit:  Meds ordered this encounter  Medications   prednisoLONE acetate (PRED FORTE) 1 % ophthalmic suspension    Sig: Place 1 drop into the left eye daily.    Dispense:  15 mL    Refill:  5     Return in about 1 year (around 02/20/2023) for f/u endophthalmitis OS, DFE, OCT.  There are no Patient Instructions on file for this visit.   This document  serves as a record of services personally performed by Karie Chimera, MD, PhD. It was created on their behalf by Gerilyn Nestle, COT an ophthalmic technician. The creation of this record is the provider's dictation and/or activities during the visit.    Electronically signed by:  Gerilyn Nestle, COT  12.28.23 12:58 PM  This document serves as a record of services personally performed by Karie Chimera, MD, PhD. It was created on their behalf by Glee Arvin. Manson Passey, OA an ophthalmic technician. The creation of this record is the provider's dictation and/or activities during the visit.    Electronically signed by: Glee Arvin. Kristopher Oppenheim 01.09.2024 12:58 PM  Karie Chimera, M.D., Ph.D. Diseases & Surgery of the Retina and Vitreous [facility]  I have reviewed  the above documentation for accuracy and completeness, and I agree with the above. Gardiner Sleeper, M.D., Ph.D. 02/19/22 1:00 PM   Abbreviations: M myopia (nearsighted); A astigmatism; H hyperopia (farsighted); P presbyopia; Mrx spectacle prescription;  CTL contact lenses; OD right eye; OS left eye; OU both eyes  XT exotropia; ET esotropia; PEK punctate epithelial keratitis; PEE punctate epithelial erosions; DES dry eye syndrome; MGD meibomian gland dysfunction; ATs artificial tears; PFAT's preservative free artificial tears; [place] nuclear sclerotic cataract; PSC posterior subcapsular cataract; ERM epi-retinal membrane; PVD posterior vitreous detachment; RD retinal detachment; DM diabetes mellitus; DR diabetic retinopathy; NPDR non-proliferative diabetic retinopathy; PDR proliferative diabetic retinopathy; CSME clinically significant macular edema; DME diabetic macular edema; dbh dot blot hemorrhages; CWS cotton wool spot; POAG primary open angle glaucoma; C/D cup-to-disc ratio; HVF humphrey visual field; GVF goldmann visual field; OCT optical coherence tomography; IOP intraocular pressure; BRVO Branch retinal vein  occlusion; CRVO central retinal vein occlusion; CRAO central retinal artery occlusion; BRAO branch retinal artery occlusion; RT retinal tear; SB scleral buckle; PPV pars plana vitrectomy; VH Vitreous hemorrhage; PRP panretinal laser photocoagulation; IVK intravitreal kenalog; VMT vitreomacular traction; MH Macular hole;  NVD neovascularization of the disc; NVE neovascularization elsewhere; AREDS age related eye disease study; ARMD age related macular degeneration; POAG primary open angle glaucoma; EBMD epithelial/anterior basement membrane dystrophy; ACIOL anterior chamber intraocular lens; IOL intraocular lens; PCIOL posterior chamber intraocular lens; Phaco/IOL phacoemulsification with intraocular lens placement; [place] photorefractive keratectomy; LASIK laser assisted in situ keratomileusis; HTN hypertension; DM diabetes mellitus; COPD chronic obstructive pulmonary disease

## 2022-02-19 ENCOUNTER — Encounter (INDEPENDENT_AMBULATORY_CARE_PROVIDER_SITE_OTHER): Payer: Self-pay | Admitting: Ophthalmology

## 2022-02-19 ENCOUNTER — Ambulatory Visit (INDEPENDENT_AMBULATORY_CARE_PROVIDER_SITE_OTHER): Payer: Medicare Other | Admitting: Ophthalmology

## 2022-02-19 DIAGNOSIS — H21532 Iridodialysis, left eye: Secondary | ICD-10-CM

## 2022-02-19 DIAGNOSIS — Z961 Presence of intraocular lens: Secondary | ICD-10-CM

## 2022-02-19 DIAGNOSIS — H35372 Puckering of macula, left eye: Secondary | ICD-10-CM | POA: Diagnosis not present

## 2022-02-19 DIAGNOSIS — H44002 Unspecified purulent endophthalmitis, left eye: Secondary | ICD-10-CM | POA: Diagnosis not present

## 2022-02-19 MED ORDER — PREDNISOLONE ACETATE 1 % OP SUSP: 1.0000 [drp] | Freq: Every day | OPHTHALMIC | 5 refills | Status: AC

## 2022-06-18 ENCOUNTER — Other Ambulatory Visit: Payer: Self-pay | Admitting: Internal Medicine

## 2022-06-18 DIAGNOSIS — Z1231 Encounter for screening mammogram for malignant neoplasm of breast: Secondary | ICD-10-CM

## 2022-07-16 ENCOUNTER — Ambulatory Visit
Admission: RE | Admit: 2022-07-16 | Discharge: 2022-07-16 | Disposition: A | Discharge status: Home / Self Care | Payer: Medicare Other | Source: Ambulatory Visit | Attending: Internal Medicine | Admitting: Internal Medicine

## 2022-07-16 DIAGNOSIS — Z1231 Encounter for screening mammogram for malignant neoplasm of breast: Secondary | ICD-10-CM

## 2023-02-17 NOTE — Progress Notes (Signed)
 [facility] - Clinic Note  02/19/2023     CHIEF COMPLAINT Patient presents for Retina Follow Up s/p phaco/PCIOL OS, 1.7.19, rg S/p PPV/AC washout/intravitreal vanc, ceftaz, cefepime  OS, 1.11.19, bz  HISTORY OF PRESENT ILLNESS: Jennifer Estrada is a 78 y.o. female who presents to the clinic today for:   HPI     Retina Follow Up   Patient presents with  Other.  In left eye.  This started 1 year ago.  I, the attending physician,  performed the HPI with the patient and updated documentation appropriately.        Comments   Patient here for 1 year retina follow up for endophthalmitis OS. Patient states vision is ok. Hot better. Has new glasses. Has glaucoma using drops. Pink top BID OS and white top QAM OS. Has abscessed teeth since thanksgiving. No eye pain now. Did from teeth.       Last edited by Kassity Woodson, MD on 02/19/2023  5:28 PM.    Pt states states she got new glasses and they are helping her vision   Referring physician: Frazier, Chad, OD [address] [place],  [place] [address]  HISTORICAL INFORMATION:   Selected notes from the MEDICAL RECORD NUMBER Referred by Dr. FABIENE Gaudy for concern of endophthalmitis OS  LEE- 01.11.19  Ocular Hx- CE/toric IOL OS (01.07.19 - R. Groat) - VA 1 day PO OS 20/30, VA OS (01.11.19) HM PMH-     CURRENT MEDICATIONS: Current Outpatient Medications (Ophthalmic Drugs)  Medication Sig   prednisoLONE  acetate (PRED FORTE ) 1 % ophthalmic suspension Place 1 drop into the left eye daily.   No current facility-administered medications for this visit. (Ophthalmic Drugs)   Current Outpatient Medications (Other)  Medication Sig   lansoprazole (PREVACID) 30 MG capsule Take 30 mg by mouth 2 (two) times daily.   propranolol  ER (INDERAL  LA) 60 MG 24 hr capsule Take 1 capsule (60 mg total) by mouth daily.   rosuvastatin  (CRESTOR ) 5 MG tablet Take 1 tablet (5 mg total) by mouth at bedtime. (Patient taking differently:  Take 5 mg by mouth 3 (three) times a week. Mon, Wed, Fri)   Vibegron (GEMTESA) 75 MG TABS Take 75 mg by mouth daily.   Vitamin D , Ergocalciferol , (DRISDOL ) 1.25 MG (50000 UNIT) CAPS capsule Take 1 capsule (50,000 Units total) by mouth every 7 (seven) days. (Patient taking differently: Take 50,000 Units by mouth every 14 (fourteen) days.)   acetaminophen  (TYLENOL ) 500 MG tablet Take 1-2 tablets (500-1,000 mg total) by mouth every 6 (six) hours as needed. (Patient not taking: Reported on 02/19/2023)   dexlansoprazole  (DEXILANT ) 60 MG capsule Take 1 capsule (60 mg total) by mouth daily. (Patient not taking: Reported on 02/19/2021)   docusate sodium  100 MG CAPS Take 100 mg by mouth 2 (two) times daily. (Patient not taking: Reported on 02/19/2021)   fexofenadine (ALLEGRA) 30 MG tablet Take 30 mg by mouth as needed (allergies).  (Patient not taking: Reported on 02/19/2021)   lidocaine  (LIDODERM ) 5 % Place 1 patch onto the skin daily. Remove & Discard patch within 12 hours or as directed by MD (Patient not taking: Reported on 02/19/2023)   MYRBETRIQ  50 MG TB24 tablet Take 1 tablet (50 mg total) by mouth at bedtime. (Patient not taking: Reported on 02/19/2021)   PARoxetine  (PAXIL ) 20 MG tablet Take 1 tablet (20 mg total) by mouth every morning. (Patient not taking: Reported on 02/19/2023)   sucralfate  (CARAFATE ) 1 g tablet  Take 1 tablet (1 g total) by mouth 4 (four) times daily -  with meals and at bedtime. (Patient not taking: Reported on 02/19/2021)   traMADol  (ULTRAM ) 50 MG tablet Take 1 tablet (50 mg total) by mouth every 6 (six) hours as needed (mild pain). (Patient not taking: Reported on 02/19/2023)   No current facility-administered medications for this visit. (Other)   REVIEW OF SYSTEMS: ROS   Positive for: Eyes Negative for: Constitutional, Gastrointestinal, Neurological, Skin, Genitourinary, Musculoskeletal, HENT, Endocrine, Cardiovascular, Respiratory, Psychiatric, Allergic/Imm, Heme/Lymph Last edited by  Orval Asberry RAMAN, COA on 02/19/2023  9:17 AM.      ALLERGIES Allergies  Allergen Reactions   Atorvastatin Other (See Comments)    Feels anxous   Cortisone Other (See Comments)    headache   Oxycodone  Other (See Comments)    confusion   Paroxetine  Other (See Comments)    anxiety   Penicillins Other (See Comments)    Unknown per pt   Pravastatin Other (See Comments)    muscle pain    Singulair [Montelukast] Other (See Comments)    Chest pain   Sulfa Antibiotics Nausea Only   Aleve [Naproxen Sodium] Rash   Augmentin [Amoxicillin-Pot Clavulanate] Rash   Prednisone Other (See Comments)    headaches   PAST MEDICAL HISTORY Past Medical History:  Diagnosis Date   Allergic rhinitis    Chronic kidney disease    Depression    DJD (degenerative joint disease) of knee    GERD (gastroesophageal reflux disease)    Hyperlipidemia    Migraine    Takes Propranolol    Neuromuscular disorder (HCC)    Body tremors at times,h/o falling cast right arm   Osteoporosis    Tremors of nervous system    Vitamin D  deficiency    Past Surgical History:  Procedure Laterality Date   cataract surgery Left    CHOLECYSTECTOMY  08/19/2019   CHOLECYSTECTOMY N/A 08/19/2019   Procedure: LAPAROSCOPIC CHOLECYSTECTOMY;  Surgeon: Sebastian Moles, MD;  Location: [facility] OR;  Service: General;  Laterality: N/A;   INTRAMEDULLARY (IM) NAIL INTERTROCHANTERIC Left 01/27/2019   Procedure: INTRAMEDULLARY (IM) NAIL INTERTROCHANTRIC;  Surgeon: Fidel Rogue, MD;  Location: [facility] ORS;  Service: Orthopedics;  Laterality: Left;   KNEE SURGERY     Left Foot Surgery  20 years ago   20 years ago   ORIF PATELLA Left 02/12/2014   Procedure: OPEN REDUCTION INTERNAL (ORIF) FIXATION LEFT PATELLA;  Surgeon: Donnice JONETTA Car, MD;  Location: [facility] ORS;  Service: Orthopedics;  Laterality: Left;   PARS PLANA VITRECTOMY Left 02/21/2017   Procedure: PARS PLANA VITRECTOMY 25 GAUGE FOR ENDOPHTHALMITIS;  Surgeon: Valdemar Rogue, MD;  Location: [facility] OR;   Service: Ophthalmology;  Laterality: Left;   WRIST FRACTURE SURGERY     FAMILY HISTORY Family History  Problem Relation Age of Onset   Alzheimer's disease Mother    Osteoporosis Father    GER disease Father    CVA Father    Breast cancer Neg Hx    SOCIAL HISTORY Social History   Tobacco Use   Smoking status: Never   Smokeless tobacco: Never  Vaping Use   Vaping status: Never Used  Substance Use Topics   Alcohol  use: No   Drug use: No       OPHTHALMIC EXAM:  Base Eye Exam     Visual Acuity (Snellen - Linear)       Right Left   Dist cc 20/20 -1 20/30 -2   Dist ph cc  20/25 -  2    Correction: Glasses         Tonometry (Tonopen, 9:12 AM)       Right Left   Pressure 11 13         Pupils       Dark Light Shape React APD   Right 3 2 Round Brisk None   Left 6 6 Irregular NR None         Visual Fields (Counting fingers)       Left Right    Full Full         Extraocular Movement       Right Left    Full, Ortho Full, Ortho         Neuro/Psych     Oriented x3: Yes   Mood/Affect: Normal         Dilation     Both eyes: 1.0% Mydriacyl , 2.5% Phenylephrine  @ 9:12 AM           Slit Lamp and Fundus Exam     Slit Lamp Exam       Right Left   Lids/Lashes Dermatochalasis - upper lid, mild MGD Dermatochalasis - upper lid, mild MGD   Conjunctiva/Sclera White and quiet White and quiet   Cornea Arcus, well healed cataract wounds, trace tear film debris, 1+ Guttata Arcus, nylon suture temporally, 2+ PEE, well healed cataract wound   Anterior Chamber Deep and quiet Deep and quiet   Iris Round and dilated Irregular pupil with temporal iridectomy/iridodialysis   Lens PCIOL in good position PCIOL displaced superiorly   Anterior Vitreous Vitreous syneresis, Posterior vitreous detachment Post vitrectomy, pigmented vitreous condensations         Fundus Exam       Right Left   Disc Tilted disc, mild temporal PPA, Pink and Sharp, +cupping  tilted disc, inferior PPA, mild Pallor   C/D Ratio 0.6 0.6   Macula Flat, blunted foveal reflex, Retinal pigment epithelial mottling, No heme or edema flat; blunted foveal reflex, ERM with mild striae superiorly, Retinal pigment epithelial mottling, No heme   Vessels mild attenuation, mild tortuosity attenuated, mild tortuosity   Periphery Attached attached           Refraction     Wearing Rx       Sphere Cylinder Axis Add   Right -0.75 +0.25 009 +2.75   Left +0.25 +0.25 045 +2.75           IMAGING AND PROCEDURES  OCT, Retina - OU - Both Eyes       Right Eye Quality was good. Central Foveal Thickness: 276. Progression has been stable. Findings include normal foveal contour, no IRF, no SRF.   Left Eye Quality was good. Central Foveal Thickness: 297. Progression has been stable. Findings include no IRF, no SRF, abnormal foveal contour, epiretinal membrane, macular pucker, outer retinal atrophy (Blunted foveal contour, patchy ellipsoid atrophy, nasal ERM with mild macular pucker).   Notes *Images captured and stored on drive  Diagnosis / Impression:  OD: NFP, No IRF/SRF  OS: Blunted foveal contour, patchy ellipsoid atrophy, nasal ERM with mild macular pucker, attached, no IRF/SRF -- stable  Clinical management:  See below  Abbreviations: NFP - Normal foveal profile. CME - cystoid macular edema. PED - pigment epithelial detachment. IRF - intraretinal fluid. SRF - subretinal fluid. EZ - ellipsoid zone. ERM - epiretinal membrane. ORA - outer retinal atrophy. ORT - outer retinal tubulation. SRHM - subretinal hyper-reflective material  ASSESSMENT/PLAN:    ICD-10-CM   1. 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 Iridodialysis of left eye  H21.532     3. Pseudophakia of both eyes  Z96.1     4. Epiretinal membrane (ERM) of left eye  H35.372 OCT, Retina - OU - Both Eyes     1. Endophthalmitis OS- resolved  - presented on POD4 s/p phaco/PCIOL on Monday,  01.7.19  - VA HM OS w/ 0.2mm hypopyon, fibrin membrane and irregular pupil, and dense vitreous opacities on b-scan ultrasound  - s/p PPV + AC washout + intravitreal vanc, ceftaz, and cefepime , OS, on 01.11.19  - extensive AC and posterior debris / purulent material noted intra op  - gram stain and culture positive for coag negative staph  - today, doing well with stable VA at 20/25!             - IOP 15 OS today             - cont PF Qdaily OS   - cont AT OU QID  - pt is cleared from a retina standpoint for release to Dr. Vivian and resumption of primary eye care  2. Iridodialysis OS  - sequelae of endophthalmitis and surgery  - pt with history of significant glare symptoms -- improved  - monitor  - if symptoms worsen or fail to improve, consider referral to anterior segment surgeon for evaluation  - discussed possibility of corneal tattoo to improve glare symptoms  - pt wishes to hold off on further procedures at this time   3. Pseudophakia OU             - s/p phaco/PCIOL OD (Dr. Cleatus, 10.19.21)  - s/p phaco/PCIOL OS on Monday, 11.7.19 (Dr. FABIENE Gaudy)  - OS: toric IOL displaced superiorly, but stable  - BCVA 20/25 OS -- stable  - do not recommend surgical intervention at this time  4 ERM OS  - mild nasal ERM OS with some pucker  - stable from prior  - do not recommend surgical intervention at this time  - monitor  Ophthalmic Meds Ordered this visit:  No orders of the defined types were placed in this encounter.    Return if symptoms worsen or fail to improve.  There are no Patient Instructions on file for this visit.   This document serves as a record of services personally performed by Redell JUDITHANN Hans, MD, PhD. It was created on their behalf by Alan PARAS. Delores, OA an ophthalmic technician. The creation of this record is the provider's dictation and/or activities during the visit.    Electronically signed by: Alan PARAS. Delores, OA 02/19/23 5:29 PM  Redell JUDITHANN Hans,  M.D., Ph.D. Diseases & Surgery of the Retina and Vitreous [facility]  I have reviewed the above documentation for accuracy and completeness, and I agree with the above. Redell JUDITHANN Hans, M.D., Ph.D. 02/19/23 5:32 PM   Abbreviations: M myopia (nearsighted); A astigmatism; H hyperopia (farsighted); P presbyopia; Mrx spectacle prescription;  CTL contact lenses; OD right eye; OS left eye; OU both eyes  XT exotropia; ET esotropia; PEK punctate epithelial keratitis; PEE punctate epithelial erosions; DES dry eye syndrome; MGD meibomian gland dysfunction; ATs artificial tears; PFAT's preservative free artificial tears; NSC nuclear sclerotic cataract; PSC posterior subcapsular cataract; ERM epi-retinal membrane; PVD posterior vitreous detachment; RD retinal detachment; DM diabetes mellitus; DR diabetic retinopathy; NPDR non-proliferative diabetic retinopathy; PDR proliferative diabetic retinopathy; CSME clinically significant  macular edema; DME diabetic macular edema; dbh dot blot hemorrhages; CWS cotton wool spot; POAG primary open angle glaucoma; C/D cup-to-disc ratio; HVF humphrey visual field; GVF goldmann visual field; OCT optical coherence tomography; IOP intraocular pressure; BRVO Branch retinal vein occlusion; CRVO central retinal vein occlusion; CRAO central retinal artery occlusion; BRAO branch retinal artery occlusion; RT retinal tear; SB scleral buckle; PPV pars plana vitrectomy; VH Vitreous hemorrhage; PRP panretinal laser photocoagulation; IVK intravitreal kenalog ; VMT vitreomacular traction; MH Macular hole;  NVD neovascularization of the disc; NVE neovascularization elsewhere; AREDS age related eye disease study; ARMD age related macular degeneration; POAG primary open angle glaucoma; EBMD epithelial/anterior basement membrane dystrophy; ACIOL anterior chamber intraocular lens; IOL intraocular lens; PCIOL posterior chamber intraocular lens; Phaco/IOL phacoemulsification with  intraocular lens placement; PRK photorefractive keratectomy; LASIK laser assisted in situ keratomileusis; HTN hypertension; DM diabetes mellitus; COPD chronic obstructive pulmonary disease

## 2023-02-19 ENCOUNTER — Encounter (INDEPENDENT_AMBULATORY_CARE_PROVIDER_SITE_OTHER): Payer: Self-pay | Admitting: Ophthalmology

## 2023-02-19 ENCOUNTER — Ambulatory Visit (INDEPENDENT_AMBULATORY_CARE_PROVIDER_SITE_OTHER): Payer: Medicare Other | Admitting: Ophthalmology

## 2023-02-19 DIAGNOSIS — H44002 Unspecified purulent endophthalmitis, left eye: Secondary | ICD-10-CM | POA: Diagnosis not present

## 2023-02-19 DIAGNOSIS — H21532 Iridodialysis, left eye: Secondary | ICD-10-CM

## 2023-02-19 DIAGNOSIS — H35372 Puckering of macula, left eye: Secondary | ICD-10-CM | POA: Diagnosis not present

## 2023-02-19 DIAGNOSIS — Z961 Presence of intraocular lens: Secondary | ICD-10-CM | POA: Diagnosis not present

## 2023-05-18 ENCOUNTER — Encounter (HOSPITAL_BASED_OUTPATIENT_CLINIC_OR_DEPARTMENT_OTHER): Payer: Self-pay | Admitting: Emergency Medicine

## 2023-05-18 ENCOUNTER — Other Ambulatory Visit: Payer: Self-pay

## 2023-05-18 ENCOUNTER — Emergency Department (HOSPITAL_BASED_OUTPATIENT_CLINIC_OR_DEPARTMENT_OTHER)
Admission: EM | Admit: 2023-05-18 | Discharge: 2023-05-18 | Disposition: A | Discharge status: Home / Self Care | Attending: Emergency Medicine | Admitting: Emergency Medicine

## 2023-05-18 ENCOUNTER — Emergency Department (HOSPITAL_BASED_OUTPATIENT_CLINIC_OR_DEPARTMENT_OTHER)

## 2023-05-18 DIAGNOSIS — R0602 Shortness of breath: Secondary | ICD-10-CM | POA: Diagnosis present

## 2023-05-18 DIAGNOSIS — N189 Chronic kidney disease, unspecified: Secondary | ICD-10-CM | POA: Insufficient documentation

## 2023-05-18 DIAGNOSIS — U071 COVID-19: Secondary | ICD-10-CM | POA: Insufficient documentation

## 2023-05-18 LAB — COMPREHENSIVE METABOLIC PANEL WITH GFR
ALT: 37 U/L (ref 0–44)
AST: 52 U/L — ABNORMAL HIGH (ref 15–41)
Albumin: 3.4 g/dL — ABNORMAL LOW (ref 3.5–5.0)
Alkaline Phosphatase: 48 U/L (ref 38–126)
Anion gap: 10 (ref 5–15)
BUN: 14 mg/dL (ref 8–23)
CO2: 23 mmol/L (ref 22–32)
Calcium: 9.3 mg/dL (ref 8.9–10.3)
Chloride: 105 mmol/L (ref 98–111)
Creatinine, Ser: 0.86 mg/dL (ref 0.44–1.00)
GFR, Estimated: >60 mL/min (ref >60)
Glucose, Bld: 106 mg/dL — ABNORMAL HIGH (ref 70–99)
Potassium: 3.5 mmol/L (ref 3.5–5.1)
Sodium: 138 mmol/L (ref 135–145)
Total Bilirubin: 1.1 mg/dL (ref 0.0–1.2)
Total Protein: 6.6 g/dL (ref 6.5–8.1)

## 2023-05-18 LAB — CBC WITH DIFFERENTIAL/PLATELET
Abs Immature Granulocytes: 0 10*3/uL (ref 0.00–0.07)
Basophils Absolute: 0 10*3/uL (ref 0.0–0.1)
Basophils Relative: 0 %
Eosinophils Absolute: 0 10*3/uL (ref 0.0–0.5)
Eosinophils Relative: 1 %
HCT: 41.3 % (ref 36.0–46.0)
Hemoglobin: 14 g/dL (ref 12.0–15.0)
Immature Granulocytes: 0 %
Lymphocytes Relative: 60 %
Lymphs Abs: 2.7 10*3/uL (ref 0.7–4.0)
MCH: 32.3 pg (ref 26.0–34.0)
MCHC: 33.9 g/dL (ref 30.0–36.0)
MCV: 95.2 fL (ref 80.0–100.0)
Monocytes Absolute: 0.4 10*3/uL (ref 0.1–1.0)
Monocytes Relative: 8 %
Neutro Abs: 1.4 10*3/uL — ABNORMAL LOW (ref 1.7–7.7)
Neutrophils Relative %: 31 %
Platelets: 179 10*3/uL (ref 150–400)
RBC: 4.34 MIL/uL (ref 3.87–5.11)
RDW: 13.1 % (ref 11.5–15.5)
WBC: 4.4 10*3/uL (ref 4.0–10.5)
nRBC: 0 % (ref 0.0–0.2)

## 2023-05-18 LAB — RESP PANEL BY RT-PCR (RSV, FLU A&B, COVID)  RVPGX2
Influenza A by PCR: NEGATIVE
Influenza B by PCR: NEGATIVE
Resp Syncytial Virus by PCR: NEGATIVE
SARS Coronavirus 2 by RT PCR: POSITIVE — AB

## 2023-05-18 MED ORDER — MOLNUPIRAVIR EUA 200MG CAPSULE: 4.0000 | ORAL_CAPSULE | Freq: Two times a day (BID) | ORAL | 0 refills | Status: AC

## 2023-05-18 NOTE — ED Triage Notes (Signed)
 Pt was seen at [facility] yesterday and dx with PNA, started on Augmentin, pt is ShoB, RT called to triage, diminished bilaterally, pt seems anxious in triage

## 2023-05-18 NOTE — Discharge Instructions (Addendum)
 Your workup today was reassuring.  Blood work, chest x-ray did not show any concerning findings.  Notably no pneumonia.  You did test positive for COVID.  I have sent in a medication called molnupiravir into the pharmacy for you.  Follow-up with your primary care doctor in the next 2 to 3 days for reevaluation.  For any worsening symptoms return to the emergency room.

## 2023-05-18 NOTE — ED Provider Notes (Signed)
 [place] [facility] Provider Note   CSN: [id number] Arrival date & time: 05/18/23  1437     History  Chief Complaint  Patient presents with   Shortness of Breath    Jennifer Estrada is a 78 y.o. female.  79 year old female presents today for concern of persistent upper respiratory infection symptoms particularly cough.  She was seen yesterday at an urgent care and was started on Augmentin but states that this made her nauseous.  She denies any chest pain or significant shortness of breath.  She is able to speak in complete sentences without issue.  Denies fever.  She states she is now been sick for 2 weeks without improvement.  She has seen her PCP in addition to the urgent care and this is her third visit.  The history is provided by the patient. No language interpreter was used.       Home Medications Prior to Admission medications   Medication Sig Start Date End Date Taking? Authorizing Provider  molnupiravir EUA (LAGEVRIO) 200 mg CAPS capsule Take 4 capsules (800 mg total) by mouth 2 (two) times daily for 5 days. 05/18/23 05/23/23 Yes Sofya Moustafa, PA-C  acetaminophen (TYLENOL) 500 MG tablet Take 1-2 tablets (500-1,000 mg total) by mouth every 6 (six) hours as needed. Patient not taking: Reported on 02/19/2023 08/20/19   Adam Phenix, PA-C  dexlansoprazole (DEXILANT) 60 MG capsule Take 1 capsule (60 mg total) by mouth daily. Patient not taking: Reported on 02/19/2021 02/25/19   Medina-Vargas, Monina C, NP  docusate sodium 100 MG CAPS Take 100 mg by mouth 2 (two) times daily. Patient not taking: Reported on 02/19/2021 02/14/14   Lanney Gins, PA-C  fexofenadine (ALLEGRA) 30 MG tablet Take 30 mg by mouth as needed (allergies).  Patient not taking: Reported on 02/19/2021    [provider]  lansoprazole (PREVACID) 30 MG capsule Take 30 mg by mouth 2 (two) times daily.    [provider]  lidocaine (LIDODERM) 5 % Place 1 patch onto  the skin daily. Remove & Discard patch within 12 hours or as directed by MD Patient not taking: Reported on 02/19/2023 08/21/19   Adam Phenix, PA-C  MYRBETRIQ 50 MG TB24 tablet Take 1 tablet (50 mg total) by mouth at bedtime. Patient not taking: Reported on 02/19/2021 02/25/19   Medina-Vargas, Monina C, NP  PARoxetine (PAXIL) 20 MG tablet Take 1 tablet (20 mg total) by mouth every morning. Patient not taking: Reported on 02/19/2023 02/25/19   Medina-Vargas, Monina C, NP  prednisoLONE acetate (PRED FORTE) 1 % ophthalmic suspension Place 1 drop into the left eye daily. 02/19/22   [name], MD  propranolol ER (INDERAL LA) 60 MG 24 hr capsule Take 1 capsule (60 mg total) by mouth daily. 02/25/19   Medina-Vargas, Monina C, NP  rosuvastatin (CRESTOR) 5 MG tablet Take 1 tablet (5 mg total) by mouth at bedtime. Patient taking differently: Take 5 mg by mouth 3 (three) times a week. Mon, Wed, Fri 02/25/19   Medina-Vargas, Monina C, NP  sucralfate (CARAFATE) 1 g tablet Take 1 tablet (1 g total) by mouth 4 (four) times daily -  with meals and at bedtime. Patient not taking: Reported on 02/19/2021 07/19/19   Derwood Kaplan, MD  traMADol (ULTRAM) 50 MG tablet Take 1 tablet (50 mg total) by mouth every 6 (six) hours as needed (mild pain). Patient not taking: Reported on 02/19/2023 08/20/19   Adam Phenix, PA-C  Vibegron (  GEMTESA) 75 MG TABS Take 75 mg by mouth daily.    [provider]  Vitamin D, Ergocalciferol, (DRISDOL) 1.25 MG (50000 UNIT) CAPS capsule Take 1 capsule (50,000 Units total) by mouth every 7 (seven) days. Patient taking differently: Take 50,000 Units by mouth every 14 (fourteen) days. 02/25/19   Medina-Vargas, Monina C, NP      Allergies    Atorvastatin, Cortisone, Oxycodone, Paroxetine, Penicillins, Pravastatin, Singulair [montelukast], Sulfa antibiotics, Aleve [naproxen sodium], Augmentin [amoxicillin-pot clavulanate], and Prednisone    Review of Systems   Review of Systems   Constitutional:  Negative for chills and fever.  Respiratory:  Positive for cough. Negative for shortness of breath.   Cardiovascular:  Negative for chest pain, palpitations and leg swelling.  All other systems reviewed and are negative.   Physical Exam Updated Vital Signs BP 103/74 (BP Location: Left Arm)   Pulse 71   Temp 97.7 F (36.5 C) (Oral)   Resp (!) 26   Ht 5\' 6"  (1.676 m)   Wt 60.8 kg   LMP  (LMP Unknown)   SpO2 100%   BMI 21.63 kg/m  Physical Exam Vitals and nursing note reviewed.  Constitutional:      General: She is not in acute distress.    Appearance: Normal appearance. She is not ill-appearing.  HENT:     Head: Normocephalic and atraumatic.     Nose: Nose normal.  Eyes:     Conjunctiva/sclera: Conjunctivae normal.  Cardiovascular:     Rate and Rhythm: Normal rate and regular rhythm.  Pulmonary:     Effort: Pulmonary effort is normal. No respiratory distress.     Breath sounds: Normal breath sounds. No wheezing or rales.  Abdominal:     General: There is no distension.     Palpations: Abdomen is soft.     Tenderness: There is no abdominal tenderness. There is no guarding.  Musculoskeletal:        General: No deformity. Normal range of motion.     Cervical back: Normal range of motion.     Right lower leg: No edema.     Left lower leg: No edema.  Skin:    Findings: No rash.  Neurological:     Mental Status: She is alert.     ED Results / Procedures / Treatments   Labs (all labs ordered are listed, but only abnormal results are displayed) Labs Reviewed  RESP PANEL BY RT-PCR (RSV, FLU A&B, COVID)  RVPGX2 - Abnormal; Notable for the following components:      Result Value   SARS Coronavirus 2 by RT PCR POSITIVE (*)    All other components within normal limits  COMPREHENSIVE METABOLIC PANEL WITH GFR - Abnormal; Notable for the following components:   Glucose, Bld 106 (*)    Albumin 3.4 (*)    AST 52 (*)    All other components within normal  limits  CBC WITH DIFFERENTIAL/PLATELET - Abnormal; Notable for the following components:   Neutro Abs 1.4 (*)    All other components within normal limits    EKG None  Radiology DG Chest 2 View Result Date: 05/18/2023 CLINICAL DATA:  Shortness of breath. Patient reports diagnosed with pneumonia yesterday at urgent care. EXAM: CHEST - 2 VIEW COMPARISON:  Chest radiograph 06/12/2020 FINDINGS: The heart is normal in size. Stable mediastinal contours. Aortic atherosclerosis. Retrocardiac hiatal hernia. No focal airspace disease, pulmonary edema, pleural effusion or pneumothorax. The bones are subjectively under mineralized. Exaggerated thoracic kyphosis. Age indeterminate  compression deformity in the midthoracic spine. IMPRESSION: 1. No acute pulmonary process. 2. Retrocardiac hiatal hernia. 3. Age indeterminate compression deformity in the midthoracic spine. Electronically Signed   By: Narda Rutherford M.D.   On: 05/18/2023 15:20    Procedures Procedures    Medications Ordered in ED Medications - No data to display  ED Course/ Medical Decision Making/ A&P                                 Medical Decision Making Amount and/or Complexity of Data Reviewed Labs: ordered. Radiology: ordered.   Medical Decision Making / ED Course   This patient presents to the ED for concern of cough, this involves an extensive number of treatment options, and is a complaint that carries with it a high risk of complications and morbidity.  The differential diagnosis includes viral URI, pneumonia, PE, sinusitis  MDM: patient presents today for persistent cough.  She states she has been sick for 2 weeks and has not started to feel better.  Patient overall appears well.  Speaking in complete sentences without issue.  Hemodynamically stable.  CBC unremarkable, CMP without acute concerns.  Respiratory panel positive for COVID.  Chest x-ray without acute cardiopulmonary process.  Will start on  molnupiravir.  She can stop the Augmentin given it made her nauseous.  She will follow-up closely with her PCP in the next 2 to 3 days.  Discharged in stable condition.  Return precaution discussed.  Patient voices understanding and is in agreement with plan.   Additional history obtained: -Additional history obtained from husband, UC note -External records from outside source obtained and reviewed including: Chart review including previous notes, labs, imaging, consultation notes   Lab Tests: -I ordered, reviewed, and interpreted labs.   The pertinent results include:   Labs Reviewed  RESP PANEL BY RT-PCR (RSV, FLU A&B, COVID)  RVPGX2 - Abnormal; Notable for the following components:      Result Value   SARS Coronavirus 2 by RT PCR POSITIVE (*)    All other components within normal limits  COMPREHENSIVE METABOLIC PANEL WITH GFR - Abnormal; Notable for the following components:   Glucose, Bld 106 (*)    Albumin 3.4 (*)    AST 52 (*)    All other components within normal limits  CBC WITH DIFFERENTIAL/PLATELET - Abnormal; Notable for the following components:   Neutro Abs 1.4 (*)    All other components within normal limits      EKG  EKG Interpretation Date/Time:    Ventricular Rate:    PR Interval:    QRS Duration:    QT Interval:    QTC Calculation:   R Axis:      Text Interpretation:           Imaging Studies ordered: I ordered imaging studies including cxr I independently visualized and interpreted imaging. I agree with the radiologist interpretation   Medicines ordered and prescription drug management: Meds ordered this encounter  Medications   molnupiravir EUA (LAGEVRIO) 200 mg CAPS capsule    Sig: Take 4 capsules (800 mg total) by mouth 2 (two) times daily for 5 days.    Dispense:  40 capsule    Refill:  0    Supervising Provider:   Eber Hong [[id number]]    -I have reviewed the patients home medicines and have made adjustments as needed    Social  Determinants of Health:  Factors impacting patients care include: good follow up, good family support   Reevaluation: After the interventions noted above, I reevaluated the patient and found that they have :improved  Co morbidities that complicate the patient evaluation  Past Medical History:  Diagnosis Date   Allergic rhinitis    Chronic kidney disease    Depression    DJD (degenerative joint disease) of knee    GERD (gastroesophageal reflux disease)    Hyperlipidemia    Migraine    Takes Propranolol   Neuromuscular disorder (HCC)    Body tremors at times,h/o falling cast right arm   Osteoporosis    Tremors of nervous system    Vitamin D deficiency       Dispostion: Discharged in stable condition.  Return precaution discussed.  Patient voices understanding and is in agreement with plan.   Final Clinical Impression(s) / ED Diagnoses Final diagnoses:  COVID-19    Rx / DC Orders ED Discharge Orders          Ordered    molnupiravir EUA (LAGEVRIO) 200 mg CAPS capsule  2 times daily        05/18/23 1645              Marita Kansas, PA-C 05/18/23 1703    Benjiman Core, MD 05/18/23 2230

## 2023-07-11 ENCOUNTER — Other Ambulatory Visit: Payer: Self-pay | Admitting: Internal Medicine

## 2023-07-11 DIAGNOSIS — N6311 Unspecified lump in the right breast, upper outer quadrant: Secondary | ICD-10-CM

## 2023-07-11 DIAGNOSIS — R5381 Other malaise: Secondary | ICD-10-CM

## 2023-07-23 ENCOUNTER — Other Ambulatory Visit: Payer: Self-pay | Admitting: Internal Medicine

## 2023-07-23 ENCOUNTER — Other Ambulatory Visit (HOSPITAL_BASED_OUTPATIENT_CLINIC_OR_DEPARTMENT_OTHER): Payer: Self-pay | Admitting: Internal Medicine

## 2023-07-23 ENCOUNTER — Ambulatory Visit
Admission: RE | Admit: 2023-07-23 | Discharge: 2023-07-23 | Disposition: A | Discharge status: Home / Self Care | Source: Ambulatory Visit | Attending: Internal Medicine | Admitting: Internal Medicine

## 2023-07-23 DIAGNOSIS — N6311 Unspecified lump in the right breast, upper outer quadrant: Secondary | ICD-10-CM

## 2023-07-23 DIAGNOSIS — R921 Mammographic calcification found on diagnostic imaging of breast: Secondary | ICD-10-CM

## 2023-07-23 DIAGNOSIS — M810 Age-related osteoporosis without current pathological fracture: Secondary | ICD-10-CM

## 2024-01-26 ENCOUNTER — Other Ambulatory Visit: Payer: Self-pay | Admitting: Internal Medicine

## 2024-01-26 ENCOUNTER — Inpatient Hospital Stay: Admission: RE | Admit: 2024-01-26 | Discharge: 2024-01-26 | Attending: Internal Medicine | Admitting: Internal Medicine

## 2024-01-26 ENCOUNTER — Ambulatory Visit
Admission: RE | Admit: 2024-01-26 | Discharge: 2024-01-26 | Disposition: A | Source: Ambulatory Visit | Attending: Internal Medicine | Admitting: Internal Medicine

## 2024-01-26 DIAGNOSIS — R921 Mammographic calcification found on diagnostic imaging of breast: Secondary | ICD-10-CM

## 2024-02-16 ENCOUNTER — Ambulatory Visit (HOSPITAL_BASED_OUTPATIENT_CLINIC_OR_DEPARTMENT_OTHER)
Admission: RE | Admit: 2024-02-16 | Discharge: 2024-02-16 | Disposition: A | Discharge status: Home / Self Care | Source: Ambulatory Visit | Attending: Internal Medicine | Admitting: Internal Medicine

## 2024-02-16 DIAGNOSIS — M810 Age-related osteoporosis without current pathological fracture: Secondary | ICD-10-CM | POA: Insufficient documentation

## 2024-03-09 ENCOUNTER — Ambulatory Visit: Admitting: Family Medicine

## 2024-03-09 ENCOUNTER — Ambulatory Visit

## 2024-03-09 VITALS — BP 128/82 | HR 66 | Ht 66.0 in | Wt 134.0 lb

## 2024-03-09 DIAGNOSIS — N6312 Unspecified lump in the right breast, upper inner quadrant: Secondary | ICD-10-CM

## 2024-03-09 DIAGNOSIS — R0789 Other chest pain: Secondary | ICD-10-CM

## 2024-03-09 DIAGNOSIS — M810 Age-related osteoporosis without current pathological fracture: Secondary | ICD-10-CM | POA: Insufficient documentation

## 2024-03-09 DIAGNOSIS — E538 Deficiency of other specified B group vitamins: Secondary | ICD-10-CM | POA: Insufficient documentation

## 2024-03-09 DIAGNOSIS — Z87310 Personal history of (healed) osteoporosis fracture: Secondary | ICD-10-CM

## 2024-03-09 NOTE — Progress Notes (Signed)
 "        I, Ileana Collet, PhD, LAT, ATC acting as a scribe for Artist Lloyd, MD.  Jennifer Estrada is a 79 y.o. female who presents to [facility] today for osteoporosis management.  Pt also has some concerns about her mamogram results and a anonomous phone call she suspects from [facility].   The most important thing that Aryahi would like to discuss today is pain in the right anterior chest wall associated with a small bump on her skin superficial to her right breast.  She has had a diagnostic mammogram and ultrasound performed at [facility] in December.  This fortunately was reassuring.  However she received an anonymous phone call saying that she really should have a second look by a different organization and does not want to go back to [facility].   DEXA scan (date, T-score): 02/16/24: Spine= -4.0, L-TH= -3.5, R-FNK= -3.7 Prior treatment: [facility]- March 2010,-12 History of Hip, Spine, or Wrist Fx: R wrist/forearm fx, L hip fx (Dec 2020), patellar fx Heart disease or stroke: no Cancer: unsure Kidney Disease: yes Gastric/Peptic Ulcer: no Gastric bypass surgery: no Severe GERD: yes Hx of seizures: no Age at Menopause: late 2's Calcium  intake: no Vitamin D  intake: yes Hormone replacement therapy: no Smoking history: no Alcohol : none Exercise: none Major dental work in past year: yes Parents with hip/spine fracture: father- hip fx Height loss: none   Pertinent review of systems: No fevers or chills  Relevant historical information: Severe osteoporosis.  History of left hip fracture, and wrist fracture.   Exam:  BP 128/82   Pulse 66   Ht 5' 6 (1.676 m)   Wt 134 lb (60.8 kg)   LMP  (LMP Unknown)   SpO2 97%   BMI 21.63 kg/m  General: Well Developed, well nourished, and in no acute distress.   MSK: Right chest wall: No palpable masses.  Tender to palpation around costochondral joint right superior chest wall.  Normal shoulder motion.    Lab and Radiology  Results  Chest x-ray images obtained today personally and independently interpreted. No displaced rib fracture visible.  No intrathoracic acute abnormality visible. Await formal radiology review    Assessment and Plan: 79 y.o. female with right chest wall pain.  Muscle dysfunction or costochondral irritation is the most likely explanation.  She would like a second opinion diagnostic mammogram and ultrasound.  I think this is reasonable especially if she did receive an anonymous phone call from somebody possibly at [facility] saying that she needs another look.  Will go ahead and order a repeat diagnostic mammogram through [facility].  Once we get these results back we will schedule a return visit to talk more about what she can do about her chest wall pain.  Additionally I would like a dedicated visit to talk about the reason she was referred to me which was osteoporosis.  She has severe osteoporosis with history of fracture and is not currently on treatment.  She did not want to spend time talking about osteoporosis today so we will have to set up a dedicated visit for that.   PDMP not reviewed this encounter. Orders Placed This Encounter  Procedures   MM 3D DIAGNOSTIC MAMMOGRAM BILATERAL BREAST    Standing Status:   Future    Expiration Date:   03/09/2025    Scheduling Instructions:     [facility]    Reason for Exam (SYMPTOM  OR DIAGNOSIS REQUIRED):   chest  wall pain / breast mass    Preferred imaging location?:   External   DG Chest 2 View    Standing Status:   Future    Number of Occurrences:   1    Expiration Date:   04/09/2024    Reason for Exam (SYMPTOM  OR DIAGNOSIS REQUIRED):   chest wall pain    Preferred imaging location?:   [facility]   No orders of the defined types were placed in this encounter.    Discussed warning signs or symptoms. Please see discharge instructions. Patient expresses understanding.   The above documentation has been reviewed and  is accurate and complete Artist Lloyd, M.D.   "

## 2024-03-09 NOTE — Patient Instructions (Addendum)
 Thank you for coming in today.   Please get an Xray today before you leave   I've ordered a mammogram to [facility]. You should hear soon about scheduling.  Once we get the mammogram results back, let's schedule a follow up visit

## 2024-03-12 ENCOUNTER — Ambulatory Visit: Payer: Self-pay | Admitting: Family Medicine

## 2024-03-12 NOTE — Progress Notes (Signed)
Chest x-ray shows no acute findings.

## 2024-03-16 NOTE — Telephone Encounter (Signed)
Please call patient to discuss further.
